# Patient Record
Sex: Male | Born: 1945
Health system: Southern US, Community
[De-identification: ages and names within clinical notes are randomized; demographics above are authoritative.]

## PROBLEM LIST (undated history)

## (undated) DIAGNOSIS — H409 Unspecified glaucoma: Secondary | ICD-10-CM

## (undated) DIAGNOSIS — E139 Other specified diabetes mellitus without complications: Secondary | ICD-10-CM

## (undated) DIAGNOSIS — K5790 Diverticulosis of intestine, part unspecified, without perforation or abscess without bleeding: Secondary | ICD-10-CM

## (undated) DIAGNOSIS — C4491 Basal cell carcinoma of skin, unspecified: Secondary | ICD-10-CM

## (undated) DIAGNOSIS — M1990 Unspecified osteoarthritis, unspecified site: Secondary | ICD-10-CM

## (undated) DIAGNOSIS — E785 Hyperlipidemia, unspecified: Secondary | ICD-10-CM

## (undated) DIAGNOSIS — K219 Gastro-esophageal reflux disease without esophagitis: Secondary | ICD-10-CM

## (undated) DIAGNOSIS — H11003 Unspecified pterygium of eye, bilateral: Secondary | ICD-10-CM

## (undated) DIAGNOSIS — I4891 Unspecified atrial fibrillation: Secondary | ICD-10-CM

## (undated) DIAGNOSIS — I739 Peripheral vascular disease, unspecified: Secondary | ICD-10-CM

## (undated) DIAGNOSIS — N400 Enlarged prostate without lower urinary tract symptoms: Secondary | ICD-10-CM

## (undated) DIAGNOSIS — M2041 Other hammer toe(s) (acquired), right foot: Secondary | ICD-10-CM

## (undated) DIAGNOSIS — I2510 Atherosclerotic heart disease of native coronary artery without angina pectoris: Secondary | ICD-10-CM

## (undated) DIAGNOSIS — K469 Unspecified abdominal hernia without obstruction or gangrene: Secondary | ICD-10-CM

## (undated) DIAGNOSIS — M2042 Other hammer toe(s) (acquired), left foot: Secondary | ICD-10-CM

## (undated) DIAGNOSIS — J449 Chronic obstructive pulmonary disease, unspecified: Secondary | ICD-10-CM

## (undated) DIAGNOSIS — I10 Essential (primary) hypertension: Secondary | ICD-10-CM

## (undated) DIAGNOSIS — D571 Sickle-cell disease without crisis: Secondary | ICD-10-CM

## (undated) DIAGNOSIS — E119 Type 2 diabetes mellitus without complications: Secondary | ICD-10-CM

## (undated) DIAGNOSIS — T7840XA Allergy, unspecified, initial encounter: Secondary | ICD-10-CM

## (undated) HISTORY — DX: Allergy, unspecified, initial encounter: T78.40XA

## (undated) HISTORY — DX: Unspecified abdominal hernia without obstruction or gangrene: K46.9

## (undated) HISTORY — DX: Hyperlipidemia, unspecified: E78.5

## (undated) HISTORY — PX: CARDIAC CATHETERIZATION: SHX172

## (undated) HISTORY — DX: Unspecified atrial fibrillation: I48.91

## (undated) HISTORY — DX: Other specified diabetes mellitus without complications: E13.9

## (undated) HISTORY — DX: Other hammer toe(s) (acquired), right foot: M20.41

## (undated) HISTORY — DX: Atherosclerotic heart disease of native coronary artery without angina pectoris: I25.10

## (undated) HISTORY — DX: Diverticulosis of intestine, part unspecified, without perforation or abscess without bleeding: K57.90

## (undated) HISTORY — DX: Benign prostatic hyperplasia without lower urinary tract symptoms: N40.0

## (undated) HISTORY — DX: Basal cell carcinoma of skin, unspecified: C44.91

## (undated) HISTORY — DX: Unspecified pterygium of eye, bilateral: H11.003

## (undated) HISTORY — DX: Sickle-cell disease without crisis: D57.1

## (undated) HISTORY — DX: Unspecified glaucoma: H40.9

## (undated) HISTORY — DX: Type 2 diabetes mellitus without complications: E11.9

## (undated) HISTORY — DX: Peripheral vascular disease, unspecified: I73.9

## (undated) HISTORY — DX: Essential (primary) hypertension: I10

## (undated) HISTORY — DX: Gastro-esophageal reflux disease without esophagitis: K21.9

## (undated) HISTORY — DX: Unspecified osteoarthritis, unspecified site: M19.90

## (undated) HISTORY — DX: Other hammer toe(s) (acquired), right foot: M20.42

## (undated) HISTORY — DX: Chronic obstructive pulmonary disease, unspecified: J44.9

---

## 2008-07-24 HISTORY — PX: INGUINAL HERNIA REPAIR: SHX194

## 2010-12-05 HISTORY — PX: CARDIAC CATHETERIZATION: SHX172

## 2011-10-11 ENCOUNTER — Encounter: Payer: Self-pay | Admitting: Cardiology

## 2012-07-12 ENCOUNTER — Encounter: Payer: Self-pay | Admitting: Cardiology

## 2012-07-12 ENCOUNTER — Ambulatory Visit (INDEPENDENT_AMBULATORY_CARE_PROVIDER_SITE_OTHER): Payer: BC Managed Care – PPO | Admitting: Cardiology

## 2012-07-12 VITALS — BP 169/78 | HR 59 | Ht 65.0 in | Wt 142.8 lb

## 2012-07-12 DIAGNOSIS — I2510 Atherosclerotic heart disease of native coronary artery without angina pectoris: Secondary | ICD-10-CM

## 2012-07-12 DIAGNOSIS — I10 Essential (primary) hypertension: Secondary | ICD-10-CM

## 2012-07-12 DIAGNOSIS — E785 Hyperlipidemia, unspecified: Secondary | ICD-10-CM

## 2012-07-12 MED ORDER — HYDROCHLOROTHIAZIDE 12.5 MG PO CAPS: 25.0000 mg | ORAL_CAPSULE | Freq: Every day | ORAL | Status: DC

## 2012-07-12 NOTE — Progress Notes (Signed)
HPI The patient presents as a new patient. He has moved from [place] originally but was living in [place] prior to this. He does have a history of coronary disease having undergone angioplasty earlier this year. I was able to review some of these records. He does not recall having symptoms. I am not exactly sure how he presented initially. I do note that he was found on catheterization to have an occluded right coronary artery and treated with high-grade LAD stenosis treated with a Promus stent.  He was also apparently found to have some peripheral vascular disease though I don't have these records. He has moved to [place].  He says that with activities he denies any symptoms. He has no chest discomfort, neck or arm discomfort. He has no palpitations, presyncope or syncope. He has no shortness of breath, PND or orthopnea. He has no weight gain or edema. He does have some leg pain when he ambulates a moderate distance.  No Known Allergies  Current Outpatient Prescriptions  Medication Sig Dispense Refill  . ASPIRIN CR PO Take 81 mg by mouth daily.       Marland Kitchen atorvastatin (LIPITOR) 20 MG tablet Take 20 mg by mouth daily.      . cetirizine (ZYRTEC) 10 MG tablet Take 10 mg by mouth daily.      . hydrochlorothiazide (MICROZIDE) 12.5 MG capsule Take 2 capsules (25 mg total) by mouth daily.  180 capsule  3  . losartan (COZAAR) 100 MG tablet Take 100 mg by mouth daily.      . metFORMIN (GLUCOPHAGE) 1000 MG tablet Take 1,000 mg by mouth 2 (two) times daily with a meal.      . Tamsulosin HCl (FLOMAX) 0.4 MG CAPS Take 0.4 mg by mouth daily after supper.      . Clopidogrel Bisulfate (PLAVIX PO) Take 75 mg by mouth daily.         Past Medical History  Diagnosis Date  . Coronary artery disease   . Diabetes mellitus without complication   . Dyslipidemia   . Hypertension   . Peripheral vascular disease   . Hernia   . Chest pain     Past Surgical History  Procedure Date  . Cardiac  catheterization   . Inguinal hernia repair     Right    No family history on file.  History   Social History  . Marital Status: Married    Spouse Name: N/A    Number of Children: 6  . Years of Education: N/A   Occupational History  . Not on file.   Social History Main Topics  . Smoking status: Former Smoker    Types: Cigarettes  . Smokeless tobacco: Not on file     Comment: Quit tobacco 2 years ago.    . Alcohol Use: Not on file  . Drug Use: Not on file  . Sexually Active: Not on file   Other Topics Concern  . Not on file   Social History Narrative   Five children living.  Lives with wife.      ROS:   Positive for rare nosebleed. Negative for all other systems.  PHYSICAL EXAM BP 169/78  Pulse 59  Ht 5\' 5"  (1.651 m)  Wt 142 lb 12.8 oz (64.774 kg)  BMI 23.76 kg/m2 GENERAL:  Well appearing HEENT:  Pupils equal round and reactive, fundi not visualized, oral mucosa unremarkable NECK:  No jugular venous distention, waveform within normal limits, carotid upstroke brisk and symmetric,  no bruits, no thyromegaly LYMPHATICS:  No cervical, inguinal adenopathy LUNGS:  Clear to auscultation bilaterally BACK:  No CVA tenderness CHEST:  Unremarkable HEART:  PMI not displaced or sustained,S1 and S2 within normal limits, no S3, no S4, no clicks, no rubs, no murmurs ABD:  Flat, positive bowel sounds normal in frequency in pitch, no bruits, no rebound, no guarding, no midline pulsatile mass, no hepatomegaly, no splenomegaly EXT:  2 plus pulses upper, mildly diminished dorsalis pedis and posterior tibialis bilaterally, no edema, no cyanosis no clubbing SKIN:  No rashes no nodules NEURO:  Cranial nerves II through XII grossly intact, motor grossly intact throughout PSYCH:  Cognitively intact, oriented to person place and time   EKG:  Sinus bradycardia, rate 59, sinus arrhythmia, but ventricular hypertrophy by voltage criteria, no acute ST-T wave changes. 07/12/2012  ASSESSMENT  AND PLAN  CAD  The patient is not having active symptoms. No further cardiovascular testing is suggested. He will continue with aggressive risk reduction.  Dyslipidemia He will come back in about 10 days for a lipid profile with a goal LDL less than 100 and HDL greater than 40.  HTN I will increase his hydrochlorothiazide to 25 mg daily. He will get a basic metabolic profile in 10 days.  PVD I don't know the details of this and I will first prescribe a walking regimen.

## 2012-07-12 NOTE — Patient Instructions (Addendum)
Please increase your Hydrochlorothiazide to 25 mg a day (2 a day) Continue all other medications as listed.  Please return fasting in 10 days for blood work (lipid and BMP)  Follow in 2 months with Dr Antoine Poche.

## 2012-07-22 ENCOUNTER — Other Ambulatory Visit (INDEPENDENT_AMBULATORY_CARE_PROVIDER_SITE_OTHER): Payer: BC Managed Care – PPO

## 2012-07-22 DIAGNOSIS — E785 Hyperlipidemia, unspecified: Secondary | ICD-10-CM

## 2012-07-22 DIAGNOSIS — I2510 Atherosclerotic heart disease of native coronary artery without angina pectoris: Secondary | ICD-10-CM

## 2012-07-22 DIAGNOSIS — I10 Essential (primary) hypertension: Secondary | ICD-10-CM

## 2012-07-22 LAB — BASIC METABOLIC PANEL
BUN: 16 mg/dL (ref 6–23)
CO2: 27 mEq/L (ref 19–32)
Calcium: 9.5 mg/dL (ref 8.4–10.5)
Creatinine, Ser: 1.6 mg/dL — ABNORMAL HIGH (ref 0.4–1.5)
Glucose, Bld: 192 mg/dL — ABNORMAL HIGH (ref 70–99)

## 2012-07-22 LAB — LIPID PANEL
LDL Cholesterol: 81 mg/dL (ref 0–99)
Total CHOL/HDL Ratio: 3

## 2012-07-29 ENCOUNTER — Encounter: Payer: Self-pay | Admitting: Cardiology

## 2012-08-08 ENCOUNTER — Ambulatory Visit (INDEPENDENT_AMBULATORY_CARE_PROVIDER_SITE_OTHER): Payer: BC Managed Care – PPO | Admitting: Internal Medicine

## 2012-08-08 ENCOUNTER — Encounter: Payer: Self-pay | Admitting: Internal Medicine

## 2012-08-08 ENCOUNTER — Other Ambulatory Visit (INDEPENDENT_AMBULATORY_CARE_PROVIDER_SITE_OTHER): Payer: BC Managed Care – PPO

## 2012-08-08 VITALS — BP 140/72 | HR 80 | Temp 97.8°F | Resp 16 | Ht 65.0 in | Wt 140.0 lb

## 2012-08-08 DIAGNOSIS — Z0000 Encounter for general adult medical examination without abnormal findings: Secondary | ICD-10-CM | POA: Insufficient documentation

## 2012-08-08 DIAGNOSIS — I10 Essential (primary) hypertension: Secondary | ICD-10-CM

## 2012-08-08 DIAGNOSIS — I2510 Atherosclerotic heart disease of native coronary artery without angina pectoris: Secondary | ICD-10-CM

## 2012-08-08 DIAGNOSIS — E1129 Type 2 diabetes mellitus with other diabetic kidney complication: Secondary | ICD-10-CM

## 2012-08-08 DIAGNOSIS — E1165 Type 2 diabetes mellitus with hyperglycemia: Secondary | ICD-10-CM

## 2012-08-08 DIAGNOSIS — E139 Other specified diabetes mellitus without complications: Secondary | ICD-10-CM | POA: Insufficient documentation

## 2012-08-08 LAB — URINALYSIS, ROUTINE W REFLEX MICROSCOPIC
Ketones, ur: NEGATIVE
Specific Gravity, Urine: 1.01 (ref 1.000–1.030)
Total Protein, Urine: NEGATIVE
Urine Glucose: NEGATIVE
pH: 6 (ref 5.0–8.0)

## 2012-08-08 LAB — CBC WITH DIFFERENTIAL/PLATELET
Basophils Relative: 0.4 % (ref 0.0–3.0)
Eosinophils Absolute: 0.3 10*3/uL (ref 0.0–0.7)
Hemoglobin: 14.1 g/dL (ref 13.0–17.0)
MCHC: 33.6 g/dL (ref 30.0–36.0)
MCV: 87.2 fl (ref 78.0–100.0)
Monocytes Absolute: 0.5 10*3/uL (ref 0.1–1.0)
Neutro Abs: 3 10*3/uL (ref 1.4–7.7)
Neutrophils Relative %: 52.3 % (ref 43.0–77.0)
RBC: 4.81 Mil/uL (ref 4.22–5.81)

## 2012-08-08 LAB — COMPREHENSIVE METABOLIC PANEL
AST: 27 U/L (ref 0–37)
Alkaline Phosphatase: 74 U/L (ref 39–117)
BUN: 22 mg/dL (ref 6–23)
Creatinine, Ser: 1.4 mg/dL (ref 0.4–1.5)
Total Bilirubin: 0.8 mg/dL (ref 0.3–1.2)

## 2012-08-08 LAB — MICROALBUMIN / CREATININE URINE RATIO
Microalb Creat Ratio: 4.9 mg/g (ref 0.0–30.0)
Microalb, Ur: 5.2 mg/dL — ABNORMAL HIGH (ref 0.0–1.9)

## 2012-08-08 LAB — PSA: PSA: 3.27 ng/mL (ref 0.10–4.00)

## 2012-08-08 NOTE — Progress Notes (Signed)
Subjective:    Patient ID: Steven [name], male    DOB: 08-Nov-1945, 67 y.o.   MRN: [id number]  Diabetes He presents for his follow-up diabetic visit. He has type 2 diabetes mellitus. His disease course has been stable. There are no hypoglycemic associated symptoms. Pertinent negatives for hypoglycemia include no dizziness or pallor. Associated symptoms include polydipsia, polyphagia and polyuria. Pertinent negatives for diabetes include no blurred vision, no chest pain, no fatigue, no foot paresthesias, no foot ulcerations, no visual change, no weakness and no weight loss. There are no hypoglycemic complications. Symptoms are worsening. Diabetic complications include heart disease. Current diabetic treatment includes oral agent (monotherapy). He is compliant with treatment most of the time. His weight is stable. He is following a generally healthy diet. Meal planning includes avoidance of concentrated sweets. He has not had a previous visit with a dietician. He participates in exercise intermittently. There is no change in his home blood glucose trend. An ACE inhibitor/angiotensin II receptor blocker is being taken. He does not see a podiatrist.Eye exam is not current.      Review of Systems  Constitutional: Negative for fever, chills, weight loss, diaphoresis, activity change, appetite change, fatigue and unexpected weight change.  HENT: Negative.   Eyes: Negative.  Negative for blurred vision.  Respiratory: Negative for apnea, cough, choking, chest tightness, shortness of breath, wheezing and stridor.   Cardiovascular: Negative for chest pain, palpitations and leg swelling.  Gastrointestinal: Negative for nausea, vomiting, abdominal pain, diarrhea, constipation and blood in stool.  Genitourinary: Positive for polyuria. Negative for dysuria, urgency, frequency, hematuria, flank pain, decreased urine volume, penile swelling, scrotal swelling, enuresis, difficulty urinating and testicular pain.    Musculoskeletal: Negative for myalgias, back pain, joint swelling, arthralgias and gait problem.  Skin: Negative for color change, pallor, rash and wound.  Neurological: Negative.  Negative for dizziness, weakness and light-headedness.  Hematological: Positive for polydipsia and polyphagia. Negative for adenopathy. Does not bruise/bleed easily.  Psychiatric/Behavioral: Negative.        Objective:   Physical Exam  Vitals reviewed. Constitutional: He is oriented to person, place, and time. He appears well-developed and well-nourished.  Non-toxic appearance. He does not have a sickly appearance. He does not appear ill. No distress.  HENT:  Head: Normocephalic and atraumatic.  Mouth/Throat: Oropharynx is clear and moist. No oropharyngeal exudate.  Eyes: Conjunctivae normal are normal. Right eye exhibits no discharge. Left eye exhibits no discharge. No scleral icterus.  Neck: Normal range of motion. Neck supple. No JVD present. No tracheal deviation present. No thyromegaly present.  Cardiovascular: Normal rate, regular rhythm, normal heart sounds and intact distal pulses.  Exam reveals no gallop and no friction rub.   No murmur heard. Pulmonary/Chest: Effort normal and breath sounds normal. No stridor. No respiratory distress. He has no wheezes. He has no rales. He exhibits no tenderness.  Abdominal: Soft. Bowel sounds are normal. He exhibits no distension and no mass. There is no tenderness. There is no rebound and no guarding. Hernia confirmed negative in the right inguinal area and confirmed negative in the left inguinal area.  Genitourinary: Testes normal and penis normal. Rectal exam shows external hemorrhoid. Rectal exam shows no internal hemorrhoid, no fissure, no mass, no tenderness and anal tone normal. Guaiac negative stool. 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 Prostate is not tender. Right testis shows no mass, no swelling and no tenderness. Right testis is descended. Left  testis shows no mass, no swelling and no tenderness. Left testis is  descended. Circumcised. No penile erythema or penile tenderness. No discharge found.  Musculoskeletal: Normal range of motion. He exhibits no edema and no tenderness.  Lymphadenopathy:    He has no cervical adenopathy.       Right: No inguinal adenopathy present.       Left: No inguinal adenopathy present.  Neurological: He is oriented to person, place, and time.  Skin: Skin is warm and dry. No rash noted. He is not diaphoretic. No erythema. No pallor.  Psychiatric: He has a normal mood and affect. His behavior is normal. Judgment and thought content normal.     Lab Results  Component Value Date   GLUCOSE 192* 07/22/2012   CHOL 177 07/22/2012   TRIG 164.0* 07/22/2012   HDL 62.90 07/22/2012   LDLCALC 81 07/22/2012   NA 138 07/22/2012   K 3.9 07/22/2012   CL 101 07/22/2012   CREATININE 1.6* 07/22/2012   BUN 16 07/22/2012   CO2 27 07/22/2012       Assessment & Plan:

## 2012-08-08 NOTE — Assessment & Plan Note (Signed)
His BP is well controlled I will check his lytes and renal function 

## 2012-08-08 NOTE — Patient Instructions (Signed)
Health Maintenance, Males A healthy lifestyle and preventative care can promote health and wellness.  Maintain regular health, dental, and eye exams.  Eat a healthy diet. Foods like vegetables, fruits, whole grains, low-fat dairy products, and lean protein foods contain the nutrients you need without too many calories. Decrease your intake of foods high in solid fats, added sugars, and salt. Get information about a proper diet from your caregiver, if necessary.  Regular physical exercise is one of the most important things you can do for your health. Most adults should get at least 150 minutes of moderate-intensity exercise (any activity that increases your heart rate and causes you to sweat) each week. In addition, most adults need muscle-strengthening exercises on 2 or more days a week.   Maintain a healthy weight. The body mass index (BMI) is a screening tool to identify possible weight problems. It provides an estimate of body fat based on height and weight. Your caregiver can help determine your BMI, and can help you achieve or maintain a healthy weight. For adults 20 years and older:  A BMI below 18.5 is considered underweight.  A BMI of 18.5 to 24.9 is normal.  A BMI of 25 to 29.9 is considered overweight.  A BMI of 30 and above is considered obese.  Maintain normal blood lipids and cholesterol by exercising and minimizing your intake of saturated fat. Eat a balanced diet with plenty of fruits and vegetables. Blood tests for lipids and cholesterol should begin at age 20 and be repeated every 5 years. If your lipid or cholesterol levels are high, you are over 50, or you are a high risk for heart disease, you may need your cholesterol levels checked more frequently.Ongoing high lipid and cholesterol levels should be treated with medicines, if diet and exercise are not effective.  If you smoke, find out from your caregiver how to quit. If you do not use tobacco, do not start.  If you  choose to drink alcohol, do not exceed 2 drinks per day. One drink is considered to be 12 ounces (355 mL) of beer, 5 ounces (148 mL) of wine, or 1.5 ounces (44 mL) of liquor.  Avoid use of street drugs. Do not share needles with anyone. Ask for help if you need support or instructions about stopping the use of drugs.  High blood pressure causes heart disease and increases the risk of stroke. Blood pressure should be checked at least every 1 to 2 years. Ongoing high blood pressure should be treated with medicines if weight loss and exercise are not effective.  If you are 45 to 65 years old, ask your caregiver if you should take aspirin to prevent heart disease.  Diabetes screening involves taking a blood sample to check your fasting blood sugar level. This should be done once every 3 years, after age 45, if you are within normal weight and without risk factors for diabetes. Testing should be considered at a younger age or be carried out more frequently if you are overweight and have at least 1 risk factor for diabetes.  Colorectal cancer can be detected and often prevented. Most routine colorectal cancer screening begins at the age of 50 and continues through age 75. However, your caregiver may recommend screening at an earlier age if you have risk factors for colon cancer. On a yearly basis, your caregiver may provide home test kits to check for hidden blood in the stool. Use of a small camera at the end of a tube,   to directly examine the colon (sigmoidoscopy or colonoscopy), can detect the earliest forms of colorectal cancer. Talk to your caregiver about this at age 50, when routine screening begins. Direct examination of the colon should be repeated every 5 to 10 years through age 75, unless early forms of pre-cancerous polyps or small growths are found.  Hepatitis C blood testing is recommended for all people born from 1945 through 1965 and any individual with known risks for hepatitis C.  Healthy  men should no longer receive prostate-specific antigen (PSA) blood tests as part of routine cancer screening. Consult with your caregiver about prostate cancer screening.  Testicular cancer screening is not recommended for adolescents or adult males who have no symptoms. Screening includes self-exam, caregiver exam, and other screening tests. Consult with your caregiver about any symptoms you have or any concerns you have about testicular cancer.  Practice safe sex. Use condoms and avoid high-risk sexual practices to reduce the spread of sexually transmitted infections (STIs).  Use sunscreen with a sun protection factor (SPF) of 30 or greater. Apply sunscreen liberally and repeatedly throughout the day. You should seek shade when your shadow is shorter than you. Protect yourself by wearing long sleeves, pants, a wide-brimmed hat, and sunglasses year round, whenever you are outdoors.  Notify your caregiver of new moles or changes in moles, especially if there is a change in shape or color. Also notify your caregiver if a mole is larger than the size of a pencil eraser.  A one-time screening for abdominal aortic aneurysm (AAA) and surgical repair of large AAAs by sound wave imaging (ultrasonography) is recommended for ages 65 to 75 years who are current or former smokers.  Stay current with your immunizations. Document Released: 01/06/2008 Document Revised: 10/02/2011 Document Reviewed: 12/05/2010 [employer] Patient Information 2013 [employer]. Diabetes, Type 2 Diabetes is a long-lasting (chronic) disease. In type 2 diabetes, the pancreas does not make enough insulin (a hormone), and the body does not respond normally to the insulin that is made. This type of diabetes was also previously called adult-onset diabetes. It usually occurs after the age of 40, but it can occur at any age.  CAUSES  Type 2 diabetes happens because the pancreasis not making enough insulin or your body has trouble using  the insulin that your pancreas does make properly. SYMPTOMS   Drinking more than usual.  Urinating more than usual.  Blurred vision.  Dry, itchy skin.  Frequent infections.  Feeling more tired than usual (fatigue). DIAGNOSIS The diagnosis of type 2 diabetes is usually made by one of the following tests:  Fasting blood glucose test. You will not eat for at least 8 hours and then take a blood test.  Random blood glucose test. Your blood glucose (sugar) is checked at any time of the day regardless of when you ate.  Oral glucose tolerance test (OGTT). Your blood glucose is measured after you have not eaten (fasted) and then after you drink a glucose containing beverage. TREATMENT   Healthy eating.  Exercise.  Medicine, if needed.  Monitoring blood glucose.  Seeing your caregiver regularly. HOME CARE INSTRUCTIONS   Check your blood glucose at least once a day. More frequent monitoring may be necessary, depending on your medicines and on how well your diabetes is controlled. Your caregiver will advise you.  Take your medicine as directed by your caregiver.  Do not smoke.  Make wise food choices. Ask your caregiver for information. Weight loss can improve your diabetes.    Learn about low blood glucose (hypoglycemia) and how to treat it.  Get your eyes checked regularly.  Have a yearly physical exam. Have your blood pressure checked and your blood and urine tested.  Wear a pendant or bracelet saying that you have diabetes.  Check your feet every night for cuts, sores, blisters, and redness. Let your caregiver know if you have any problems. SEEK MEDICAL CARE IF:   You have problems keeping your blood glucose in target range.  You have problems with your medicines.  You have symptoms of an illness that do not improve after 24 hours.  You have a sore or wound that is not healing.  You notice a change in vision or a new problem with your vision.  You have a  fever. MAKE SURE YOU:  Understand these instructions.  Will watch your condition.  Will get help right away if you are not doing well or get worse. Document Released: 07/10/2005 Document Revised: 10/02/2011 Document Reviewed: 12/26/2010 [employer] Patient Information 2013 [employer].  

## 2012-08-08 NOTE — Assessment & Plan Note (Signed)
I will check his a1c and c-peptide and will make changes if needed He was referred for an eye exam

## 2012-08-08 NOTE — Assessment & Plan Note (Addendum)
Exam done Vaccines were updated Labs ordered Pt ed material was given He was referred for a screening colonoscopy

## 2012-08-09 ENCOUNTER — Encounter: Payer: Self-pay | Admitting: Internal Medicine

## 2012-08-09 LAB — C-PEPTIDE: C-Peptide: 0.92 ng/mL (ref 0.80–3.90)

## 2012-08-14 ENCOUNTER — Ambulatory Visit (INDEPENDENT_AMBULATORY_CARE_PROVIDER_SITE_OTHER): Payer: BC Managed Care – PPO | Admitting: Internal Medicine

## 2012-08-14 ENCOUNTER — Encounter: Payer: Self-pay | Admitting: Internal Medicine

## 2012-08-14 VITALS — BP 142/58 | HR 70 | Temp 97.2°F | Resp 16 | Wt 145.5 lb

## 2012-08-14 DIAGNOSIS — E139 Other specified diabetes mellitus without complications: Secondary | ICD-10-CM

## 2012-08-14 DIAGNOSIS — E119 Type 2 diabetes mellitus without complications: Secondary | ICD-10-CM

## 2012-08-14 DIAGNOSIS — I10 Essential (primary) hypertension: Secondary | ICD-10-CM

## 2012-08-14 LAB — HM DIABETES FOOT EXAM: HM Diabetic Foot Exam: NORMAL

## 2012-08-14 MED ORDER — INSULIN PEN NEEDLE 32G X 4 MM MISC: 1.0000 | Freq: Every day | Status: DC

## 2012-08-14 MED ORDER — GLUCOSE BLOOD VI STRP: ORAL_STRIP | Status: DC

## 2012-08-14 MED ORDER — INSULIN DETEMIR 100 UNIT/ML ~~LOC~~ SOLN: 40.0000 [IU] | Freq: Every day | SUBCUTANEOUS | Status: DC

## 2012-08-14 NOTE — Patient Instructions (Signed)
Diabetes, Type 1 Diabetes is a long-term (chronic) disease. It occurs when the cells in the pancreas that make insulin (a hormone) are destroyed and can no longer make insulin. Type 1 diabetes was also previously called juvenile-onset diabetes. It most often occurs before the age of 30, but it can also occur in older people. CAUSES  Among other factors, the following may cause type 1 diabetes:  Genetics. This means it may be passed to you by your parents.  The beta cells that make insulin are destroyed. The cause of this is unknown. SYMPTOMS   Urinating more than usual (or bed-wetting in children).  Drinking more than usual.  Irritability.  Feeling very hungry.  Weight loss (may be rapid).  Nausea and vomiting.  Abdominal pain.  Feeling more tired than usual (fatigue).  Rapid breathing.  Difficulty staying awake.  Night sweats. DIAGNOSIS  Your blood is tested to determine whether you have type 1 diabetes. TREATMENT   You will need to check your blood glucose (sugar) levels several times a day.  You will need to balance insulin, a healthy meal plan, and exercise to maintain normal blood glucose.  Education and ongoing support is recommended.  You should have regular checkups and immunizations. HOME CARE INSTRUCTIONS   Never run out of insulin. It is needed every day.  Do not skip insulin doses.  Do not skip meals. Eat healthy.  Follow your treatment and monitoring plan.  Wear a pendant or bracelet stating you have diabetes and take insulin.  If you start a new exercise or sport, watch for low blood glucose (hypoglycemia) symptoms. Insulin dosing may need to be adjusted.  Follow up with your caregiver regularly.  Tell your workplace or school about your diabetes treatment plan. SEEK MEDICAL CARE IF:   You have problems keeping your blood glucose in target range.  You have blood glucose readings that are often too high or too low.  You have problems with  your medicines.  You have symptoms of an illness that do not improve after 24 hours.  You have a sore or wound that is not healing.  You notice a change in vision or a new problem with your vision.  You have a fever. MAKE SURE YOU:  Understand these instructions.  Will watch your condition.  Will get help right away if you are not doing well or get worse. Document Released: 07/07/2000 Document Revised: 10/02/2011 Document Reviewed: 12/26/2010 [employer] Patient Information 2013 [employer].  

## 2012-08-14 NOTE — Progress Notes (Signed)
Subjective:    Patient ID: Steven [name], male    DOB: [birth year], 67 y.o.   MRN: [id number]  Diabetes He presents for his follow-up diabetic visit. He has type 1 diabetes mellitus. There are no hypoglycemic associated symptoms. Pertinent negatives for hypoglycemia include no dizziness. Associated symptoms include polydipsia, polyphagia and polyuria. Pertinent negatives for diabetes include no blurred vision, no chest pain, no fatigue, no foot paresthesias, no foot ulcerations, no visual change, no weakness and no weight loss. There are no hypoglycemic complications. Symptoms are stable. There are no diabetic complications. Current diabetic treatment includes oral agent (monotherapy). He is compliant with treatment most of the time. His weight is stable. He is following a generally unhealthy diet. When asked about meal planning, he reported none. He participates in exercise intermittently. His home blood glucose trend is increasing steadily. His breakfast blood glucose range is generally 180-200 mg/dl. His lunch blood glucose range is generally >200 mg/dl. His dinner blood glucose range is generally >200 mg/dl. His highest blood glucose is >200 mg/dl. His overall blood glucose range is >200 mg/dl. An ACE inhibitor/angiotensin II receptor blocker is being taken. He does not see a podiatrist.Eye exam is not current.      Review of Systems  Constitutional: Negative.  Negative for weight loss and fatigue.  HENT: Negative.   Eyes: Negative.  Negative for blurred vision.  Respiratory: Negative for cough, chest tightness, shortness of breath, wheezing and stridor.   Cardiovascular: Negative for chest pain, palpitations and leg swelling.  Gastrointestinal: Negative for nausea, vomiting, abdominal pain and diarrhea.  Genitourinary: Positive for polyuria and frequency.  Musculoskeletal: Negative.  Negative for myalgias, back pain, joint swelling, arthralgias and gait problem.  Skin: Negative.     Neurological: Negative.  Negative for dizziness, weakness and light-headedness.  Hematological: Positive for polydipsia and polyphagia. Negative for adenopathy. Does not bruise/bleed easily.  Psychiatric/Behavioral: Negative.        Objective:   Physical Exam  Vitals reviewed. Constitutional: He is oriented to person, place, and time. He appears well-developed and well-nourished. No distress.  HENT:  Head: Normocephalic and atraumatic.  Mouth/Throat: Oropharynx is clear and moist. No oropharyngeal exudate.  Eyes: Conjunctivae normal are normal. Right eye exhibits no discharge. Left eye exhibits no discharge. No scleral icterus.  Neck: Normal range of motion. Neck supple. No JVD present. No tracheal deviation present. No thyromegaly present.  Cardiovascular: Normal rate, regular rhythm, normal heart sounds and intact distal pulses.  Exam reveals no gallop and no friction rub.   No murmur heard. Pulmonary/Chest: Effort normal and breath sounds normal. No stridor. No respiratory distress. He has no wheezes. He has no rales. He exhibits no tenderness.  Abdominal: Soft. Bowel sounds are normal. He exhibits no distension and no mass. There is no tenderness. There is no rebound and no guarding.  Musculoskeletal: Normal range of motion. He exhibits no edema and no tenderness.  Lymphadenopathy:    He has no cervical adenopathy.  Neurological: He is oriented to person, place, and time.  Skin: Skin is warm and dry. No rash noted. He is not diaphoretic. No erythema. No pallor.  Psychiatric: He has a normal mood and affect. His behavior is normal. Judgment and thought content normal.      Lab Results  Component Value Date   WBC 5.7 08/08/2012   HGB 14.1 08/08/2012   HCT 41.9 08/08/2012   PLT 159.0 08/08/2012   GLUCOSE 255* 08/08/2012   CHOL 177 07/22/2012   TRIG 164.0* 07/22/2012  HDL 62.90 07/22/2012   LDLCALC 81 07/22/2012   ALT 22 08/08/2012   AST 27 08/08/2012   NA 136 08/08/2012   K 3.8  08/08/2012   CL 99 08/08/2012   CREATININE 1.4 08/08/2012   BUN 22 08/08/2012   CO2 29 08/08/2012   TSH 1.11 08/08/2012   PSA 3.27 08/08/2012   HGBA1C 9.1* 08/08/2012   MICROALBUR 5.2* 08/08/2012      Assessment & Plan:

## 2012-08-14 NOTE — Assessment & Plan Note (Signed)
His BP is well controlled 

## 2012-08-14 NOTE — Assessment & Plan Note (Signed)
He needs to start insulin- levemir first dose given in the office today He will see the diabetic educator and get his eye exam done

## 2012-08-15 ENCOUNTER — Telehealth: Payer: Self-pay | Admitting: Internal Medicine

## 2012-08-15 ENCOUNTER — Encounter: Payer: Self-pay | Admitting: Internal Medicine

## 2012-08-15 NOTE — Telephone Encounter (Signed)
Called back spoke with pt gave # to [facility]/diabetic...lmb

## 2012-08-15 NOTE — Telephone Encounter (Signed)
Steven [name] returning a call from the office regarding Diabetic education classes.  She needs the phone number for the referral.

## 2012-08-26 ENCOUNTER — Telehealth: Payer: Self-pay | Admitting: Internal Medicine

## 2012-08-26 NOTE — Telephone Encounter (Signed)
[facility] Triage Call Report Triage Record Num: [id number] Operator: Thayer Headings Patient Name: Steven [name] Call Date & Time: 08/25/2012 1:59:31PM Patient Phone: [phone] PCP: Sanda Linger Patient Gender: Male PCP Fax : Patient DOB: 1945/09/20 Practice Name: [facility] Reason for Call: Caller: Cynthia/Spouse; PCP: Sanda Linger (Adults only); CB#: [phone]; Calling today 08/25/12 regarding they are in [facility] [place] and he forgot his insulin pens. Said to call this to [facility] [address], [place] [phone]. Per chart in EPIC, medication order is Levimir Flexpen 100 Units/ml, inject 40 Units into skin at bedtime subqutaneous. Triager contacted pharmacy and spoke with Duwayne Heck, pharmacist and she said 1 pen should last him 7 days. Advised to dispense 1 pen, no refills. Protocol(s) Used: Office Note Recommended Outcome per Protocol: Information Noted and Sent to Office Reason for Outcome: Caller information to office

## 2012-09-04 ENCOUNTER — Telehealth: Payer: Self-pay | Admitting: Cardiology

## 2012-09-04 NOTE — Telephone Encounter (Signed)
Walk In Pt Form " Application for Disability Pla-Card" gave to [facility] 09/03/12/KM

## 2012-09-09 ENCOUNTER — Telehealth: Payer: Self-pay | Admitting: Internal Medicine

## 2012-09-09 ENCOUNTER — Encounter: Payer: Self-pay | Admitting: Internal Medicine

## 2012-09-09 MED ORDER — ACCU-CHEK SOFT TOUCH LANCETS MISC: Status: DC

## 2012-09-09 NOTE — Telephone Encounter (Signed)
Rx called in to local [facility] and also sent to request mail order company with refills.

## 2012-09-09 NOTE — Telephone Encounter (Signed)
[facility] Triage Call Report Triage Record Num: [id number] Operator: Remonia Richter Patient Name: Steven [name] Call Date & Time: 09/04/2012 5:30:01PM Patient Phone: [phone] PCP: Sanda Linger Patient Gender: Male PCP Fax : Patient DOB: [birth year] [facility] Name[facility] Reason for Call: Caller: [name]/Patient; PCP: Sanda Linger (Adults only); CB#: [phone]; Call regarding his insulin,he is out of the lansets to prick his finger with for home glucose monitoring and not called in by office, uses a pharmacy in [place] and needs it called to mail order pharmacy, but will use [facility] at [phone] until office can call his mail order company, one box lancets called in per request to listed pharmacy with instruction to recall office when open for further needs Protocol(s) Used: Medication Questions - Adult Recommended Outcome per Protocol: Speak with Provider or Pharmacist within 24 hours Reason for Outcome: Requests refill of prescribed medication with valid refills; lack of medications does not put patient at clinical risk

## 2012-09-10 ENCOUNTER — Telehealth: Payer: Self-pay | Admitting: Gastroenterology

## 2012-09-10 ENCOUNTER — Ambulatory Visit (INDEPENDENT_AMBULATORY_CARE_PROVIDER_SITE_OTHER): Payer: BC Managed Care – PPO | Admitting: Internal Medicine

## 2012-09-10 ENCOUNTER — Encounter: Payer: Self-pay | Admitting: Internal Medicine

## 2012-09-10 VITALS — BP 130/60 | HR 64 | Ht 65.0 in | Wt 152.2 lb

## 2012-09-10 DIAGNOSIS — K219 Gastro-esophageal reflux disease without esophagitis: Secondary | ICD-10-CM | POA: Insufficient documentation

## 2012-09-10 DIAGNOSIS — Z1211 Encounter for screening for malignant neoplasm of colon: Secondary | ICD-10-CM

## 2012-09-10 DIAGNOSIS — K644 Residual hemorrhoidal skin tags: Secondary | ICD-10-CM

## 2012-09-10 DIAGNOSIS — Z5181 Encounter for therapeutic drug level monitoring: Secondary | ICD-10-CM

## 2012-09-10 DIAGNOSIS — Z7902 Long term (current) use of antithrombotics/antiplatelets: Secondary | ICD-10-CM

## 2012-09-10 MED ORDER — PEG-KCL-NACL-NASULF-NA ASC-C 100 G PO SOLR: 1.0000 | Freq: Once | ORAL | Status: DC

## 2012-09-10 MED ORDER — HYDROCORTISONE 2.5 % RE CREA: TOPICAL_CREAM | Freq: Two times a day (BID) | RECTAL | Status: DC

## 2012-09-10 NOTE — Progress Notes (Signed)
Patient ID: Steven [name], male   DOB: [birth year], 67 y.o.   MRN: [id number]  SUBJECTIVE: HPI [name] is a 67 year old male with a past medical history of coronary artery disease status post PCI with drug-eluting stent in May 2012, diabetes, hypertension, dyslipidemia, and peripheral vascular disease who is seen in consultation at the request of Dr. Yetta Barre for evaluation of screening colonoscopy. Accompanied today by his wife. He does remain on Plavix once daily. He reports a previous colonoscopy greater than 10 years ago which he recalls as normal. He was told to followup in 10 years, and reports he is "overdue". He does report occasional issues with external hemorrhoids. He describes these as painful with burning and occasional bright red blood on the toilet tissue.  He is using over-the-counter Preparation H for this in the past. He has 2-3 soft but formed stools daily without melena. He denies abdominal pain, but does report occasional abdominal bloating. He is having intermittent issues with heartburn characterized by water brash and burning epigastric pain. No nausea or vomiting. Good appetite. No early satiety. No recent chest pain or dyspnea.  Review of Systems  As per history of present illness, otherwise negative   Past Medical History  Diagnosis Date  . Coronary artery disease   . Diabetes mellitus without complication   . Dyslipidemia   . Hypertension   . Peripheral vascular disease   . Hernia   . Chest pain   . Heart murmur     Current Outpatient Prescriptions  Medication Sig Dispense Refill  . ASPIRIN CR PO Take 81 mg by mouth daily.       Marland Kitchen atorvastatin (LIPITOR) 20 MG tablet Take 20 mg by mouth daily.      . cetirizine (ZYRTEC) 10 MG tablet Take 10 mg by mouth daily.      Marland Kitchen Clopidogrel Bisulfate (PLAVIX PO) Take 75 mg by mouth daily.       Marland Kitchen glucose blood (ACCU-CHEK COMFORT CURVE) test strip Test up to twice daily Dx:250.42  300 each  4  . hydrochlorothiazide (MICROZIDE)  12.5 MG capsule Take 2 capsules (25 mg total) by mouth daily.  180 capsule  3  . insulin detemir (LEVEMIR FLEXPEN) 100 UNIT/ML injection Inject 40 Units into the skin at bedtime.  9 mL  12  . Insulin Pen Needle 32G X 4 MM MISC 1 Act by Does not apply route daily.  100 each  3  . Lancets (ACCU-CHEK SOFT TOUCH) lancets Test up to BID. Dx:250.42  100 each  4  . losartan (COZAAR) 100 MG tablet Take 100 mg by mouth daily.      . metFORMIN (GLUCOPHAGE) 1000 MG tablet Take 1,000 mg by mouth 2 (two) times daily with a meal.      . Tamsulosin HCl (FLOMAX) 0.4 MG CAPS Take 0.4 mg by mouth daily after supper.      . hydrocortisone (ANUSOL-HC) 2.5 % rectal cream Place rectally 2 (two) times daily.  30 g  0  . peg 3350 powder (MOVIPREP) 100 G SOLR Take 1 kit (100 g total) by mouth once.  1 kit  0   No current facility-administered medications for this visit.    No Known Allergies  Family History  Problem Relation Age of Onset  . Diabetes Mother   . Hypertension Mother   . Alcohol abuse Neg Hx   . Cancer Neg Hx   . Depression Neg Hx   . Early death Neg Hx   . Heart  disease Neg Hx   . Hyperlipidemia Neg Hx   . Kidney disease Neg Hx   . Stroke Neg Hx     History  Substance Use Topics  . Smoking status: Former Smoker    Types: Cigarettes  . Smokeless tobacco: Never Used     Comment: Quit tobacco 2 years ago.    . Alcohol Use: 1.8 oz/week    3 Cans of beer per week    OBJECTIVE: BP 130/60  Pulse 64  Ht 5\' 5"  (1.651 m)  Wt 152 lb 3 oz (69.032 kg)  BMI 25.33 kg/m2 Constitutional: Well-developed and well-nourished. No distress. HEENT: Normocephalic and atraumatic. Oropharynx is clear and moist. No oropharyngeal exudate. Conjunctivae are normal. No scleral icterus. Neck: Neck supple. Trachea midline. Cardiovascular: Normal rate, regular rhythm and intact distal pulses. No M/R/G Pulmonary/chest: Effort normal and breath sounds normal. No wheezing, rales or rhonchi. Abdominal: Soft,  nontender, mild distention with mild increase in tympany. Bowel sounds active throughout. There are no masses palpable. No hepatosplenomegaly. Extremities: no clubbing, cyanosis, or edema Neurological: Alert and oriented to person place and time. Skin: Skin is warm and dry. No rashes noted. Psychiatric: Normal mood and affect. Behavior is normal.  Labs and Imaging -- CBC    Component Value Date/Time   WBC 5.7 08/08/2012 1015   RBC 4.81 08/08/2012 1015   HGB 14.1 08/08/2012 1015   HCT 41.9 08/08/2012 1015   PLT 159.0 08/08/2012 1015   MCV 87.2 08/08/2012 1015   MCHC 33.6 08/08/2012 1015   RDW 14.1 08/08/2012 1015   LYMPHSABS 1.8 08/08/2012 1015   MONOABS 0.5 08/08/2012 1015   EOSABS 0.3 08/08/2012 1015   BASOSABS 0.0 08/08/2012 1015    CMP     Component Value Date/Time   NA 136 08/08/2012 1015   K 3.8 08/08/2012 1015   CL 99 08/08/2012 1015   CO2 29 08/08/2012 1015   GLUCOSE 255* 08/08/2012 1015   BUN 22 08/08/2012 1015   CREATININE 1.4 08/08/2012 1015   CALCIUM 10.1 08/08/2012 1015   PROT 6.9 08/08/2012 1015   ALBUMIN 4.0 08/08/2012 1015   AST 27 08/08/2012 1015   ALT 22 08/08/2012 1015   ALKPHOS 74 08/08/2012 1015   BILITOT 0.8 08/08/2012 1015   TSH normal  ASSESSMENT AND PLAN: 67 year old male with a past medical history of coronary artery disease status post PCI with drug-eluting stent in May 2012, diabetes, hypertension, dyslipidemia, and peripheral vascular disease who is seen in consultation at the request of Dr. Yetta Barre for evaluation of screening colonoscopy, also with symptomatic GERD  1.  Colonoscopy, screening -- from a colorectal screening standpoint, he is average risk. He would like to proceed with colonoscopy, and we will message Dr. Antoine Poche to ensure it is okay to hold Plavix 5 days before this procedure. It appears to struggle with eating coronary stent was placed in May of 2012, which likely means it is okay to hold this drug, but we'll leave this to the discretion of  cardiology.  We discussed colonoscopy today including the risks and benefits and he is agreeable to proceed.  2.  External hemorrhoids -- he reports history of external hemorrhoids his symptoms are consistent with this diagnosis. I'll give him a prescription for Anusol HC to be used as needed and as directed for hemorrhoidal pain. We will further investigate his hemorrhoids at his upcoming colonoscopy to ensure the bleeding is not coming from another source.  3. GERD -- mild reflux symptoms without alarm  symptom.  I recommended famotidine 20 mg every 12 when necessary. His symptoms are not daily and therefore I do not think he needs a daily PPI at present. If his symptoms become daily or fail to respond famotidine, we would likely pursue PPI therapy.

## 2012-09-10 NOTE — Telephone Encounter (Signed)
Will forward to MD for review

## 2012-09-10 NOTE — Telephone Encounter (Signed)
  09/10/2012    RE: Steven [name] DOB: [birth year] MRN: [id number]   Dear Dr. Antoine Poche,    We have scheduled the above patient for an endoscopic procedure. Our records show that he is on anticoagulation therapy.   Please advise as to how long the patient may come off his therapy of Plavix prior to the procedure, which is scheduled for 10/17/2012.  Please fax back/ or route the completed form to [place] at [phone].   Sincerely,  Adonis Housekeeper  CMA for   Dr. Erick Blinks

## 2012-09-10 NOTE — Patient Instructions (Addendum)
You have been scheduled for a colonoscopy with propofol. Please follow written instructions given to you at your visit today.  Please pick up your prep kit at the pharmacy within the next 1-3 days. If you use inhalers (even only as needed) or a CPAP machine, please bring them with you on the day of your procedure.  We have sent the following medications to your pharmacy for you to pick up at your convenience: Moviprep; you were given instructions today at your office visit, pepcid; take as directed. Anusol HC; take as directed.     We will contact you regarding Plavix

## 2012-09-11 ENCOUNTER — Telehealth: Payer: Self-pay | Admitting: *Deleted

## 2012-09-11 NOTE — Telephone Encounter (Signed)
Pt walked in and left a handicap placard form to be filled out.  I left a message for the pt to determine why he feels he needs a handicap placard.  He does have a hx of CAD and some PVD but no active s/s at the time of his recent office visit.  Dr Antoine Poche had discussed with him to increase his walking.  Will wait for the pt to call back to discuss.

## 2012-09-11 NOTE — Telephone Encounter (Signed)
Will forward 

## 2012-09-11 NOTE — Telephone Encounter (Signed)
I was able to review a cath done in 5/12 in [place].  Based on this he can stop his Plavix.  There is no indication to continue this after his colonoscopy.

## 2012-09-12 ENCOUNTER — Ambulatory Visit: Payer: BC Managed Care – PPO | Admitting: Cardiology

## 2012-09-12 ENCOUNTER — Ambulatory Visit: Payer: BC Managed Care – PPO | Admitting: *Deleted

## 2012-09-12 NOTE — Telephone Encounter (Signed)
Proceed to colonoscopy, per Dr. Antoine Poche Plavix can be stopped and not restarted Please let patient know

## 2012-09-16 ENCOUNTER — Telehealth: Payer: Self-pay | Admitting: Gastroenterology

## 2012-09-16 NOTE — Telephone Encounter (Signed)
Spoke to pt's wife. Told her per Dr. Antoine Poche  was able to review a cath done in 5/12 in [place]. Based on this he can stop his Plavix. There is no indication to continue this after his colonoscopy.  Pt's wife stated understanding.

## 2012-09-21 HISTORY — PX: COLONOSCOPY: SHX174

## 2012-10-01 ENCOUNTER — Telehealth: Payer: Self-pay | Admitting: Internal Medicine

## 2012-10-01 ENCOUNTER — Telehealth: Payer: Self-pay | Admitting: *Deleted

## 2012-10-01 NOTE — Telephone Encounter (Signed)
Left msg on triage wanting to know was it ok for him to stop taking plavix & aspirin. Per chart pt was contacted by GI dept. Called pt back inform him he left msg on wrong floor. Transferred to GI....Raechel Chute

## 2012-10-02 NOTE — Telephone Encounter (Signed)
Spoke to pt's wife.  Confirmed procedure date and time. For some reason she thought it was tomorrow. I told her its the 27th at 2pm

## 2012-10-03 LAB — HM DIABETES EYE EXAM

## 2012-10-04 ENCOUNTER — Telehealth: Payer: Self-pay | Admitting: Cardiology

## 2012-10-04 NOTE — Telephone Encounter (Signed)
Pt called back to talk about the sticker

## 2012-10-04 NOTE — Telephone Encounter (Signed)
Pt states that he cant walk very far without having to stop and rest his leg. He states that a MD in [place] gave him a handicap placard for his leg issues when he was living there. Pt advised that at his last OV in December Dr. Antoine Poche recommended that he start a walking regimen. Pt states he cannot walk very far at all and wants Dr. Jenene Slicker nurse to call him back. Will forward phone note to [facility], Dr. Jenene Slicker nurse.

## 2012-10-08 ENCOUNTER — Encounter: Payer: BC Managed Care – PPO | Attending: Internal Medicine | Admitting: *Deleted

## 2012-10-08 ENCOUNTER — Encounter: Payer: Self-pay | Admitting: *Deleted

## 2012-10-08 DIAGNOSIS — E139 Other specified diabetes mellitus without complications: Secondary | ICD-10-CM

## 2012-10-08 DIAGNOSIS — Z713 Dietary counseling and surveillance: Secondary | ICD-10-CM | POA: Insufficient documentation

## 2012-10-08 DIAGNOSIS — E109 Type 1 diabetes mellitus without complications: Secondary | ICD-10-CM | POA: Insufficient documentation

## 2012-10-08 NOTE — Progress Notes (Signed)
  Medical Nutrition Therapy:  Appt start time: 1000 end time:  1100.  Assessment:  Primary concerns today: patient here for diabetes education. Patient here with his wife, Aram Beecham, Retired from [employer] and just moved here from [place]. States history of diabetes for over 40 years, just started on insulin which he states he is able to take without problem. He states he SMBG twice daily.  MEDICATIONS: see list   DIETARY INTAKE:  Usual eating pattern includes 2-3 meals and 0-1 snacks per day.  Everyday foods include good variety of all food groups except milk.  Avoided foods include milk due to intolerance, .    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   Nutritional Diagnosis:  NB-1.1 Food and nutrition-related knowledge deficit As related to diabetes management.  As evidenced by A1c of 9.1% on 08/08/12 .    Intervention:  Nutrition counseling and diabetes education initiated. Discussed basic physiology of diabetes, SMBG and rationale of checking BG at alternate times of day, A1c, Carb Counting and reading food labels, and benefits of increased activity. Also discussed insulin action and importance of taking Levemir at more consistent times.  . Plan:  Aim for 3-4 Carb Choices per meal (45-60 grams) +/- 1 either way  Aim for 0-30 Carbs per snack if hungry  Consider reading  food labels for Total Carbohydrate of foods Consider checking BG at alternate times per day as directed by MD  Consider taking medication Levemir at more consistent time every night, does not have to be related to bedtime. Handouts given during visit include: Living Well with Diabetes Carb Counting and Food Label handouts Meal Plan Card  Diabetes Medication List  Monitoring/Evaluation:  Dietary intake, exercise, insulin administration more consistent times, and body weight in 6 month(s) per patient request.

## 2012-10-08 NOTE — Telephone Encounter (Signed)
Paperwork for handicap placard had been completed.  I was waiting for pt to call back to discuss with he felt he needed this.  Indication of pt "cannot walk 200 feet without stopping to rest" due to lege pain is the reason.  Left message for pt to pick up at front desk or he may call if he would like paperwork mailed to his home address.

## 2012-10-08 NOTE — Patient Instructions (Addendum)
Plan:  Aim for 3-4 Carb Choices per meal (45-60 grams) +/- 1 either way  Aim for 0-30 Carbs per snack if hungry  Consider reading food labels for Total Carbohydrate of foods Consider checking BG at alternate times per day as directed by MD  Consider taking medication Levemir at more consistent time every night, does not have to be related to bedtime.

## 2012-10-11 ENCOUNTER — Encounter: Payer: Self-pay | Admitting: Internal Medicine

## 2012-10-11 ENCOUNTER — Ambulatory Visit (INDEPENDENT_AMBULATORY_CARE_PROVIDER_SITE_OTHER): Payer: BC Managed Care – PPO | Admitting: Internal Medicine

## 2012-10-11 VITALS — BP 134/78 | HR 94 | Temp 97.5°F | Resp 16 | Wt 153.2 lb

## 2012-10-11 DIAGNOSIS — I10 Essential (primary) hypertension: Secondary | ICD-10-CM

## 2012-10-11 DIAGNOSIS — E119 Type 2 diabetes mellitus without complications: Secondary | ICD-10-CM

## 2012-10-11 DIAGNOSIS — Z23 Encounter for immunization: Secondary | ICD-10-CM

## 2012-10-11 DIAGNOSIS — E139 Other specified diabetes mellitus without complications: Secondary | ICD-10-CM

## 2012-10-11 DIAGNOSIS — I739 Peripheral vascular disease, unspecified: Secondary | ICD-10-CM

## 2012-10-11 MED ORDER — INSULIN DETEMIR 100 UNIT/ML ~~LOC~~ SOLN: 30.0000 [IU] | Freq: Every day | SUBCUTANEOUS | Status: DC

## 2012-10-11 NOTE — Assessment & Plan Note (Signed)
His previous evaluation for this was in [place] I have ordered a repeat arterial flow study to check the severity

## 2012-10-11 NOTE — Assessment & Plan Note (Signed)
He is not comfortable with blood sugars around 68 so will decrease his levemir dose

## 2012-10-11 NOTE — Progress Notes (Signed)
Subjective:    Patient ID: Steven [name], male    DOB: 1946-02-16, 67 y.o.   MRN: [id number]  Diabetes He presents for his follow-up diabetic visit. He has type 1 diabetes mellitus. His disease course has been stable. Hypoglycemia symptoms include confusion, hunger, nervousness/anxiousness and sweats. Pertinent negatives for hypoglycemia include no dizziness, headaches, pallor or tremors. Pertinent negatives for diabetes include no blurred vision, no chest pain, no fatigue, no foot paresthesias, no foot ulcerations, no polydipsia, no polyphagia, no polyuria, no visual change, no weakness and no weight loss. Hypoglycemia complications include nocturnal hypoglycemia (blood sugar = 68 at night ). Pertinent negatives for hypoglycemia complications include no blackouts, no hospitalization, no required assistance and no required glucagon injection. Diabetic complications include heart disease and PVD. Current diabetic treatment includes insulin injections. He is compliant with treatment most of the time. His weight is stable. He is following a generally healthy diet. Meal planning includes avoidance of concentrated sweets. He has had a previous visit with a dietician. He participates in exercise intermittently. His home blood glucose trend is decreasing steadily. His breakfast blood glucose range is generally 90-110 mg/dl. His lunch blood glucose range is generally 110-130 mg/dl. His dinner blood glucose range is generally 110-130 mg/dl. His highest blood glucose is 110-130 mg/dl. His overall blood glucose range is 110-130 mg/dl. He sees a podiatrist.Eye exam is current.      Review of Systems  Constitutional: Negative.  Negative for weight loss, activity change, appetite change and fatigue.  HENT: Negative.   Eyes: Negative.  Negative for blurred vision.  Respiratory: Negative.  Negative for cough, chest tightness, shortness of breath, wheezing and stridor.   Cardiovascular: Negative for chest pain,  palpitations and leg swelling.  Gastrointestinal: Negative for nausea, vomiting, abdominal pain, diarrhea and constipation.  Endocrine: Negative.  Negative for polydipsia, polyphagia and polyuria.  Genitourinary: Negative.  Negative for dysuria, urgency, frequency, hematuria, flank pain and decreased urine volume.  Musculoskeletal: Positive for myalgias (pain in his calves when he walks). Negative for back pain, joint swelling, arthralgias and gait problem.  Skin: Negative for color change, pallor, rash and wound.  Allergic/Immunologic: Negative.   Neurological: Negative for dizziness, tremors, syncope, weakness, light-headedness, numbness and headaches.  Hematological: Negative for adenopathy. Does not bruise/bleed easily.  Psychiatric/Behavioral: Positive for confusion. Negative for behavioral problems, sleep disturbance, decreased concentration and agitation. The patient is nervous/anxious. The patient is not hyperactive.        Objective:   Physical Exam  Vitals reviewed. Constitutional: He is oriented to person, place, and time. He appears well-developed and well-nourished. No distress.  HENT:  Head: Normocephalic and atraumatic.  Mouth/Throat: Oropharynx is clear and moist. No oropharyngeal exudate.  Eyes: Conjunctivae are normal. Right eye exhibits no discharge. Left eye exhibits no discharge. No scleral icterus.  Neck: Normal range of motion. Neck supple. No JVD present. No tracheal deviation present. No thyromegaly present.  Cardiovascular: Normal rate, regular rhythm, normal heart sounds and intact distal pulses.  Exam reveals decreased pulses. Exam reveals no gallop and no friction rub.   No murmur heard. Pulses:      Carotid pulses are 1+ on the right side, and 1+ on the left side.      Radial pulses are 1+ on the right side, and 1+ on the left side.       Femoral pulses are 1+ on the right side, and 1+ on the left side.      Popliteal pulses are 0 on the right  side, and 0 on  the left side.       Dorsalis pedis pulses are 0 on the right side, and 0 on the left side.       Posterior tibial pulses are 0 on the right side, and 0 on the left side.  Pulmonary/Chest: Effort normal and breath sounds normal. No stridor. No respiratory distress. He has no wheezes. He has no rales. He exhibits no tenderness.  Abdominal: Soft. Bowel sounds are normal. He exhibits no distension and no mass. There is no tenderness. There is no rebound and no guarding.  Musculoskeletal: Normal range of motion. He exhibits no edema.  Lymphadenopathy:    He has no cervical adenopathy.  Neurological: He is oriented to person, place, and time.  Skin: Skin is warm and dry. No rash noted. He is not diaphoretic. No erythema. No pallor.  Psychiatric: He has a normal mood and affect. His behavior is normal. Judgment and thought content normal.      Lab Results  Component Value Date   WBC 5.7 08/08/2012   HGB 14.1 08/08/2012   HCT 41.9 08/08/2012   PLT 159.0 08/08/2012   GLUCOSE 255* 08/08/2012   CHOL 177 07/22/2012   TRIG 164.0* 07/22/2012   HDL 62.90 07/22/2012   LDLCALC 81 07/22/2012   ALT 22 08/08/2012   AST 27 08/08/2012   NA 136 08/08/2012   K 3.8 08/08/2012   CL 99 08/08/2012   CREATININE 1.4 08/08/2012   BUN 22 08/08/2012   CO2 29 08/08/2012   TSH 1.11 08/08/2012   PSA 3.27 08/08/2012   HGBA1C 9.1* 08/08/2012   MICROALBUR 5.2* 08/08/2012      Assessment & Plan:

## 2012-10-11 NOTE — Patient Instructions (Signed)
Diabetes, Type 1 Diabetes is a long-term (chronic) disease. It occurs when the cells in the pancreas that make insulin (a hormone) are destroyed and can no longer make insulin. Type 1 diabetes was also previously called juvenile-onset diabetes. It most often occurs before the age of 30, but it can also occur in older people. CAUSES  Among other factors, the following may cause type 1 diabetes:  Genetics. This means it may be passed to you by your parents.  The beta cells that make insulin are destroyed. The cause of this is unknown. SYMPTOMS   Urinating more than usual (or bed-wetting in children).  Drinking more than usual.  Irritability.  Feeling very hungry.  Weight loss (may be rapid).  Nausea and vomiting.  Abdominal pain.  Feeling more tired than usual (fatigue).  Rapid breathing.  Difficulty staying awake.  Night sweats. DIAGNOSIS  Your blood is tested to determine whether you have type 1 diabetes. TREATMENT   You will need to check your blood glucose (sugar) levels several times a day.  You will need to balance insulin, a healthy meal plan, and exercise to maintain normal blood glucose.  Education and ongoing support is recommended.  You should have regular checkups and immunizations. HOME CARE INSTRUCTIONS   Never run out of insulin. It is needed every day.  Do not skip insulin doses.  Do not skip meals. Eat healthy.  Follow your treatment and monitoring plan.  Wear a pendant or bracelet stating you have diabetes and take insulin.  If you start a new exercise or sport, watch for low blood glucose (hypoglycemia) symptoms. Insulin dosing may need to be adjusted.  Follow up with your caregiver regularly.  Tell your workplace or school about your diabetes treatment plan. SEEK MEDICAL CARE IF:   You have problems keeping your blood glucose in target range.  You have blood glucose readings that are often too high or too low.  You have problems with  your medicines.  You have symptoms of an illness that do not improve after 24 hours.  You have a sore or wound that is not healing.  You notice a change in vision or a new problem with your vision.  You have a fever. MAKE SURE YOU:  Understand these instructions.  Will watch your condition.  Will get help right away if you are not doing well or get worse. Document Released: 07/07/2000 Document Revised: 10/02/2011 Document Reviewed: 12/26/2010 [employer] Patient Information 2013 [employer].  

## 2012-10-11 NOTE — Assessment & Plan Note (Signed)
His BP is well controlled 

## 2012-10-16 ENCOUNTER — Telehealth: Payer: Self-pay | Admitting: Internal Medicine

## 2012-10-16 NOTE — Telephone Encounter (Signed)
Returned patient's call.  He was asking about how to drink his prep.  I explained to him that he is to drink one bottle at 5 pm today and mix other bottle and place in refrigerator.  He is to drink 2nd bottle at 9 am in the morning.  I also advised him that he is not to eat solids today.  He stated that he has eaten breakfast and did not know that.  He did not read his instructions given to him in the office and wasn't sure where they were.  He ate oatmeal for breakfast  And I advised him to not eat anything else today.  He is to drink clear liquids the rest of the day.  He states that he would look for his instructions and read it.  I advised him to call if he had anymore questions.  He stated that he understood.

## 2012-10-17 ENCOUNTER — Encounter: Payer: Self-pay | Admitting: Internal Medicine

## 2012-10-17 ENCOUNTER — Other Ambulatory Visit: Payer: Self-pay | Admitting: Internal Medicine

## 2012-10-17 ENCOUNTER — Ambulatory Visit (AMBULATORY_SURGERY_CENTER): Payer: BC Managed Care – PPO | Admitting: Internal Medicine

## 2012-10-17 VITALS — BP 164/74 | HR 76 | Temp 96.1°F | Resp 22 | Ht 65.0 in | Wt 153.0 lb

## 2012-10-17 DIAGNOSIS — D126 Benign neoplasm of colon, unspecified: Secondary | ICD-10-CM

## 2012-10-17 DIAGNOSIS — Z1211 Encounter for screening for malignant neoplasm of colon: Secondary | ICD-10-CM

## 2012-10-17 MED ORDER — SODIUM CHLORIDE 0.9 % IV SOLN: 500.0000 mL | INTRAVENOUS | Status: DC

## 2012-10-17 NOTE — Progress Notes (Signed)
The pt said he took his metformin this am.  His blood sugar was 153 on admission. Maw

## 2012-10-17 NOTE — Progress Notes (Signed)
Per the pt his cardiologist stopped, Dr. Lavina Hamman, stopped plavix 3 weeks ago.  Pt had his stent placed 1 year ago. Maw

## 2012-10-17 NOTE — Op Note (Signed)
[facility] [address] [place], [address]   COLONOSCOPY PROCEDURE REPORT  PATIENT: Steven [name], Steven [name]  MR#: [id number] BIRTHDATE: 1945-10-08 , 66  yrs. old GENDER: Male ENDOSCOPIST: Beverley Fiedler, MD REFERRED XB:[id number], Bernadene Bell. PROCEDURE DATE:  10/17/2012 PROCEDURE:   Colonoscopy with snare polypectomy ASA CLASS:   Class III INDICATIONS:average risk screening and Last colonoscopy performed > 10 years ago. MEDICATIONS: MAC sedation, administered by CRNA and propofol (Diprivan) 300mg  IV  DESCRIPTION OF PROCEDURE:   After the risks benefits and alternatives of the procedure were thoroughly explained, informed consent was obtained.  A digital rectal exam revealed no rectal mass.   The LB CF-[id number]  endoscope was introduced through the anus and advanced to the cecum, which was identified by both the appendix and ileocecal valve. No adverse events experienced. The quality of the prep was good, using MoviPrep  The instrument was then slowly withdrawn as the colon was fully examined.   COLON FINDINGS: Three sessile polyps measuring 4-6 mm in size were found in the transverse colon.  Polypectomy was performed using cold snare.  The resections were complete and the polyp tissue was partially retrieved (2 of 3).   Four sessile polyps measuring 4-5 mm in size were found in the rectosigmoid colon.  Polypectomy was performed using cold snare.  The resections were complete and the polyp tissue was partially retrieved (3 of 4).   Mild diverticulosis was noted at the hepatic flexure and in the sigmoid colon.  Retroflexed views revealed no abnormalities. The time to cecum=4 minutes 20 seconds.  Withdrawal time=18 minutes 42 seconds. The scope was withdrawn and the procedure completed. COMPLICATIONS: There were no complications.  ENDOSCOPIC IMPRESSION: 1.   Three sessile polyps measuring 4-6 mm in size were found in the transverse colon; Polypectomy was performed  using cold snare 2.   Four sessile polyps measuring 4-5 mm in size were found in the rectosigmoid colon; Polypectomy was performed using cold snare 3.   Mild diverticulosis was noted at the hepatic flexure and in the sigmoid colon  RECOMMENDATIONS: 1.  Hold aspirin, aspirin products, and anti-inflammatory medication for 1 week. 2.  High fiber diet 3.  Timing of repeat colonoscopy will be determined by pathology findings. 4.  You will receive a letter within 1-2 weeks with the results of your biopsy as well as final recommendations.  Please call my office if you have not received a letter after 3 weeks.  eSigned:  Beverley Fiedler, MD 10/17/2012 2:36 PM        cc: The Patient

## 2012-10-17 NOTE — Patient Instructions (Addendum)

## 2012-10-17 NOTE — Progress Notes (Signed)
Patient did not experience any of the following events: a burn prior to discharge; a fall within the facility; wrong site/side/patient/procedure/implant event; or a hospital transfer or hospital admission upon discharge from the facility. (G8907) Patient did not have preoperative order for IV antibiotic SSI prophylaxis. (G8918)  

## 2012-10-18 ENCOUNTER — Telehealth: Payer: Self-pay | Admitting: *Deleted

## 2012-10-18 NOTE — Telephone Encounter (Signed)
  Follow up Call-  Call back number 10/17/2012  Post procedure Call Back phone  # [phone] cell  Permission to leave phone message Yes     Patient questions:  Do you have a fever, pain , or abdominal swelling? no Pain Score  0 *  Have you tolerated food without any problems? yes  Have you been able to return to your normal activities? yes  Do you have any questions about your discharge instructions: Diet   no Medications  no Follow up visit  no  Do you have questions or concerns about your Care? no  Actions: * If pain score is 4 or above: No action needed, pain <4.

## 2012-10-22 ENCOUNTER — Encounter (INDEPENDENT_AMBULATORY_CARE_PROVIDER_SITE_OTHER): Payer: BC Managed Care – PPO

## 2012-10-22 ENCOUNTER — Encounter: Payer: Self-pay | Admitting: Internal Medicine

## 2012-10-22 DIAGNOSIS — I70219 Atherosclerosis of native arteries of extremities with intermittent claudication, unspecified extremity: Secondary | ICD-10-CM

## 2012-10-22 DIAGNOSIS — I739 Peripheral vascular disease, unspecified: Secondary | ICD-10-CM

## 2012-10-28 ENCOUNTER — Other Ambulatory Visit: Payer: Self-pay | Admitting: Internal Medicine

## 2012-10-28 DIAGNOSIS — I739 Peripheral vascular disease, unspecified: Secondary | ICD-10-CM

## 2012-11-18 ENCOUNTER — Ambulatory Visit (INDEPENDENT_AMBULATORY_CARE_PROVIDER_SITE_OTHER): Payer: BC Managed Care – PPO | Admitting: Cardiology

## 2012-11-18 ENCOUNTER — Encounter: Payer: Self-pay | Admitting: Cardiology

## 2012-11-18 VITALS — BP 141/81 | HR 81 | Ht 65.0 in | Wt 148.8 lb

## 2012-11-18 DIAGNOSIS — I739 Peripheral vascular disease, unspecified: Secondary | ICD-10-CM

## 2012-11-18 DIAGNOSIS — I10 Essential (primary) hypertension: Secondary | ICD-10-CM

## 2012-11-18 DIAGNOSIS — I2510 Atherosclerotic heart disease of native coronary artery without angina pectoris: Secondary | ICD-10-CM

## 2012-11-18 NOTE — Progress Notes (Signed)
HPI The patient presents for follow up of coronary disease having undergone angioplasty earlier this year in [place]. He was found on catheterization to have an occluded right coronary artery and treated with high-grade LAD stenosis treated with a Promus stent.  There is also some mention of PVD although I don't have records on this.   He says that with activities he denies any symptoms. He has no chest discomfort, neck or arm discomfort. He has no palpitations, presyncope or syncope. He has no shortness of breath, PND or orthopnea. He has no weight gain or edema. He does is having progressive leg pain with walking though he is not having any resting leg pain. He did have lower extremity Dopplers. 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 He has been referred for peripheral vascular surgery consultation.  No Known Allergies  Current Outpatient Prescriptions  Medication Sig Dispense Refill  . ASPIRIN CR PO Take 81 mg by mouth daily.       Marland Kitchen atorvastatin (LIPITOR) 20 MG tablet Take 20 mg by mouth daily.      . cetirizine (ZYRTEC) 10 MG tablet Take 10 mg by mouth daily.      Marland Kitchen glucose blood (ACCU-CHEK COMFORT CURVE) test strip Test up to twice daily Dx:250.42  300 each  4  . hydrochlorothiazide (MICROZIDE) 12.5 MG capsule Take 2 capsules (25 mg total) by mouth daily.  180 capsule  3  . hydrocortisone (ANUSOL-HC) 2.5 % rectal cream Place rectally 2 (two) times daily.  30 g  0  . insulin detemir (LEVEMIR FLEXPEN) 100 UNIT/ML injection Inject 0.3 mLs (30 Units total) into the skin at bedtime.  9 mL  12  . Insulin Pen Needle 32G X 4 MM MISC 1 Act by Does not apply route daily.  100 each  3  . Lancets (ACCU-CHEK SOFT TOUCH) lancets Test up to BID. Dx:250.42  100 each  4  . losartan (COZAAR) 100 MG tablet Take 100 mg by mouth daily.      . metFORMIN (GLUCOPHAGE) 1000 MG tablet Take 1,000 mg by mouth 2 (two) times daily with a meal.      . Tamsulosin HCl (FLOMAX) 0.4 MG CAPS Take 0.4  mg by mouth daily after supper.       No current facility-administered medications for this visit.    Past Medical History  Diagnosis Date  . Coronary artery disease   . Diabetes mellitus without complication   . Dyslipidemia   . Hypertension   . Peripheral vascular disease   . Hernia   . Chest pain   . Heart murmur   . Allergy   . Hyperlipidemia   . Sickle cell anemia     has trait    Past Surgical History  Procedure Laterality Date  . Cardiac catheterization      with stent placement  . Inguinal hernia repair      Right  . Stent x1 cardiac      ROS:   Positive for rare nosebleed. Negative for all other systems.  PHYSICAL EXAM BP 141/81  Pulse 81  Ht 5\' 5"  (1.651 m)  Wt 148 lb 12.8 oz (67.495 kg)  BMI 24.76 kg/m2 GENERAL:  Well appearing NECK:  No jugular venous distention, waveform within normal limits, carotid upstroke brisk and symmetric, no bruits, no thyromegaly LUNGS:  Clear to auscultation bilaterally BACK:  No CVA tenderness CHEST:  Unremarkable HEART:  PMI not displaced or sustained,S1 and S2 within normal limits, no S3,  no S4, no clicks, no rubs, no murmurs ABD:  Flat, positive bowel sounds normal in frequency in pitch, no bruits, no rebound, no guarding, no midline pulsatile mass, no hepatomegaly, no splenomegaly EXT:  2 plus pulses upper, diminished dorsalis pedis and posterior tibialis bilaterally, no edema, no cyanosis no clubbing   ASSESSMENT AND PLAN  CAD  The patient is not having active symptoms.  He will continue with aggressive risk reduction.  See below for further details.  Dyslipidemia His lipid profile in December was at target. We will continue with the medications as listed above.  HTN The blood pressure is at target. No change in medications is indicated. We will continue with therapeutic lifestyle changes (TLC).   PVD His Dopplers are listed elsewhere and he does have severe PVD. He is scheduled to see Dr. Hart Rochester in mid May.   He's not having resting pain and has no nonhealing ulcers. However, he will likely need for revascularization. If he past surgical revascularization like to do a stress test prior to this.

## 2012-11-18 NOTE — Patient Instructions (Addendum)
The current medical regimen is effective;  continue present plan and medications.  Follow up in 6 months with Dr Hochrein.  You will receive a letter in the mail 2 months before you are due.  Please call us when you receive this letter to schedule your follow up appointment.  

## 2012-11-29 ENCOUNTER — Other Ambulatory Visit (INDEPENDENT_AMBULATORY_CARE_PROVIDER_SITE_OTHER): Payer: BC Managed Care – PPO

## 2012-11-29 ENCOUNTER — Ambulatory Visit (INDEPENDENT_AMBULATORY_CARE_PROVIDER_SITE_OTHER)
Admission: RE | Admit: 2012-11-29 | Discharge: 2012-11-29 | Disposition: A | Discharge status: Home / Self Care | Payer: BC Managed Care – PPO | Source: Ambulatory Visit | Attending: Internal Medicine | Admitting: Internal Medicine

## 2012-11-29 ENCOUNTER — Encounter: Payer: Self-pay | Admitting: Internal Medicine

## 2012-11-29 ENCOUNTER — Ambulatory Visit (INDEPENDENT_AMBULATORY_CARE_PROVIDER_SITE_OTHER): Payer: BC Managed Care – PPO | Admitting: Internal Medicine

## 2012-11-29 VITALS — BP 120/62 | HR 63 | Temp 98.2°F | Resp 16 | Wt 149.0 lb

## 2012-11-29 DIAGNOSIS — R059 Cough, unspecified: Secondary | ICD-10-CM | POA: Insufficient documentation

## 2012-11-29 DIAGNOSIS — I10 Essential (primary) hypertension: Secondary | ICD-10-CM

## 2012-11-29 DIAGNOSIS — J309 Allergic rhinitis, unspecified: Secondary | ICD-10-CM

## 2012-11-29 DIAGNOSIS — R05 Cough: Secondary | ICD-10-CM

## 2012-11-29 DIAGNOSIS — E139 Other specified diabetes mellitus without complications: Secondary | ICD-10-CM

## 2012-11-29 DIAGNOSIS — E119 Type 2 diabetes mellitus without complications: Secondary | ICD-10-CM

## 2012-11-29 DIAGNOSIS — J449 Chronic obstructive pulmonary disease, unspecified: Secondary | ICD-10-CM

## 2012-11-29 LAB — BASIC METABOLIC PANEL
GFR: 41.7 mL/min — ABNORMAL LOW (ref >60.00)
Potassium: 4.1 mEq/L (ref 3.5–5.1)
Sodium: 134 mEq/L — ABNORMAL LOW (ref 135–145)

## 2012-11-29 MED ORDER — HYDROCOD POLST-CPM POLST ER 10-8 MG PO CP12: 1.0000 | ORAL_CAPSULE | Freq: Two times a day (BID) | ORAL | Status: DC | PRN

## 2012-11-29 MED ORDER — METHYLPREDNISOLONE ACETATE 40 MG/ML IJ SUSP
40.0000 mg | Freq: Once | INTRAMUSCULAR | Status: AC
  Administered 2012-11-29: 40 mg via INTRAMUSCULAR

## 2012-11-29 MED ORDER — METHYLPREDNISOLONE ACETATE 80 MG/ML IJ SUSP
80.0000 mg | Freq: Once | INTRAMUSCULAR | Status: AC
  Administered 2012-11-29: 80 mg via INTRAMUSCULAR

## 2012-11-29 MED ORDER — MOMETASONE FUROATE 50 MCG/ACT NA SUSP: 4.0000 | Freq: Every day | NASAL | Status: DC

## 2012-11-29 NOTE — Patient Instructions (Addendum)

## 2012-11-29 NOTE — Progress Notes (Signed)
Subjective:    Patient ID: Steven [name], male    DOB: 11-06-1945, 67 y.o.   MRN: [id number]  Cough This is a new problem. The current episode started in the past 7 days. The problem has been unchanged. The problem occurs every few hours. The cough is non-productive. Associated symptoms include nasal congestion, postnasal drip, rhinorrhea, shortness of breath, sweats and wheezing. Pertinent negatives include no chest pain, chills, ear congestion, ear pain, fever, headaches, heartburn, hemoptysis, myalgias, rash, sore throat or weight loss. He has tried nothing for the symptoms. His past medical history is significant for environmental allergies. There is no history of asthma, bronchiectasis, bronchitis, COPD, emphysema or pneumonia.      Review of Systems  Constitutional: Negative.  Negative for fever, chills, weight loss, diaphoresis, activity change, appetite change, fatigue and unexpected weight change.  HENT: Positive for rhinorrhea, sneezing and postnasal drip. Negative for hearing loss, ear pain, nosebleeds, congestion, sore throat, facial swelling, drooling, mouth sores, trouble swallowing, neck pain, neck stiffness, dental problem, voice change, sinus pressure, tinnitus and ear discharge.   Eyes: Negative.   Respiratory: Positive for cough, shortness of breath and wheezing. Negative for apnea, hemoptysis, choking, chest tightness and stridor.   Cardiovascular: Negative.  Negative for chest pain, palpitations and leg swelling.  Gastrointestinal: Negative for heartburn, nausea, vomiting, abdominal pain, diarrhea and constipation.  Endocrine: Negative.   Genitourinary: Negative.   Musculoskeletal: Negative.  Negative for myalgias.  Skin: Negative.  Negative for rash.  Allergic/Immunologic: Positive for environmental allergies.  Neurological: Negative.  Negative for headaches.  Hematological: Negative.  Negative for adenopathy. Does not bruise/bleed easily.  Psychiatric/Behavioral:  Negative.        Objective:   Physical Exam  Vitals reviewed. Constitutional: He is oriented to person, place, and time. He appears well-developed and well-nourished.  Non-toxic appearance. He does not have a sickly appearance. He does not appear ill. No distress.  HENT:  Head: Normocephalic and atraumatic.  Mouth/Throat: Oropharynx is clear and moist. No oropharyngeal exudate.  Eyes: Conjunctivae are normal. Right eye exhibits no discharge. Left eye exhibits no discharge. No scleral icterus.  Neck: Normal range of motion. Neck supple. No JVD present. No tracheal deviation present. No thyromegaly present.  Cardiovascular: Normal rate, regular rhythm, normal heart sounds and intact distal pulses.  Exam reveals no gallop and no friction rub.   No murmur heard. Pulmonary/Chest: Effort normal. No accessory muscle usage or stridor. Not tachypneic. No respiratory distress. He has no decreased breath sounds. He has no wheezes. He has rhonchi in the right middle field and the left middle field. He has no rales. He exhibits no tenderness.  Abdominal: Soft. Bowel sounds are normal. He exhibits no distension and no mass. There is no tenderness. There is no rebound and no guarding.  Musculoskeletal: Normal range of motion. He exhibits no edema and no tenderness.  Lymphadenopathy:    He has no cervical adenopathy.  Neurological: He is oriented to person, place, and time.  Skin: Skin is warm and dry. No rash noted. He is not diaphoretic. No erythema. No pallor.  Psychiatric: He has a normal mood and affect. His behavior is normal. Judgment and thought content normal.      Lab Results  Component Value Date   WBC 5.7 08/08/2012   HGB 14.1 08/08/2012   HCT 41.9 08/08/2012   PLT 159.0 08/08/2012   GLUCOSE 255* 08/08/2012   CHOL 177 07/22/2012   TRIG 164.0* 07/22/2012   HDL 62.90 07/22/2012  LDLCALC 81 07/22/2012   ALT 22 08/08/2012   AST 27 08/08/2012   NA 136 08/08/2012   K 3.8 08/08/2012   CL 99  08/08/2012   CREATININE 1.4 08/08/2012   BUN 22 08/08/2012   CO2 29 08/08/2012   TSH 1.11 08/08/2012   PSA 3.27 08/08/2012   HGBA1C 9.1* 08/08/2012   MICROALBUR 5.2* 08/08/2012      Assessment & Plan:

## 2012-12-01 ENCOUNTER — Encounter: Payer: Self-pay | Admitting: Internal Medicine

## 2012-12-01 DIAGNOSIS — J449 Chronic obstructive pulmonary disease, unspecified: Secondary | ICD-10-CM | POA: Insufficient documentation

## 2012-12-01 NOTE — Assessment & Plan Note (Addendum)
CXR today is abnormal - ?asthma vs COPD I have asked him to see pulm I do not see any evidence of infection I gave him a dose of steroids and have asked him to start tudorza He will try tussicaps for the cough

## 2012-12-01 NOTE — Assessment & Plan Note (Signed)
pulm referral

## 2012-12-01 NOTE — Assessment & Plan Note (Signed)
His BP is well controlled Today I will check his lytes and renal function 

## 2012-12-01 NOTE — Assessment & Plan Note (Signed)
I will recheck his A1C today and will advise further if needed

## 2012-12-01 NOTE — Assessment & Plan Note (Signed)
He is having a flare so I gave him an injection of depo-medrol IM I also asked him to start nasonex ns

## 2012-12-02 ENCOUNTER — Encounter: Payer: Self-pay | Admitting: Vascular Surgery

## 2012-12-03 ENCOUNTER — Other Ambulatory Visit: Payer: Self-pay | Admitting: *Deleted

## 2012-12-03 ENCOUNTER — Encounter: Payer: Self-pay | Admitting: Vascular Surgery

## 2012-12-03 ENCOUNTER — Encounter: Payer: Self-pay | Admitting: *Deleted

## 2012-12-03 ENCOUNTER — Ambulatory Visit (INDEPENDENT_AMBULATORY_CARE_PROVIDER_SITE_OTHER): Payer: BC Managed Care – PPO | Admitting: Vascular Surgery

## 2012-12-03 VITALS — BP 162/82 | HR 64 | Resp 16 | Ht 65.0 in | Wt 151.0 lb

## 2012-12-03 DIAGNOSIS — M79609 Pain in unspecified limb: Secondary | ICD-10-CM | POA: Insufficient documentation

## 2012-12-03 DIAGNOSIS — I739 Peripheral vascular disease, unspecified: Secondary | ICD-10-CM | POA: Insufficient documentation

## 2012-12-03 DIAGNOSIS — I70219 Atherosclerosis of native arteries of extremities with intermittent claudication, unspecified extremity: Secondary | ICD-10-CM | POA: Insufficient documentation

## 2012-12-03 DIAGNOSIS — R202 Paresthesia of skin: Secondary | ICD-10-CM | POA: Insufficient documentation

## 2012-12-03 MED ORDER — ACLIDINIUM BROMIDE 400 MCG/ACT IN AEPB: 1.0000 | INHALATION_SPRAY | Freq: Two times a day (BID) | RESPIRATORY_TRACT | Status: DC

## 2012-12-03 NOTE — Addendum Note (Signed)
Addended by: Etta Grandchild on: 12/03/2012 12:30 PM   Modules accepted: Orders

## 2012-12-03 NOTE — Progress Notes (Signed)
Subjective:     Patient ID: Steven [name], male   DOB: 08/25/1945, 66 y.o.   MRN: [id number]  HPI this 66-year-old male has a long history of bilateral calf claudication. This has gotten much worse over the past several months and now he states he can only walk 30-35 feet before both calves become heavy and tight and shortly thereafter he stopped. He denies rest pain, nonhealing ulcers, infection, gangrene, or other complications. Symptoms are always below the knee and the calf area. He does have a history of tobacco abuse but discontinued smoking in 2013 when he had a stent placed in the coronary artery in [place].  Past Medical History  Diagnosis Date  . Coronary artery disease   . Diabetes mellitus without complication   . Dyslipidemia   . Hypertension   . Peripheral vascular disease   . Hernia   . Chest pain   . Heart murmur   . Allergy   . Hyperlipidemia   . Sickle cell anemia     has trait  . Irregular heartbeat   . GERD (gastroesophageal reflux disease)     History  Substance Use Topics  . Smoking status: Former Smoker    Types: Cigarettes  . Smokeless tobacco: Never Used     Comment: Quit tobacco 2 years ago.    . Alcohol Use: 1.8 oz/week    3 Cans of beer per week    Family History  Problem Relation Age of Onset  . Diabetes Mother   . Hypertension Mother   . Alcohol abuse Neg Hx   . Cancer Neg Hx   . Depression Neg Hx   . Early death Neg Hx   . Heart disease Neg Hx   . Hyperlipidemia Neg Hx   . Kidney disease Neg Hx   . Stroke Neg Hx   . Colon cancer Neg Hx   . Esophageal cancer Neg Hx   . Rectal cancer Neg Hx   . Stomach cancer Neg Hx     No Known Allergies  Current outpatient prescriptions:ASPIRIN CR PO, Take 81 mg by mouth daily. , Disp: , Rfl: ;  atorvastatin (LIPITOR) 20 MG tablet, Take 20 mg by mouth daily., Disp: , Rfl: ;  cetirizine (ZYRTEC) 10 MG tablet, Take 10 mg by mouth daily., Disp: , Rfl: ;  glucose blood (ACCU-CHEK COMFORT CURVE) test  strip, Test up to twice daily Dx:250.42, Disp: 300 each, Rfl: 4 hydrochlorothiazide (MICROZIDE) 12.5 MG capsule, Take 2 capsules (25 mg total) by mouth daily., Disp: 180 capsule, Rfl: 3;  Hydrocod Polst-Chlorphen Polst 10-8 MG CP12, Take 1 tablet by mouth 2 (two) times daily as needed., Disp: 6 each, Rfl: 0;  hydrocortisone (ANUSOL-HC) 2.5 % rectal cream, Place rectally 2 (two) times daily., Disp: 30 g, Rfl: 0 insulin detemir (LEVEMIR FLEXPEN) 100 UNIT/ML injection, Inject 0.3 mLs (30 Units total) into the skin at bedtime., Disp: 9 mL, Rfl: 12;  Insulin Pen Needle 32G X 4 MM MISC, 1 Act by Does not apply route daily., Disp: 100 each, Rfl: 3;  Lancets (ACCU-CHEK SOFT TOUCH) lancets, Test up to BID. Dx:250.42, Disp: 100 each, Rfl: 4;  losartan (COZAAR) 100 MG tablet, Take 100 mg by mouth daily., Disp: , Rfl:  metFORMIN (GLUCOPHAGE) 1000 MG tablet, Take 1,000 mg by mouth 2 (two) times daily with a meal., Disp: , Rfl: ;  mometasone (NASONEX) 50 MCG/ACT nasal spray, Place 4 sprays into the nose daily., Disp: 17 g, Rfl: 12;  Tamsulosin HCl (FLOMAX) 0.4   MG CAPS, Take 0.4 mg by mouth daily after supper., Disp: , Rfl:   BP 162/82  Pulse 64  Resp 16  Ht 5' 5" (1.651 m)  Wt 151 lb (68.493 kg)  BMI 25.13 kg/m2  SpO2 98%  Body mass index is 25.13 kg/(m^2).          Review of Systems denies chest pain, dyspnea on exertion, PND, orthopnea. Does have occasional dysphagia and has a history of cardiac arrhythmias in the past. Recently had Plavix discontinued and he is now taking daily aspirin    Objective:   Physical Exam blood pressure 162/82 heart rate 64 respirations 16 Gen.-alert and oriented x3 in no apparent distress HEENT normal for age Lungs no rhonchi or wheezing Cardiovascular regular rhythm no murmurs carotid pulses 3+ palpable no bruits audible Abdomen soft nontender no palpable masses Musculoskeletal free of  major deformities Skin clear -no rashes Neurologic normal Lower extremities 3+  femoral pulses bilaterally. Absent popliteal and distal pulses. Both feet are cool. No evidence of infection, gangrene, or ulceration. No edema is noted.  Today I reviewed the arterial studies performed on 10/22/2012 at [facility]. This reveals bilateral superficial femoral occlusions with ABI of 0.42 on the right and 0.49 on the left.       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 hochrein History of tobacco abuse 40+ pack years    Plan:     Patient would like to proceed with aortobifemoral angiography and possible PTA and stenting or femoral-popliteal bypass grafting if indicated. We'll schedule for next Tuesday, May 20 Dr. Brabham      

## 2012-12-04 ENCOUNTER — Encounter (HOSPITAL_COMMUNITY): Payer: Self-pay | Admitting: Pharmacy Technician

## 2012-12-04 ENCOUNTER — Telehealth: Payer: Self-pay

## 2012-12-04 DIAGNOSIS — R05 Cough: Secondary | ICD-10-CM

## 2012-12-04 DIAGNOSIS — J449 Chronic obstructive pulmonary disease, unspecified: Secondary | ICD-10-CM

## 2012-12-04 MED ORDER — HYDROCOD POLST-CPM POLST ER 10-8 MG PO CP12: 1.0000 | ORAL_CAPSULE | Freq: Two times a day (BID) | ORAL | Status: DC | PRN

## 2012-12-04 NOTE — Telephone Encounter (Signed)
Phone call from pt requesting a prescription for Tussicaps he states he got samples of at his office visit on 11/29/12. He uses [facility] on [address].

## 2012-12-04 NOTE — Telephone Encounter (Signed)
Please fax in the Rx 

## 2012-12-05 ENCOUNTER — Telehealth: Payer: Self-pay | Admitting: *Deleted

## 2012-12-05 NOTE — Telephone Encounter (Signed)
Left msg on triage stating called yesterday no one call him back. Need his med sent to pharmacy. Called pt back no answer LMOM RTC...lmb

## 2012-12-05 NOTE — Telephone Encounter (Signed)
Pt return call bck he states he call pharmacy and they have his tussicap rx...lmb

## 2012-12-09 MED ORDER — SODIUM CHLORIDE 0.9 % IV SOLN
INTRAVENOUS | Status: DC
  Administered 2012-12-10: 09:00:00 via INTRAVENOUS

## 2012-12-10 ENCOUNTER — Encounter (HOSPITAL_COMMUNITY)
Admission: RE | Disposition: A | Discharge status: Home / Self Care | Payer: Self-pay | Source: Ambulatory Visit | Attending: Surgery

## 2012-12-10 ENCOUNTER — Ambulatory Visit (HOSPITAL_COMMUNITY)
Admission: RE | Admit: 2012-12-10 | Discharge: 2012-12-10 | Disposition: A | Discharge status: Home / Self Care | Payer: BC Managed Care – PPO | Source: Ambulatory Visit | Attending: Surgery | Admitting: Surgery

## 2012-12-10 DIAGNOSIS — D573 Sickle-cell trait: Secondary | ICD-10-CM | POA: Insufficient documentation

## 2012-12-10 DIAGNOSIS — I10 Essential (primary) hypertension: Secondary | ICD-10-CM | POA: Insufficient documentation

## 2012-12-10 DIAGNOSIS — E785 Hyperlipidemia, unspecified: Secondary | ICD-10-CM | POA: Insufficient documentation

## 2012-12-10 DIAGNOSIS — Z7982 Long term (current) use of aspirin: Secondary | ICD-10-CM | POA: Insufficient documentation

## 2012-12-10 DIAGNOSIS — Z794 Long term (current) use of insulin: Secondary | ICD-10-CM | POA: Insufficient documentation

## 2012-12-10 DIAGNOSIS — I708 Atherosclerosis of other arteries: Secondary | ICD-10-CM | POA: Insufficient documentation

## 2012-12-10 DIAGNOSIS — I70219 Atherosclerosis of native arteries of extremities with intermittent claudication, unspecified extremity: Secondary | ICD-10-CM

## 2012-12-10 DIAGNOSIS — I2510 Atherosclerotic heart disease of native coronary artery without angina pectoris: Secondary | ICD-10-CM | POA: Insufficient documentation

## 2012-12-10 DIAGNOSIS — Z79899 Other long term (current) drug therapy: Secondary | ICD-10-CM | POA: Insufficient documentation

## 2012-12-10 DIAGNOSIS — E119 Type 2 diabetes mellitus without complications: Secondary | ICD-10-CM | POA: Insufficient documentation

## 2012-12-10 DIAGNOSIS — Z87891 Personal history of nicotine dependence: Secondary | ICD-10-CM | POA: Insufficient documentation

## 2012-12-10 DIAGNOSIS — I7092 Chronic total occlusion of artery of the extremities: Secondary | ICD-10-CM | POA: Insufficient documentation

## 2012-12-10 DIAGNOSIS — R011 Cardiac murmur, unspecified: Secondary | ICD-10-CM | POA: Insufficient documentation

## 2012-12-10 DIAGNOSIS — Z9861 Coronary angioplasty status: Secondary | ICD-10-CM | POA: Insufficient documentation

## 2012-12-10 DIAGNOSIS — K219 Gastro-esophageal reflux disease without esophagitis: Secondary | ICD-10-CM | POA: Insufficient documentation

## 2012-12-10 HISTORY — PX: ABDOMINAL AORTAGRAM: SHX5454

## 2012-12-10 LAB — POCT I-STAT, CHEM 8
Glucose, Bld: 118 mg/dL — ABNORMAL HIGH (ref 70–99)
HCT: 41 % (ref 39.0–52.0)
Hemoglobin: 13.9 g/dL (ref 13.0–17.0)
Potassium: 3.6 mEq/L (ref 3.5–5.1)
Sodium: 142 mEq/L (ref 135–145)

## 2012-12-10 SURGERY — ABDOMINAL AORTAGRAM
Anesthesia: LOCAL

## 2012-12-10 MED ORDER — ACETAMINOPHEN 325 MG PO TABS: 325.0000 mg | ORAL_TABLET | ORAL | Status: DC | PRN

## 2012-12-10 MED ORDER — GUAIFENESIN-DM 100-10 MG/5ML PO SYRP: 15.0000 mL | ORAL_SOLUTION | ORAL | Status: DC | PRN

## 2012-12-10 MED ORDER — FENTANYL CITRATE 0.05 MG/ML IJ SOLN
INTRAMUSCULAR | Status: AC
  Filled 2012-12-10: qty 2

## 2012-12-10 MED ORDER — PHENOL 1.4 % MT LIQD: 1.0000 | OROMUCOSAL | Status: DC | PRN

## 2012-12-10 MED ORDER — HYDRALAZINE HCL 20 MG/ML IJ SOLN: 10.0000 mg | INTRAMUSCULAR | Status: DC | PRN

## 2012-12-10 MED ORDER — ACETAMINOPHEN 325 MG RE SUPP: 325.0000 mg | RECTAL | Status: DC | PRN

## 2012-12-10 MED ORDER — OXYCODONE-ACETAMINOPHEN 5-325 MG PO TABS: 1.0000 | ORAL_TABLET | ORAL | Status: DC | PRN

## 2012-12-10 MED ORDER — ALUM & MAG HYDROXIDE-SIMETH 200-200-20 MG/5ML PO SUSP: 15.0000 mL | ORAL | Status: DC | PRN

## 2012-12-10 MED ORDER — ONDANSETRON HCL 4 MG/2ML IJ SOLN: 4.0000 mg | Freq: Four times a day (QID) | INTRAMUSCULAR | Status: DC | PRN

## 2012-12-10 MED ORDER — SODIUM CHLORIDE 0.9 % IV SOLN: 1.0000 mL/kg/h | INTRAVENOUS | Status: DC

## 2012-12-10 MED ORDER — LABETALOL HCL 5 MG/ML IV SOLN: 10.0000 mg | INTRAVENOUS | Status: DC | PRN

## 2012-12-10 MED ORDER — METOPROLOL TARTRATE 1 MG/ML IV SOLN: 2.0000 mg | INTRAVENOUS | Status: DC | PRN

## 2012-12-10 MED ORDER — MORPHINE SULFATE 10 MG/ML IJ SOLN: 2.0000 mg | INTRAMUSCULAR | Status: DC | PRN

## 2012-12-10 MED ORDER — LIDOCAINE HCL (PF) 1 % IJ SOLN
INTRAMUSCULAR | Status: AC
  Filled 2012-12-10: qty 30

## 2012-12-10 MED ORDER — MIDAZOLAM HCL 2 MG/2ML IJ SOLN
INTRAMUSCULAR | Status: AC
  Filled 2012-12-10: qty 2

## 2012-12-10 NOTE — H&P (View-Only) (Signed)
Subjective:     Patient ID: Steven [name], male   DOB: [birth year], 67 y.o.   MRN: [id number]  HPI this 67 year old male has a long history of bilateral calf claudication. This has gotten much worse over the past several months and now he states he can only walk 30-35 feet before both calves become heavy and tight and shortly thereafter he stopped. He denies rest pain, nonhealing ulcers, infection, gangrene, or other complications. Symptoms are always below the knee and the calf area. He does have a history of tobacco abuse but discontinued smoking in 2013 when he had a stent placed in the coronary artery in [place].  Past Medical History  Diagnosis Date  . Coronary artery disease   . Diabetes mellitus without complication   . Dyslipidemia   . Hypertension   . Peripheral vascular disease   . Hernia   . Chest pain   . Heart murmur   . Allergy   . Hyperlipidemia   . Sickle cell anemia     has trait  . Irregular heartbeat   . GERD (gastroesophageal reflux disease)     History  Substance Use Topics  . Smoking status: Former Smoker    Types: Cigarettes  . Smokeless tobacco: Never Used     Comment: Quit tobacco 2 years ago.    . Alcohol Use: 1.8 oz/week    3 Cans of beer per week    Family History  Problem Relation Age of Onset  . Diabetes Mother   . Hypertension Mother   . Alcohol abuse Neg Hx   . Cancer Neg Hx   . Depression Neg Hx   . Early death Neg Hx   . Heart disease Neg Hx   . Hyperlipidemia Neg Hx   . Kidney disease Neg Hx   . Stroke Neg Hx   . Colon cancer Neg Hx   . Esophageal cancer Neg Hx   . Rectal cancer Neg Hx   . Stomach cancer Neg Hx     No Known Allergies  Current outpatient prescriptions:ASPIRIN CR PO, Take 81 mg by mouth daily. , Disp: , Rfl: ;  atorvastatin (LIPITOR) 20 MG tablet, Take 20 mg by mouth daily., Disp: , Rfl: ;  cetirizine (ZYRTEC) 10 MG tablet, Take 10 mg by mouth daily., Disp: , Rfl: ;  glucose blood (ACCU-CHEK COMFORT CURVE) test  strip, Test up to twice daily Dx:250.42, Disp: 300 each, Rfl: 4 hydrochlorothiazide (MICROZIDE) 12.5 MG capsule, Take 2 capsules (25 mg total) by mouth daily., Disp: 180 capsule, Rfl: 3;  Hydrocod Polst-Chlorphen Polst 10-8 MG CP12, Take 1 tablet by mouth 2 (two) times daily as needed., Disp: 6 each, Rfl: 0;  hydrocortisone (ANUSOL-HC) 2.5 % rectal cream, Place rectally 2 (two) times daily., Disp: 30 g, Rfl: 0 insulin detemir (LEVEMIR FLEXPEN) 100 UNIT/ML injection, Inject 0.3 mLs (30 Units total) into the skin at bedtime., Disp: 9 mL, Rfl: 12;  Insulin Pen Needle 32G X 4 MM MISC, 1 Act by Does not apply route daily., Disp: 100 each, Rfl: 3;  Lancets (ACCU-CHEK SOFT TOUCH) lancets, Test up to BID. Dx:250.42, Disp: 100 each, Rfl: 4;  losartan (COZAAR) 100 MG tablet, Take 100 mg by mouth daily., Disp: , Rfl:  metFORMIN (GLUCOPHAGE) 1000 MG tablet, Take 1,000 mg by mouth 2 (two) times daily with a meal., Disp: , Rfl: ;  mometasone (NASONEX) 50 MCG/ACT nasal spray, Place 4 sprays into the nose daily., Disp: 17 g, Rfl: 12;  Tamsulosin HCl (FLOMAX) 0.4  MG CAPS, Take 0.4 mg by mouth daily after supper., Disp: , Rfl:   BP 162/82  Pulse 64  Resp 16  Ht 5\' 5"  (1.651 m)  Wt 151 lb (68.493 kg)  BMI 25.13 kg/m2  SpO2 98%  Body mass index is 25.13 kg/(m^2).          Review of Systems denies chest pain, dyspnea on exertion, PND, orthopnea. Does have occasional dysphagia and has a history of cardiac arrhythmias in the past. Recently had Plavix discontinued and he is now taking daily aspirin    Objective:   Physical Exam blood pressure 162/82 heart rate 64 respirations 16 Gen.-alert and oriented x3 in no apparent distress HEENT normal for age Lungs no rhonchi or wheezing Cardiovascular regular rhythm no murmurs carotid pulses 3+ palpable no bruits audible Abdomen soft nontender no palpable masses Musculoskeletal free of  major deformities Skin clear -no rashes Neurologic normal Lower extremities 3+  femoral pulses bilaterally. Absent popliteal and distal pulses. Both feet are cool. No evidence of infection, gangrene, or ulceration. No edema is noted.  Today I reviewed the arterial studies performed on 10/22/2012 at [facility]. This reveals bilateral superficial femoral occlusions with ABI of 0.42 on the right and 0.49 on the left.       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 Antoine Poche History of tobacco abuse 40+ pack years    Plan:     Patient would like to proceed with aortobifemoral angiography and possible PTA and stenting or femoral-popliteal bypass grafting if indicated. We'll schedule for next Tuesday, May 20 Dr. Myra Gianotti

## 2012-12-10 NOTE — Op Note (Signed)
[facility]  Patient name: Steven [name] MRN: [id number] DOB: 07/21/1946 Sex: male  12/10/2012 Pre-operative Diagnosis: Bilateral claudication Post-operative diagnosis:  Same Surgeon:  Jorge Ny Procedure Performed:  1.  ultrasound-guided access, right femoral artery  2.  abdominal aortogram  3.  bilateral lower extremity runoff  4.  second order catheterization    Indications:  The patient suffers from bilateral lower extremity claudication. Ultrasound suggests bilateral superficial femoral occlusion. He is here today for further evaluation and possible intervention.  Procedure:  The patient was identified in the holding area and taken to room 8.  The patient was then placed supine on the table and prepped and draped in the usual sterile fashion.  A time out was called.  Ultrasound was used to evaluate the right common femoral artery.  It was patent .  A digital ultrasound image was acquired.  A micropuncture needle was used to access the right common femoral artery under ultrasound guidance.  An 018 wire was advanced without resistance and a micropuncture sheath was placed.  The 018 wire was removed and a benson wire was placed.  The micropuncture sheath was exchanged for a 5 french sheath.  An omniflush catheter was advanced over the wire to the level of L-1.  An abdominal angiogram was obtained.  Next, using the omniflush catheter and a benson wire, the aortic bifurcation was crossed and the catheter was placed into theleft external iliac artery and left runoff was obtained.  right runoff was performed via retrograde sheath injections.  Findings:   Aortogram:  The visualized portions of the suprarenal abdominal aorta showed no significant stenosis. Single renal arteries are widely patent. The infrarenal abdominal aorta is widely patent. Bilateral iliac arteries are calcified but no significant stenosis is identified  Right Lower Extremity:  The right  common femoral artery is calcified but patent throughout it's course. The profunda femoral artery is widely patent. There is a flush occlusion of the superficial femoral artery at the origin. There is reconstitution of the proximal popliteal artery at the level of the adductor canal. The popliteal artery is patent throughout it's course. There is two-vessel runoff via the peroneal and posterior tibial artery  Left Lower Extremity:  The left common femoral artery is calcified but patent throughout it's course. The profunda femoral artery is widely patent. There is a flush occlusion of the superficial femoral artery at the origin. There is reconstitution of the proximal popliteal artery at the level of the adductor canal. The popliteal artery is patent throughout it's course. There is two-vessel runoff via the peroneal and posterior tibial artery.    Intervention:  None  Impression:  #1  no significant aortoiliac occlusive disease however the iliac vessels are calcified bilaterally. No hemodynamically significant stenosis was identified.  #2  flush occlusion of the right superficial femoral artery with reconstitution of the popliteal artery at the level of the adductor canal. There is two-vessel runoff via the peroneal and posterior tibial artery  #3  flush occlusion of the left superficial femoral artery with reconstitution of the popliteal artery at the level of the adductor canal. There is two-vessel runoff via the peroneal and posterior tibial artery  #4  the patient is a very poor candidate for percutaneous intervention. I recommend surgical revascularization.   Juleen China, M.D. [facility] Office: [phone] Pager:  [phone]

## 2012-12-10 NOTE — Interval H&P Note (Signed)
History and Physical Interval Note:  12/10/2012 1:33 PM  Steven [name]  has presented today for surgery, with the diagnosis of PVD  The various methods of treatment have been discussed with the patient and family. After consideration of risks, benefits and other options for treatment, the patient has consented to  Procedure(s): ABDOMINAL AORTAGRAM (N/A) as a surgical intervention .  The patient's history has been reviewed, patient examined, no change in status, stable for surgery.  I have reviewed the patient's chart and labs.  Questions were answered to the patient's satisfaction.     Mykell Genao IV, V. WELLS

## 2012-12-11 ENCOUNTER — Other Ambulatory Visit: Payer: Self-pay | Admitting: *Deleted

## 2012-12-11 ENCOUNTER — Telehealth: Payer: Self-pay

## 2012-12-11 ENCOUNTER — Telehealth: Payer: Self-pay | Admitting: Vascular Surgery

## 2012-12-11 ENCOUNTER — Other Ambulatory Visit: Payer: Self-pay

## 2012-12-11 DIAGNOSIS — I739 Peripheral vascular disease, unspecified: Secondary | ICD-10-CM

## 2012-12-11 DIAGNOSIS — Z01810 Encounter for preprocedural cardiovascular examination: Secondary | ICD-10-CM

## 2012-12-11 NOTE — Telephone Encounter (Signed)
6.6% good control

## 2012-12-11 NOTE — Telephone Encounter (Signed)
Pt asks does he still need to f/u with you in 2 weeks regarding his creatinine level?

## 2012-12-11 NOTE — Telephone Encounter (Signed)
Pt calls for a1c test results. Please advise.

## 2012-12-11 NOTE — Telephone Encounter (Signed)
Gave patient appt info - kf °

## 2012-12-11 NOTE — Telephone Encounter (Signed)
Yes, for a recheck

## 2012-12-11 NOTE — Telephone Encounter (Signed)
Message copied by Margaretmary Eddy on Wed Dec 11, 2012  2:49 PM ------      Message from: Melene Plan      Created: Tue Dec 10, 2012  4:33 PM       Per Dr Kipp Brood he needs an office visit with Bilateral vein mapping of GSV      ----- Message -----         From: Nada Libman, MD         Sent: 12/10/2012   2:01 PM           To: Reuel Derby, Melene Plan, RN            12/10/2012:            Surgeon:  Jorge Ny      Procedure Performed:       1.  ultrasound-guided access, right femoral artery       2.  abdominal aortogram       3.  bilateral lower extremity runoff       4.  second order catheterization      Dr. Hart Rochester we'll determine the need for revascularization. Please discuss this with him I will try to leave a message       ------

## 2012-12-12 NOTE — Telephone Encounter (Signed)
Pt notified and transferred to reception desk to make an appt

## 2012-12-13 ENCOUNTER — Telehealth: Payer: Self-pay | Admitting: *Deleted

## 2012-12-13 NOTE — Telephone Encounter (Signed)
Mr. Dines called to report that he has had some itching all over his body since last night; he had aortogram on 12-10-12 by Dr. Myra Gianotti. He states that he has had no food, drug or dye allergies in the past. I discussed this the Oswaldo Done, RN and we instructed the patient to take Benadryl. I also instructed him on S&S of anaphylaxis and the need to report immediately to the ED if he should have any SOB, angioedema, etc.  He voiced understanding and agreement with this plan.

## 2012-12-17 ENCOUNTER — Telehealth: Payer: Self-pay | Admitting: Internal Medicine

## 2012-12-17 DIAGNOSIS — E139 Other specified diabetes mellitus without complications: Secondary | ICD-10-CM

## 2012-12-17 MED ORDER — INSULIN PEN NEEDLE 32G X 4 MM MISC: Status: DC

## 2012-12-17 NOTE — Telephone Encounter (Signed)
PT needs an RX for 20 needles for his insulin called into [facility] on [address].  He is going out of town tomorrow morning.

## 2012-12-17 NOTE — Telephone Encounter (Signed)
Sent e-script to pharmacy as requested for pen needles. Pharmacy to notify

## 2012-12-24 ENCOUNTER — Ambulatory Visit (INDEPENDENT_AMBULATORY_CARE_PROVIDER_SITE_OTHER): Payer: BC Managed Care – PPO | Admitting: Internal Medicine

## 2012-12-24 ENCOUNTER — Encounter: Payer: Self-pay | Admitting: Internal Medicine

## 2012-12-24 VITALS — BP 138/82 | HR 80 | Temp 98.6°F | Resp 16 | Wt 149.0 lb

## 2012-12-24 DIAGNOSIS — E119 Type 2 diabetes mellitus without complications: Secondary | ICD-10-CM

## 2012-12-24 DIAGNOSIS — E139 Other specified diabetes mellitus without complications: Secondary | ICD-10-CM

## 2012-12-24 DIAGNOSIS — N182 Chronic kidney disease, stage 2 (mild): Secondary | ICD-10-CM

## 2012-12-24 DIAGNOSIS — J449 Chronic obstructive pulmonary disease, unspecified: Secondary | ICD-10-CM

## 2012-12-24 DIAGNOSIS — N189 Chronic kidney disease, unspecified: Secondary | ICD-10-CM | POA: Insufficient documentation

## 2012-12-24 DIAGNOSIS — I10 Essential (primary) hypertension: Secondary | ICD-10-CM

## 2012-12-24 DIAGNOSIS — L219 Seborrheic dermatitis, unspecified: Secondary | ICD-10-CM | POA: Insufficient documentation

## 2012-12-24 MED ORDER — HYDROCORTISONE 2.5 % EX CREA: TOPICAL_CREAM | Freq: Two times a day (BID) | CUTANEOUS | Status: DC

## 2012-12-24 NOTE — Assessment & Plan Note (Signed)
Continue hydrocortisone cream

## 2012-12-24 NOTE — Patient Instructions (Signed)
Seborrheic Dermatitis Seborrheic dermatitis involves pink or red skin with greasy, flaky scales. This is often found on the scalp, eyebrows, nose, bearded area, and on or behind the ears. It can also occur on the central chest. It often occurs where there are more oil (sebaceous) glands. This condition is also known as dandruff. When this condition affects a baby's scalp, it is called cradle cap. It may come and go for no known reason. It can occur at any time of life from infancy to old age. CAUSES  The cause is unknown. It is not the result of too little moisture or too much oil. In some people, seborrheic dermatitis flare-ups seem to be triggered by stress. It also commonly occurs in people with certain diseases such as Parkinson's disease or HIV/AIDS. SYMPTOMS   Thick scales on the scalp.  Redness on the face or in the armpits.  The skin may seem oily or dry, but moisturizers do not help.  In infants, seborrheic dermatitis appears as scaly redness that does not seem to bother the baby. In some babies, it affects only the scalp. In others, it also affects the neck creases, armpits, groin, or behind the ears.  In adults and adolescents, seborrheic dermatitis may affect only the scalp. It may look patchy or spread out, with areas of redness and flaking. Other areas commonly affected include:  Eyebrows.  Eyelids.  Forehead.  Skin behind the ears.  Outer ears.  Chest.  Armpits.  Nose creases.  Skin creases under the breasts.  Skin between the buttocks.  Groin.  Some adults and adolescents feel itching or burning in the affected areas. DIAGNOSIS  Your caregiver can usually tell what the problem is by doing a physical exam. TREATMENT   Cortisone (steroid) ointments, creams, and lotions can help decrease inflammation.  Babies can be treated with baby oil to soften the scales, then they may be washed with baby shampoo. If this does not help, a prescription topical steroid  medicine may work.  Adults can use medicated shampoos.  Your caregiver may prescribe corticosteroid cream and shampoo containing an antifungal or yeast medicine (ketoconazole). Hydrocortisone or anti-yeast cream can be rubbed directly onto seborrheic dermatitis patches. Yeast does not cause seborrheic dermatitis, but it seems to add to the problem. In infants, seborrheic dermatitis is often worst during the first year of life. It tends to disappear on its own as the child grows. However, it may return during the teenage years. In adults and adolescents, seborrheic dermatitis tends to be a long-lasting condition that comes and goes over many years. HOME CARE INSTRUCTIONS   Use prescribed medicines as directed.  In infants, do not aggressively remove the scales or flakes on the scalp with a comb or by other means. This may lead to hair loss. SEEK MEDICAL CARE IF:   The problem does not improve from the medicated shampoos, lotions, or other medicines given by your caregiver.  You have any other questions or concerns. Document Released: 07/10/2005 Document Revised: 01/09/2012 Document Reviewed: 11/29/2009 [employer] Patient Information 2014 [employer].  

## 2012-12-24 NOTE — Assessment & Plan Note (Signed)
His BP is well controlled 

## 2012-12-24 NOTE — Assessment & Plan Note (Signed)
His recent A1C shows good control

## 2012-12-24 NOTE — Progress Notes (Signed)
  Subjective:    Patient ID: Steven [name], male    DOB: 08/17/1945, 67 y.o.   MRN: [id number]  Rash This is a chronic problem. The current episode started more than 1 year ago. The problem has been gradually improving since onset. The affected locations include the face. The rash is characterized by scaling. He was exposed to nothing. Pertinent negatives include no anorexia, congestion, cough, diarrhea, eye pain, facial edema, fatigue, fever, joint pain, nail changes, rhinorrhea, shortness of breath, sore throat or vomiting. Past treatments include topical steroids. The treatment provided significant relief. (Seb derm)      Review of Systems  Constitutional: Negative.  Negative for fever and fatigue.  HENT: Negative.  Negative for congestion, sore throat and rhinorrhea.   Eyes: Negative.  Negative for pain.  Respiratory: Negative.  Negative for cough and shortness of breath.   Cardiovascular: Negative.   Gastrointestinal: Negative.  Negative for vomiting, diarrhea and anorexia.  Endocrine: Negative.   Genitourinary: Negative.   Musculoskeletal: Negative.  Negative for joint pain.  Skin: Positive for rash. Negative for nail changes, color change, pallor and wound.  Allergic/Immunologic: Negative.   Neurological: Negative.   Hematological: Negative.   Psychiatric/Behavioral: Negative.        Objective:   Physical Exam  Vitals reviewed. Constitutional: He is oriented to person, place, and time. He appears well-developed and well-nourished. No distress.  HENT:  Head: Normocephalic and atraumatic.  Mouth/Throat: Oropharynx is clear and moist. No oropharyngeal exudate.  Eyes: Conjunctivae are normal. Right eye exhibits no discharge. Left eye exhibits no discharge. No scleral icterus.  Neck: Normal range of motion. Neck supple. No JVD present. No tracheal deviation present. No thyromegaly present.  Cardiovascular: Normal rate, regular rhythm, normal heart sounds and intact distal pulses.   Exam reveals no gallop and no friction rub.   No murmur heard. Pulmonary/Chest: Effort normal and breath sounds normal. No stridor. No respiratory distress. He has no wheezes. He has no rales. He exhibits no tenderness.  Abdominal: Soft. Bowel sounds are normal. He exhibits no distension and no mass. There is no tenderness. There is no rebound and no guarding.  Musculoskeletal: Normal range of motion. He exhibits no edema and no tenderness.  Lymphadenopathy:    He has no cervical adenopathy.  Neurological: He is oriented to person, place, and time.  Skin: Skin is warm and dry. No rash noted. He is not diaphoretic. No erythema. No pallor.  Psychiatric: He has a normal mood and affect. His behavior is normal. Judgment and thought content normal.     Lab Results  Component Value Date   WBC 5.7 08/08/2012   HGB 13.9 12/10/2012   HCT 41.0 12/10/2012   PLT 159.0 08/08/2012   GLUCOSE 118* 12/10/2012   CHOL 177 07/22/2012   TRIG 164.0* 07/22/2012   HDL 62.90 07/22/2012   LDLCALC 81 07/22/2012   ALT 22 08/08/2012   AST 27 08/08/2012   NA 142 12/10/2012   K 3.6 12/10/2012   CL 102 12/10/2012   CREATININE 1.30 12/10/2012   BUN 29* 12/10/2012   CO2 25 11/29/2012   TSH 1.11 08/08/2012   PSA 3.27 08/08/2012   HGBA1C 6.6* 11/29/2012   MICROALBUR 5.2* 08/08/2012       Assessment & Plan:

## 2013-01-17 ENCOUNTER — Ambulatory Visit: Payer: BC Managed Care – PPO | Admitting: Internal Medicine

## 2013-01-20 ENCOUNTER — Encounter: Payer: Self-pay | Admitting: Vascular Surgery

## 2013-01-21 ENCOUNTER — Encounter: Payer: Self-pay | Admitting: Vascular Surgery

## 2013-01-21 ENCOUNTER — Ambulatory Visit (INDEPENDENT_AMBULATORY_CARE_PROVIDER_SITE_OTHER): Payer: BC Managed Care – PPO | Admitting: Vascular Surgery

## 2013-01-21 ENCOUNTER — Encounter (INDEPENDENT_AMBULATORY_CARE_PROVIDER_SITE_OTHER): Payer: BC Managed Care – PPO | Admitting: *Deleted

## 2013-01-21 VITALS — BP 140/75 | HR 70 | Resp 16 | Ht 65.0 in | Wt 145.0 lb

## 2013-01-21 DIAGNOSIS — I739 Peripheral vascular disease, unspecified: Secondary | ICD-10-CM | POA: Insufficient documentation

## 2013-01-21 DIAGNOSIS — Z01810 Encounter for preprocedural cardiovascular examination: Secondary | ICD-10-CM

## 2013-01-21 NOTE — Progress Notes (Signed)
Subjective:     Patient ID: Steven [name], male   DOB: 11/12/1945, 66 y.o.   MRN: [id number]  HPI this 66-year-old male returns to discuss his angiographic findings. He recently had an arteriogram performed by Dr. Brabham for severe bilateral calf claudication. Patient is unable to walk more than 30 or 40 feet without pain in both calves. He has no history of rest pain or nonhealing ulcers or infection. He had angiography performed a few weeks ago and he was not a good candidate for PTA and stenting because of flush occlusion of both superficial femoral arteries. He does have reconstitution of both popliteal arteries well above the knee.  Past Medical History  Diagnosis Date  . Coronary artery disease   . Diabetes mellitus without complication   . Dyslipidemia   . Hypertension   . Peripheral vascular disease   . Hernia   . Chest pain   . Heart murmur   . Allergy   . Hyperlipidemia   . Sickle cell anemia     has trait  . Irregular heartbeat   . GERD (gastroesophageal reflux disease)     History  Substance Use Topics  . Smoking status: Former Smoker    Types: Cigarettes  . Smokeless tobacco: Never Used     Comment: Quit tobacco 2 years ago.    . Alcohol Use: 1.8 oz/week    3 Cans of beer per week    Family History  Problem Relation Age of Onset  . Diabetes Mother   . Hypertension Mother   . Alcohol abuse Neg Hx   . Cancer Neg Hx   . Depression Neg Hx   . Early death Neg Hx   . Heart disease Neg Hx   . Hyperlipidemia Neg Hx   . Kidney disease Neg Hx   . Stroke Neg Hx   . Colon cancer Neg Hx   . Esophageal cancer Neg Hx   . Rectal cancer Neg Hx   . Stomach cancer Neg Hx     No Known Allergies  Current outpatient prescriptions:aspirin EC 81 MG tablet, Take 81 mg by mouth daily., Disp: , Rfl: ;  atorvastatin (LIPITOR) 20 MG tablet, Take 20 mg by mouth daily., Disp: , Rfl: ;  cetirizine (ZYRTEC) 10 MG tablet, Take 10 mg by mouth daily., Disp: , Rfl: ;  glucose blood  (ACCU-CHEK COMFORT CURVE) test strip, Test up to twice daily Dx:250.42, Disp: 300 each, Rfl: 4 hydrochlorothiazide (MICROZIDE) 12.5 MG capsule, Take 2 capsules (25 mg total) by mouth daily., Disp: 180 capsule, Rfl: 3;  Hydrocod Polst-Chlorphen Polst 10-8 MG CP12, Take 1 tablet by mouth 2 (two) times daily as needed., Disp: 25 each, Rfl: 1;  hydrocortisone (ANUSOL-HC) 2.5 % rectal cream, Place 1 application rectally 2 (two) times daily as needed. For hemorrhoids, Disp: , Rfl:  hydrocortisone 2.5 % cream, Apply topically 2 (two) times daily., Disp: 30 g, Rfl: 11;  insulin detemir (LEVEMIR FLEXPEN) 100 UNIT/ML injection, Inject 0.3 mLs (30 Units total) into the skin at bedtime., Disp: 9 mL, Rfl: 12;  Insulin Pen Needle 32G X 4 MM MISC, Use as directed with insulin, Disp: 30 each, Rfl: 0;  Lancets (ACCU-CHEK SOFT TOUCH) lancets, Test up to BID. Dx:250.42, Disp: 100 each, Rfl: 4 losartan (COZAAR) 100 MG tablet, Take 100 mg by mouth daily., Disp: , Rfl: ;  metFORMIN (GLUCOPHAGE) 1000 MG tablet, Take 1,000 mg by mouth 2 (two) times daily with a meal., Disp: , Rfl: ;  mometasone (NASONEX)   50 MCG/ACT nasal spray, Place 2 sprays into the nose daily., Disp: , Rfl: ;  Tamsulosin HCl (FLOMAX) 0.4 MG CAPS, Take 0.4 mg by mouth daily after supper., Disp: , Rfl:   BP 140/75  Pulse 70  Resp 16  Ht 5' 5" (1.651 m)  Wt 145 lb (65.772 kg)  BMI 24.13 kg/m2  SpO2 97%  Body mass index is 24.13 kg/(m^2).           Review of Systems denies chest pain, dyspnea on exertion, PND, orthopnea, hemoptysis, lateralizing weakness, aphasia. Does have leg discomfort with walking and some rash on the palms of both hands recently. All other systems negative a complete review of systems    Objective:   Physical Exam blood pressure 140/75 heart rate 70 respirations 16   Gen.-alert and oriented x3 in no apparent distress HEENT normal for age Lungs no rhonchi or wheezing Cardiovascular regular rhythm no murmurs carotid pulses  3+ palpable no bruits audible Abdomen soft nontender no palpable masses Musculoskeletal free of  major deformities Skin clear -no rashes Neurologic normal Lower extremities 3+ femoral pulses bilaterally-absent popliteal and distal pulses Both feet adequately perfused  Backordered bilateral vein mapping which are reviewed and interpreted. Saphenous vein is borderline bilaterally with a minimum size of 0.25 cm on the right and 0.31 cm on the left.  Assessment:     #1 bilateral superficial femoral occlusion with reconstitution of the above-knee popliteal artery and two-vessel runoff #2 history of coronary artery disease previous drug-eluting stent in 2012-followed by Dr. hochrein Discuss situation with Dr. hochrein and he will evaluate the patient preoperatively for cardiac clearance    Plan:     Plan right femoral-popliteal bypass graft with either saphenous vein or Gore-Tex on Thursday, July 17 pending cardiac clearance      

## 2013-01-27 ENCOUNTER — Encounter: Payer: Self-pay | Admitting: Physician Assistant

## 2013-01-27 ENCOUNTER — Ambulatory Visit (INDEPENDENT_AMBULATORY_CARE_PROVIDER_SITE_OTHER): Payer: BC Managed Care – PPO | Admitting: Physician Assistant

## 2013-01-27 VITALS — BP 138/71 | HR 72 | Ht 65.0 in | Wt 144.0 lb

## 2013-01-27 DIAGNOSIS — I739 Peripheral vascular disease, unspecified: Secondary | ICD-10-CM

## 2013-01-27 DIAGNOSIS — E785 Hyperlipidemia, unspecified: Secondary | ICD-10-CM

## 2013-01-27 DIAGNOSIS — I10 Essential (primary) hypertension: Secondary | ICD-10-CM

## 2013-01-27 DIAGNOSIS — Z01818 Encounter for other preprocedural examination: Secondary | ICD-10-CM

## 2013-01-27 DIAGNOSIS — I2510 Atherosclerotic heart disease of native coronary artery without angina pectoris: Secondary | ICD-10-CM

## 2013-01-27 NOTE — Patient Instructions (Addendum)
PLEASE SCHEDULE A LEXISCAN ASAP SURGERY SCHEDULED FOR NEXT WEEK; DX V72.81, 414.01  NO CHANGES WERE MADE TODAY

## 2013-01-27 NOTE — Progress Notes (Addendum)
[address] [place], [place]  [address] Phone: [phone] Fax:  [phone]  Date:  01/27/2013   ID:  Steven [name], Steven [name] 04-01-1946, MRN [id number]  PCP:  Sanda Linger, MD  Cardiologist:  Dr. Rollene Rotunda     History of Present Illness: Steven [name] is a 67 y.o. male who returns for surgical clearance.  He has bilateral superficial femoral occlusion.  He needs right fem-pop with Dr. Hart Rochester 02/06/2013.  He has a history of CAD, status post prior PCI in [place]. LHC 11/2010: EF 55%, mid LAD 75-80%, distal circumflex 30-40%, proximal RCA occluded with left to right collaterals. PCI: Promus DES to the LAD.  Other history includes DM2, HTN, HL, sickle cell trait.  He established with Dr. Antoine Poche 2013.  Last seen 11/18/12.  He is unable to walk more than 30 feet without stopping due to claudication.  He denies chest pain, dyspnea, syncope, orthopnea, PND, edema.    Labs (1/14):  TSH 1.11 Labs (5/14):  K 3.6, Cr 2.1=>1.30, Hgb 13.9   Wt Readings from Last 3 Encounters:  01/27/13 144 lb (65.318 kg)  01/21/13 145 lb (65.772 kg)  12/24/12 149 lb (67.586 kg)     Past Medical History  Diagnosis Date  . Coronary artery disease   . Diabetes mellitus without complication   . Dyslipidemia   . Hypertension   . Peripheral vascular disease   . Hernia   . Chest pain   . Heart murmur   . Allergy   . Hyperlipidemia   . Sickle cell anemia     has trait  . Irregular heartbeat   . GERD (gastroesophageal reflux disease)     Current Outpatient Prescriptions  Medication Sig Dispense Refill  . aspirin EC 81 MG tablet Take 81 mg by mouth daily.      Marland Kitchen atorvastatin (LIPITOR) 20 MG tablet Take 20 mg by mouth daily.      . cetirizine (ZYRTEC) 10 MG tablet Take 10 mg by mouth daily.      Marland Kitchen glucose blood (ACCU-CHEK COMFORT CURVE) test strip Test up to twice daily Dx:250.42  300 each  4  . hydrochlorothiazide (MICROZIDE) 12.5 MG capsule Take 2 capsules (25 mg total) by  mouth daily.  180 capsule  3  . Hydrocod Polst-Chlorphen Polst 10-8 MG CP12 Take 1 tablet by mouth 2 (two) times daily as needed.  25 each  1  . hydrocortisone (ANUSOL-HC) 2.5 % rectal cream Place 1 application rectally 2 (two) times daily as needed. For hemorrhoids      . hydrocortisone 2.5 % cream Apply topically 2 (two) times daily.  30 g  11  . insulin detemir (LEVEMIR FLEXPEN) 100 UNIT/ML injection Inject 0.3 mLs (30 Units total) into the skin at bedtime.  9 mL  12  . Insulin Pen Needle 32G X 4 MM MISC Use as directed with insulin  30 each  0  . Lancets (ACCU-CHEK SOFT TOUCH) lancets Test up to BID. Dx:250.42  100 each  4  . losartan (COZAAR) 100 MG tablet Take 100 mg by mouth daily.      . metFORMIN (GLUCOPHAGE) 1000 MG tablet Take 1,000 mg by mouth 2 (two) times daily with a meal.      . mometasone (NASONEX) 50 MCG/ACT nasal spray Place 2 sprays into the nose daily.      . Tamsulosin HCl (FLOMAX) 0.4 MG CAPS Take 0.4 mg by mouth daily after supper.  No current facility-administered medications for this visit.    Allergies:   No Known Allergies  Social History:  The patient  reports that he has quit smoking. His smoking use included Cigarettes. He smoked 0.00 packs per day. He has never used smokeless tobacco. He reports that he drinks about 1.8 ounces of alcohol per week. He reports that he does not use illicit drugs.   ROS:  Please see the history of present illness.      All other systems reviewed and negative.   PHYSICAL EXAM: VS:  BP 138/71  Pulse 72  Ht 5\' 5"  (1.651 m)  Wt 144 lb (65.318 kg)  BMI 23.96 kg/m2 Well nourished, well developed, in no acute distress HEENT: normal Neck: no JVD Vascular:  No carotid bruits Cardiac:  normal S1, S2; RRR; no murmur Lungs:  clear to auscultation bilaterally, no wheezing, rhonchi or rales Abd: soft, nontender, no hepatomegaly Ext: no edema Skin: warm and dry Neuro:  CNs 2-12 intact, no focal abnormalities noted  EKG:  NSR,  HR 72, LVH with repol abnormality     ASSESSMENT AND PLAN:  1. Surgical Clearance:  Vascular surgery is high risk.  It has been 2 years since his PCI.  He has diabetes.  He cannot achieve 4 METs due to limited mobility from PAD.  I will arrange Lexiscan Myoview for risk stratification.  Of note, he has a chronically occluded RCA.  Therefore, we know his scan will be abnormal.  However, we need to rule out high risk features in LAD distribution.  2. CAD:  Proceed with myoview as noted.  Continue ASA and statin. 3. Hypertension:  Controlled.  Continue current therapy.  4. Hyperlipidemia:  Continue statin. 5. PAD:  Managed by [facility].  Revascularization surgery planned next week. 6. Disposition:  F/u as planned with Dr. Rollene Rotunda or sooner if [facility] [place] [facility] is abnormal.    Signed, Tereso Newcomer, PA-C  01/27/2013 11:02 AM    Addendum 01/30/2013 1:26 PM: Eugenie Birks Myoview 7/14:  Inf scar with minimal reversibility, inf HK, EF 45%;  Low risk As myoview is low risk, patient is at acceptable risk for his surgery.  No further cardiac testing needed.   Our service is available in the perioperative period as necessary. Signed,  Tereso Newcomer, PA-C   01/30/2013 1:28 PM

## 2013-01-29 ENCOUNTER — Ambulatory Visit (HOSPITAL_COMMUNITY): Payer: BC Managed Care – PPO | Attending: Cardiology | Admitting: Radiology

## 2013-01-29 ENCOUNTER — Encounter (HOSPITAL_COMMUNITY): Payer: Self-pay | Admitting: Respiratory Therapy

## 2013-01-29 VITALS — BP 147/62 | HR 54 | Ht 65.0 in | Wt 144.0 lb

## 2013-01-29 DIAGNOSIS — I2510 Atherosclerotic heart disease of native coronary artery without angina pectoris: Secondary | ICD-10-CM

## 2013-01-29 DIAGNOSIS — I739 Peripheral vascular disease, unspecified: Secondary | ICD-10-CM | POA: Insufficient documentation

## 2013-01-29 DIAGNOSIS — Z01818 Encounter for other preprocedural examination: Secondary | ICD-10-CM

## 2013-01-29 DIAGNOSIS — Z01810 Encounter for preprocedural cardiovascular examination: Secondary | ICD-10-CM | POA: Insufficient documentation

## 2013-01-29 DIAGNOSIS — I10 Essential (primary) hypertension: Secondary | ICD-10-CM | POA: Insufficient documentation

## 2013-01-29 DIAGNOSIS — E119 Type 2 diabetes mellitus without complications: Secondary | ICD-10-CM | POA: Insufficient documentation

## 2013-01-29 DIAGNOSIS — Z794 Long term (current) use of insulin: Secondary | ICD-10-CM | POA: Insufficient documentation

## 2013-01-29 MED ORDER — TECHNETIUM TC 99M SESTAMIBI GENERIC - CARDIOLITE
11.0000 | Freq: Once | INTRAVENOUS | Status: AC | PRN
  Administered 2013-01-29: 11 via INTRAVENOUS

## 2013-01-29 MED ORDER — REGADENOSON 0.4 MG/5ML IV SOLN
0.4000 mg | Freq: Once | INTRAVENOUS | Status: AC
  Administered 2013-01-29: 0.4 mg via INTRAVENOUS

## 2013-01-29 MED ORDER — TECHNETIUM TC 99M SESTAMIBI GENERIC - CARDIOLITE
33.0000 | Freq: Once | INTRAVENOUS | Status: AC | PRN
  Administered 2013-01-29: 33 via INTRAVENOUS

## 2013-01-29 NOTE — Progress Notes (Signed)
[facility] SITE 3 NUCLEAR MED [address] [place], [place] [address] [phone]    Cardiology Nuclear Med Study  Steven [name] is a 67 y.o. male     MRN : [id number]     DOB: April 25, 1946  Procedure Date: 01/29/2013  Nuclear Med Background Indication for Stress Test:  Evaluation for Ischemia and Pending Clearance for Bifem-Pop Graft with Dr. Josephina Gip  History:  '12 Stent-LAD; '12 Echo:normal LVF Cardiac Risk Factors: History of Smoking, Hypertension, IDDM Type 2, Lipids and PVD  Symptoms:  No cardiac symptom noted today.   Nuclear Pre-Procedure Caffeine/Decaff Intake:  None NPO After: 7:00am   Lungs:  Clear. O2 Sat: 98% on room air. IV 0.9% NS with Angio Cath:  20g  IV Site: R Antecubital  IV Started by:  Bonnita Levan, RN  Chest Size (in):  42 Cup Size: n/a  Height: 5\' 5"  (1.651 m)  Weight:  144 lb (65.318 kg)  BMI:  Body mass index is 23.96 kg/(m^2). Tech Comments:  BS @ 6:30 am=110    Nuclear Med Study 1 or 2 day study: 1 day  Stress Test Type:  Lexiscan  Reading MD: Cassell Clement, MD  Order Authorizing Provider:  Rollene Rotunda, MD  Resting Radionuclide: Technetium 34m Sestamibi  Resting Radionuclide Dose: 11.0 mCi   Stress Radionuclide:  Technetium 42m Sestamibi  Stress Radionuclide Dose: 33.0 mCi           Stress Protocol Rest HR: 54 Stress HR: 105  Rest BP: 147/62 Stress BP: 168/56  Exercise Time (min): n/a METS: n/a   Predicted Max HR: 156 bpm % Max HR: 67.31 bpm Rate Pressure Product: 95284   Dose of Adenosine (mg):  n/a Dose of Lexiscan: 0.4 mg  Dose of Atropine (mg): n/a Dose of Dobutamine: n/a mcg/kg/min (at max HR)  Stress Test Technologist: Smiley Houseman, CMA-N  Nuclear Technologist:  Doyne Keel, CNMT     Rest Procedure:  Myocardial perfusion imaging was performed at rest 45 minutes following the intravenous administration of Technetium 15m Sestamibi.  Rest ECG: NSR with non-specific ST-T wave changes  Stress Procedure:  The  patient received IV Lexiscan 0.4 mg over 15-seconds.  Technetium 84m Sestamibi injected at 30-seconds.  Quantitative spect images were obtained after a 45 minute delay.  Stress ECG: No significant change from baseline ECG  QPS Raw Data Images:  There is interference from nuclear activity from structures below the diaphragm. This does not affect the ability to read the study. Stress Images:  There is decreased uptake in the inferior wall. Rest Images:  There is decreased uptake in the inferior wall. Subtraction (SDS):  There is a fixed defect that is most consistent with a previous infarction. Transient Ischemic Dilatation (Normal <1.22):  n/a Lung/Heart Ratio (Normal <0.45):  0.27  Quantitative Gated Spect Images QGS EDV:  110 ml QGS ESV:  60 ml  Impression Exercise Capacity:  Lexiscan with no exercise. BP Response:  Normal blood pressure response. Clinical Symptoms:  No significant symptoms noted. ECG Impression:  No significant ST segment change suggestive of ischemia. Comparison with Prior Nuclear Study: No previous nuclear study performed  Overall Impression:  Low risk stress nuclear study.  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  There is minimal reversibility. There is mild decrease of LV systolic function with mild inferior wall hypokinesis.  LV Ejection Fraction: 45%.  LV Wall Motion:  Mild inferior wall hypokinesis  Cassell Clement

## 2013-01-30 ENCOUNTER — Telehealth: Payer: Self-pay | Admitting: *Deleted

## 2013-01-30 ENCOUNTER — Encounter: Payer: Self-pay | Admitting: Physician Assistant

## 2013-01-30 NOTE — Telephone Encounter (Signed)
lmptcb go over myoview results and the name of surgeon( Dr. Hart Rochester) to fax OV and myoview results to.

## 2013-01-30 NOTE — Telephone Encounter (Signed)
Message copied by Tarri Fuller on Thu Jan 30, 2013  4:18 PM ------      Message from: [place], [place] T      Created: Thu Jan 30, 2013  1:29 PM       Low risk stress test      Ok to proceed with surgery      Please notify patient      Send my office note and stress test report to surgeon      Tereso Newcomer, PA-C        01/30/2013 1:29 PM ------

## 2013-01-30 NOTE — Telephone Encounter (Signed)
pt notified about myoview results with verbal understanding 

## 2013-02-03 ENCOUNTER — Encounter (HOSPITAL_COMMUNITY): Payer: Self-pay

## 2013-02-03 ENCOUNTER — Other Ambulatory Visit: Payer: Self-pay

## 2013-02-03 ENCOUNTER — Encounter (HOSPITAL_COMMUNITY)
Admission: RE | Admit: 2013-02-03 | Discharge: 2013-02-03 | Disposition: A | Discharge status: Home / Self Care | Payer: BC Managed Care – PPO | Source: Ambulatory Visit | Attending: Vascular Surgery | Admitting: Vascular Surgery

## 2013-02-03 DIAGNOSIS — Z01818 Encounter for other preprocedural examination: Secondary | ICD-10-CM | POA: Insufficient documentation

## 2013-02-03 DIAGNOSIS — Z01812 Encounter for preprocedural laboratory examination: Secondary | ICD-10-CM | POA: Insufficient documentation

## 2013-02-03 LAB — URINALYSIS, ROUTINE W REFLEX MICROSCOPIC
Leukocytes, UA: NEGATIVE
Nitrite: NEGATIVE
Specific Gravity, Urine: 1.014 (ref 1.005–1.030)
pH: 5.5 (ref 5.0–8.0)

## 2013-02-03 LAB — COMPREHENSIVE METABOLIC PANEL
AST: 22 U/L (ref 0–37)
CO2: 26 mEq/L (ref 19–32)
Chloride: 95 mEq/L — ABNORMAL LOW (ref 96–112)
Creatinine, Ser: 1.7 mg/dL — ABNORMAL HIGH (ref 0.50–1.35)
GFR calc non Af Amer: 41 mL/min — ABNORMAL LOW (ref >90)
Glucose, Bld: 203 mg/dL — ABNORMAL HIGH (ref 70–99)
Total Bilirubin: 0.4 mg/dL (ref 0.3–1.2)

## 2013-02-03 LAB — SURGICAL PCR SCREEN
MRSA, PCR: NEGATIVE
Staphylococcus aureus: NEGATIVE

## 2013-02-03 LAB — PROTIME-INR: INR: 1.07 (ref 0.00–1.49)

## 2013-02-03 LAB — CBC
HCT: 35.3 % — ABNORMAL LOW (ref 39.0–52.0)
Hemoglobin: 12.8 g/dL — ABNORMAL LOW (ref 13.0–17.0)
MCV: 82.9 fL (ref 78.0–100.0)
Platelets: 166 10*3/uL (ref 150–400)
RBC: 4.26 MIL/uL (ref 4.22–5.81)
WBC: 7.8 10*3/uL (ref 4.0–10.5)

## 2013-02-03 LAB — PREPARE RBC (CROSSMATCH)

## 2013-02-03 NOTE — Progress Notes (Signed)
Dr Massageenotified of creatinine of 1.70, no new orders.

## 2013-02-03 NOTE — Pre-Procedure Instructions (Signed)
Steven [name]  02/03/2013   Your procedure is scheduled on:  02/07/2013  Report to [facility] at 5:30 AM.  Call this number if you have problems the morning of surgery: [phone]   Remember:   Do not eat food or drink liquids after midnight. Thursday   Take these medicines the morning of surgery with A SIP OF WATER: Zyrtec   Do not wear jewelry  Do not wear lotions, powders, or perfumes. You may wear deodorant.             Men may shave face and neck.  Do not bring valuables to the hospital.  [facility] is not responsible  for any belongings or valuables.  Contacts, dentures or bridgework may not be worn into surgery.  Leave suitcase in the car. After surgery it may be brought to your room.  For patients admitted to the hospital, checkout time is 11:00 AM the day of  discharge.   Patients discharged the day of surgery will not be allowed to drive  home.  Name and phone number of your driver: /w family  Special Instructions: Shower using CHG 2 nights before surgery and the night before surgery.  If you shower the day of surgery use CHG.  Use special wash - you have one bottle of CHG for all showers.  You should use approximately 1/3 of the bottle for each shower.   Please read over the following fact sheets that you were given: Pain Booklet, Coughing and Deep Breathing, Blood Transfusion Information, MRSA Information and Surgical Site Infection Prevention

## 2013-02-03 NOTE — Progress Notes (Signed)
Sleep assessment indicates reason for evaluation.  Mrs. Mccartt says that pt's  snoring is heard " down the hallway" & he sometimes stops breathing".

## 2013-02-04 ENCOUNTER — Encounter (HOSPITAL_COMMUNITY): Payer: Self-pay

## 2013-02-04 LAB — ABO/RH: ABO/RH(D): B POS

## 2013-02-04 NOTE — Progress Notes (Signed)
Anesthesia Chart Review:  Patient is a 67 year old male scheduled for right FPBG on 02/07/13 by Dr. Hart Rochester.  History includes PAD, former smoker, CAD s/p DES LAD '02 in [place], chronic SOB, DM2, dyslipidemia, HTN, HLD, sickle cell trait, GERD, inguinal hernia repair. PCP is Dr. Sanda Linger.  Cardiologist is Dr. Antoine Poche.  (He was previously followed by Dr. Raynald Blend at [facility] in [place], [place] with cardiac cath in 2012 at [facility].  Pertinent cardiology records from [place] have already been scanned under the Media and Results Review tabs by [facility].)  EKG on 01/27/13 showed NSR, LVH with repolarization abnormality (lateral T wave inversion).  Nuclear stress test on 01/30/13 showed: Low risk stress nuclear study. 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 There is minimal reversibility. There is mild decrease of LV systolic function with mild inferior wall hypokinesis. LV Ejection Fraction: 45%. LV Wall Motion: Mild inferior wall hypokinesis.  Results were reviewed and ultimately he was felt ok to proceed with surgery without additional cardiac testing.  Left cardiac cath on 12/05/10 showed EF 55%, mid LAD 75-80% s/p DES to the LAD, distal circumflex 30-40%, proximal RCA occluded with left to right collaterals.   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  Normal LV systolic function.  Abnormal LV filling pattern for age.  Trace MR/AR.  CXR on 11/29/12 showed: Hyperinflation consistent with COPD and/or asthma. Linear scarring in the lingula and left lower lobe. Pleural scarring suspected at both bases with blunting of the costophrenic angles. No acute cardiopulmonary disease suspected.  Preoperative labs noted.  Cr 1.70, previously 1.3 - 2.1 in May  2014.  Dr. Hart Rochester can follow renal function in the post-operative period.  He will get a CBG on arrival.  If no acute changes then I would anticipate that he could proceed as planned.    Velna Ochs [facility]/Anesthesiology Phone [phone] 02/04/2013 3:48 PM

## 2013-02-06 MED ORDER — DEXTROSE 5 % IV SOLN
1.5000 g | INTRAVENOUS | Status: AC
  Administered 2013-02-07: 1.5 g via INTRAVENOUS
  Filled 2013-02-06: qty 1.5

## 2013-02-07 ENCOUNTER — Inpatient Hospital Stay (HOSPITAL_COMMUNITY)
Admission: RE | Admit: 2013-02-07 | Discharge: 2013-02-10 | DRG: 796 | Disposition: A | Discharge status: Home / Self Care | Payer: BC Managed Care – PPO | Source: Ambulatory Visit | Attending: Vascular Surgery | Admitting: Vascular Surgery

## 2013-02-07 ENCOUNTER — Ambulatory Visit (HOSPITAL_COMMUNITY): Payer: BC Managed Care – PPO | Admitting: Certified Registered Nurse Anesthetist

## 2013-02-07 ENCOUNTER — Telehealth: Payer: Self-pay | Admitting: Vascular Surgery

## 2013-02-07 ENCOUNTER — Encounter (HOSPITAL_COMMUNITY)
Admission: RE | Disposition: A | Discharge status: Home / Self Care | Payer: Self-pay | Source: Ambulatory Visit | Attending: Vascular Surgery

## 2013-02-07 ENCOUNTER — Encounter (HOSPITAL_COMMUNITY): Payer: Self-pay | Admitting: Vascular Surgery

## 2013-02-07 ENCOUNTER — Encounter (HOSPITAL_COMMUNITY): Payer: Self-pay | Admitting: *Deleted

## 2013-02-07 DIAGNOSIS — E139 Other specified diabetes mellitus without complications: Secondary | ICD-10-CM

## 2013-02-07 DIAGNOSIS — I70219 Atherosclerosis of native arteries of extremities with intermittent claudication, unspecified extremity: Principal | ICD-10-CM | POA: Diagnosis present

## 2013-02-07 DIAGNOSIS — I739 Peripheral vascular disease, unspecified: Secondary | ICD-10-CM

## 2013-02-07 DIAGNOSIS — N189 Chronic kidney disease, unspecified: Secondary | ICD-10-CM | POA: Diagnosis present

## 2013-02-07 DIAGNOSIS — Z833 Family history of diabetes mellitus: Secondary | ICD-10-CM

## 2013-02-07 DIAGNOSIS — J4489 Other specified chronic obstructive pulmonary disease: Secondary | ICD-10-CM | POA: Diagnosis present

## 2013-02-07 DIAGNOSIS — I10 Essential (primary) hypertension: Secondary | ICD-10-CM

## 2013-02-07 DIAGNOSIS — E785 Hyperlipidemia, unspecified: Secondary | ICD-10-CM | POA: Diagnosis present

## 2013-02-07 DIAGNOSIS — E109 Type 1 diabetes mellitus without complications: Secondary | ICD-10-CM | POA: Diagnosis present

## 2013-02-07 DIAGNOSIS — J449 Chronic obstructive pulmonary disease, unspecified: Secondary | ICD-10-CM | POA: Diagnosis present

## 2013-02-07 DIAGNOSIS — D571 Sickle-cell disease without crisis: Secondary | ICD-10-CM | POA: Diagnosis present

## 2013-02-07 DIAGNOSIS — I129 Hypertensive chronic kidney disease with stage 1 through stage 4 chronic kidney disease, or unspecified chronic kidney disease: Secondary | ICD-10-CM | POA: Diagnosis present

## 2013-02-07 DIAGNOSIS — D62 Acute posthemorrhagic anemia: Secondary | ICD-10-CM | POA: Diagnosis present

## 2013-02-07 DIAGNOSIS — L219 Seborrheic dermatitis, unspecified: Secondary | ICD-10-CM | POA: Diagnosis present

## 2013-02-07 DIAGNOSIS — Z794 Long term (current) use of insulin: Secondary | ICD-10-CM

## 2013-02-07 DIAGNOSIS — Z87891 Personal history of nicotine dependence: Secondary | ICD-10-CM

## 2013-02-07 DIAGNOSIS — K219 Gastro-esophageal reflux disease without esophagitis: Secondary | ICD-10-CM | POA: Diagnosis present

## 2013-02-07 DIAGNOSIS — Z7982 Long term (current) use of aspirin: Secondary | ICD-10-CM

## 2013-02-07 DIAGNOSIS — I2510 Atherosclerotic heart disease of native coronary artery without angina pectoris: Secondary | ICD-10-CM | POA: Diagnosis present

## 2013-02-07 HISTORY — PX: INTRAOPERATIVE ARTERIOGRAM: SHX5157

## 2013-02-07 HISTORY — PX: FEMORAL-POPLITEAL BYPASS GRAFT: SHX937

## 2013-02-07 LAB — CBC
HCT: 32 % — ABNORMAL LOW (ref 39.0–52.0)
Hemoglobin: 11.3 g/dL — ABNORMAL LOW (ref 13.0–17.0)
MCH: 29.8 pg (ref 26.0–34.0)
MCHC: 35.3 g/dL (ref 30.0–36.0)
MCV: 84.4 fL (ref 78.0–100.0)

## 2013-02-07 LAB — GLUCOSE, CAPILLARY
Glucose-Capillary: 105 mg/dL — ABNORMAL HIGH (ref 70–99)
Glucose-Capillary: 139 mg/dL — ABNORMAL HIGH (ref 70–99)

## 2013-02-07 LAB — HEMOGLOBIN A1C
Hgb A1c MFr Bld: 6.3 % — ABNORMAL HIGH (ref <5.7)
Mean Plasma Glucose: 134 mg/dL — ABNORMAL HIGH (ref <117)

## 2013-02-07 SURGERY — BYPASS GRAFT FEMORAL-POPLITEAL ARTERY
Anesthesia: General | Site: Leg Upper | Laterality: Right | Wound class: Clean

## 2013-02-07 MED ORDER — PHENOL 1.4 % MT LIQD: 1.0000 | OROMUCOSAL | Status: DC | PRN

## 2013-02-07 MED ORDER — HYDROMORPHONE HCL PF 1 MG/ML IJ SOLN
INTRAMUSCULAR | Status: AC
  Filled 2013-02-07: qty 1

## 2013-02-07 MED ORDER — ONDANSETRON HCL 4 MG/2ML IJ SOLN
INTRAMUSCULAR | Status: DC | PRN
  Administered 2013-02-07: 4 mg via INTRAVENOUS

## 2013-02-07 MED ORDER — SODIUM CHLORIDE 0.9 % IV SOLN
INTRAVENOUS | Status: DC | PRN
  Administered 2013-02-07: 08:00:00 via INTRAVENOUS

## 2013-02-07 MED ORDER — ACETAMINOPHEN 325 MG PO TABS
325.0000 mg | ORAL_TABLET | ORAL | Status: DC | PRN
  Administered 2013-02-08: 650 mg via ORAL
  Filled 2013-02-07: qty 2

## 2013-02-07 MED ORDER — HYDROCORTISONE 2.5 % RE CREA
1.0000 "application " | TOPICAL_CREAM | Freq: Two times a day (BID) | RECTAL | Status: DC | PRN
  Filled 2013-02-07: qty 28.35

## 2013-02-07 MED ORDER — SODIUM CHLORIDE 0.9 % IV SOLN: 500.0000 mL | Freq: Once | INTRAVENOUS | Status: AC | PRN

## 2013-02-07 MED ORDER — ASPIRIN EC 81 MG PO TBEC
81.0000 mg | DELAYED_RELEASE_TABLET | Freq: Every day | ORAL | Status: DC
  Administered 2013-02-07 – 2013-02-10 (×4): 81 mg via ORAL
  Filled 2013-02-07 (×4): qty 1

## 2013-02-07 MED ORDER — PROTAMINE SULFATE 10 MG/ML IV SOLN
INTRAVENOUS | Status: DC | PRN
  Administered 2013-02-07: 50 mg via INTRAVENOUS

## 2013-02-07 MED ORDER — GLYCOPYRROLATE 0.2 MG/ML IJ SOLN
INTRAMUSCULAR | Status: DC | PRN
  Administered 2013-02-07: 0.2 mg via INTRAVENOUS
  Administered 2013-02-07: 0.4 mg via INTRAVENOUS

## 2013-02-07 MED ORDER — LABETALOL HCL 5 MG/ML IV SOLN
10.0000 mg | INTRAVENOUS | Status: DC | PRN
  Filled 2013-02-07: qty 4

## 2013-02-07 MED ORDER — DOCUSATE SODIUM 100 MG PO CAPS
100.0000 mg | ORAL_CAPSULE | Freq: Every day | ORAL | Status: DC
  Administered 2013-02-08 – 2013-02-10 (×3): 100 mg via ORAL
  Filled 2013-02-07 (×3): qty 1

## 2013-02-07 MED ORDER — HYDROCHLOROTHIAZIDE 12.5 MG PO CAPS
25.0000 mg | ORAL_CAPSULE | Freq: Every day | ORAL | Status: DC
  Administered 2013-02-08 – 2013-02-10 (×3): 25 mg via ORAL
  Filled 2013-02-07 (×4): qty 2

## 2013-02-07 MED ORDER — PHENYLEPHRINE HCL 10 MG/ML IJ SOLN
INTRAMUSCULAR | Status: DC | PRN
  Administered 2013-02-07: 40 ug via INTRAVENOUS
  Administered 2013-02-07: 80 ug via INTRAVENOUS

## 2013-02-07 MED ORDER — POTASSIUM CHLORIDE CRYS ER 20 MEQ PO TBCR: 20.0000 meq | EXTENDED_RELEASE_TABLET | Freq: Once | ORAL | Status: AC | PRN

## 2013-02-07 MED ORDER — HYDROMORPHONE HCL PF 1 MG/ML IJ SOLN
0.2500 mg | INTRAMUSCULAR | Status: DC | PRN
  Administered 2013-02-07 (×2): 0.5 mg via INTRAVENOUS

## 2013-02-07 MED ORDER — HYDRALAZINE HCL 20 MG/ML IJ SOLN: 10.0000 mg | INTRAMUSCULAR | Status: DC | PRN

## 2013-02-07 MED ORDER — HYDROCORTISONE 2.5 % EX CREA
TOPICAL_CREAM | Freq: Two times a day (BID) | CUTANEOUS | Status: DC
  Filled 2013-02-07 (×18): qty 1

## 2013-02-07 MED ORDER — LORATADINE 10 MG PO TABS
10.0000 mg | ORAL_TABLET | Freq: Every day | ORAL | Status: DC
  Administered 2013-02-07 – 2013-02-09 (×3): 10 mg via ORAL
  Filled 2013-02-07 (×4): qty 1

## 2013-02-07 MED ORDER — METOPROLOL TARTRATE 1 MG/ML IV SOLN: 2.0000 mg | INTRAVENOUS | Status: DC | PRN

## 2013-02-07 MED ORDER — FENTANYL CITRATE 0.05 MG/ML IJ SOLN
INTRAMUSCULAR | Status: DC | PRN
  Administered 2013-02-07 (×2): 50 ug via INTRAVENOUS
  Administered 2013-02-07: 100 ug via INTRAVENOUS
  Administered 2013-02-07: 50 ug via INTRAVENOUS

## 2013-02-07 MED ORDER — MIDAZOLAM HCL 5 MG/5ML IJ SOLN
INTRAMUSCULAR | Status: DC | PRN
  Administered 2013-02-07: 1 mg via INTRAVENOUS

## 2013-02-07 MED ORDER — TAMSULOSIN HCL 0.4 MG PO CAPS
0.4000 mg | ORAL_CAPSULE | Freq: Every day | ORAL | Status: DC
  Administered 2013-02-07 – 2013-02-09 (×3): 0.4 mg via ORAL
  Filled 2013-02-07 (×4): qty 1

## 2013-02-07 MED ORDER — MORPHINE SULFATE 2 MG/ML IJ SOLN
2.0000 mg | INTRAMUSCULAR | Status: DC | PRN
  Administered 2013-02-07 – 2013-02-08 (×5): 2 mg via INTRAVENOUS
  Filled 2013-02-07 (×4): qty 1

## 2013-02-07 MED ORDER — ZOLPIDEM TARTRATE 5 MG PO TABS: 5.0000 mg | ORAL_TABLET | Freq: Every evening | ORAL | Status: DC | PRN

## 2013-02-07 MED ORDER — LIDOCAINE HCL 4 % MT SOLN
OROMUCOSAL | Status: DC | PRN
  Administered 2013-02-07: 4 mL via TOPICAL

## 2013-02-07 MED ORDER — VECURONIUM BROMIDE 10 MG IV SOLR
INTRAVENOUS | Status: DC | PRN
  Administered 2013-02-07: 2 mg via INTRAVENOUS

## 2013-02-07 MED ORDER — OXYCODONE HCL 5 MG PO TABS
5.0000 mg | ORAL_TABLET | ORAL | Status: DC | PRN
  Administered 2013-02-08 – 2013-02-09 (×4): 10 mg via ORAL
  Administered 2013-02-09: 5 mg via ORAL
  Administered 2013-02-09: 10 mg via ORAL
  Administered 2013-02-09: 5 mg via ORAL
  Administered 2013-02-10: 10 mg via ORAL
  Filled 2013-02-07 (×8): qty 2

## 2013-02-07 MED ORDER — LOSARTAN POTASSIUM 50 MG PO TABS
100.0000 mg | ORAL_TABLET | Freq: Every day | ORAL | Status: DC
  Administered 2013-02-08 – 2013-02-10 (×3): 100 mg via ORAL
  Filled 2013-02-07 (×4): qty 2

## 2013-02-07 MED ORDER — GUAIFENESIN-DM 100-10 MG/5ML PO SYRP: 15.0000 mL | ORAL_SOLUTION | ORAL | Status: DC | PRN

## 2013-02-07 MED ORDER — SODIUM CHLORIDE 0.9 % IR SOLN
Status: DC | PRN
  Administered 2013-02-07: 09:00:00

## 2013-02-07 MED ORDER — ACETAMINOPHEN 650 MG RE SUPP: 325.0000 mg | RECTAL | Status: DC | PRN

## 2013-02-07 MED ORDER — PANTOPRAZOLE SODIUM 40 MG PO TBEC
40.0000 mg | DELAYED_RELEASE_TABLET | Freq: Every day | ORAL | Status: DC
  Administered 2013-02-07 – 2013-02-10 (×4): 40 mg via ORAL
  Filled 2013-02-07 (×4): qty 1

## 2013-02-07 MED ORDER — ROCURONIUM BROMIDE 100 MG/10ML IV SOLN
INTRAVENOUS | Status: DC | PRN
  Administered 2013-02-07: 50 mg via INTRAVENOUS

## 2013-02-07 MED ORDER — LIDOCAINE HCL (CARDIAC) 20 MG/ML IV SOLN
INTRAVENOUS | Status: DC | PRN
  Administered 2013-02-07: 80 mg via INTRAVENOUS

## 2013-02-07 MED ORDER — OXYCODONE HCL 5 MG PO TABS: 5.0000 mg | ORAL_TABLET | Freq: Four times a day (QID) | ORAL | Status: DC | PRN

## 2013-02-07 MED ORDER — ALUM & MAG HYDROXIDE-SIMETH 200-200-20 MG/5ML PO SUSP: 15.0000 mL | ORAL | Status: DC | PRN

## 2013-02-07 MED ORDER — PHENYLEPHRINE HCL 10 MG/ML IJ SOLN
10.0000 mg | INTRAMUSCULAR | Status: DC | PRN
  Administered 2013-02-07: 25 ug/min via INTRAVENOUS

## 2013-02-07 MED ORDER — ONDANSETRON HCL 4 MG/2ML IJ SOLN: 4.0000 mg | Freq: Once | INTRAMUSCULAR | Status: DC | PRN

## 2013-02-07 MED ORDER — SODIUM CHLORIDE 0.9 % IV SOLN: INTRAVENOUS | Status: DC

## 2013-02-07 MED ORDER — LACTATED RINGERS IV SOLN
INTRAVENOUS | Status: DC | PRN
  Administered 2013-02-07 (×2): via INTRAVENOUS

## 2013-02-07 MED ORDER — ATORVASTATIN CALCIUM 20 MG PO TABS
20.0000 mg | ORAL_TABLET | Freq: Every day | ORAL | Status: DC
  Administered 2013-02-08 – 2013-02-10 (×3): 20 mg via ORAL
  Filled 2013-02-07 (×4): qty 1

## 2013-02-07 MED ORDER — NEOSTIGMINE METHYLSULFATE 1 MG/ML IJ SOLN
INTRAMUSCULAR | Status: DC | PRN
  Administered 2013-02-07: 3 mg via INTRAVENOUS

## 2013-02-07 MED ORDER — FLUTICASONE PROPIONATE 50 MCG/ACT NA SUSP
1.0000 | Freq: Every day | NASAL | Status: DC
  Administered 2013-02-07 – 2013-02-10 (×4): 1 via NASAL
  Filled 2013-02-07: qty 16

## 2013-02-07 MED ORDER — MORPHINE SULFATE 2 MG/ML IJ SOLN
INTRAMUSCULAR | Status: AC
  Filled 2013-02-07: qty 1

## 2013-02-07 MED ORDER — ENOXAPARIN SODIUM 30 MG/0.3ML ~~LOC~~ SOLN
30.0000 mg | SUBCUTANEOUS | Status: DC
  Administered 2013-02-08: 30 mg via SUBCUTANEOUS
  Filled 2013-02-07 (×2): qty 0.3

## 2013-02-07 MED ORDER — 0.9 % SODIUM CHLORIDE (POUR BTL) OPTIME
TOPICAL | Status: DC | PRN
  Administered 2013-02-07: 3000 mL

## 2013-02-07 MED ORDER — INSULIN DETEMIR 100 UNIT/ML ~~LOC~~ SOLN
30.0000 [IU] | Freq: Every day | SUBCUTANEOUS | Status: DC
  Administered 2013-02-07 – 2013-02-09 (×3): 30 [IU] via SUBCUTANEOUS
  Filled 2013-02-07 (×4): qty 0.3

## 2013-02-07 MED ORDER — SODIUM CHLORIDE 0.9 % IV SOLN
INTRAVENOUS | Status: DC
  Administered 2013-02-07: 16:00:00 via INTRAVENOUS

## 2013-02-07 MED ORDER — HEPARIN SODIUM (PORCINE) 1000 UNIT/ML IJ SOLN
INTRAMUSCULAR | Status: DC | PRN
  Administered 2013-02-07: 6000 [IU] via INTRAVENOUS

## 2013-02-07 MED ORDER — DOPAMINE-DEXTROSE 3.2-5 MG/ML-% IV SOLN: 3.0000 ug/kg/min | INTRAVENOUS | Status: DC

## 2013-02-07 MED ORDER — INSULIN ASPART 100 UNIT/ML ~~LOC~~ SOLN
0.0000 [IU] | Freq: Three times a day (TID) | SUBCUTANEOUS | Status: DC
  Administered 2013-02-07: 1 [IU] via SUBCUTANEOUS
  Administered 2013-02-08 (×2): 2 [IU] via SUBCUTANEOUS
  Administered 2013-02-09: 3 [IU] via SUBCUTANEOUS
  Administered 2013-02-09: 1 [IU] via SUBCUTANEOUS

## 2013-02-07 MED ORDER — METFORMIN HCL 500 MG PO TABS: 1000.0000 mg | ORAL_TABLET | Freq: Two times a day (BID) | ORAL | Status: DC

## 2013-02-07 MED ORDER — ONDANSETRON HCL 4 MG/2ML IJ SOLN: 4.0000 mg | Freq: Four times a day (QID) | INTRAMUSCULAR | Status: DC | PRN

## 2013-02-07 MED ORDER — DEXTROSE 5 % IV SOLN
1.5000 g | Freq: Two times a day (BID) | INTRAVENOUS | Status: AC
  Administered 2013-02-07 – 2013-02-08 (×2): 1.5 g via INTRAVENOUS
  Filled 2013-02-07 (×2): qty 1.5

## 2013-02-07 MED ORDER — IOHEXOL 300 MG/ML  SOLN
INTRAMUSCULAR | Status: DC | PRN
  Administered 2013-02-07: 30 mL via INTRAVENOUS

## 2013-02-07 MED ORDER — PROPOFOL 10 MG/ML IV BOLUS
INTRAVENOUS | Status: DC | PRN
  Administered 2013-02-07: 160 mg via INTRAVENOUS

## 2013-02-07 SURGICAL SUPPLY — 58 items
BANDAGE ESMARK 6X9 LF (GAUZE/BANDAGES/DRESSINGS) IMPLANT
BNDG ESMARK 6X9 LF (GAUZE/BANDAGES/DRESSINGS)
BOOT SUTURE AID YELLOW STND (SUTURE) IMPLANT
CANISTER SUCTION 2500CC (MISCELLANEOUS) ×3 IMPLANT
CLIP TI MEDIUM 24 (CLIP) ×3 IMPLANT
CLIP TI WIDE RED SMALL 24 (CLIP) ×3 IMPLANT
CLOTH BEACON ORANGE TIMEOUT ST (SAFETY) ×3 IMPLANT
COVER PROBE W GEL 5X96 (DRAPES) ×3 IMPLANT
COVER SURGICAL LIGHT HANDLE (MISCELLANEOUS) ×3 IMPLANT
DECANTER SPIKE VIAL GLASS SM (MISCELLANEOUS) IMPLANT
DERMABOND ADVANCED (GAUZE/BANDAGES/DRESSINGS) ×1
DERMABOND ADVANCED .7 DNX12 (GAUZE/BANDAGES/DRESSINGS) ×2 IMPLANT
DRAIN SNY 10X20 3/4 PERF (WOUND CARE) IMPLANT
DRAPE WARM FLUID 44X44 (DRAPE) ×3 IMPLANT
DRAPE X-RAY CASS 24X20 (DRAPES) IMPLANT
DRSG COVADERM 4X8 (GAUZE/BANDAGES/DRESSINGS) ×3 IMPLANT
ELECT REM PT RETURN 9FT ADLT (ELECTROSURGICAL) ×3
ELECTRODE REM PT RTRN 9FT ADLT (ELECTROSURGICAL) ×2 IMPLANT
EVACUATOR SILICONE 100CC (DRAIN) IMPLANT
GLOVE BIO SURGEON STRL SZ 6.5 (GLOVE) ×6 IMPLANT
GLOVE BIOGEL PI IND STRL 6.5 (GLOVE) ×4 IMPLANT
GLOVE BIOGEL PI IND STRL 7.5 (GLOVE) ×2 IMPLANT
GLOVE BIOGEL PI INDICATOR 6.5 (GLOVE) ×2
GLOVE BIOGEL PI INDICATOR 7.5 (GLOVE) ×1
GLOVE SS BIOGEL STRL SZ 7 (GLOVE) ×2 IMPLANT
GLOVE SUPERSENSE BIOGEL SZ 7 (GLOVE) ×1
GLOVE SURG SS PI 7.0 STRL IVOR (GLOVE) ×3 IMPLANT
GOWN STRL NON-REIN LRG LVL3 (GOWN DISPOSABLE) ×12 IMPLANT
GOWN STRL REIN XL XLG (GOWN DISPOSABLE) ×3 IMPLANT
INSERT FOGARTY SM (MISCELLANEOUS) ×3 IMPLANT
KIT BASIN OR (CUSTOM PROCEDURE TRAY) ×3 IMPLANT
KIT ROOM TURNOVER OR (KITS) ×3 IMPLANT
NS IRRIG 1000ML POUR BTL (IV SOLUTION) ×6 IMPLANT
PACK PERIPHERAL VASCULAR (CUSTOM PROCEDURE TRAY) ×3 IMPLANT
PAD ARMBOARD 7.5X6 YLW CONV (MISCELLANEOUS) ×6 IMPLANT
PADDING CAST COTTON 6X4 STRL (CAST SUPPLIES) IMPLANT
SET COLLECT BLD 21X3/4 12 (NEEDLE) IMPLANT
STOPCOCK 4 WAY LG BORE MALE ST (IV SETS) IMPLANT
SUT PROLENE 6 0 BV (SUTURE) ×3 IMPLANT
SUT PROLENE 6 0 C 1 24 (SUTURE) ×6 IMPLANT
SUT PROLENE 6 0 CC (SUTURE) ×6 IMPLANT
SUT PROLENE 7 0 BV 1 (SUTURE) ×9 IMPLANT
SUT PROLENE 7 0 BV1 MDA (SUTURE) IMPLANT
SUT SILK 2 0 SH (SUTURE) ×3 IMPLANT
SUT SILK 3 0 (SUTURE)
SUT SILK 3-0 18XBRD TIE 12 (SUTURE) IMPLANT
SUT SILK 4 0 (SUTURE) ×1
SUT SILK 4-0 18XBRD TIE 12 (SUTURE) ×2 IMPLANT
SUT VIC AB 2-0 CTX 36 (SUTURE) ×6 IMPLANT
SUT VIC AB 3-0 SH 27 (SUTURE) ×5
SUT VIC AB 3-0 SH 27X BRD (SUTURE) ×10 IMPLANT
SUT VICRYL 4-0 PS2 18IN ABS (SUTURE) ×9 IMPLANT
TOWEL OR 17X24 6PK STRL BLUE (TOWEL DISPOSABLE) ×6 IMPLANT
TOWEL OR 17X26 10 PK STRL BLUE (TOWEL DISPOSABLE) ×6 IMPLANT
TRAY FOLEY CATH 14FRSI W/METER (CATHETERS) ×3 IMPLANT
TUBING EXTENTION W/L.L. (IV SETS) IMPLANT
UNDERPAD 30X30 INCONTINENT (UNDERPADS AND DIAPERS) ×3 IMPLANT
WATER STERILE IRR 1000ML POUR (IV SOLUTION) ×3 IMPLANT

## 2013-02-07 NOTE — Anesthesia Procedure Notes (Addendum)
Procedure Name: Intubation Date/Time: 02/07/2013 7:40 AM Performed by: Margaree Mackintosh Pre-anesthesia Checklist: Patient identified, Timeout performed, Emergency Drugs available, Suction available and Patient being monitored Patient Re-evaluated:Patient Re-evaluated prior to inductionOxygen Delivery Method: Circle system utilized Preoxygenation: Pre-oxygenation with 100% oxygen Intubation Type: IV induction Ventilation: Mask ventilation without difficulty Laryngoscope Size: Mac and 3 Grade View: Grade II Tube type: Oral Tube size: 7.5 mm Number of attempts: 1 Airway Equipment and Method: Stylet and LTA kit utilized Placement Confirmation: ETT inserted through vocal cords under direct vision,  positive ETCO2 and breath sounds checked- equal and bilateral Secured at: 23 cm Tube secured with: Tape Dental Injury: Teeth and Oropharynx as per pre-operative assessment

## 2013-02-07 NOTE — Preoperative (Signed)
Beta Blockers   Reason not to administer Beta Blockers:Not Applicable 

## 2013-02-07 NOTE — Transfer of Care (Signed)
Immediate Anesthesia Transfer of Care Note  Patient: Steven [name]  Procedure(s) Performed: Procedure(s) with comments: BYPASS GRAFT FEMORAL-POPLITEAL ARTERY- RIGHT (Right) - Right femoral to below knee popliteal bypass using right non reversed great saphenous vein Attempted INTRA OPERATIVE ARTERIOGRAM (Right)  Patient Location: PACU  Anesthesia Type:General  Level of Consciousness: awake, alert  and oriented  Airway & Oxygen Therapy: Patient Spontanous Breathing and Patient connected to nasal cannula oxygen  Post-op Assessment: Report given to PACU RN and Post -op Vital signs reviewed and stable  Post vital signs: Reviewed and stable  Complications: No apparent anesthesia complications

## 2013-02-07 NOTE — Anesthesia Preprocedure Evaluation (Signed)
Anesthesia Evaluation  Patient identified by MRN, date of birth, ID band Patient awake    Reviewed: Allergy & Precautions, H&P , NPO status , Patient's Chart, lab work & pertinent test results  Airway Mallampati: I TM Distance: >3 FB Neck ROM: full    Dental   Pulmonary COPD         Cardiovascular hypertension, + CAD and + Peripheral Vascular Disease Rhythm:regular Rate:Normal     Neuro/Psych    GI/Hepatic GERD-  ,  Endo/Other  diabetes  Renal/GU Renal disease     Musculoskeletal   Abdominal   Peds  Hematology   Anesthesia Other Findings   Reproductive/Obstetrics                           Anesthesia Physical Anesthesia Plan  ASA: III  Anesthesia Plan: General   Post-op Pain Management:    Induction: Intravenous  Airway Management Planned: Oral ETT  Additional Equipment:   Intra-op Plan:   Post-operative Plan: Extubation in OR  Informed Consent: I have reviewed the patients History and Physical, chart, labs and discussed the procedure including the risks, benefits and alternatives for the proposed anesthesia with the patient or authorized representative who has indicated his/her understanding and acceptance.     Plan Discussed with: CRNA, Anesthesiologist and Surgeon  Anesthesia Plan Comments:         Anesthesia Quick Evaluation

## 2013-02-07 NOTE — Anesthesia Postprocedure Evaluation (Signed)
  Anesthesia Post-op Note  Patient: Steven [name]  Procedure(s) Performed: Procedure(s) with comments: BYPASS GRAFT FEMORAL-POPLITEAL ARTERY- RIGHT (Right) - Right femoral to below knee popliteal bypass using right non reversed great saphenous vein Attempted INTRA OPERATIVE ARTERIOGRAM (Right)  Patient Location: PACU  Anesthesia Type:General  Level of Consciousness: awake, alert , oriented and patient cooperative  Airway and Oxygen Therapy: Patient Spontanous Breathing  Post-op Pain: mild  Post-op Assessment: Post-op Vital signs reviewed, Patient's Cardiovascular Status Stable, Respiratory Function Stable, Patent Airway, No signs of Nausea or vomiting and Pain level controlled  Post-op Vital Signs: stable  Complications: No apparent anesthesia complications

## 2013-02-07 NOTE — Progress Notes (Signed)
PHARMACIST - PHYSICIAN COMMUNICATION MD Care Team:   CONCERNING:  METFORMIN SAFE ADMINISTRATION POLICY  RECOMMENDATION: Metformin has been placed on DISCONTINUE (rejected order) STATUS and should be reordered only after any of the conditions below are ruled out.  DESCRIPTION:  The Pharmacy Committee has adopted a policy that restricts the use of metformin in hospitalized patients until all the contraindications to administration have been ruled out. Specific contraindications are: [x]  Serum creatinine ? 1.5 for males []  Serum creatinine ? 1.4 for females []  Shock, acute MI, sepsis, hypoxemia, dehydration []  Planned administration of intravenous iodinated contrast media []  Heart Failure patients with low EF []  Acute or chronic metabolic acidosis (including DKA)  BMET has been ordered for AM, please evaluate and resume Metformin if it is < 1.5.      Nadara Mustard, PharmD., MS Clinical Pharmacist Pager:  [phone] Thank you for allowing pharmacy to be part of this patients care team.

## 2013-02-07 NOTE — Progress Notes (Signed)
DP pulse in right foot moves around able to palpate posterior pulse but able to dopple both pulses

## 2013-02-07 NOTE — H&P (View-Only) (Signed)
Subjective:     Patient ID: Steven [name], male   DOB: 28-Sep-1945, 67 y.o.   MRN: [id number]  HPI this 67 year old male returns to discuss his angiographic findings. He recently had an arteriogram performed by Dr. Myra Gianotti for severe bilateral calf claudication. Patient is unable to walk more than 30 or 40 feet without pain in both calves. He has no history of rest pain or nonhealing ulcers or infection. He had angiography performed a few weeks ago and he was not a good candidate for PTA and stenting because of flush occlusion of both superficial femoral arteries. He does have reconstitution of both popliteal arteries well above the knee.  Past Medical History  Diagnosis Date  . Coronary artery disease   . Diabetes mellitus without complication   . Dyslipidemia   . Hypertension   . Peripheral vascular disease   . Hernia   . Chest pain   . Heart murmur   . Allergy   . Hyperlipidemia   . Sickle cell anemia     has trait  . Irregular heartbeat   . GERD (gastroesophageal reflux disease)     History  Substance Use Topics  . Smoking status: Former Smoker    Types: Cigarettes  . Smokeless tobacco: Never Used     Comment: Quit tobacco 2 years ago.    . Alcohol Use: 1.8 oz/week    3 Cans of beer per week    Family History  Problem Relation Age of Onset  . Diabetes Mother   . Hypertension Mother   . Alcohol abuse Neg Hx   . Cancer Neg Hx   . Depression Neg Hx   . Early death Neg Hx   . Heart disease Neg Hx   . Hyperlipidemia Neg Hx   . Kidney disease Neg Hx   . Stroke Neg Hx   . Colon cancer Neg Hx   . Esophageal cancer Neg Hx   . Rectal cancer Neg Hx   . Stomach cancer Neg Hx     No Known Allergies  Current outpatient prescriptions:aspirin EC 81 MG tablet, Take 81 mg by mouth daily., Disp: , Rfl: ;  atorvastatin (LIPITOR) 20 MG tablet, Take 20 mg by mouth daily., Disp: , Rfl: ;  cetirizine (ZYRTEC) 10 MG tablet, Take 10 mg by mouth daily., Disp: , Rfl: ;  glucose blood  (ACCU-CHEK COMFORT CURVE) test strip, Test up to twice daily Dx:250.42, Disp: 300 each, Rfl: 4 hydrochlorothiazide (MICROZIDE) 12.5 MG capsule, Take 2 capsules (25 mg total) by mouth daily., Disp: 180 capsule, Rfl: 3;  Hydrocod Polst-Chlorphen Polst 10-8 MG CP12, Take 1 tablet by mouth 2 (two) times daily as needed., Disp: 25 each, Rfl: 1;  hydrocortisone (ANUSOL-HC) 2.5 % rectal cream, Place 1 application rectally 2 (two) times daily as needed. For hemorrhoids, Disp: , Rfl:  hydrocortisone 2.5 % cream, Apply topically 2 (two) times daily., Disp: 30 g, Rfl: 11;  insulin detemir (LEVEMIR FLEXPEN) 100 UNIT/ML injection, Inject 0.3 mLs (30 Units total) into the skin at bedtime., Disp: 9 mL, Rfl: 12;  Insulin Pen Needle 32G X 4 MM MISC, Use as directed with insulin, Disp: 30 each, Rfl: 0;  Lancets (ACCU-CHEK SOFT TOUCH) lancets, Test up to BID. Dx:250.42, Disp: 100 each, Rfl: 4 losartan (COZAAR) 100 MG tablet, Take 100 mg by mouth daily., Disp: , Rfl: ;  metFORMIN (GLUCOPHAGE) 1000 MG tablet, Take 1,000 mg by mouth 2 (two) times daily with a meal., Disp: , Rfl: ;  mometasone (NASONEX)  50 MCG/ACT nasal spray, Place 2 sprays into the nose daily., Disp: , Rfl: ;  Tamsulosin HCl (FLOMAX) 0.4 MG CAPS, Take 0.4 mg by mouth daily after supper., Disp: , Rfl:   BP 140/75  Pulse 70  Resp 16  Ht 5\' 5"  (1.651 m)  Wt 145 lb (65.772 kg)  BMI 24.13 kg/m2  SpO2 97%  Body mass index is 24.13 kg/(m^2).           Review of Systems denies chest pain, dyspnea on exertion, PND, orthopnea, hemoptysis, lateralizing weakness, aphasia. Does have leg discomfort with walking and some rash on the palms of both hands recently. All other systems negative a complete review of systems    Objective:   Physical Exam blood pressure 140/75 heart rate 70 respirations 16   Gen.-alert and oriented x3 in no apparent distress HEENT normal for age Lungs no rhonchi or wheezing Cardiovascular regular rhythm no murmurs carotid pulses  3+ palpable no bruits audible Abdomen soft nontender no palpable masses Musculoskeletal free of  major deformities Skin clear -no rashes Neurologic normal Lower extremities 3+ femoral pulses bilaterally-absent popliteal and distal pulses Both feet adequately perfused  Backordered bilateral vein mapping which are reviewed and interpreted. Saphenous vein is borderline bilaterally with a minimum size of 0.25 cm on the right and 0.31 cm on the left.  Assessment:     #1 bilateral superficial femoral occlusion with reconstitution of the above-knee popliteal artery and two-vessel runoff #2 history of coronary artery disease previous drug-eluting stent in 2012-followed by Dr. Antoine Poche Discuss situation with Dr. Antoine Poche and he will evaluate the patient preoperatively for cardiac clearance    Plan:     Plan right femoral-popliteal bypass graft with either saphenous vein or Gore-Tex on Thursday, July 17 pending cardiac clearance

## 2013-02-07 NOTE — Telephone Encounter (Addendum)
Message copied by Rosalyn Charters on Fri Feb 07, 2013 11:30 AM ------      Message from: Sharee Pimple      Created: Fri Feb 07, 2013 11:16 AM      Regarding: schedule                   ----- Message -----         From: Dara Lords, PA-C         Sent: 02/07/2013  10:58 AM           To: Sharee Pimple, CMA            S/p right fem pop bypass with saphenous vein graft on 02/07/13.  F/u with dr. Hart Rochester in 2 weeks.            Thanks,      Lelon Mast ------  unable to reach patient by phone mailed appt. letter notifiying pt of fu appt. on 03-04-13 at 1:45

## 2013-02-07 NOTE — Progress Notes (Signed)
DP pulse on right foot marked but moves around on foot and posterior tibial pulse palpated feels sensation moves toes foot warm with good capillary refill pain 4/10

## 2013-02-07 NOTE — Progress Notes (Signed)
Pt admitted from PACU, oriented to unit and routines, pain management and safety discussed, pt verbalizes understanding - VSS - Pain med given

## 2013-02-07 NOTE — Op Note (Signed)
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 The right leg was prepped with Betadine scrub and solution draped in routine sterile manner. Saphenous vein was exposed the saphenofemoral junction and removed through multiple small incisions on the medial aspect of the right leg down to the midcalf. Branches were ligated with 4-0 silk ties and divided it was removed gently dilated with saline marked for orientation purposes. There was an excellent vein being about 3 mm in size throughout. The common superficial and profunda femoris arteries were dissected free and the inguinal incision. There is diffuse disease in the common femoral artery extending up into the external iliac artery but there was a good pulse anteriorly. The popliteal artery was then exposed in the below-knee position. Was also a diffusely diseased vessel but widely patent on angiogram with runoff primarily through the posterior tibial artery. Subfascial anatomic tunnel was created and the patient was heparinized. Femoral vessels were occluded with vascular clamps proximal control of the external iliac obtained as well. Longitudinal opening made in the common femoral extended with Potts scissors up to the inguinal ligament level. The vessel became less thickened at that point there was excellent inflow. Vein was carefully measured spatulated and anastomosed end to side  with 6-0 Prolene. He was used in a nonreversible fashion. Following completion of this the valves were rendered incompetent using a retrograde valvulotome. There was excellent flow from the distal end of the vein graft. The vein was carefully delivered through the tunnel being careful to preserve its orientation. Popliteal artery was occluded proximally and distally opened with 15 blade extended with Potts scissors. It was diffusely diseased it was widely patent with about a 3-1/2 mm lumen. Vein was carefully measured spatulated and anastomosed end-to-side with 6-0 Prolene. This was then released there was Pulse in the vein graft and a palpable posterior tibial pulse at the ankle. Attempt was made to June intraoperative arteriogram injecting 25 cc but the machine malfunctioned. I did not repeat this because the patient's creatinine of 1.8 and the fact that there was a palpable posterior tibial pulse. Protamine was given to reverse the heparin following adequate hemostasis the wounds were closed in layers with Vicryl in a subcuticular fashion with Dermabond patient taken to the recovery room in stable condition  Complications: None  Procedure Details:   Josephina Gip, MD 02/07/2013 10:59 AM

## 2013-02-07 NOTE — Interval H&P Note (Signed)
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 After consideration of risks, benefits and other options for treatment, the patient has consented to  Procedure(s): BYPASS GRAFT FEMORAL-POPLITEAL ARTERY- RIGHT (Right) as a surgical intervention .  The patient's history has been reviewed, patient examined, no change in status, stable for surgery.  I have reviewed the patient's chart and labs.  Questions were answered to the patient's satisfaction.     Josephina Gip

## 2013-02-08 LAB — TYPE AND SCREEN

## 2013-02-08 LAB — BASIC METABOLIC PANEL
BUN: 16 mg/dL (ref 6–23)
CO2: 24 mEq/L (ref 19–32)
Chloride: 100 mEq/L (ref 96–112)
Glucose, Bld: 180 mg/dL — ABNORMAL HIGH (ref 70–99)
Potassium: 3.5 mEq/L (ref 3.5–5.1)

## 2013-02-08 LAB — CBC
HCT: 28.5 % — ABNORMAL LOW (ref 39.0–52.0)
Hemoglobin: 10.2 g/dL — ABNORMAL LOW (ref 13.0–17.0)
MCHC: 35.8 g/dL (ref 30.0–36.0)
RBC: 3.41 MIL/uL — ABNORMAL LOW (ref 4.22–5.81)
WBC: 6 10*3/uL (ref 4.0–10.5)

## 2013-02-08 LAB — GLUCOSE, CAPILLARY
Glucose-Capillary: 145 mg/dL — ABNORMAL HIGH (ref 70–99)
Glucose-Capillary: 159 mg/dL — ABNORMAL HIGH (ref 70–99)
Glucose-Capillary: 150 mg/dL — ABNORMAL HIGH (ref 70–99)

## 2013-02-08 NOTE — Progress Notes (Signed)
VASCULAR SURGERY PROGRESS NOTE  SUBJECTIVE: No complaints.  PHYSICAL EXAM: Filed Vitals:   02/08/13 0347 02/08/13 0400 02/08/13 0638 02/08/13 0730  BP: 124/53     Pulse: 83  89   Temp: 99.5 F (37.5 C) 99.5 F (37.5 C)  102.5 F (39.2 C)  TempSrc: Oral Oral  Oral  Resp: 18  14   Height:      Weight:      SpO2: 94%  94%    Incisions look fine. Palpable left popliteal and left PT pulse No significant left leg swelling.   LABS: Lab Results  Component Value Date   WBC 8.9 02/07/2013   HGB 11.3* 02/07/2013   HCT 32.0* 02/07/2013   MCV 84.4 02/07/2013   PLT 142* 02/07/2013   Lab Results  Component Value Date   CREATININE 1.46* 02/07/2013   Lab Results  Component Value Date   INR 1.07 02/03/2013   CBG (last 3)   Recent Labs  02/07/13 1623 02/07/13 2144 02/08/13 0728  GLUCAP 139* 145* 83    ASSESSMENT AND PLAN:  * 1 Day Post-Op s/p: Left fem pop bypass with vein. Graft patent.   * Diabetes Management: CBG's under good control.  * GU: Foley out this AM  * DVT prophylaxis: Lovenox  * Statin: Lipitor (Home Med)  * Low grade fever: Likely ATx. Encourage IS.   * Transfer to 2000 today.   * Disposition: Anticipated D/C to home tomorrow.    Steven Ferrari, MD, FACS Beeper [phone] 02/08/2013

## 2013-02-08 NOTE — Progress Notes (Signed)
Assisted pt to stand to void unable to void, bladder scan reads 495, Pt in & out cath per policy, 600cc yellow urine returned. Will continue to monitor closely Georgette Dover

## 2013-02-08 NOTE — Progress Notes (Addendum)
[facility] Progress Note  02/08/2013 8:16 AM 1 Day Post-Op  Subjective:  "a little sore this am"  Tm 102.5 this am. HR 50's-90 110's-140's systolic 94% RA  Filed Vitals:   02/08/13 0730  BP:   Pulse:   Temp: 102.5 F (39.2 C)  Resp:     Physical Exam: Incisions:  All incisions are c/d/i.  Right groin incision with bandage that is c/d/i. Extremities:  Right foot is warm and well perfused.  + palpable popliteal and Pt on the right.  CBC    Component Value Date/Time   WBC 8.9 02/07/2013 1627   RBC 3.79* 02/07/2013 1627   HGB 11.3* 02/07/2013 1627   HCT 32.0* 02/07/2013 1627   PLT 142* 02/07/2013 1627   MCV 84.4 02/07/2013 1627   MCH 29.8 02/07/2013 1627   MCHC 35.3 02/07/2013 1627   RDW 15.4 02/07/2013 1627   LYMPHSABS 1.8 08/08/2012 1015   MONOABS 0.5 08/08/2012 1015   EOSABS 0.3 08/08/2012 1015   BASOSABS 0.0 08/08/2012 1015    BMET    Component Value Date/Time   NA 135 02/03/2013 1630   K 3.9 02/03/2013 1630   CL 95* 02/03/2013 1630   CO2 26 02/03/2013 1630   GLUCOSE 203* 02/03/2013 1630   BUN 40* 02/03/2013 1630   CREATININE 1.46* 02/07/2013 1627   CALCIUM 10.2 02/03/2013 1630   GFRNONAA 49* 02/07/2013 1627   GFRAA 57* 02/07/2013 1627    INR    Component Value Date/Time   INR 1.07 02/03/2013 1630     Intake/Output Summary (Last 24 hours) at 02/08/13 0816 Last data filed at 02/08/13 0700  Gross per 24 hour  Intake   3485 ml  Output   2755 ml  Net    730 ml     Assessment/Plan:  67 y.o. male is s/p:  BYPASS GRAFT FEMORAL-POPLITEAL ARTERY- RIGHT using nonreversible the translocated saphenous vein graft right leg  Attempted INTRA OPERATIVE ARTERIOGRAM   1 Day Post-Op  -pt doing well this am-graft is patent.  Pt has already ambulated with PT this am. -fever this am-pt denies productive cough, all wounds look good, foley out this am.  Probably atelectasis-encourage pt and IS 10x/hr while awake. -DVT prophylaxis:  Lovenox to start today -acute  surgical blood loss anemia-pt tolerating -transfer to 2000 -pt may be able to discharge home tomorrow if he continues to do well. -pt foley out this am-he is on Flomax at home and this is restarted. -preoperative renal dysfunction-labs not back this am-will check tomorrow morning.  Good UOP   Doreatha Massed, PA-C [facility] [phone] 02/08/2013 8:16 AM

## 2013-02-08 NOTE — Evaluation (Signed)
Physical Therapy Evaluation Patient Details Name: Steven [name] MRN: [id number] DOB: 1945/08/16 Today's Date: 02/08/2013 Time: 4098-1191 PT Time Calculation (min): 34 min  PT Assessment / Plan / Recommendation History of Present Illness  67 y/o male s/p Right femoral to below knee popliteal bypass  Clinical Impression  Presents to PT today POD #1 with limited mobility and functional independence secondary to below listed impairments (see PT problem list). Will benefit physical therapy in the acute setting to maximize strength and independence with ambulation and transfers so as to facilitate safe d/c home with support from wife.     PT Assessment  Patient needs continued PT services    Follow Up Recommendations  Home health PT;Supervision for mobility/OOB    Does the patient have the potential to tolerate intense rehabilitation      Barriers to Discharge        Equipment Recommendations  Rolling walker with 5" wheels    Recommendations for Other Services     Frequency Min 3X/week    Precautions / Restrictions Precautions Precautions: Fall Restrictions Weight Bearing Restrictions: No   Pertinent Vitals/Pain 5/10 pain initially, utilized RW to [employer] RLE during ambulation, RN made aware and provided pain meds; instructed pt in ankle pumps to improve ROM and circulation; aware to ambulate with nsg at least 2 more times today      Mobility  Bed Mobility Bed Mobility: Not assessed Transfers Transfers: Sit to Stand;Stand to Sit Sit to Stand: 4: Min assist;With armrests;From chair/3-in-1 Stand to Sit: 4: Min assist;With armrests;To chair/3-in-1 Details for Transfer Assistance: sit<>stand x2, cues for safe hand placement, stability assist during transfers and to control descent to chair, needs upper extremity assist to unweight RLE 2/2 pain Ambulation/Gait Ambulation/Gait Assistance: 4: Min assist Ambulation Distance (Feet): 60 Feet Assistive device: Rolling  walker Ambulation/Gait Assistance Details: cues for tall posture and sequencing with RW, attempted ambulation without RW however pt with too much pain attempting full WB through RLE so needed upper extremity support to unweight Gait Pattern: Step-to pattern;Decreased hip/knee flexion - right;Decreased dorsiflexion - right;Antalgic;Trunk flexed Gait velocity: slow and antalgic, did seem to increase some as he felt more comfortable Stairs: No    Exercises General Exercises - Lower Extremity Ankle Circles/Pumps: AROM;Both;10 reps;Seated   PT Diagnosis: Difficulty walking;Generalized weakness;Acute pain  PT Problem List: Decreased range of motion;Decreased activity tolerance;Decreased balance;Decreased mobility;Pain;Decreased knowledge of use of DME PT Treatment Interventions: DME instruction;Gait training;Stair training;Functional mobility training;Therapeutic activities;Therapeutic exercise;Balance training;Neuromuscular re-education;Patient/family education     PT Goals(Current goals can be found in the care plan section) Acute Rehab PT Goals Patient Stated Goal: to travel again with his wife PT Goal Formulation: With patient Time For Goal Achievement: 02/15/13 Potential to Achieve Goals: Good  Visit Information  Last PT Received On: 02/08/13 Assistance Needed: +1 History of Present Illness: 67 y/o male s/p Right femoral to below knee popliteal bypass       Prior Functioning  Home Living Family/patient expects to be discharged to:: Private residence Living Arrangements: Spouse/significant other Available Help at Discharge: Family;Available 24 hours/day Type of Home: House Home Access: Stairs to enter [employer] of Steps: 3-4 Entrance Stairs-Rails: None Home Layout: One level Home Equipment: None Prior Function Level of Independence: Independent Communication Communication: No difficulties    Cognition  Cognition Arousal/Alertness: Awake/alert Behavior During  Therapy: WFL for tasks assessed/performed Overall Cognitive Status: Within Functional Limits for tasks assessed    Extremity/Trunk Assessment Upper Extremity Assessment Upper Extremity Assessment: Defer to OT evaluation  Lower Extremity Assessment Lower Extremity Assessment: RLE deficits/detail RLE Deficits / Details: limited knee and ankle ROM due to swelling and pain, functional strength for ambulation RLE: Unable to fully assess due to pain Cervical / Trunk Assessment Cervical / Trunk Assessment: Normal   Balance Balance Balance Assessed: Yes Static Standing Balance Static Standing - Balance Support: Bilateral upper extremity supported Static Standing - Level of Assistance: 5: Stand by assistance  End of Session PT - End of Session Equipment Utilized During Treatment: Gait belt Activity Tolerance: Patient tolerated treatment well Patient left: in chair;with call bell/phone within reach Nurse Communication: Mobility status  GP     [facility] HELEN 02/08/2013, 9:03 AM

## 2013-02-09 LAB — GLUCOSE, CAPILLARY
Glucose-Capillary: 93 mg/dL (ref 70–99)
Glucose-Capillary: 141 mg/dL — ABNORMAL HIGH (ref 70–99)
Glucose-Capillary: 239 mg/dL — ABNORMAL HIGH (ref 70–99)
Glucose-Capillary: 177 mg/dL — ABNORMAL HIGH (ref 70–99)

## 2013-02-09 LAB — CBC
MCH: 30.1 pg (ref 26.0–34.0)
Platelets: 131 10*3/uL — ABNORMAL LOW (ref 150–400)
RBC: 3.19 MIL/uL — ABNORMAL LOW (ref 4.22–5.81)
RDW: 15 % (ref 11.5–15.5)
WBC: 6.6 10*3/uL (ref 4.0–10.5)

## 2013-02-09 LAB — BASIC METABOLIC PANEL
CO2: 25 mEq/L (ref 19–32)
Calcium: 9.1 mg/dL (ref 8.4–10.5)
Chloride: 99 mEq/L (ref 96–112)
GFR calc Af Amer: 54 mL/min — ABNORMAL LOW (ref >90)
Sodium: 134 mEq/L — ABNORMAL LOW (ref 135–145)

## 2013-02-09 LAB — URINALYSIS, ROUTINE W REFLEX MICROSCOPIC
Bilirubin Urine: NEGATIVE
Hgb urine dipstick: NEGATIVE
Nitrite: NEGATIVE
Specific Gravity, Urine: 1.013 (ref 1.005–1.030)
pH: 5 (ref 5.0–8.0)

## 2013-02-09 MED ORDER — ENOXAPARIN SODIUM 30 MG/0.3ML ~~LOC~~ SOLN
30.0000 mg | Freq: Every day | SUBCUTANEOUS | Status: DC
  Filled 2013-02-09: qty 0.3

## 2013-02-09 NOTE — Progress Notes (Addendum)
[facility] Progress Note  02/09/2013 7:57 AM 2 Days Post-Op  Subjective:  No complaints  Tm 100.3 this am down from 102.5 yesterday HR 80's-110's 110's-130's systolic 94% RA  Filed Vitals:   02/09/13 0353  BP: 116/70  Pulse: 111  Temp: 100.3 F (37.9 C)  Resp: 18   Physical Exam: Incisions:  Right groin with some bloody drainage; other incisions are c/d/i Extremities:  Right foot is warm and well perfused.  CBC    Component Value Date/Time   WBC 6.6 02/09/2013 0425   RBC 3.19* 02/09/2013 0425   HGB 9.6* 02/09/2013 0425   HCT 26.4* 02/09/2013 0425   PLT 131* 02/09/2013 0425   MCV 82.8 02/09/2013 0425   MCH 30.1 02/09/2013 0425   MCHC 36.4* 02/09/2013 0425   RDW 15.0 02/09/2013 0425   LYMPHSABS 1.8 08/08/2012 1015   MONOABS 0.5 08/08/2012 1015   EOSABS 0.3 08/08/2012 1015   BASOSABS 0.0 08/08/2012 1015   BMET    Component Value Date/Time   NA 134* 02/09/2013 0425   K 3.4* 02/09/2013 0425   CL 99 02/09/2013 0425   CO2 25 02/09/2013 0425   GLUCOSE 130* 02/09/2013 0425   BUN 16 02/09/2013 0425   CREATININE 1.52* 02/09/2013 0425   CALCIUM 9.1 02/09/2013 0425   GFRNONAA 47* 02/09/2013 0425   GFRAA 54* 02/09/2013 0425   INR    Component Value Date/Time   INR 1.07 02/03/2013 1630    Intake/Output Summary (Last 24 hours) at 02/09/13 0757 Last data filed at 02/09/13 0700  Gross per 24 hour  Intake    480 ml  Output   1525 ml  Net  -1045 ml     Assessment/Plan:  67 y.o. male is s/p:  BYPASS GRAFT FEMORAL-POPLITEAL ARTERY- RIGHT using nonreversible the translocated saphenous vein graft right leg  Attempted INTRA OPERATIVE ARTERIOGRAM   2 Days Post-Op  -pt doing well this am-there is some bloody drainage from right groin -ABI's are ordered but not performed yet. -still with fever-WBC is normal.  Continue IS 10x/hr while awake and will check U/A -continue to mobilize today -DVT prophylaxis:  Lovenox-will hold today since there is some bloody drainage from  groin -case management for home health PT and RW   Doreatha Massed, PA-C [facility] [phone] 02/09/2013 7:57 AM  Agree with above. Blood from left groin is just a skin edge with a very small amt of oozing. Needs another day to get mobile Low grade fever likely secondary to ATx. Anticipate D/C tomorrow.  Waverly Ferrari, MD, FACS Beeper [phone] 02/09/2013

## 2013-02-09 NOTE — Progress Notes (Signed)
Physical Therapy Treatment Patient Details Name: Steven [name] MRN: [id number] DOB: 1945/09/23 Today's Date: 02/09/2013 Time: 8295-6213 PT Time Calculation (min): 23 min  PT Assessment / Plan / Recommendation  PT Comments   Pt making steady progress.  Follow Up Recommendations  Home health PT;Supervision for mobility/OOB     Does the patient have the potential to tolerate intense rehabilitation     Barriers to Discharge        Equipment Recommendations  Rolling walker with 5" wheels    Recommendations for Other Services    Frequency Min 3X/week   Progress towards PT Goals Progress towards PT goals: Progressing toward goals  Plan Current plan remains appropriate    Precautions / Restrictions Precautions Precautions: Fall   Pertinent Vitals/Pain Pain 7/10 in rt leg with activity and no pain at rest.    Mobility  Transfers Sit to Stand: 4: Min guard;With upper extremity assist;With armrests;From chair/3-in-1 Stand to Sit: 4: Min guard;With upper extremity assist;With armrests;To chair/3-in-1 Details for Transfer Assistance: Incr time.  Verbal cues for hand placement when returning to sitting. Ambulation/Gait Ambulation/Gait Assistance: 4: Min guard Ambulation Distance (Feet): 190 Feet Assistive device: Rolling walker Ambulation/Gait Assistance Details: Verbal cues to stand more erect especially initially.  Verbal cues not to push walker too far out in front. Gait Pattern: Step-to pattern;Decreased stance time - right;Decreased step length - left;Antalgic;Trunk flexed General Gait Details: Pt with more erect posture as distance incr. Stairs: Yes Stairs Assistance: 4: Min assist Stairs Assistance Details (indicate cue type and reason): Hand-held assist on lt. Verbal cues for gait sequence. Stair Management Technique: No rails;Step to pattern;Forwards;Other (comment) (used hand held) Number of Stairs: 1    Exercises General Exercises - Lower Extremity Ankle  Circles/Pumps: AROM;Both;10 reps;Seated   PT Diagnosis:    PT Problem List:   PT Treatment Interventions:     PT Goals (current goals can now be found in the care plan section)    Visit Information  Last PT Received On: 02/09/13 Assistance Needed: +1 History of Present Illness: 67 y/o male s/p Right femoral to below knee popliteal bypass    Subjective Data      Cognition  Cognition Arousal/Alertness: Awake/alert Behavior During Therapy: WFL for tasks assessed/performed Overall Cognitive Status: Within Functional Limits for tasks assessed    Balance  Static Standing Balance Static Standing - Balance Support: Bilateral upper extremity supported Static Standing - Level of Assistance: 5: Stand by assistance  End of Session PT - End of Session Equipment Utilized During Treatment: Gait belt Activity Tolerance: Patient tolerated treatment well Patient left: in chair;with call bell/phone within reach Nurse Communication: Mobility status   GP     Steven [name] 02/09/2013, 2:26 PM  [employer] PT [phone]

## 2013-02-09 NOTE — Progress Notes (Signed)
Ambulated in hallway with rolling walker and accompained by RN, Ambulated 100 ft, tolerated well. Returned to chair with feet elevated. Egbert Garibaldi A

## 2013-02-10 ENCOUNTER — Encounter (HOSPITAL_COMMUNITY): Payer: Self-pay | Admitting: Vascular Surgery

## 2013-02-10 DIAGNOSIS — Z48812 Encounter for surgical aftercare following surgery on the circulatory system: Secondary | ICD-10-CM

## 2013-02-10 LAB — GLUCOSE, CAPILLARY: Glucose-Capillary: 73 mg/dL (ref 70–99)

## 2013-02-10 MED ORDER — ENOXAPARIN SODIUM 30 MG/0.3ML ~~LOC~~ SOLN: 30.0000 mg | Freq: Every day | SUBCUTANEOUS | Status: DC

## 2013-02-10 MED ORDER — CEPHALEXIN 500 MG PO CAPS: 500.0000 mg | ORAL_CAPSULE | Freq: Three times a day (TID) | ORAL | Status: DC

## 2013-02-10 MED ORDER — CEPHALEXIN 500 MG PO CAPS
500.0000 mg | ORAL_CAPSULE | Freq: Three times a day (TID) | ORAL | Status: DC
  Administered 2013-02-10: 500 mg via ORAL
  Filled 2013-02-10 (×3): qty 1

## 2013-02-10 NOTE — Progress Notes (Signed)
Occupational Therapy Evaluation Patient Details Name: Steven [name] MRN: [id number] DOB: 1946-02-24 Today's Date: 02/10/2013 Time: 1240-1300 OT Time Calculation (min): 20 min  OT Assessment / Plan / Recommendation History of present illness 67 y/o male s/p Right femoral to below knee popliteal bypass   Clinical Impression   Completed compensatory strategies for ADL and education on available AE for LB ADL. Pt/wife demonstrated understanding. No further OT needed at this time.    OT Assessment  Patient does not need any further OT services    Follow Up Recommendations  No OT follow up    Barriers to Discharge      Equipment Recommendations       Recommendations for Other Services    Frequency       Precautions / Restrictions Precautions Precautions: Fall Restrictions Weight Bearing Restrictions: No   Pertinent Vitals/Pain no apparent distress     ADL  Lower Body Bathing: Minimal assistance Where Assessed - Lower Body Bathing: Unsupported sit to stand Lower Body Dressing: Moderate assistance Where Assessed - Lower Body Dressing: Unsupported sit to stand Toilet Transfer: Supervision/safety Transfers/Ambulation Related to ADLs: mod I ADL Comments: limited by pain. Educated t onAE for use with LE ADL    OT Diagnosis:    OT Problem List:   OT Treatment Interventions:     OT Goals(Current goals can be found in the care plan section) Acute Rehab OT Goals Patient Stated Goal: to travel again with his wife OT Goal Formulation:  (eval only)  Visit Information  Last OT Received On: 02/10/13 Assistance Needed: +1 History of Present Illness: 67 y/o male s/p Right femoral to below knee popliteal bypass       Prior Functioning     Home Living Family/patient expects to be discharged to:: Private residence Living Arrangements: Spouse/significant other Available Help at Discharge: Family;Available 24 hours/day Type of Home: House Home Access: Stairs to  enter [employer] of Steps: 3-4 Entrance Stairs-Rails: None Home Layout: One level Home Equipment: None Prior Function Level of Independence: Independent Communication Communication: No difficulties         Vision/Perception     Cognition  Cognition Arousal/Alertness: Awake/alert Behavior During Therapy: WFL for tasks assessed/performed Overall Cognitive Status: Within Functional Limits for tasks assessed    Extremity/Trunk Assessment Upper Extremity Assessment Upper Extremity Assessment: Overall WFL for tasks assessed Lower Extremity Assessment RLE Deficits / Details: limited knee and ankle ROM due to swelling and pain, functional strength for ambulation RLE: Unable to fully assess due to pain Cervical / Trunk Assessment Cervical / Trunk Assessment: Normal     Mobility Bed Mobility Bed Mobility: Not assessed Transfers Transfers: Sit to Stand;Stand to Sit Sit to Stand: 6: Modified independent (Device/Increase time) Stand to Sit: 6: Modified independent (Device/Increase time) Details for Transfer Assistance: Incr time.  Verbal cues for hand placement when returning to sitting.     Exercise General Exercises - Lower Extremity Ankle Circles/Pumps: AROM;Both;10 reps;Seated   Balance Balance Balance Assessed: Yes (WFL for ADL) Static Standing Balance Static Standing - Balance Support: Bilateral upper extremity supported Static Standing - Level of Assistance: 5: Stand by assistance   End of Session OT - End of Session Activity Tolerance: Patient tolerated treatment well Patient left: in chair;with call bell/phone within reach;with family/visitor present Nurse Communication: Mobility status  GO     Kynlee Koenigsberg,HILLARY 02/10/2013, 2:41 PM [facility], OTR/L  [phone] 02/10/2013

## 2013-02-10 NOTE — Care Management Note (Signed)
    Page 1 of 2   02/10/2013     3:01:23 PM   CARE MANAGEMENT NOTE 02/10/2013  Patient:  Steven [name], Steven [name]   Account Number:  [id number]  Date Initiated:  02/10/2013  Documentation initiated by:  Steven [name]  Subjective/Objective Assessment:   PT S/P RT FEM POP BYPASS GRAFT ON 02/10/13.  PTA, PT INDEPENDENT, LIVES WITH SPOUSE.     Action/Plan:   PT NEEDS HH FOLLOW UP FOR PHYSICAL THERAPY.   Anticipated DC Date:  02/10/2013   Anticipated DC Plan:  HOME W HOME HEALTH SERVICES      DC Planning Services  CM consult      [facility] Choice  HOME HEALTH   Choice offered to / List presented to:  [name]   DME arranged  Levan Hurst      DME agency  [facility]     HH arranged  HH-2 PT      [facility] agency  [facility]   Status of service:  Completed, signed off Medicare Important Message given?   (If response is "NO", the following Medicare IM given date fields will be blank) Date Medicare IM given:   Date Additional Medicare IM given:    Discharge Disposition:  HOME W HOME HEALTH SERVICES  Per UR Regulation:  Reviewed for med. necessity/level of care/duration of stay  If discussed at Long Length of Stay Meetings, dates discussed:    Comments:  02/10/13 Steven [name] [phone] PT FOR DC HOME TODAY.  NEEDS RW AND HHPT.  SPOKE WITH WIFE VIA PHONE, PER PT REQUEST.  STATES SHE WANTS HH WITH WHOEVER INSURANCE WILL PAY FOR.  SHE HAS NO PREFERENCE. WILL REFER TO [facility] FOR HH NEEDS.

## 2013-02-10 NOTE — Progress Notes (Addendum)
[facility] Progress Note  02/10/2013 7:42 AM 3 Days Post-Op  Subjective:  Pt states he has increased pain at his right groin; pt states he is walking well with a walker.  Tm 100.2 now 99.6 VSS 96% RA  Filed Vitals:   02/10/13 0422  BP: 107/63  Pulse: 91  Temp: 99.6 F (37.6 C)  Resp: 20    Physical Exam: Incisions:  Below knee incision and saphenous vein harvest site incisions are c/d/i; right groin incision with tenderness to palpation and some erythema Extremities:  Right foot is warm and well perfused.  CBC    Component Value Date/Time   WBC 6.6 02/09/2013 0425   RBC 3.19* 02/09/2013 0425   HGB 9.6* 02/09/2013 0425   HCT 26.4* 02/09/2013 0425   PLT 131* 02/09/2013 0425   MCV 82.8 02/09/2013 0425   MCH 30.1 02/09/2013 0425   MCHC 36.4* 02/09/2013 0425   RDW 15.0 02/09/2013 0425   LYMPHSABS 1.8 08/08/2012 1015   MONOABS 0.5 08/08/2012 1015   EOSABS 0.3 08/08/2012 1015   BASOSABS 0.0 08/08/2012 1015    BMET    Component Value Date/Time   NA 134* 02/09/2013 0425   K 3.4* 02/09/2013 0425   CL 99 02/09/2013 0425   CO2 25 02/09/2013 0425   GLUCOSE 130* 02/09/2013 0425   BUN 16 02/09/2013 0425   CREATININE 1.52* 02/09/2013 0425   CALCIUM 9.1 02/09/2013 0425   GFRNONAA 47* 02/09/2013 0425   GFRAA 54* 02/09/2013 0425    INR    Component Value Date/Time   INR 1.07 02/03/2013 1630     Intake/Output Summary (Last 24 hours) at 02/10/13 0742 Last data filed at 02/10/13 0730  Gross per 24 hour  Intake    480 ml  Output   1115 ml  Net   -635 ml     Assessment/Plan:  67 y.o. male is s/p:  BYPASS GRAFT FEMORAL-POPLITEAL ARTERY- RIGHT using nonreversible the translocated saphenous vein graft right leg  Attempted INTRA OPERATIVE ARTERIOGRAM   3 Days Post-Op  -will have Dr. Edilia Bo check right groin -DVT prophylaxis:  lovenox held yesterday due to some bloody drainage at right groin -still with low grade temp-u/a negative.  Continue IS -possibly home later  toda   Doreatha Massed, PA-C [facility] [phone] 02/10/2013 7:42 AM  Agree with above. No swelling right groin. Just some mild erythema Ok for D/C on Keflex.  Waverly Ferrari, MD, FACS Beeper [phone] 02/10/2013

## 2013-02-10 NOTE — Discharge Summary (Signed)
[facility] Discharge Summary  Steven [name] 1946/04/23 67 y.o. 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 male returns to discuss his angiographic findings. He recently had an arteriogram performed by Dr. Myra [name] for severe bilateral calf claudication. Patient is unable to walk more than 30 or 40 feet without pain in both calves. He has no history of rest pain or nonhealing ulcers or infection. He had angiography performed a few weeks ago and he was not a good candidate for PTA and stenting because of flush occlusion of both superficial femoral arteries. He does have reconstitution of both popliteal arteries well above the knee.  Hospital Course:  The patient was admitted to the hospital and taken to the operating room on 02/07/2013 and underwent: BYPASS GRAFT FEMORAL-POPLITEAL ARTERY- RIGHT using nonreversible the translocated saphenous vein graft right leg  Attempted INTRA OPERATIVE ARTERIOGRAM    The pt tolerated the procedure well and was transported to the PACU in good condition. On POD 1, the pt was doing well and graft was patent.  He did have a Tm of 102.5.  He did have a u/a and this was negative.  He was encouraged to do IS 10x/hr while awake.  He has continued to have a normal WBC.    On POD 2, he did have some bleeding from the skin edge at the right groin and this was a very small amount of oozing.  His lovenox was held.    On POD 3, he did have some erythema around the right groin incision.  There is no hematoma present.  He does have tenderness to palpation.  He is started on Keflex and given 10 days to go home on for mild cellulitis.  The remainder of the hospital course consisted of increasing mobilization and increasing intake of solids without difficulty.  CBC    Component Value  Date/Time   WBC 6.6 02/09/2013 0425   RBC 3.19* 02/09/2013 0425   HGB 9.6* 02/09/2013 0425   HCT 26.4* 02/09/2013 0425   PLT 131* 02/09/2013 0425   MCV 82.8 02/09/2013 0425   MCH 30.1 02/09/2013 0425   MCHC 36.4* 02/09/2013 0425   RDW 15.0 02/09/2013 0425   LYMPHSABS 1.8 08/08/2012 1015   MONOABS 0.5 08/08/2012 1015   EOSABS 0.3 08/08/2012 1015   BASOSABS 0.0 08/08/2012 1015    BMET    Component Value Date/Time   NA 134* 02/09/2013 0425   K 3.4* 02/09/2013 0425   CL 99 02/09/2013 0425   CO2 25 02/09/2013 0425   GLUCOSE 130* 02/09/2013 0425   BUN 16 02/09/2013 0425   CREATININE 1.52* 02/09/2013 0425   CALCIUM 9.1 02/09/2013 0425   GFRNONAA 47* 02/09/2013 0425   GFRAA 54* 02/09/2013 0425     Discharge Instructions:   The patient is discharged to home with extensive instructions on wound care and progressive ambulation.  They are instructed not to drive or perform any heavy lifting until returning to see the physician in his office.  Discharge Orders   Future Appointments Provider Department Dept Phone   03/04/2013 1:45 PM Steven Ochoa, MD [facility] [phone]   Future Orders Complete By Expires     Call MD for:  redness, tenderness, or signs of infection (pain, swelling, bleeding, redness, odor or green/yellow discharge around  incision site)  As directed     Call MD for:  severe or increased pain, loss or decreased feeling  in affected limb(s)  As directed     Call MD for:  temperature >100.5  As directed     Discharge wound care:  As directed     Comments:      Wash the groin wound with soap and water daily and pat dry. (No tub bath-only shower)  Then put a dry gauze or washcloth there to keep this area dry daily and as needed.  Do not use Vaseline or neosporin on your incisions.  Only use soap and water on your incisions and then protect and keep dry.    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 Peripheral vascular disease, unspecified 01/21/2013  . Chronic renal insufficiency 12/24/2012  . Seborrheic dermatitis 12/24/2012  . Atherosclerosis of native arteries of the extremities with intermittent claudication 12/03/2012  . COPD (chronic obstructive pulmonary disease) 12/01/2012  . Allergic rhinitis, cause unspecified 11/29/2012  . PVD (peripheral vascular disease) with claudication 10/11/2012  . GERD (gastroesophageal reflux disease) 09/10/2012  . Routine general medical examination at a health care facility 08/08/2012  . Diabetes 1.5, managed as type 1 08/08/2012  . CAD (coronary artery disease) 08/08/2012  . Essential hypertension, benign 08/08/2012   Past Medical History  Diagnosis Date  . Coronary artery disease     Lexiscan Myoview 7/14:  Inf scar with minimal reversibility, inf HK, EF 45%;  Low risk  . Diabetes mellitus without complication   . Dyslipidemia   . Hypertension   . Peripheral vascular disease   . Hernia   . Chest pain   . Heart murmur   . Allergy   . Hyperlipidemia   . Sickle cell anemia     has trait  . Irregular heartbeat   . Shortness of breath   . GERD (gastroesophageal reflux disease)     has used Zantac in the past        Medication List         aspirin EC 81 MG tablet  Take 81 mg by mouth daily.     atorvastatin 20 MG tablet  Commonly known as:  LIPITOR  Take 20 mg by mouth daily before breakfast.     cephALEXin 500 MG capsule  Commonly known as:  KEFLEX  Take 1 capsule (500 mg total) by mouth 3 (three) times daily.     cetirizine 10 MG tablet  Commonly known as:  ZYRTEC  Take 10 mg by mouth daily before breakfast.     hydrochlorothiazide 12.5 MG capsule  Commonly  known as:  MICROZIDE  Take 25 mg by mouth daily before breakfast.     Hydrocod Polst-Chlorphen Polst 10-8 MG Cp12  Take 1 tablet by mouth 2 (two) times daily as needed.     hydrocortisone 2.5 % cream  Apply topically 2 (two) times daily.     hydrocortisone 2.5 % rectal cream  Commonly known as:  ANUSOL-HC  Place 1 application rectally 2 (two)  times daily as needed. For hemorrhoids     insulin detemir 100 UNIT/ML injection  Commonly known as:  LEVEMIR FLEXPEN  Inject 0.3 mLs (30 Units total) into the skin at bedtime.     losartan 100 MG tablet  Commonly known as:  COZAAR  Take 100 mg by mouth daily before breakfast.     metFORMIN 1000 MG tablet  Commonly known as:  GLUCOPHAGE  Take 1,000 mg by mouth 2 (two) times daily with a meal.     mometasone 50 MCG/ACT nasal spray  Commonly known as:  NASONEX  Place 2 sprays into the nose daily.     oxyCODONE 5 MG immediate release tablet  Commonly known as:  ROXICODONE  Take 1 tablet (5 mg total) by mouth every 6 (six) hours as needed for pain.     tamsulosin 0.4 MG Caps  Commonly known as:  FLOMAX  Take 0.4 mg by mouth daily after supper.        Roxicodone #30 No Refill  Disposition: home  Patient's condition: is Good  Follow up: 1. Dr. Hart Rochester in 2 weeks   Doreatha Massed, PA-C [facility] [phone] 02/10/2013  10:29 AM  - For VQI Registry use --- Instructions: Press F2 to tab through selections.  Delete question if not applicable.   Post-op:  Wound infection: Yes - mild cellulitis right groin Graft infection: No  Transfusion: No  If yes, n/a units given New Arrhythmia: No Ipsilateral amputation: No, [ ]  Minor, [ ]  BKA, [ ]  AKA Discharge patency: [x ] Primary, [ ]  Primary assisted, [ ]  Secondary, [ ]  Occluded Patency judged by: [x ] Dopper only, [ ]  Palpable graft pulse, [ ]  Palpable distal pulse, [ ]  ABI inc. > 0.15, [ ]  Duplex Discharge ABI: R 0.65, L 0.51 Discharge TBI: R n/a, L n/a D/C  Ambulatory Status: Ambulatory with Assistance  Complications: MI: No, [ ]  Troponin only, [ ]  EKG or Clinical CHF: No Resp failure:No, [ ]  Pneumonia, [ ]  Ventilator Chg in renal function: No, [ ]  Inc. Cr > 0.5, [ ]  Temp. Dialysis, [ ]  Permanent dialysis Stroke: No, [ ]  Minor, [ ]  Major Return to OR: No  Reason for return to OR: [ ]  Bleeding, [ ]  Infection, [ ]  Thrombosis, [ ]  Revision  Discharge medications: Statin use:  Yes ASA use:  Yes Plavix use:  No  for medical reason not prescribed Beta blocker use: No  for medical reason not on pre operatively Coumadin use: No  for medical reason not indicated

## 2013-02-10 NOTE — Progress Notes (Signed)
VASCULAR LAB PRELIMINARY  ARTERIAL  ABI completed:    RIGHT    LEFT    PRESSURE WAVEFORM  PRESSURE WAVEFORM  BRACHIAL 150 Triphasic BRACHIAL 145 Triphasic  DP 93 Biphasic DP 77 Monophasic  PT 97 Triphasic PT 54 Monophasic    RIGHT LEFT  ABI 0.65 0.51   ABI on the right indicates a moderate reduction in arterial flow with an increase in pressures post operative. Left ABI indicates a moderate to severe reduction.  Keah Lamba, RVS 02/10/2013, 10:27 AM

## 2013-02-10 NOTE — Progress Notes (Signed)
Physical Therapy Treatment Patient Details Name: Steven [name] MRN: [id number] DOB: Jul 24, 1946 Today's Date: 02/10/2013 Time: 7829-5621 PT Time Calculation (min): 24 min  PT Assessment / Plan / Recommendation  PT Comments   Pt demonstrates steady progress towards PT goals at this time. Patient ambulating and transferring without physical assist.  Follow Up Recommendations  Home health PT;Supervision for mobility/OOB           Equipment Recommendations  Rolling walker with 5" wheels       Frequency Min 3X/week   Progress towards PT Goals  Progressing towards goals  Plan Current plan remains appropriate    Precautions / Restrictions Precautions Precautions: Fall Restrictions Weight Bearing Restrictions: No   Pertinent Vitals/Pain 4/10    Mobility  Bed Mobility Bed Mobility: Not assessed Transfers Transfers: Sit to Stand;Stand to Sit Sit to Stand: 5: Supervision Stand to Sit: 5: Supervision Details for Transfer Assistance: Incr time.  Verbal cues for hand placement when returning to sitting. Ambulation/Gait Ambulation/Gait Assistance: 5: Supervision Ambulation Distance (Feet): 380 Feet Assistive device: Rolling walker Ambulation/Gait Assistance Details: VCs for upright posture, increased cadence and step through gait Gait Pattern: Step-through pattern;Decreased step length - left;Decreased stance time - right;Antalgic;Trunk flexed (emerging step through) Gait velocity: Very slow pace, VCs for increased speed General Gait Details: Pt with more erect posture as distance incr. Stairs: Yes Stairs Assistance: 4: Min assist Stairs Assistance Details (indicate cue type and reason): hand held assist Stair Management Technique: No rails;Step to pattern;Forwards;Other (comment) (used hand held) Number of Stairs: 6    Exercises General Exercises - Lower Extremity Ankle Circles/Pumps: AROM;Both;10 reps;Seated   PT Diagnosis:    PT Problem List:   PT Treatment  Interventions:     PT Goals (current goals can now be found in the care plan section) Acute Rehab PT Goals Patient Stated Goal: to travel again with his wife PT Goal Formulation: With patient Time For Goal Achievement: 02/15/13 Potential to Achieve Goals: Good  Visit Information  Last PT Received On: 02/10/13 Assistance Needed: +1 History of Present Illness: 67 y/o male s/p Right femoral to below knee popliteal bypass    Subjective Data  Subjective: I am hoping to go home today Patient Stated Goal: to travel again with his wife   Cognition  Cognition Arousal/Alertness: Awake/alert Behavior During Therapy: WFL for tasks assessed/performed Overall Cognitive Status: Within Functional Limits for tasks assessed    Balance  Balance Balance Assessed: Yes Static Standing Balance Static Standing - Balance Support: Bilateral upper extremity supported Static Standing - Level of Assistance: 5: Stand by assistance  End of Session PT - End of Session Equipment Utilized During Treatment: Gait belt Activity Tolerance: Patient tolerated treatment well Patient left: in chair;with call bell/phone within reach Nurse Communication: Mobility status   GP     [name] 02/10/2013, 11:18 AM Charlotte Crumb, PT DPT  [phone]

## 2013-02-12 ENCOUNTER — Telehealth: Payer: Self-pay | Admitting: Internal Medicine

## 2013-02-12 NOTE — Telephone Encounter (Signed)
He should continue with the instructions given to him at the time of discharge or check his BP and let us know what the number is

## 2013-02-12 NOTE — Telephone Encounter (Signed)
When Home Health came yesterday the blood pressure was 130/58 with a HR of 84.  Home health will come back Friday afternoon.  I made him an appointment to come on Aug 8.  They didn't want sooner.

## 2013-02-12 NOTE — Telephone Encounter (Signed)
Pt just got out of the hospital Monday.  Home health came yesterday.  He is on 2 heart medicines.  Losartan and Metoprolal.  Should he be on both?  He is out of the Losartan.  She thinks the patient didn't get Losartan in the hospital. Confused about what to take.

## 2013-02-13 ENCOUNTER — Telehealth: Payer: Self-pay | Admitting: Internal Medicine

## 2013-02-13 NOTE — Telephone Encounter (Signed)
Pt's spouse called request a call back from the nurse concern about medication that need to be send to the drug store : metformin, tamsulosin. Pt have a question about losartan that wants to ask the nurse. Please call pt.

## 2013-02-14 NOTE — Telephone Encounter (Signed)
Returned call back to phone number provided//[email or web address] to call back with specifics of questions or have pharmacy to send refill request.

## 2013-02-18 ENCOUNTER — Telehealth: Payer: Self-pay

## 2013-02-18 DIAGNOSIS — G8918 Other acute postprocedural pain: Secondary | ICD-10-CM

## 2013-02-18 MED ORDER — HYDROCODONE-ACETAMINOPHEN 5-325 MG PO TABS: 1.0000 | ORAL_TABLET | Freq: Four times a day (QID) | ORAL | Status: DC | PRN

## 2013-02-18 NOTE — Telephone Encounter (Signed)
Rec'd phone call from pt's physical therapist and wife regarding significant swelling of right leg from foot up to groin.  The Physical Therapist reported "pitting edema in right foot."  Stated the right groin incision has an area that has opened, and now has increased in size today.  Wife reports there is a thin, bloody drainage from the wound.  Wife reports she is cleaning the area with antibacterial soap and applying a dry gauze dsg. To area as needed.  Denies any fever.  Denies any redness of surrounding tissue.  Stated the leg incisions look clean/dry and appear to be healing.  Wife states the right leg swelling goes down somewhat when pt. elevates, and then increases with pt. standing and walking.   Encouraged to continue to elevate the right leg above level of heart.  Wife also requested refill on pain medication.  Discussed with Dr. Hart Rochester.  Recommends pt. Schedule appt. To have right groin wound checked this week; recommended to order Norco for pain; strongly recommended continued elevation of leg above heart level.  Ret'd. call to both Physical Therapist and patient; gave Dr. Candie Chroman recommendations.  Wife verb. Understanding; agrees to bring pt. To appt. On 7/31 @ 2:00 PM.

## 2013-02-19 ENCOUNTER — Encounter: Payer: Self-pay | Admitting: Vascular Surgery

## 2013-02-20 ENCOUNTER — Encounter: Payer: Self-pay | Admitting: Vascular Surgery

## 2013-02-20 ENCOUNTER — Encounter (INDEPENDENT_AMBULATORY_CARE_PROVIDER_SITE_OTHER): Payer: BC Managed Care – PPO | Admitting: Vascular Surgery

## 2013-02-20 ENCOUNTER — Ambulatory Visit (INDEPENDENT_AMBULATORY_CARE_PROVIDER_SITE_OTHER): Payer: BC Managed Care – PPO | Admitting: Vascular Surgery

## 2013-02-20 VITALS — BP 136/66 | HR 70 | Temp 98.2°F | Resp 16 | Ht 65.0 in | Wt 144.0 lb

## 2013-02-20 DIAGNOSIS — G8918 Other acute postprocedural pain: Secondary | ICD-10-CM

## 2013-02-20 DIAGNOSIS — I739 Peripheral vascular disease, unspecified: Secondary | ICD-10-CM

## 2013-02-20 DIAGNOSIS — M79609 Pain in unspecified limb: Secondary | ICD-10-CM | POA: Insufficient documentation

## 2013-02-20 DIAGNOSIS — Z48812 Encounter for surgical aftercare following surgery on the circulatory system: Secondary | ICD-10-CM

## 2013-02-20 NOTE — Progress Notes (Signed)
Patient is a 67 year old male who recently underwent right femoral to below-knee popliteal bypass with vein by Dr. Hart Rochester. He presented to the office today with complaints of separation of his right groin incision. He denies any significant drainage. He has had considerable swelling of his right lower extremity. He thinks his right leg is better but has not really been able to walk enough to see if his claudication is improved. He denies fever or chills. He was placed in a short course of antibiotics has now completed.  Physical exam:  Filed Vitals:   02/20/13 1427  BP: 136/66  Pulse: 70  Temp: 98.2 F (36.8 C)  TempSrc: Oral  Resp: 16  Height: 5\' 5"  (1.651 m)  Weight: 144 lb (65.318 kg)  SpO2: 99%   Right lower extremity: Diffuse edema in the hip to the foot, right foot is warm and well-perfused pulses not easily palpable secondary to edema, all incisions healing well except for the right upper thigh incision which has some maceration of the scan and slight separation. This was debrided sharply back to healthy tissue today. The right groin also has some mild amount of swelling either a small hematoma or possible seroma  Data: The patient had a graft duplex scan today which shows his femoropopliteal is patent. There is no obvious hematoma he did have some enlarged lymph nodes in the right groin there is diffuse edema. I reviewed and interpreted this study.  Assessment: Postop edema right lower extremity with some mild breakdown of his upper thigh wound. No obvious invasive infection  Plan: Local wound care for right groin family and patient were instructed on how to care for this today with hydrogel dressings the patient has followup scheduled Dr. Hart Rochester soon  Fabienne Bruns, MD [facility] [place] Office: [phone] Pager: [phone]

## 2013-02-25 ENCOUNTER — Ambulatory Visit (INDEPENDENT_AMBULATORY_CARE_PROVIDER_SITE_OTHER): Payer: BC Managed Care – PPO | Admitting: Physician Assistant

## 2013-02-25 ENCOUNTER — Encounter: Payer: Self-pay | Admitting: Physician Assistant

## 2013-02-25 VITALS — BP 120/78 | HR 70 | Ht 65.0 in | Wt 145.0 lb

## 2013-02-25 DIAGNOSIS — E785 Hyperlipidemia, unspecified: Secondary | ICD-10-CM

## 2013-02-25 DIAGNOSIS — I10 Essential (primary) hypertension: Secondary | ICD-10-CM

## 2013-02-25 DIAGNOSIS — R0989 Other specified symptoms and signs involving the circulatory and respiratory systems: Secondary | ICD-10-CM

## 2013-02-25 MED ORDER — ATORVASTATIN CALCIUM 20 MG PO TABS: 20.0000 mg | ORAL_TABLET | Freq: Every day | ORAL | Status: DC

## 2013-02-25 MED ORDER — LOSARTAN POTASSIUM 100 MG PO TABS: 100.0000 mg | ORAL_TABLET | Freq: Every day | ORAL | Status: DC

## 2013-02-25 NOTE — Progress Notes (Signed)
  [address], [address] [place], [place]  [address] Phone: [phone] Fax:  [phone]  Date:  02/25/2013   ID:  Steven [name], Steven [name] 07-30-1945, MRN [id number]  PCP:  Sanda Linger, MD  Cardiologist:  Dr. Rollene Rotunda     Patient is only here to get medications refilled.  He was not offered a refill over the phone unless he came in for an appointment. I just saw him before his LE bypass surgery.  We updated his medication list.  I have refilled his Losartan and Atorvastatin.  He will follow up with Dr. Rollene Rotunda as planned.  No charge for today.  Signed,  Tereso Newcomer, PA-C   02/25/2013 2:57 PM

## 2013-02-26 ENCOUNTER — Telehealth: Payer: Self-pay | Admitting: Internal Medicine

## 2013-02-26 DIAGNOSIS — E139 Other specified diabetes mellitus without complications: Secondary | ICD-10-CM

## 2013-02-26 MED ORDER — METFORMIN HCL 1000 MG PO TABS: 1000.0000 mg | ORAL_TABLET | Freq: Two times a day (BID) | ORAL | Status: DC

## 2013-02-26 MED ORDER — INSULIN DETEMIR 100 UNIT/ML ~~LOC~~ SOLN: 30.0000 [IU] | Freq: Every day | SUBCUTANEOUS | Status: DC

## 2013-02-26 NOTE — Telephone Encounter (Signed)
Pt need all of his diabetic medicine called in.  He has been out for 2 weeks.  [facility] on [address]. He has an appt on Friday.

## 2013-02-27 ENCOUNTER — Telehealth: Payer: Self-pay

## 2013-02-27 MED ORDER — HYDROCODONE-ACETAMINOPHEN 5-325 MG PO TABS: 1.0000 | ORAL_TABLET | Freq: Four times a day (QID) | ORAL | Status: DC | PRN

## 2013-02-27 NOTE — Telephone Encounter (Signed)
Phone call from pt.  Requested refill on pain medication.  Rates incisional pain at "5/10".  States the swelling of right leg has improved; continues to elevate it, at intervals, above level of heart.  States has occas. chills in the PM, but denies fever.  States right groin incision is improved from last office eval on 7/31; describes a "yellow" drainage on soiled dressing, with wound care, but denies any active drainage.  States has very small opening on right leg/ below the knee incision; denies any drainage.  States area had a scab over it, which has fallen off.  Describes that surrounding tissue is "slightly more red"; states had a little bleeding from site where scab fell off, but no further bleeding or drainage.  Advised to continue to elevate right leg above level of heart.  Adv. to call office if symptoms worsen, ie: increased redness, warmth, drainage, fever, or worsening opening of incision.  Has appt. with Dr. Hart Rochester 03/04/13.  Pt. Verb. understanding of instructions.  Advised will call in refill on pain medication.

## 2013-02-28 ENCOUNTER — Ambulatory Visit: Payer: BC Managed Care – PPO | Admitting: Internal Medicine

## 2013-02-28 ENCOUNTER — Other Ambulatory Visit: Payer: Self-pay | Admitting: *Deleted

## 2013-02-28 DIAGNOSIS — N390 Urinary tract infection, site not specified: Secondary | ICD-10-CM

## 2013-02-28 MED ORDER — CIPROFLOXACIN HCL 500 MG PO TABS: 500.0000 mg | ORAL_TABLET | Freq: Two times a day (BID) | ORAL | Status: AC

## 2013-02-28 NOTE — Progress Notes (Signed)
Patient called c/o blood in urine x 1 day. He is afebrile, has slight burning pain and has frequency. He is not on Plavix but does take an aspirin 81mg  daily. NKDA. Dr. Imogene Burn has ordered Cipro 500mg  BID x 3 days for UTI symptoms but did say that patient needs to be further evaluated by his PCP or urologist asap. I discuss this with the patient and he voiced understanding that he needs to contact his PCP for workup. His next appt is on 03-04-13 with Dr. Hart Rochester (s/p

## 2013-03-03 ENCOUNTER — Encounter: Payer: Self-pay | Admitting: Vascular Surgery

## 2013-03-04 ENCOUNTER — Ambulatory Visit (INDEPENDENT_AMBULATORY_CARE_PROVIDER_SITE_OTHER): Payer: BC Managed Care – PPO | Admitting: Vascular Surgery

## 2013-03-04 ENCOUNTER — Encounter: Payer: Self-pay | Admitting: Vascular Surgery

## 2013-03-04 ENCOUNTER — Other Ambulatory Visit: Payer: Self-pay | Admitting: *Deleted

## 2013-03-04 VITALS — BP 142/70 | HR 80 | Resp 18 | Ht 65.0 in | Wt 145.0 lb

## 2013-03-04 DIAGNOSIS — I739 Peripheral vascular disease, unspecified: Secondary | ICD-10-CM

## 2013-03-04 DIAGNOSIS — I70219 Atherosclerosis of native arteries of extremities with intermittent claudication, unspecified extremity: Secondary | ICD-10-CM

## 2013-03-04 NOTE — Progress Notes (Signed)
Subjective:     Patient ID: Steven [name], male   DOB: 08/23/1945, 67 y.o.   MRN: [id number]  HPI this 67 year old male returns 3 weeks post right femoral-popliteal bypass graft for severe claudication symptoms. He developed separation of the right inguinal wound she has been treating with dressing changes at home. He was receiving physical therapy but he refused to continue that. He has had no chills and fever. He is ambulating short distances.  Review of Systems     Objective:   Physical Exam BP 142/70  Pulse 80  Resp 18  Ht 5\' 5"  (1.651 m)  Wt 145 lb (65.772 kg)  BMI 24.13 kg/m2  General alert and oriented x3 in no apparent distress  right lower extremity with separation of over a distance of 4-5 cm-distal aspect of a wound. No purulent drainage. A few sutures were removed. Other incisions along the medial aspect of the right leg-vein harvesting are well healed. 2+ posterior tibial pulse palpable. No ulcerations are noted      right inguinal wound separation status post right femoral-popliteal bypass graft           Plan:     Will ask home health to change dressing 2-3 times per week and instruct family regarding twice a day dressing changes. Return to see me in 4 weeks

## 2013-03-07 ENCOUNTER — Ambulatory Visit: Payer: BC Managed Care – PPO | Admitting: *Deleted

## 2013-03-11 DIAGNOSIS — Z0279 Encounter for issue of other medical certificate: Secondary | ICD-10-CM

## 2013-03-17 ENCOUNTER — Telehealth: Payer: Self-pay | Admitting: Vascular Surgery

## 2013-03-17 NOTE — Telephone Encounter (Signed)
Spoke with pt's wife.  Reported pt. awakened this AM with being "a little disoriented", and stated he "didn't feel good".  Stated pt. had been awake much of the night, and went to bed about 5:00 AM.  Did not know any specific reason he had been awake all night.  Reported "he walked in the park yesterday, and may have overdone it."  Stated the right groin area is slowly healing, but not completely closed.  Denies any increased redness/ inflammation or drainage from (R) groin wound.  Stated pt's left leg (nonoperative leg) is "always cold, and feels ice cold".  Reports the right leg "feels warm".  Wife describes the incisional area of right leg as being "dark", and the calf area being "dark".  Stated the swelling of the right leg is decreased, but the "foot is swollen a little."  Wife requests that the follow up appt. be made earlier than 04/01/13, due to plans to travel to [place].  Appt. given for 8:30 AM 03/18/13.  Advised wife if any reoccurance of disorientation, or signs/ symptoms of difficulty with speech, facial droop, weakness of extremities, or temporary loss of vision in one eye, should be evaluated immediately; and pt. should go to the ER.  Encouraged to notify PCP of pt's disorientation this AM.  Verb. Understanding.

## 2013-03-17 NOTE — Telephone Encounter (Signed)
Received call from [name].  He thinks his post-op appointment needs to be moved up sooner.    He woke up this morning not feeling well.  He went on a walk in the park yesterday and doesn't know if he may have overdone it.  His "surgery" foot is much warmer than the other.  Swelling had went down but is now returning.   He doesn't think he has a tempeture.  Please call [phone].  Juliette Alcide

## 2013-03-18 ENCOUNTER — Ambulatory Visit (INDEPENDENT_AMBULATORY_CARE_PROVIDER_SITE_OTHER): Payer: BC Managed Care – PPO | Admitting: Vascular Surgery

## 2013-03-18 ENCOUNTER — Encounter: Payer: Self-pay | Admitting: Vascular Surgery

## 2013-03-18 VITALS — BP 159/72 | HR 56 | Temp 98.1°F | Resp 16 | Ht 65.0 in | Wt 142.0 lb

## 2013-03-18 DIAGNOSIS — G8918 Other acute postprocedural pain: Secondary | ICD-10-CM

## 2013-03-18 DIAGNOSIS — M79609 Pain in unspecified limb: Secondary | ICD-10-CM

## 2013-03-18 DIAGNOSIS — I739 Peripheral vascular disease, unspecified: Secondary | ICD-10-CM

## 2013-03-18 NOTE — Addendum Note (Signed)
Addended by: Adria Dill L on: 03/18/2013 10:35 AM   Modules accepted: Orders

## 2013-03-18 NOTE — Progress Notes (Signed)
Subjective:     Patient ID: Steven [name], male   DOB: 24-Aug-1945, 67 y.o.   MRN: [id number]  HPI this 67 year old male returns for continued followup guarding his right femoral popliteal saphenous vein graft performed in July of 2014 for severe claudication. His claudication symptoms have been relieved. He is having some mild edema when he ambulates. The inguinal wound has been seen by home health on occasion and has granulated nicely. He's had no chills and fever. He does have left calf claudication symptoms but it is difficult to determine how severe at the present time.   Review of Systems     Objective:   Physical Exam BP 159/72  Pulse 56  Temp(Src) 98.1 F (36.7 C) (Oral)  Resp 16  Ht 5\' 5"  (1.651 m)  Wt 142 lb (64.411 kg)  BMI 23.63 kg/m2  SpO2 98%  General well-developed well-nourished male no apparent stress alert and oriented x3 Right lower extremity with well-healed incisions except for the lower 3 cm of the right inguinal incision which is granulating in very superficial and should heal within the next few weeks. 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    Plan:     We'll have patient return in 2-3 months for duplex scan of right femoral popliteal graft and ABIs and discussed left leg femoral-popliteal grafting at that time depending on his symptoms

## 2013-03-25 ENCOUNTER — Other Ambulatory Visit: Payer: Self-pay | Admitting: *Deleted

## 2013-03-25 ENCOUNTER — Telehealth: Payer: Self-pay | Admitting: Internal Medicine

## 2013-03-25 MED ORDER — METOPROLOL SUCCINATE ER 50 MG PO TB24: 50.0000 mg | ORAL_TABLET | Freq: Every day | ORAL | Status: DC

## 2013-03-25 NOTE — Telephone Encounter (Signed)
Patient states he left message with Triage nurse today but has not heard anything in response.  He was over this was so he thought he would stop by.  He is requesting a refill on metoprolol succ er 50 mg tab.  Patient is going out of town next week and would like a refill before he leaves.  Patient uses [facility] on [address] for pharmacy.  Please give a call in regard.

## 2013-03-26 NOTE — Telephone Encounter (Signed)
Message address by triage, closing phone note.

## 2013-03-30 ENCOUNTER — Other Ambulatory Visit: Payer: Self-pay | Admitting: Internal Medicine

## 2013-04-01 ENCOUNTER — Ambulatory Visit: Payer: BC Managed Care – PPO | Admitting: Vascular Surgery

## 2013-05-22 ENCOUNTER — Encounter: Payer: Self-pay | Admitting: Internal Medicine

## 2013-05-22 ENCOUNTER — Ambulatory Visit (INDEPENDENT_AMBULATORY_CARE_PROVIDER_SITE_OTHER): Payer: Medicare Other | Admitting: Internal Medicine

## 2013-05-22 ENCOUNTER — Other Ambulatory Visit (INDEPENDENT_AMBULATORY_CARE_PROVIDER_SITE_OTHER): Payer: Medicare Other

## 2013-05-22 VITALS — BP 120/60 | HR 60 | Temp 98.2°F | Resp 16 | Ht 65.0 in | Wt 143.5 lb

## 2013-05-22 DIAGNOSIS — E119 Type 2 diabetes mellitus without complications: Secondary | ICD-10-CM

## 2013-05-22 DIAGNOSIS — I10 Essential (primary) hypertension: Secondary | ICD-10-CM

## 2013-05-22 DIAGNOSIS — Z23 Encounter for immunization: Secondary | ICD-10-CM

## 2013-05-22 DIAGNOSIS — I798 Other disorders of arteries, arterioles and capillaries in diseases classified elsewhere: Secondary | ICD-10-CM

## 2013-05-22 DIAGNOSIS — E139 Other specified diabetes mellitus without complications: Secondary | ICD-10-CM

## 2013-05-22 DIAGNOSIS — N189 Chronic kidney disease, unspecified: Secondary | ICD-10-CM

## 2013-05-22 LAB — BASIC METABOLIC PANEL
BUN: 19 mg/dL (ref 6–23)
Chloride: 103 mEq/L (ref 96–112)
Creatinine, Ser: 1.3 mg/dL (ref 0.4–1.5)
GFR: 68.77 mL/min (ref >60.00)
Glucose, Bld: 132 mg/dL — ABNORMAL HIGH (ref 70–99)
Potassium: 3.8 mEq/L (ref 3.5–5.1)

## 2013-05-22 NOTE — Progress Notes (Signed)
Subjective:    Patient ID: Steven [name], male    DOB: Feb 09, 1946, 67 y.o.   MRN: [id number]  Diabetes He presents for his follow-up diabetic visit. He has type 2 diabetes mellitus. There are no hypoglycemic associated symptoms. Pertinent negatives for diabetes include no blurred vision, no chest pain, no fatigue, no foot paresthesias, no foot ulcerations, no polydipsia, no polyphagia, no polyuria, no visual change, no weakness and no weight loss. There are no hypoglycemic complications. Symptoms are stable. Diabetic complications include PVD and retinopathy. Current diabetic treatment includes insulin injections and oral agent (monotherapy). He is compliant with treatment most of the time. His weight is stable. He is following a generally healthy diet. Meal planning includes avoidance of concentrated sweets. He has not had a previous visit with a dietician. He participates in exercise intermittently. There is no change in his home blood glucose trend. An ACE inhibitor/angiotensin II receptor blocker is being taken. He does not see a podiatrist.Eye exam is current.      Review of Systems  Constitutional: Negative.  Negative for fever, chills, weight loss, diaphoresis, appetite change and fatigue.  HENT: Negative.   Eyes: Negative.  Negative for blurred vision.  Respiratory: Negative.  Negative for cough, choking, chest tightness, shortness of breath, wheezing and stridor.   Cardiovascular: Negative.  Negative for chest pain, palpitations and leg swelling.  Gastrointestinal: Negative.  Negative for nausea, abdominal pain, diarrhea, constipation and blood in stool.  Endocrine: Negative.  Negative for polydipsia, polyphagia and polyuria.  Genitourinary: Negative.   Musculoskeletal: Negative.  Negative for arthralgias, joint swelling and neck pain.  Skin: Negative.   Allergic/Immunologic: Negative.   Neurological: Negative.  Negative for weakness.  Hematological: Negative.  Negative for  adenopathy. Does not bruise/bleed easily.  Psychiatric/Behavioral: Negative.        Objective:   Physical Exam  Vitals reviewed. Constitutional: He is oriented to person, place, and time. He appears well-developed and well-nourished. No distress.  HENT:  Head: Normocephalic and atraumatic.  Mouth/Throat: Oropharynx is clear and moist. No oropharyngeal exudate.  Eyes: Conjunctivae are normal. Right eye exhibits no discharge. Left eye exhibits no discharge. No scleral icterus.  Neck: Normal range of motion. Neck supple. No JVD present. No tracheal deviation present. No thyromegaly present.  Cardiovascular: Normal rate, regular rhythm, normal heart sounds and intact distal pulses.  Exam reveals decreased pulses. Exam reveals no gallop and no friction rub.   No murmur heard. Pulses:      Carotid pulses are 1+ on the right side, and 1+ on the left side.      Radial pulses are 1+ on the right side, and 1+ on the left side.       Femoral pulses are 1+ on the right side, and 1+ on the left side.      Popliteal pulses are 1+ on the right side, and 1+ on the left side.       Dorsalis pedis pulses are 1+ on the right side, and 1+ on the left side.       Posterior tibial pulses are 1+ on the right side, and 1+ on the left side.  Pulmonary/Chest: Effort normal and breath sounds normal. No stridor. No respiratory distress. He has no wheezes. He has no rales. He exhibits no tenderness.  Abdominal: Soft. Bowel sounds are normal. He exhibits no distension and no mass. There is no tenderness. There is no rebound and no guarding.  Musculoskeletal: Normal range of motion. He exhibits no edema  and no tenderness.  Lymphadenopathy:    He has no cervical adenopathy.  Neurological: He is oriented to person, place, and time.  Skin: Skin is warm and dry. No rash noted. He is not diaphoretic. No erythema. No pallor.  Psychiatric: He has a normal mood and affect. His behavior is normal. Judgment and thought content  normal.     Lab Results  Component Value Date   WBC 6.6 02/09/2013   HGB 9.6* 02/09/2013   HCT 26.4* 02/09/2013   PLT 131* 02/09/2013   GLUCOSE 130* 02/09/2013   CHOL 177 07/22/2012   TRIG 164.0* 07/22/2012   HDL 62.90 07/22/2012   LDLCALC 81 07/22/2012   ALT 18 02/03/2013   AST 22 02/03/2013   NA 134* 02/09/2013   K 3.4* 02/09/2013   CL 99 02/09/2013   CREATININE 1.52* 02/09/2013   BUN 16 02/09/2013   CO2 25 02/09/2013   TSH 1.11 08/08/2012   PSA 3.27 08/08/2012   INR 1.07 02/03/2013   HGBA1C 6.3* 02/07/2013   MICROALBUR 5.2* 08/08/2012       Assessment & Plan:

## 2013-05-22 NOTE — Patient Instructions (Signed)
Type 2 Diabetes Mellitus, Adult Type 2 diabetes mellitus, often simply referred to as type 2 diabetes, is a long-lasting (chronic) disease. In type 2 diabetes, the pancreas does not make enough insulin (a hormone), the cells are less responsive to the insulin that is made (insulin resistance), or both. Normally, insulin moves sugars from food into the tissue cells. The tissue cells use the sugars for energy. The lack of insulin or the lack of normal response to insulin causes excess sugars to build up in the blood instead of going into the tissue cells. As a result, high blood sugar (hyperglycemia) develops. The effect of high sugar (glucose) levels can cause many complications. Type 2 diabetes was also previously called adult-onset diabetes but it can occur at any age.  RISK FACTORS  A person is predisposed to developing type 2 diabetes if someone in the family has the disease and also has one or more of the following primary risk factors:  Overweight.  An inactive lifestyle.  A history of consistently eating high-calorie foods. Maintaining a normal weight and regular physical activity can reduce the chance of developing type 2 diabetes. SYMPTOMS  A person with type 2 diabetes may not show symptoms initially. The symptoms of type 2 diabetes appear slowly. The symptoms include:  Increased thirst (polydipsia).  Increased urination (polyuria).  Increased urination during the night (nocturia).  Weight loss. This weight loss may be rapid.  Frequent, recurring infections.  Tiredness (fatigue).  Weakness.  Vision changes, such as blurred vision.  Fruity smell to your breath.  Abdominal pain.  Nausea or vomiting.  Cuts or bruises which are slow to heal.  Tingling or numbness in the hands or feet. DIAGNOSIS Type 2 diabetes is frequently not diagnosed until complications of diabetes are present. Type 2 diabetes is diagnosed when symptoms or complications are present and when blood  glucose levels are increased. Your blood glucose level may be checked by one or more of the following blood tests:  A fasting blood glucose test. You will not be allowed to eat for at least 8 hours before a blood sample is taken.  A random blood glucose test. Your blood glucose is checked at any time of the day regardless of when you ate.  A hemoglobin A1c blood glucose test. A hemoglobin A1c test provides information about blood glucose control over the previous 3 months.  An oral glucose tolerance test (OGTT). Your blood glucose is measured after you have not eaten (fasted) for 2 hours and then after you drink a glucose-containing beverage. TREATMENT   You may need to take insulin or diabetes medicine daily to keep blood glucose levels in the desired range.  You will need to match insulin dosing with exercise and healthy food choices. The treatment goal is to maintain the before meal blood sugar (preprandial glucose) level at 70 130 mg/dL. HOME CARE INSTRUCTIONS   Have your hemoglobin A1c level checked twice a year.  Perform daily blood glucose monitoring as directed by your caregiver.  Monitor urine ketones when you are ill and as directed by your caregiver.  Take your diabetes medicine or insulin as directed by your caregiver to maintain your blood glucose levels in the desired range.  Never run out of diabetes medicine or insulin. It is needed every day.  Adjust insulin based on your intake of carbohydrates. Carbohydrates can raise blood glucose levels but need to be included in your diet. Carbohydrates provide vitamins, minerals, and fiber which are an essential part of   a healthy diet. Carbohydrates are found in fruits, vegetables, whole grains, dairy products, legumes, and foods containing added sugars.    Eat healthy foods. Alternate 3 meals with 3 snacks.  Lose weight if overweight.  Carry a medical alert card or wear your medical alert jewelry.  Carry a 15 gram  carbohydrate snack with you at all times to treat low blood glucose (hypoglycemia). Some examples of 15 gram carbohydrate snacks include:  Glucose tablets, 3 or 4   Glucose gel, 15 gram tube  Raisins, 2 tablespoons (24 grams)  Jelly beans, 6  Animal crackers, 8  Regular pop, 4 ounces (120 mL)  Gummy treats, 9  Recognize hypoglycemia. Hypoglycemia occurs with blood glucose levels of 70 mg/dL and below. The risk for hypoglycemia increases when fasting or skipping meals, during or after intense exercise, and during sleep. Hypoglycemia symptoms can include:  Tremors or shakes.  Decreased ability to concentrate.  Sweating.  Increased heart rate.  Headache.  Dry mouth.  Hunger.  Irritability.  Anxiety.  Restless sleep.  Altered speech or coordination.  Confusion.  Treat hypoglycemia promptly. If you are alert and able to safely swallow, follow the 15:15 rule:  Take 15 20 grams of rapid-acting glucose or carbohydrate. Rapid-acting options include glucose gel, glucose tablets, or 4 ounces (120 mL) of fruit juice, regular soda, or low fat milk.  Check your blood glucose level 15 minutes after taking the glucose.  Take 15 20 grams more of glucose if the repeat blood glucose level is still 70 mg/dL or below.  Eat a meal or snack within 1 hour once blood glucose levels return to normal.    Be alert to polyuria and polydipsia which are early signs of hyperglycemia. An early awareness of hyperglycemia allows for prompt treatment. Treat hyperglycemia as directed by your caregiver.  Engage in at least 150 minutes of moderate-intensity physical activity a week, spread over at least 3 days of the week or as directed by your caregiver. In addition, you should engage in resistance exercise at least 2 times a week or as directed by your caregiver.  Adjust your medicine and food intake as needed if you start a new exercise or sport.  Follow your sick day plan at any time you  are unable to eat or drink as usual.  Avoid tobacco use.  Limit alcohol intake to no more than 1 drink per day for nonpregnant women and 2 drinks per day for men. You should drink alcohol only when you are also eating food. Talk with your caregiver whether alcohol is safe for you. Tell your caregiver if you drink alcohol several times a week.  Follow up with your caregiver regularly.  Schedule an eye exam soon after the diagnosis of type 2 diabetes and then annually.  Perform daily skin and foot care. Examine your skin and feet daily for cuts, bruises, redness, nail problems, bleeding, blisters, or sores. A foot exam by a caregiver should be done annually.  Brush your teeth and gums at least twice a day and floss at least once a day. Follow up with your dentist regularly.  Share your diabetes management plan with your workplace or school.  Stay up-to-date with immunizations.  Learn to manage stress.  Obtain ongoing diabetes education and support as needed.  Participate in, or seek rehabilitation as needed to maintain or improve independence and quality of life. Request a physical or occupational therapy referral if you are having foot or hand numbness or difficulties with grooming,   dressing, eating, or physical activity. SEEK MEDICAL CARE IF:   You are unable to eat food or drink fluids for more than 6 hours.  You have nausea and vomiting for more than 6 hours.  Your blood glucose level is over 240 mg/dL.  There is a change in mental status.  You develop an additional serious illness.  You have diarrhea for more than 6 hours.  You have been sick or have had a fever for a couple of days and are not getting better.  You have pain during any physical activity.  SEEK IMMEDIATE MEDICAL CARE IF:  You have difficulty breathing.  You have moderate to large ketone levels. MAKE SURE YOU:  Understand these instructions.  Will watch your condition.  Will get help right away if  you are not doing well or get worse. Document Released: 07/10/2005 Document Revised: 04/03/2012 Document Reviewed: 02/06/2012 [employer] Patient Information 2014 [employer].  

## 2013-05-23 NOTE — Assessment & Plan Note (Signed)
His BP is well controlled 

## 2013-05-23 NOTE — Assessment & Plan Note (Signed)
His renal function is stable 

## 2013-05-23 NOTE — Assessment & Plan Note (Signed)
His blood sugars are well controlled 

## 2013-05-29 ENCOUNTER — Other Ambulatory Visit: Payer: Self-pay | Admitting: Vascular Surgery

## 2013-05-29 ENCOUNTER — Telehealth: Payer: Self-pay | Admitting: Internal Medicine

## 2013-05-29 DIAGNOSIS — M79609 Pain in unspecified limb: Secondary | ICD-10-CM

## 2013-05-29 DIAGNOSIS — Z48812 Encounter for surgical aftercare following surgery on the circulatory system: Secondary | ICD-10-CM

## 2013-05-29 DIAGNOSIS — I739 Peripheral vascular disease, unspecified: Secondary | ICD-10-CM

## 2013-05-29 MED ORDER — TAMSULOSIN HCL 0.4 MG PO CAPS: 0.4000 mg | ORAL_CAPSULE | Freq: Every day | ORAL | Status: DC

## 2013-05-29 NOTE — Telephone Encounter (Signed)
Requesting refill on flomax to be sent to [facility] on [address]

## 2013-05-29 NOTE — Telephone Encounter (Signed)
Rx sent e-script to [facility]

## 2013-06-05 ENCOUNTER — Telehealth: Payer: Self-pay

## 2013-06-05 DIAGNOSIS — E139 Other specified diabetes mellitus without complications: Secondary | ICD-10-CM

## 2013-06-05 MED ORDER — INSULIN GLARGINE 100 UNIT/ML SOLOSTAR PEN: 30.0000 [IU] | PEN_INJECTOR | Freq: Every day | SUBCUTANEOUS | Status: DC

## 2013-06-05 NOTE — Telephone Encounter (Signed)
Levemir not covered without trying or failing lantus, denied per insurance. Thanks

## 2013-06-23 ENCOUNTER — Encounter: Payer: Self-pay | Admitting: Vascular Surgery

## 2013-06-24 ENCOUNTER — Encounter (HOSPITAL_COMMUNITY): Payer: PRIVATE HEALTH INSURANCE

## 2013-06-24 ENCOUNTER — Ambulatory Visit (INDEPENDENT_AMBULATORY_CARE_PROVIDER_SITE_OTHER)
Admission: RE | Admit: 2013-06-24 | Discharge: 2013-06-24 | Disposition: A | Discharge status: Home / Self Care | Payer: Medicare Other | Source: Ambulatory Visit | Attending: Vascular Surgery | Admitting: Vascular Surgery

## 2013-06-24 ENCOUNTER — Encounter: Payer: Self-pay | Admitting: Vascular Surgery

## 2013-06-24 ENCOUNTER — Ambulatory Visit (INDEPENDENT_AMBULATORY_CARE_PROVIDER_SITE_OTHER): Payer: Medicare Other | Admitting: Vascular Surgery

## 2013-06-24 ENCOUNTER — Ambulatory Visit (HOSPITAL_COMMUNITY)
Admission: RE | Admit: 2013-06-24 | Discharge: 2013-06-24 | Disposition: A | Discharge status: Home / Self Care | Payer: Medicare Other | Source: Ambulatory Visit | Attending: Vascular Surgery | Admitting: Vascular Surgery

## 2013-06-24 ENCOUNTER — Other Ambulatory Visit (HOSPITAL_COMMUNITY): Payer: PRIVATE HEALTH INSURANCE

## 2013-06-24 VITALS — BP 179/92 | HR 46 | Resp 16 | Ht 65.0 in | Wt 145.0 lb

## 2013-06-24 DIAGNOSIS — Z48812 Encounter for surgical aftercare following surgery on the circulatory system: Secondary | ICD-10-CM

## 2013-06-24 DIAGNOSIS — I739 Peripheral vascular disease, unspecified: Secondary | ICD-10-CM | POA: Insufficient documentation

## 2013-06-24 DIAGNOSIS — M79609 Pain in unspecified limb: Secondary | ICD-10-CM

## 2013-06-24 DIAGNOSIS — I70219 Atherosclerosis of native arteries of extremities with intermittent claudication, unspecified extremity: Secondary | ICD-10-CM | POA: Diagnosis not present

## 2013-06-24 NOTE — Progress Notes (Signed)
Subjective:     Patient ID: Steven [name], male   DOB: 1945/11/24, 67 y.o.   MRN: [id number]  HPI this 67 year old male returns for followup regarding his right femoral popliteal vein graft which was performed in July of this year. He denies any calf claudication symptoms. He does have some right hip discomfort he initially arises which sounds more mechanical in nature. He does have some mild left calf claudication symptoms which are not limiting him at the present time. He denies any history of nonhealing ulcers infection or gangrene in either lower extremity.  Past Medical History  Diagnosis Date  . Coronary artery disease     Lexiscan Myoview 7/14:  Inf scar with minimal reversibility, inf HK, EF 45%;  Low risk  . Diabetes mellitus without complication   . Dyslipidemia   . Hypertension   . Peripheral vascular disease     a. s/p R fem-pop BPG 01/2013 (Dr. Darrick Penna)  . Hernia   . Chest pain   . Heart murmur   . Allergy   . Hyperlipidemia   . Sickle cell anemia     has trait  . Irregular heartbeat   . Shortness of breath   . GERD (gastroesophageal reflux disease)     has used Zantac in the past     History  Substance Use Topics  . Smoking status: Former Smoker    Types: Cigarettes    Quit date: 02/04/2011  . Smokeless tobacco: Never Used     Comment: Quit tobacco 2 years ago.    . Alcohol Use: 1.8 oz/week    3 Cans of beer per week     Comment: everyday, beer 40 ozs & alcohol- 1-2 shots     Family History  Problem Relation Age of Onset  . Diabetes Mother   . Hypertension Mother   . Alcohol abuse Neg Hx   . Cancer Neg Hx   . Depression Neg Hx   . Early death Neg Hx   . Heart disease Neg Hx   . Hyperlipidemia Neg Hx   . Kidney disease Neg Hx   . Stroke Neg Hx   . Colon cancer Neg Hx   . Esophageal cancer Neg Hx   . Rectal cancer Neg Hx   . Stomach cancer Neg Hx     No Known Allergies  Current outpatient prescriptions:aspirin EC 81 MG tablet, Take 81 mg by mouth  daily., Disp: , Rfl: ;  atorvastatin (LIPITOR) 20 MG tablet, Take 1 tablet (20 mg total) by mouth daily before breakfast., Disp: 30 tablet, Rfl: 11;  BD PEN NEEDLE NANO U/F 32G X 4 MM MISC, use as directed with INSULIN, Disp: 100 each, Rfl: 3;  cetirizine (ZYRTEC) 10 MG tablet, Take 10 mg by mouth daily before breakfast. , Disp: , Rfl:  diphenhydrAMINE (BENADRYL) 25 MG tablet, Take 25 mg by mouth every 6 (six) hours as needed for itching., Disp: , Rfl: ;  hydrochlorothiazide (MICROZIDE) 12.5 MG capsule, Take 25 mg by mouth daily before breakfast., Disp: , Rfl: ;  hydrocortisone (ANUSOL-HC) 2.5 % rectal cream, Place 1 application rectally 2 (two) times daily as needed. For hemorrhoids, Disp: , Rfl:  hydrocortisone 2.5 % cream, Apply topically 2 (two) times daily., Disp: 30 g, Rfl: 11;  Insulin Glargine (LANTUS SOLOSTAR) 100 UNIT/ML SOPN, Inject 30 Units into the skin daily., Disp: 3 mL, Rfl: 11;  losartan (COZAAR) 100 MG tablet, Take 1 tablet (100 mg total) by mouth daily before breakfast., Disp: 30  tablet, Rfl: 11 metFORMIN (GLUCOPHAGE) 1000 MG tablet, Take 1 tablet (1,000 mg total) by mouth 2 (two) times daily with a meal., Disp: 60 tablet, Rfl: 11;  metoprolol succinate (TOPROL-XL) 50 MG 24 hr tablet, Take 1 tablet (50 mg total) by mouth daily. Take with or immediately following a meal., Disp: 30 tablet, Rfl: 3;  mometasone (NASONEX) 50 MCG/ACT nasal spray, Place 2 sprays into the nose daily., Disp: , Rfl:  tamsulosin (FLOMAX) 0.4 MG CAPS capsule, Take 1 capsule (0.4 mg total) by mouth daily after supper., Disp: 30 capsule, Rfl: 5  There were no vitals taken for this visit.  There is no weight on file to calculate BMI.       \   Review of Systems denies chest pain, dyspnea on exertion, PND, orthopnea. Complains of pain in hip with walking, swelling in the right leg over left. All other systems negative and complete review of systems     Objective:   Physical Exam BP 179/92  Pulse 46  Resp  16  Ht 5\' 5"  (1.651 m)  Wt 145 lb (65.772 kg)  BMI 24.13 kg/m2   General well-developed well-nourished male no apparent stress alert and oriented x3 Chest no rhonchi or wheezing Cardiovascular regular and no murmurs Abdomen soft nontender with no masses Right leg with 3+ femoral popliteal pulse palpable 2+ posterior tibial pulse. Left leg with 3+ femoral but no distal pulses palpable. Both feet are well-perfused.  Today I ordered an arterial Doppler exam of the right lower extremity and a duplex scan of the femoral popliteal graft which are reviewed and interpreted. ABI on the right is 0.92 normal left is 0.49. There is no evidence of significant stenosis in the vein graft on the right.    Assessment:     Widely patent right femoral popliteal vein graft with resolution right calf claudication symptoms Stable left calf claudication due to 2 left superficial femoral occlusion    Plan:     Return in 6 months with repeat ABIs in duplex scan of right femoral popliteal bypass graft Will then determine if left calf claudication symptoms are limiting enough to require treatment

## 2013-06-26 ENCOUNTER — Encounter: Payer: Self-pay | Admitting: Podiatry

## 2013-06-26 ENCOUNTER — Ambulatory Visit (INDEPENDENT_AMBULATORY_CARE_PROVIDER_SITE_OTHER): Payer: Medicare Other | Admitting: Podiatry

## 2013-06-26 ENCOUNTER — Ambulatory Visit (INDEPENDENT_AMBULATORY_CARE_PROVIDER_SITE_OTHER): Payer: Medicare Other

## 2013-06-26 VITALS — BP 138/66 | HR 72 | Resp 17 | Ht 65.0 in | Wt 140.0 lb

## 2013-06-26 DIAGNOSIS — E1169 Type 2 diabetes mellitus with other specified complication: Secondary | ICD-10-CM

## 2013-06-26 DIAGNOSIS — E118 Type 2 diabetes mellitus with unspecified complications: Secondary | ICD-10-CM

## 2013-06-26 DIAGNOSIS — L97509 Non-pressure chronic ulcer of other part of unspecified foot with unspecified severity: Secondary | ICD-10-CM

## 2013-06-26 DIAGNOSIS — E1149 Type 2 diabetes mellitus with other diabetic neurological complication: Secondary | ICD-10-CM | POA: Diagnosis not present

## 2013-06-26 DIAGNOSIS — E1159 Type 2 diabetes mellitus with other circulatory complications: Secondary | ICD-10-CM

## 2013-06-26 DIAGNOSIS — B351 Tinea unguium: Secondary | ICD-10-CM | POA: Diagnosis not present

## 2013-06-26 DIAGNOSIS — M79609 Pain in unspecified limb: Secondary | ICD-10-CM | POA: Diagnosis not present

## 2013-06-26 NOTE — Progress Notes (Signed)
   Subjective:    Patient ID: MAXAMILLIAN TIENDA, male    DOB: [birth year], 67 y.o.   MRN: [id number]  HPIN Debridement       L B/L 1 - 5 toenails        D and O long-term       C elongated, thick and tender       A diabetes and enclosed shoes       T home pedicure, hx of periodic foot care in [place]    Review of Systems     Objective:   Physical Exam I have reviewed his past medical history medications and allergies. Vital signs are stable he is alert. Pulses are barely palpable bilateral but capillary fill time to digits one through 5 is immediate. Feet are warm to the touch. Digital hair growth is noted. His feet are warm to the touch. Neurologic sensorium is slightly decreased per Semmes-Weinstein monofilament. Deep tendon reflexes are in non-elicitable. Orthopedic evaluation demonstrates mild hallux valgus deformity with hammertoe deformities bilateral. Cutaneous evaluation demonstrates supple well hydrated cutis dorsum of the foot dry xerotic skin to the plantar aspect of the foot and nails thick yellow dystrophic clinically mycotic.        Assessment & Plan:  Assessment: Pain in limb secondary to onychomycosis 1 through 5 lateral.  Plan: Debridement of nails 1 through 5 bilateral is a covered service secondary to pain.

## 2013-07-02 ENCOUNTER — Other Ambulatory Visit: Payer: Self-pay | Admitting: *Deleted

## 2013-07-02 DIAGNOSIS — Z48812 Encounter for surgical aftercare following surgery on the circulatory system: Secondary | ICD-10-CM

## 2013-07-02 DIAGNOSIS — I739 Peripheral vascular disease, unspecified: Secondary | ICD-10-CM

## 2013-07-29 ENCOUNTER — Other Ambulatory Visit: Payer: Self-pay | Admitting: Internal Medicine

## 2013-08-06 ENCOUNTER — Other Ambulatory Visit: Payer: Self-pay | Admitting: Cardiology

## 2013-08-25 ENCOUNTER — Encounter: Payer: Self-pay | Admitting: Internal Medicine

## 2013-08-25 ENCOUNTER — Other Ambulatory Visit (INDEPENDENT_AMBULATORY_CARE_PROVIDER_SITE_OTHER): Payer: Medicare Other

## 2013-08-25 ENCOUNTER — Ambulatory Visit (INDEPENDENT_AMBULATORY_CARE_PROVIDER_SITE_OTHER): Payer: Medicare Other | Admitting: Internal Medicine

## 2013-08-25 ENCOUNTER — Ambulatory Visit (INDEPENDENT_AMBULATORY_CARE_PROVIDER_SITE_OTHER)
Admission: RE | Admit: 2013-08-25 | Discharge: 2013-08-25 | Disposition: A | Discharge status: Home / Self Care | Payer: Medicare Other | Source: Ambulatory Visit | Attending: Internal Medicine | Admitting: Internal Medicine

## 2013-08-25 VITALS — BP 146/70 | HR 56 | Temp 97.6°F | Resp 16 | Ht 65.0 in | Wt 148.0 lb

## 2013-08-25 DIAGNOSIS — I10 Essential (primary) hypertension: Secondary | ICD-10-CM

## 2013-08-25 DIAGNOSIS — E785 Hyperlipidemia, unspecified: Secondary | ICD-10-CM | POA: Insufficient documentation

## 2013-08-25 DIAGNOSIS — R05 Cough: Secondary | ICD-10-CM | POA: Diagnosis not present

## 2013-08-25 DIAGNOSIS — E119 Type 2 diabetes mellitus without complications: Secondary | ICD-10-CM

## 2013-08-25 DIAGNOSIS — J438 Other emphysema: Secondary | ICD-10-CM | POA: Diagnosis not present

## 2013-08-25 DIAGNOSIS — E139 Other specified diabetes mellitus without complications: Secondary | ICD-10-CM

## 2013-08-25 DIAGNOSIS — D510 Vitamin B12 deficiency anemia due to intrinsic factor deficiency: Secondary | ICD-10-CM

## 2013-08-25 DIAGNOSIS — R059 Cough, unspecified: Secondary | ICD-10-CM

## 2013-08-25 DIAGNOSIS — J449 Chronic obstructive pulmonary disease, unspecified: Secondary | ICD-10-CM

## 2013-08-25 DIAGNOSIS — N189 Chronic kidney disease, unspecified: Secondary | ICD-10-CM | POA: Diagnosis not present

## 2013-08-25 LAB — CBC WITH DIFFERENTIAL/PLATELET
BASOS ABS: 0.1 10*3/uL (ref 0.0–0.1)
Basophils Relative: 0.8 % (ref 0.0–3.0)
EOS ABS: 0.2 10*3/uL (ref 0.0–0.7)
Eosinophils Relative: 2.4 % (ref 0.0–5.0)
HCT: 42.2 % (ref 39.0–52.0)
Hemoglobin: 13.5 g/dL (ref 13.0–17.0)
LYMPHS PCT: 36.1 % (ref 12.0–46.0)
Lymphs Abs: 2.7 10*3/uL (ref 0.7–4.0)
MCHC: 32.1 g/dL (ref 30.0–36.0)
MCV: 83.7 fl (ref 78.0–100.0)
Monocytes Absolute: 0.6 10*3/uL (ref 0.1–1.0)
Monocytes Relative: 8 % (ref 3.0–12.0)
NEUTROS ABS: 4 10*3/uL (ref 1.4–7.7)
Neutrophils Relative %: 52.7 % (ref 43.0–77.0)
Platelets: 183 10*3/uL (ref 150.0–400.0)
RBC: 5.04 Mil/uL (ref 4.22–5.81)
RDW: 18.4 % — AB (ref 11.5–14.6)
WBC: 7.6 10*3/uL (ref 4.5–10.5)

## 2013-08-25 LAB — URINALYSIS, ROUTINE W REFLEX MICROSCOPIC
BILIRUBIN URINE: NEGATIVE
HGB URINE DIPSTICK: NEGATIVE
KETONES UR: NEGATIVE
LEUKOCYTES UA: NEGATIVE
NITRITE: NEGATIVE
RBC / HPF: NONE SEEN (ref 0–?)
Specific Gravity, Urine: 1.015 (ref 1.000–1.030)
UROBILINOGEN UA: 0.2 (ref 0.0–1.0)
Urine Glucose: NEGATIVE
pH: 5.5 (ref 5.0–8.0)

## 2013-08-25 LAB — BASIC METABOLIC PANEL
BUN: 17 mg/dL (ref 6–23)
CHLORIDE: 104 meq/L (ref 96–112)
CO2: 28 meq/L (ref 19–32)
Calcium: 9.9 mg/dL (ref 8.4–10.5)
Creatinine, Ser: 1.3 mg/dL (ref 0.4–1.5)
GFR: 71.16 mL/min (ref >60.00)
Glucose, Bld: 95 mg/dL (ref 70–99)
POTASSIUM: 3.9 meq/L (ref 3.5–5.1)
Sodium: 140 mEq/L (ref 135–145)

## 2013-08-25 LAB — IBC PANEL
IRON: 54 ug/dL (ref 42–165)
SATURATION RATIOS: 15 % — AB (ref 20.0–50.0)
TRANSFERRIN: 256.8 mg/dL (ref 212.0–360.0)

## 2013-08-25 LAB — LIPID PANEL
CHOL/HDL RATIO: 3
Cholesterol: 157 mg/dL (ref 0–200)
HDL: 53.8 mg/dL (ref >39.00)
LDL CALC: 81 mg/dL (ref 0–99)
Triglycerides: 111 mg/dL (ref 0.0–149.0)
VLDL: 22.2 mg/dL (ref 0.0–40.0)

## 2013-08-25 LAB — TSH: TSH: 1.82 u[IU]/mL (ref 0.35–5.50)

## 2013-08-25 LAB — HEMOGLOBIN A1C: Hgb A1c MFr Bld: 6.4 % (ref 4.6–6.5)

## 2013-08-25 LAB — FERRITIN: FERRITIN: 41.6 ng/mL (ref 22.0–322.0)

## 2013-08-25 MED ORDER — INSULIN DETEMIR 100 UNIT/ML FLEXPEN: 30.0000 [IU] | PEN_INJECTOR | Freq: Every day | SUBCUTANEOUS | Status: DC

## 2013-08-25 MED ORDER — ACLIDINIUM BROMIDE 400 MCG/ACT IN AEPB: 1.0000 | INHALATION_SPRAY | Freq: Two times a day (BID) | RESPIRATORY_TRACT | Status: DC

## 2013-08-25 NOTE — Progress Notes (Signed)
Subjective:    Patient ID: Steven [name], male    DOB: [birth year], 68 y.o.   MRN: [id number]  Cough This is a recurrent problem. The current episode started more than 1 month ago. The problem has been unchanged. The problem occurs every few hours. The cough is productive of sputum and productive of blood-tinged sputum. Pertinent negatives include no chest pain, chills, ear congestion, ear pain, fever, headaches, heartburn, hemoptysis, myalgias, nasal congestion, postnasal drip, rash, rhinorrhea, sore throat, shortness of breath, sweats, weight loss or wheezing. The symptoms are aggravated by cold air. He has tried OTC cough suppressant for the symptoms. The treatment provided no relief. His past medical history is significant for COPD and emphysema. There is no history of asthma, bronchiectasis, bronchitis, environmental allergies or pneumonia.      Review of Systems  Constitutional: Negative.  Negative for fever, chills, weight loss, diaphoresis, appetite change, fatigue and unexpected weight change.  HENT: Negative.  Negative for congestion, ear pain, nosebleeds, postnasal drip, rhinorrhea, sinus pressure, sore throat, tinnitus, trouble swallowing and voice change.   Eyes: Negative.   Respiratory: Positive for cough. Negative for apnea, hemoptysis, choking, chest tightness, shortness of breath, wheezing and stridor.   Cardiovascular: Negative.  Negative for chest pain, palpitations and leg swelling.  Gastrointestinal: Negative.  Negative for heartburn, nausea, vomiting, abdominal pain, diarrhea, constipation and blood in stool.  Endocrine: Negative.   Genitourinary: Negative.   Musculoskeletal: Negative.  Negative for myalgias.  Skin: Negative.  Negative for rash.  Allergic/Immunologic: Negative.  Negative for environmental allergies.  Neurological: Negative.  Negative for headaches.  Hematological: Negative.  Negative for adenopathy. Does not bruise/bleed easily.    Psychiatric/Behavioral: Negative.        Objective:   Physical Exam  Vitals reviewed. Constitutional: He is oriented to person, place, and time. He appears well-developed and well-nourished.  Non-toxic appearance. He does not have a sickly appearance. He does not appear ill. No distress.  HENT:  Head: Normocephalic and atraumatic.  Mouth/Throat: Oropharynx is clear and moist. No oropharyngeal exudate.  Eyes: Conjunctivae are normal. Right eye exhibits no discharge. Left eye exhibits no discharge. No scleral icterus.  Neck: Normal range of motion. Neck supple. No JVD present. No tracheal deviation present. No thyromegaly present.  Cardiovascular: Normal rate, regular rhythm, normal heart sounds and intact distal pulses.  Exam reveals no gallop and no friction rub.   No murmur heard. Pulmonary/Chest: Effort normal. No accessory muscle usage or stridor. Not tachypneic. No respiratory distress. He has no decreased breath sounds. He has no wheezes. He has rhonchi in the right middle field and the left middle field. He has no rales. He exhibits no tenderness.  Abdominal: Soft. Bowel sounds are normal. He exhibits no distension and no mass. There is no tenderness. There is no rebound and no guarding.  Musculoskeletal: Normal range of motion. He exhibits no edema and no tenderness.  Lymphadenopathy:    He has no cervical adenopathy.  Neurological: He is oriented to person, place, and time.  Skin: Skin is warm and dry. No rash noted. He is not diaphoretic. No erythema. No pallor.  Psychiatric: He has a normal mood and affect. His behavior is normal. Judgment and thought content normal.     Lab Results  Component Value Date   WBC 6.6 02/09/2013   HGB 9.6* 02/09/2013   HCT 26.4* 02/09/2013   PLT 131* 02/09/2013   GLUCOSE 132* 05/22/2013   CHOL 177 07/22/2012   TRIG 164.0* 07/22/2012  HDL 62.90 07/22/2012   LDLCALC 81 07/22/2012   ALT 18 02/03/2013   AST 22 02/03/2013   NA 140 05/22/2013   K  3.8 05/22/2013   CL 103 05/22/2013   CREATININE 1.3 05/22/2013   BUN 19 05/22/2013   CO2 29 05/22/2013   TSH 1.11 08/08/2012   PSA 3.27 08/08/2012   INR 1.07 02/03/2013   HGBA1C 6.5 05/22/2013   MICROALBUR 5.2* 08/08/2012       Assessment & Plan:

## 2013-08-25 NOTE — Assessment & Plan Note (Signed)
The software blocked me from ordering B12/folate Today I will recheck his CBC and will look at his iron levels

## 2013-08-25 NOTE — Patient Instructions (Signed)
Chronic Obstructive Pulmonary Disease  Chronic obstructive pulmonary disease (COPD) is a common lung condition in which airflow from the lungs is limited. COPD is a general term that can be used to describe many different lung problems that limit airflow, including both chronic bronchitis and emphysema.  If you have COPD, your lung function will probably never return to normal, but there are measures you can take to improve lung function and make yourself feel better.   CAUSES   · Smoking (common).    · Exposure to secondhand smoke.    · Genetic problems.  · Chronic inflammatory lung diseases or recurrent infections.  SYMPTOMS   · Shortness of breath, especially with physical activity.    · Deep, persistent (chronic) cough with a large amount of thick mucus.    · Wheezing.    · Rapid breaths (tachypnea).    · Gray or bluish discoloration (cyanosis) of the skin, especially in fingers, toes, or lips.    · Fatigue.    · Weight loss.    · Frequent infections or episodes when breathing symptoms become much worse (exacerbations).    · Chest tightness.  DIAGNOSIS   Your healthcare provider will take a medical history and perform a physical examination to make the initial diagnosis.  Additional tests for COPD may include:   · Lung (pulmonary) function tests.  · Chest X-ray.  · CT scan.  · Blood tests.  TREATMENT   Treatment available to help you feel better when you have COPD include:   · Inhaler and nebulizer medicines. These help manage the symptoms of COPD and make your breathing more comfortable  · Supplemental oxygen. Supplemental oxygen is only helpful if you have a low oxygen level in your blood.    · Exercise and physical activity. These are beneficial for nearly all people with COPD. Some people may also benefit from a pulmonary rehabilitation program.  HOME CARE INSTRUCTIONS   · Take all medicines (inhaled or pills) as directed by your health care provider.  · Only take over-the-counter or prescription medicines  for pain, fever, or discomfort as directed by your health care provider.    · Avoid over-the-counter medicines or cough syrups that dry up your airway (such as antihistamines) and slow down the elimination of secretions unless instructed otherwise by your healthcare provider.    · If you are a smoker, the most important thing that you can do is stop smoking. Continuing to smoke will cause further lung damage and breathing trouble. Ask your health care provider for help with quitting smoking. He or she can direct you to community resources or hospitals that provide support.  · Avoid exposure to irritants such as smoke, chemicals, and fumes that aggravate your breathing.  · Use oxygen therapy and pulmonary rehabilitation if directed by your health care provider. If you require home oxygen therapy, ask your healthcare provider whether you should purchase a pulse oximeter to measure your oxygen level at home.    · Avoid contact with individuals who have a contagious illness.  · Avoid extreme temperature and humidity changes.  · Eat healthy foods. Eating smaller, more frequent meals and resting before meals may help you maintain your strength.  · Stay active, but balance activity with periods of rest. Exercise and physical activity will help you maintain your ability to do things you want to do.  · Preventing infection and hospitalization is very important when you have COPD. Make sure to receive all the vaccines your health care provider recommends, especially the pneumococcal and influenza vaccines. Ask your healthcare provider whether you   need a pneumonia vaccine.  · Learn and use relaxation techniques to manage stress.  · Learn and use controlled breathing techniques as directed by your health care provider. Controlled breathing techniques include:    · Pursed lip breathing. Start by breathing in (inhaling) through your nose for 1 second. Then, purse your lips as if you were going to whistle and breathe out (exhale)  through the pursed lips for 2 seconds.    · Diaphragmatic breathing. Start by putting one hand on your abdomen just above your waist. Inhale slowly through your nose. The hand on your abdomen should move out. Then purse your lips and exhale slowly. You should be able to feel the hand on your abdomen moving in as you exhale.    · Learn and use controlled coughing to clear mucus from your lungs. Controlled coughing is a series of short, progressive coughs. The steps of controlled coughing are:    1. Lean your head slightly forward.    2. Breathe in deeply using diaphragmatic breathing.    3. Try to hold your breath for 3 seconds.    4. Keep your mouth slightly open while coughing twice.    5. Spit any mucus out into a tissue.    6. Rest and repeat the steps once or twice as needed.  SEEK MEDICAL CARE IF:   · You are coughing up more mucus than usual.    · There is a change in the color or thickness of your mucus.    · Your breathing is more labored than usual.    · Your breathing is faster than usual.    SEEK IMMEDIATE MEDICAL CARE IF:   · You have shortness of breath while you are resting.    · You have shortness of breath that prevents you from:  · Being able to talk.    · Performing your usual physical activities.    · You have chest pain lasting longer than 5 minutes.    · Your skin color is more cyanotic than usual.  · You measure low oxygen saturations for longer than 5 minutes with a pulse oximeter.  MAKE SURE YOU:   · Understand these instructions.  · Will watch your condition.  · Will get help right away if you are not doing well or get worse.  Document Released: 04/19/2005 Document Revised: 04/30/2013 Document Reviewed: 03/06/2013  [employer]® Patient Information ©2014 [employer].

## 2013-08-25 NOTE — Progress Notes (Signed)
Pre visit review using our clinic review tool, if applicable. No additional management support is needed unless otherwise documented below in the visit note. 

## 2013-08-27 ENCOUNTER — Encounter: Payer: Self-pay | Admitting: Internal Medicine

## 2013-08-27 NOTE — Assessment & Plan Note (Signed)
Goal achieved 

## 2013-08-27 NOTE — Assessment & Plan Note (Signed)
His blood sugars are well controlled 

## 2013-08-27 NOTE — Assessment & Plan Note (Signed)
His CXR is unchanged I have asked him to start using tudorza - I gave him samples and showed him how to use it and he demonstrated proficiency with its use

## 2013-08-27 NOTE — Assessment & Plan Note (Signed)
His blood pressure is well-controlled 

## 2013-08-27 NOTE — Assessment & Plan Note (Signed)
His CXR is neg for mass or PNA

## 2013-08-28 ENCOUNTER — Telehealth: Payer: Self-pay

## 2013-08-28 NOTE — Telephone Encounter (Signed)
The patient called and is needing Insulin samples, if available    Callback - [phone]

## 2013-08-28 NOTE — Telephone Encounter (Signed)
Left message on VM that samples ready for pick up

## 2013-09-23 ENCOUNTER — Telehealth: Payer: Self-pay | Admitting: *Deleted

## 2013-09-23 ENCOUNTER — Ambulatory Visit: Payer: BC Managed Care – PPO | Admitting: Podiatry

## 2013-09-23 NOTE — Telephone Encounter (Signed)
Patient phoned, could not understand his name (found him by dob and cb # from caller id) and the best I can understand is he's needing a refill on something.  Maybe since you're more familiar with him, you can understand him better than I can.  CB# [phone]

## 2013-09-24 NOTE — Telephone Encounter (Signed)
LMOVM advising to have pharmacy send request to our office per protocol/

## 2013-10-06 DIAGNOSIS — E119 Type 2 diabetes mellitus without complications: Secondary | ICD-10-CM | POA: Diagnosis not present

## 2013-10-06 DIAGNOSIS — H11009 Unspecified pterygium of unspecified eye: Secondary | ICD-10-CM | POA: Diagnosis not present

## 2013-10-06 DIAGNOSIS — H2510 Age-related nuclear cataract, unspecified eye: Secondary | ICD-10-CM | POA: Diagnosis not present

## 2013-10-06 DIAGNOSIS — H5200 Hypermetropia, unspecified eye: Secondary | ICD-10-CM | POA: Diagnosis not present

## 2013-10-06 LAB — HM DIABETES EYE EXAM

## 2013-10-26 ENCOUNTER — Other Ambulatory Visit: Payer: Self-pay | Admitting: Internal Medicine

## 2013-11-06 ENCOUNTER — Telehealth: Payer: Self-pay

## 2013-11-06 NOTE — Telephone Encounter (Signed)
LMOVM advising samples ready

## 2013-11-06 NOTE — Telephone Encounter (Signed)
The patient's wife is hoping to get the patient samples of insulin pen needles.    Thanks!

## 2013-11-09 ENCOUNTER — Other Ambulatory Visit: Payer: Self-pay | Admitting: Physician Assistant

## 2013-11-10 ENCOUNTER — Telehealth: Payer: Self-pay | Admitting: Internal Medicine

## 2013-11-10 DIAGNOSIS — E139 Other specified diabetes mellitus without complications: Secondary | ICD-10-CM

## 2013-11-10 MED ORDER — INSULIN DETEMIR 100 UNIT/ML FLEXPEN: 30.0000 [IU] | PEN_INJECTOR | Freq: Every day | SUBCUTANEOUS | Status: DC

## 2013-11-10 MED ORDER — INSULIN PEN NEEDLE 32G X 4 MM MISC: Status: DC

## 2013-11-10 NOTE — Telephone Encounter (Signed)
Patients wife would like script for needles called in to [facility] on [address].

## 2013-11-10 NOTE — Telephone Encounter (Signed)
Rx request sent to pharmacy.  

## 2013-11-12 ENCOUNTER — Other Ambulatory Visit: Payer: Self-pay

## 2013-11-12 MED ORDER — HYDROCHLOROTHIAZIDE 12.5 MG PO CAPS: ORAL_CAPSULE | ORAL | Status: DC

## 2013-11-13 ENCOUNTER — Ambulatory Visit: Payer: BC Managed Care – PPO | Admitting: Podiatry

## 2013-11-26 ENCOUNTER — Encounter: Payer: Self-pay | Admitting: Internal Medicine

## 2013-12-11 ENCOUNTER — Other Ambulatory Visit: Payer: Self-pay

## 2013-12-11 MED ORDER — HYDROCHLOROTHIAZIDE 12.5 MG PO CAPS: ORAL_CAPSULE | ORAL | Status: DC

## 2013-12-16 ENCOUNTER — Encounter: Payer: Self-pay | Admitting: Podiatry

## 2013-12-16 ENCOUNTER — Ambulatory Visit (INDEPENDENT_AMBULATORY_CARE_PROVIDER_SITE_OTHER): Payer: Medicare Other | Admitting: Podiatry

## 2013-12-16 VITALS — BP 130/59 | HR 71 | Resp 16

## 2013-12-16 DIAGNOSIS — B351 Tinea unguium: Secondary | ICD-10-CM

## 2013-12-16 DIAGNOSIS — M79609 Pain in unspecified limb: Secondary | ICD-10-CM | POA: Diagnosis not present

## 2013-12-16 DIAGNOSIS — E1149 Type 2 diabetes mellitus with other diabetic neurological complication: Secondary | ICD-10-CM | POA: Diagnosis not present

## 2013-12-16 DIAGNOSIS — Q828 Other specified congenital malformations of skin: Secondary | ICD-10-CM

## 2013-12-16 NOTE — Progress Notes (Signed)
He presents today with a chief complaint of painful elongated toenails one through 5 bilateral.  Objective: Pulses are palpable bilateral. Nails are thick yellow dystrophic with mycotic and painful palpation.  Assessment: Pain in limb secondary to onychomycosis 1 through 5 bilateral.  Plan: Debridement of nails 1 through 5 bilateral covered service secondary to pain.

## 2013-12-16 NOTE — Patient Instructions (Signed)
Diabetes and Foot Care Diabetes may cause you to have problems because of poor blood supply (circulation) to your feet and legs. This may cause the skin on your feet to become thinner, break easier, and heal more slowly. Your skin may become dry, and the skin may peel and crack. You may also have nerve damage in your legs and feet causing decreased feeling in them. You may not notice minor injuries to your feet that could lead to infections or more serious problems. Taking care of your feet is one of the most important things you can do for yourself.  HOME CARE INSTRUCTIONS  Wear shoes at all times, even in the house. Do not go barefoot. Bare feet are easily injured.  Check your feet daily for blisters, cuts, and redness. If you cannot see the bottom of your feet, use a mirror or ask someone for help.  Wash your feet with warm water (do not use hot water) and mild soap. Then pat your feet and the areas between your toes until they are completely dry. Do not soak your feet as this can dry your skin.  Apply a moisturizing lotion or petroleum jelly (that does not contain alcohol and is unscented) to the skin on your feet and to dry, brittle toenails. Do not apply lotion between your toes.  Trim your toenails straight across. Do not dig under them or around the cuticle. File the edges of your nails with an emery board or nail file.  Do not cut corns or calluses or try to remove them with medicine.  Wear clean socks or stockings every day. Make sure they are not too tight. Do not wear knee-high stockings since they may decrease blood flow to your legs.  Wear shoes that fit properly and have enough cushioning. To break in new shoes, wear them for just a few hours a day. This prevents you from injuring your feet. Always look in your shoes before you put them on to be sure there are no objects inside.  Do not cross your legs. This may decrease the blood flow to your feet.  If you find a minor scrape,  cut, or break in the skin on your feet, keep it and the skin around it clean and dry. These areas may be cleansed with mild soap and water. Do not cleanse the area with peroxide, alcohol, or iodine.  When you remove an adhesive bandage, be sure not to damage the skin around it.  If you have a wound, look at it several times a day to make sure it is healing.  Do not use heating pads or hot water bottles. They may burn your skin. If you have lost feeling in your feet or legs, you may not know it is happening until it is too late.  Make sure your health care provider performs a complete foot exam at least annually or more often if you have foot problems. Report any cuts, sores, or bruises to your health care provider immediately. SEEK MEDICAL CARE IF:   You have an injury that is not healing.  You have cuts or breaks in the skin.  You have an ingrown nail.  You notice redness on your legs or feet.  You feel burning or tingling in your legs or feet.  You have pain or cramps in your legs and feet.  Your legs or feet are numb.  Your feet always feel cold. SEEK IMMEDIATE MEDICAL CARE IF:   There is increasing redness,   swelling, or pain in or around a wound.  There is a red line that goes up your leg.  Pus is coming from a wound.  You develop a fever or as directed by your health care provider.  You notice a bad smell coming from an ulcer or wound. Document Released: 07/07/2000 Document Revised: 03/12/2013 Document Reviewed: 12/17/2012 Exit[employer] Patient Information 2014 [employer].  

## 2013-12-22 ENCOUNTER — Encounter: Payer: Self-pay | Admitting: Vascular Surgery

## 2013-12-23 ENCOUNTER — Ambulatory Visit (INDEPENDENT_AMBULATORY_CARE_PROVIDER_SITE_OTHER): Payer: Medicare Other | Admitting: Vascular Surgery

## 2013-12-23 ENCOUNTER — Ambulatory Visit (INDEPENDENT_AMBULATORY_CARE_PROVIDER_SITE_OTHER)
Admission: RE | Admit: 2013-12-23 | Discharge: 2013-12-23 | Disposition: A | Discharge status: Home / Self Care | Payer: Medicare Other | Source: Ambulatory Visit | Attending: Vascular Surgery | Admitting: Vascular Surgery

## 2013-12-23 ENCOUNTER — Encounter: Payer: Self-pay | Admitting: Vascular Surgery

## 2013-12-23 ENCOUNTER — Ambulatory Visit (HOSPITAL_COMMUNITY)
Admission: RE | Admit: 2013-12-23 | Discharge: 2013-12-23 | Disposition: A | Discharge status: Home / Self Care | Payer: Medicare Other | Source: Ambulatory Visit | Attending: Vascular Surgery | Admitting: Vascular Surgery

## 2013-12-23 ENCOUNTER — Other Ambulatory Visit: Payer: Self-pay

## 2013-12-23 VITALS — BP 158/69 | HR 56 | Ht 65.0 in | Wt 143.0 lb

## 2013-12-23 DIAGNOSIS — Z48812 Encounter for surgical aftercare following surgery on the circulatory system: Secondary | ICD-10-CM

## 2013-12-23 DIAGNOSIS — I739 Peripheral vascular disease, unspecified: Secondary | ICD-10-CM

## 2013-12-23 DIAGNOSIS — I10 Essential (primary) hypertension: Secondary | ICD-10-CM

## 2013-12-23 MED ORDER — LOSARTAN POTASSIUM 100 MG PO TABS: 100.0000 mg | ORAL_TABLET | Freq: Every day | ORAL | Status: DC

## 2013-12-23 NOTE — Addendum Note (Signed)
Addended by: Mena Goes on: 12/23/2013 05:02 PM   Modules accepted: Orders

## 2013-12-23 NOTE — Progress Notes (Signed)
Subjective:     Patient ID: Steven [name], male   DOB: August 20, 1945, 68 y.o.   MRN: [id number]  HPI this 68 year old male returns status post right femoral-popliteal saphenous vein graft in July of 2014 for severe claudication right leg. He is having no right leg claudication symptoms and has some mild left calf claudication symptoms but stable to ambulate up to a couple of blocks without difficulty. He has a history of rest pain or nonhealing ulcers or infection. He does take one aspirin per day.  Past Medical History  Diagnosis Date  . Coronary artery disease     Lexiscan Myoview 7/14:  Inf scar with minimal reversibility, inf HK, EF 45%;  Low risk  . Diabetes mellitus without complication   . Dyslipidemia   . Hypertension   . Peripheral vascular disease     a. s/p R fem-pop BPG 01/2013 (Dr. [name])  . Hernia   . Chest pain   . Heart murmur   . Allergy   . Hyperlipidemia   . Sickle cell anemia     has trait  . Irregular heartbeat   . Shortness of breath   . GERD (gastroesophageal reflux disease)     has used Zantac in the past     History  Substance Use Topics  . Smoking status: Former Smoker    Types: Cigarettes    Quit date: 02/04/2011  . Smokeless tobacco: Never Used     Comment: Quit tobacco 2 years ago.    . Alcohol Use: 1.8 oz/week    3 Cans of beer per week     Comment: everyday, beer 40 ozs & alcohol- 1-2 shots     Family History  Problem Relation Age of Onset  . Diabetes Mother   . Hypertension Mother   . Heart disease Mother   . Hyperlipidemia Mother   . Alcohol abuse Neg Hx   . Cancer Neg Hx   . Depression Neg Hx   . Early death Neg Hx   . Kidney disease Neg Hx   . Stroke Neg Hx   . Colon cancer Neg Hx   . Esophageal cancer Neg Hx   . Rectal cancer Neg Hx   . Stomach cancer Neg Hx   . Diabetes Father   . Heart disease Father   . Hyperlipidemia Father   . Hypertension Father   . Diabetes Sister   . Heart disease Sister   . Hyperlipidemia Sister    . Hypertension Sister   . Diabetes Brother   . Heart disease Brother   . Hyperlipidemia Brother   . Hypertension Brother   . Diabetes Son   . Heart disease Son   . Hyperlipidemia Son   . Hypertension Son     No Known Allergies  Current outpatient prescriptions:Aclidinium Bromide (TUDORZA PRESSAIR) 400 MCG/ACT AEPB, Inhale 1 puff into the lungs 2 (two) times daily., Disp: 1 each, Rfl: 11;  aspirin EC 81 MG tablet, Take 81 mg by mouth daily., Disp: , Rfl: ;  atorvastatin (LIPITOR) 20 MG tablet, TAKE 1 TABLET BY MOUTH DAILY BEFORE BREAKFAST, Disp: 30 tablet, Rfl: 0;  hydrochlorothiazide (MICROZIDE) 12.5 MG capsule, TAKE 2 CAPSULES BY MOUTH ONCE DAILY, Disp: 60 capsule, Rfl: 0 Insulin Detemir (LEVEMIR FLEXTOUCH) 100 UNIT/ML Pen, Inject 30 Units into the skin daily at 10 pm., Disp: 15 mL, Rfl: 11;  Insulin Pen Needle (BD PEN NEEDLE NANO U/F) 32G X 4 MM MISC, use as directed with INSULIN, Disp: 100 each, Rfl:  4;  losartan (COZAAR) 100 MG tablet, Take 1 tablet (100 mg total) by mouth daily before breakfast., Disp: 60 tablet, Rfl: 0 metFORMIN (GLUCOPHAGE) 1000 MG tablet, Take 1 tablet (1,000 mg total) by mouth 2 (two) times daily with a meal., Disp: 60 tablet, Rfl: 11;  metoprolol succinate (TOPROL-XL) 50 MG 24 hr tablet, TAKE 1 TABLET BY MOUTH DAILY WITH OR IMMEDIATELY FOLLOWING A MEAL, Disp: 90 tablet, Rfl: 3;  mometasone (NASONEX) 50 MCG/ACT nasal spray, Place 2 sprays into the nose daily., Disp: , Rfl:  tamsulosin (FLOMAX) 0.4 MG CAPS capsule, Take 1 capsule (0.4 mg total) by mouth daily after supper., Disp: 30 capsule, Rfl: 5  BP 158/69  Pulse 56  Ht 5\' 5"  (1.651 m)  Wt 143 lb (64.864 kg)  BMI 23.80 kg/m2  SpO2 100%  Body mass index is 23.8 kg/(m^2).           Review of Systems denies chest pain, dyspnea on exertion, PND, orthopnea, hemoptysis. Other systems negative and complete review of systems other than right hip pain due to arthritis    Objective:   Physical Exam BP 158/69   Pulse 56  Ht 5\' 5"  (1.651 m)  Wt 143 lb (64.864 kg)  BMI 23.80 kg/m2  SpO2 100%  Gen.-alert and oriented x3 in no apparent distress HEENT normal for age Lungs no rhonchi or wheezing Cardiovascular regular rhythm no murmurs carotid pulses 3+ palpable no bruits audible Abdomen soft nontender no palpable masses Musculoskeletal free of  major deformities Skin clear -no rashes Neurologic normal Lower extremities 3+ femoral and posterior tibial pulse palpable on the right. Left leg with 3+ femoral but absent popliteal and distal pulses. Both feet adequately perfused.  Today I ordered a duplex scan and ABIs of the right leg which are reviewed and interpreted. ABI in the right leg is unchanged at 0.91. There is evidence of some mild narrowing at the proximal and distal vein anastomoses and the femoral-popliteal graft on the right. 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        Assessment:     Nicely functioning right femoral popliteal saphenous vein graft placed in July of 2014 with resolution of claudication symptoms and ABI of 0.9 Possible proximal and distal anastomotic stenoses-mild    Plan:     Return in 6 months with duplex scan right femoral-popliteal vein graft and check ABIs Left leg is not severe enough to require femoral-popliteal grafting at this time

## 2014-01-16 ENCOUNTER — Other Ambulatory Visit: Payer: Self-pay

## 2014-01-16 MED ORDER — HYDROCHLOROTHIAZIDE 12.5 MG PO CAPS: ORAL_CAPSULE | ORAL | Status: DC

## 2014-01-28 ENCOUNTER — Encounter: Payer: Self-pay | Admitting: Internal Medicine

## 2014-01-28 ENCOUNTER — Ambulatory Visit (INDEPENDENT_AMBULATORY_CARE_PROVIDER_SITE_OTHER)
Admission: RE | Admit: 2014-01-28 | Discharge: 2014-01-28 | Disposition: A | Discharge status: Home / Self Care | Payer: Medicare Other | Source: Ambulatory Visit | Attending: Internal Medicine | Admitting: Internal Medicine

## 2014-01-28 ENCOUNTER — Other Ambulatory Visit (INDEPENDENT_AMBULATORY_CARE_PROVIDER_SITE_OTHER): Payer: Medicare Other

## 2014-01-28 ENCOUNTER — Ambulatory Visit (INDEPENDENT_AMBULATORY_CARE_PROVIDER_SITE_OTHER): Payer: Medicare Other | Admitting: Internal Medicine

## 2014-01-28 VITALS — BP 132/58 | HR 60 | Temp 98.2°F | Resp 16 | Ht 65.0 in | Wt 147.5 lb

## 2014-01-28 DIAGNOSIS — M25559 Pain in unspecified hip: Secondary | ICD-10-CM | POA: Diagnosis not present

## 2014-01-28 DIAGNOSIS — E119 Type 2 diabetes mellitus without complications: Secondary | ICD-10-CM

## 2014-01-28 DIAGNOSIS — M25551 Pain in right hip: Secondary | ICD-10-CM

## 2014-01-28 DIAGNOSIS — I10 Essential (primary) hypertension: Secondary | ICD-10-CM

## 2014-01-28 DIAGNOSIS — E139 Other specified diabetes mellitus without complications: Secondary | ICD-10-CM

## 2014-01-28 DIAGNOSIS — E785 Hyperlipidemia, unspecified: Secondary | ICD-10-CM

## 2014-01-28 LAB — CBC WITH DIFFERENTIAL/PLATELET
Basophils Absolute: 0 10*3/uL (ref 0.0–0.1)
Basophils Relative: 0.3 % (ref 0.0–3.0)
EOS PCT: 2.6 % (ref 0.0–5.0)
Eosinophils Absolute: 0.2 10*3/uL (ref 0.0–0.7)
HCT: 40.3 % (ref 39.0–52.0)
Hemoglobin: 13.2 g/dL (ref 13.0–17.0)
LYMPHS ABS: 2.5 10*3/uL (ref 0.7–4.0)
LYMPHS PCT: 32.3 % (ref 12.0–46.0)
MCHC: 32.8 g/dL (ref 30.0–36.0)
MCV: 85.2 fl (ref 78.0–100.0)
MONOS PCT: 8 % (ref 3.0–12.0)
Monocytes Absolute: 0.6 10*3/uL (ref 0.1–1.0)
Neutro Abs: 4.4 10*3/uL (ref 1.4–7.7)
Neutrophils Relative %: 56.8 % (ref 43.0–77.0)
PLATELETS: 176 10*3/uL (ref 150.0–400.0)
RBC: 4.73 Mil/uL (ref 4.22–5.81)
RDW: 15.9 % — ABNORMAL HIGH (ref 11.5–15.5)
WBC: 7.8 10*3/uL (ref 4.0–10.5)

## 2014-01-28 LAB — COMPREHENSIVE METABOLIC PANEL
ALBUMIN: 3.9 g/dL (ref 3.5–5.2)
ALT: 13 U/L (ref 0–53)
AST: 23 U/L (ref 0–37)
Alkaline Phosphatase: 67 U/L (ref 39–117)
BILIRUBIN TOTAL: 0.6 mg/dL (ref 0.2–1.2)
BUN: 21 mg/dL (ref 6–23)
CHLORIDE: 101 meq/L (ref 96–112)
CO2: 28 meq/L (ref 19–32)
Calcium: 10.1 mg/dL (ref 8.4–10.5)
Creatinine, Ser: 2.1 mg/dL — ABNORMAL HIGH (ref 0.4–1.5)
GFR: 39.98 mL/min — AB (ref >60.00)
Glucose, Bld: 117 mg/dL — ABNORMAL HIGH (ref 70–99)
POTASSIUM: 3.5 meq/L (ref 3.5–5.1)
SODIUM: 137 meq/L (ref 135–145)
TOTAL PROTEIN: 7.3 g/dL (ref 6.0–8.3)

## 2014-01-28 LAB — CK: CK TOTAL: 290 U/L — AB (ref 7–232)

## 2014-01-28 LAB — HEMOGLOBIN A1C: Hgb A1c MFr Bld: 6.5 % (ref 4.6–6.5)

## 2014-01-28 MED ORDER — TRAMADOL HCL 50 MG PO TABS
50.0000 mg | ORAL_TABLET | Freq: Three times a day (TID) | ORAL | Status: DC | PRN
Start: 2014-01-28 — End: 2020-01-09

## 2014-01-28 NOTE — Patient Instructions (Signed)
Type 2 Diabetes Mellitus, Adult Type 2 diabetes mellitus, often simply referred to as type 2 diabetes, is a long-lasting (chronic) disease. In type 2 diabetes, the pancreas does not make enough insulin (a hormone), the cells are less responsive to the insulin that is made (insulin resistance), or both. Normally, insulin moves sugars from food into the tissue cells. The tissue cells use the sugars for energy. The lack of insulin or the lack of normal response to insulin causes excess sugars to build up in the blood instead of going into the tissue cells. As a result, high blood sugar (hyperglycemia) develops. The effect of high sugar (glucose) levels can cause many complications. Type 2 diabetes was also previously called adult-onset diabetes but it can occur at any age.  RISK FACTORS  A person is predisposed to developing type 2 diabetes if someone in the family has the disease and also has one or more of the following primary risk factors:  Overweight.  An inactive lifestyle.  A history of consistently eating high-calorie foods. Maintaining a normal weight and regular physical activity can reduce the chance of developing type 2 diabetes. SYMPTOMS  A person with type 2 diabetes may not show symptoms initially. The symptoms of type 2 diabetes appear slowly. The symptoms include:  Increased thirst (polydipsia).  Increased urination (polyuria).  Increased urination during the night (nocturia).  Weight loss. This weight loss may be rapid.  Frequent, recurring infections.  Tiredness (fatigue).  Weakness.  Vision changes, such as blurred vision.  Fruity smell to your breath.  Abdominal pain.  Nausea or vomiting.  Cuts or bruises which are slow to heal.  Tingling or numbness in the hands or feet. DIAGNOSIS Type 2 diabetes is frequently not diagnosed until complications of diabetes are present. Type 2 diabetes is diagnosed when symptoms or complications are present and when blood  glucose levels are increased. Your blood glucose level may be checked by one or more of the following blood tests:  A fasting blood glucose test. You will not be allowed to eat for at least 8 hours before a blood sample is taken.  A random blood glucose test. Your blood glucose is checked at any time of the day regardless of when you ate.  A hemoglobin A1c blood glucose test. A hemoglobin A1c test provides information about blood glucose control over the previous 3 months.  An oral glucose tolerance test (OGTT). Your blood glucose is measured after you have not eaten (fasted) for 2 hours and then after you drink a glucose-containing beverage. TREATMENT   You may need to take insulin or diabetes medicine daily to keep blood glucose levels in the desired range.  If you use insulin, you may need to adjust the dosage depending on the carbohydrates that you eat with each meal or snack. The treatment goal is to maintain the before meal blood sugar (preprandial glucose) level at 70-130 mg/dL. HOME CARE INSTRUCTIONS   Have your hemoglobin A1c level checked twice a year.  Perform daily blood glucose monitoring as directed by your health care provider.  Monitor urine ketones when you are ill and as directed by your health care provider.  Take your diabetes medicine or insulin as directed by your health care provider to maintain your blood glucose levels in the desired range.  Never run out of diabetes medicine or insulin. It is needed every day.  If you are using insulin, you may need to adjust the amount of insulin given based on your intake   of carbohydrates. Carbohydrates can raise blood glucose levels but need to be included in your diet. Carbohydrates provide vitamins, minerals, and fiber which are an essential part of a healthy diet. Carbohydrates are found in fruits, vegetables, whole grains, dairy products, legumes, and foods containing added sugars.  Eat healthy foods. You should make an  appointment to see a registered dietitian to help you create an eating plan that is right for you.  Lose weight if overweight.  Carry a medical alert card or wear your medical alert jewelry.  Carry a 15 gram carbohydrate snack with you at all times to treat low blood glucose (hypoglycemia). Some examples of 15 gram carbohydrate snacks include:  Glucose tablets, 3 or 4  Raisins, 2 tablespoons (24 grams)  Jelly beans, 6  Animal crackers, 8  Regular pop, 4 ounces (120 mL)  Gummy treats, 9  Recognize hypoglycemia. Hypoglycemia occurs with blood glucose levels of 70 mg/dL and below. The risk for hypoglycemia increases when fasting or skipping meals, during or after intense exercise, and during sleep. Hypoglycemia symptoms can include:  Tremors or shakes.  Decreased ability to concentrate.  Sweating.  Increased heart rate.  Headache.  Dry mouth.  Hunger.  Irritability.  Anxiety.  Restless sleep.  Altered speech or coordination.  Confusion.  Treat hypoglycemia promptly. If you are alert and able to safely swallow, follow the 15:15 rule:  Take 15-20 grams of rapid-acting glucose or carbohydrate. Rapid-acting options include glucose gel, glucose tablets, or 4 ounces (120 mL) of fruit juice, regular soda, or low fat milk.  Check your blood glucose level 15 minutes after taking the glucose.  Take 15-20 grams more of glucose if the repeat blood glucose level is still 70 mg/dL or below.  Eat a meal or snack within 1 hour once blood glucose levels return to normal.  Be alert to feeling very thirsty and urinating more frequently than usual, which are early signs of hyperglycemia. An early awareness of hyperglycemia allows for prompt treatment. Treat hyperglycemia as directed by your health care provider.  Engage in at least 150 minutes of moderate-intensity physical activity a week, spread over at least 3 days of the week or as directed by your health care provider. In  addition, you should engage in resistance exercise at least 2 times a week or as directed by your health care provider.  Adjust your medicine and food intake as needed if you start a new exercise or sport.  Follow your sick day plan at any time you are unable to eat or drink as usual.  Avoid tobacco use.  Limit alcohol intake to no more than 1 drink per day for nonpregnant women and 2 drinks per day for men. You should drink alcohol only when you are also eating food. Talk with your health care provider whether alcohol is safe for you. Tell your health care provider if you drink alcohol several times a week.  Follow up with your health care provider regularly.  Schedule an eye exam soon after the diagnosis of type 2 diabetes and then annually.  Perform daily skin and foot care. Examine your skin and feet daily for cuts, bruises, redness, nail problems, bleeding, blisters, or sores. A foot exam by a health care provider should be done annually.  Brush your teeth and gums at least twice a day and floss at least once a day. Follow up with your dentist regularly.  Share your diabetes management plan with your workplace or school.  Stay up-to-date with   immunizations.  Learn to manage stress.  Obtain ongoing diabetes education and support as needed.  Participate in, or seek rehabilitation as needed to maintain or improve independence and quality of life. Request a physical or occupational therapy referral if you are having foot or hand numbness or difficulties with grooming, dressing, eating, or physical activity. SEEK MEDICAL CARE IF:   You are unable to eat food or drink fluids for more than 6 hours.  You have nausea and vomiting for more than 6 hours.  Your blood glucose level is over 240 mg/dL.  There is a change in mental status.  You develop an additional serious illness.  You have diarrhea for more than 6 hours.  You have been sick or have had a fever for a couple of days  and are not getting better.  You have pain during any physical activity.  SEEK IMMEDIATE MEDICAL CARE IF:  You have difficulty breathing.  You have moderate to large ketone levels. MAKE SURE YOU:  Understand these instructions.  Will watch your condition.  Will get help right away if you are not doing well or get worse. Document Released: 07/10/2005 Document Revised: 07/15/2013 Document Reviewed: 02/06/2012 [employer] Patient Information 2015 [employer]. This information is not intended to replace advice given to you by your health care provider. Make sure you discuss any questions you have with your health care provider.  

## 2014-01-28 NOTE — Progress Notes (Signed)
Subjective:    Patient ID: Steven [name], male    DOB: 1946/06/24, 68 y.o.   MRN: [id number]  Arthritis Presents for follow-up visit. He complains of pain. He reports no stiffness, joint swelling or joint warmth. Affected locations include the right shoulder and right hip. His pain is at a severity of 2/10. Associated symptoms include pain at night and pain while resting. Pertinent negatives include no diarrhea, dry eyes, dry mouth, dysuria, fatigue, fever, rash, Raynaud's syndrome, uveitis or weight loss. His past medical history is significant for osteoarthritis. Past treatments include nothing. The treatment provided no relief. Factors aggravating his arthritis include activity.      Review of Systems  Constitutional: Negative.  Negative for fever, chills, weight loss, diaphoresis, appetite change and fatigue.  HENT: Negative.   Eyes: Negative.   Respiratory: Negative.  Negative for cough, choking, chest tightness, shortness of breath, wheezing and stridor.   Cardiovascular: Negative.  Negative for chest pain, palpitations and leg swelling.  Gastrointestinal: Negative.  Negative for nausea, vomiting, abdominal pain, diarrhea, constipation and blood in stool.  Endocrine: Negative.   Genitourinary: Negative.  Negative for dysuria.  Musculoskeletal: Positive for arthralgias and arthritis. Negative for back pain, gait problem, joint swelling, myalgias, neck pain, neck stiffness and stiffness.  Skin: Negative.  Negative for rash.  Allergic/Immunologic: Negative.   Neurological: Negative.  Negative for dizziness, syncope, speech difficulty, weakness, light-headedness, numbness and headaches.  Hematological: Negative.  Negative for adenopathy. Does not bruise/bleed easily.  Psychiatric/Behavioral: Negative.        Objective:   Physical Exam  Vitals reviewed. Constitutional: He is oriented to person, place, and time. He appears well-developed and well-nourished. No distress.  HENT:    Head: Normocephalic and atraumatic.  Mouth/Throat: Oropharynx is clear and moist. No oropharyngeal exudate.  Eyes: Conjunctivae are normal. Right eye exhibits no discharge. Left eye exhibits no discharge. No scleral icterus.  Neck: Normal range of motion. Neck supple. No JVD present. No tracheal deviation present. No thyromegaly present.  Cardiovascular: Normal rate, regular rhythm, normal heart sounds and intact distal pulses.  Exam reveals no gallop and no friction rub.   No murmur heard. Pulmonary/Chest: Effort normal and breath sounds normal. No stridor. No respiratory distress. He has no wheezes. He has no rales. He exhibits no tenderness.  Abdominal: Soft. Bowel sounds are normal. He exhibits no distension and no mass. There is no tenderness. There is no rebound and no guarding.  Musculoskeletal: Normal range of motion. He exhibits no edema and no tenderness.  Lymphadenopathy:    He has no cervical adenopathy.  Neurological: He is oriented to person, place, and time.  Skin: Skin is warm and dry. No rash noted. He is not diaphoretic. No erythema. No pallor.     Lab Results  Component Value Date   WBC 7.6 08/25/2013   HGB 13.5 08/25/2013   HCT 42.2 08/25/2013   PLT 183.0 08/25/2013   GLUCOSE 95 08/25/2013   CHOL 157 08/25/2013   TRIG 111.0 08/25/2013   HDL 53.80 08/25/2013   LDLCALC 81 08/25/2013   ALT 18 02/03/2013   AST 22 02/03/2013   NA 140 08/25/2013   K 3.9 08/25/2013   CL 104 08/25/2013   CREATININE 1.3 08/25/2013   BUN 17 08/25/2013   CO2 28 08/25/2013   TSH 1.82 08/25/2013   PSA 3.27 08/08/2012   INR 1.07 02/03/2013   HGBA1C 6.4 08/25/2013   MICROALBUR 5.2* 08/08/2012       Assessment &  Plan:

## 2014-01-28 NOTE — Progress Notes (Signed)
Pre visit review using our clinic review tool, if applicable. No additional management support is needed unless otherwise documented below in the visit note. 

## 2014-01-29 ENCOUNTER — Encounter: Payer: Self-pay | Admitting: Internal Medicine

## 2014-01-30 ENCOUNTER — Encounter: Payer: Self-pay | Admitting: Internal Medicine

## 2014-01-30 NOTE — Assessment & Plan Note (Signed)
His blood sugars are well controlled 

## 2014-01-30 NOTE — Assessment & Plan Note (Signed)
His BP is well controlled I will monitor his lytes and renal fucntion

## 2014-01-30 NOTE — Assessment & Plan Note (Signed)
Will hold the statin for now to see if his pain improves

## 2014-01-30 NOTE — Assessment & Plan Note (Signed)
His plain film is normal He will try tramadol for pain

## 2014-02-21 ENCOUNTER — Other Ambulatory Visit: Payer: Self-pay | Admitting: Internal Medicine

## 2014-02-25 ENCOUNTER — Ambulatory Visit: Payer: Medicare Other | Admitting: Cardiology

## 2014-03-02 ENCOUNTER — Other Ambulatory Visit: Payer: Self-pay

## 2014-03-02 DIAGNOSIS — I10 Essential (primary) hypertension: Secondary | ICD-10-CM

## 2014-03-02 MED ORDER — LOSARTAN POTASSIUM 100 MG PO TABS: 100.0000 mg | ORAL_TABLET | Freq: Every day | ORAL | Status: DC

## 2014-03-03 ENCOUNTER — Ambulatory Visit (INDEPENDENT_AMBULATORY_CARE_PROVIDER_SITE_OTHER): Payer: Medicare Other | Admitting: Cardiology

## 2014-03-03 ENCOUNTER — Encounter: Payer: Self-pay | Admitting: Cardiology

## 2014-03-03 VITALS — BP 132/70 | HR 55 | Ht 65.0 in | Wt 142.5 lb

## 2014-03-03 DIAGNOSIS — I739 Peripheral vascular disease, unspecified: Secondary | ICD-10-CM | POA: Diagnosis not present

## 2014-03-03 DIAGNOSIS — I2510 Atherosclerotic heart disease of native coronary artery without angina pectoris: Secondary | ICD-10-CM | POA: Diagnosis not present

## 2014-03-03 DIAGNOSIS — I10 Essential (primary) hypertension: Secondary | ICD-10-CM

## 2014-03-03 NOTE — Progress Notes (Signed)
HPI The patient presents for follow up of coronary disease with previous stent when he was living in [place].   He seen last July by Richardson Dopp PAc prior to fem pop bypass.  He had a stress perfusion study which demonstrated an EF of 45% with inferior scar. He had his surgery and did well with this. He's had some hip pain in shoulder pain since then. However, this is thought to be arthritis. He has not had any chest pressure, neck or arm discomfort. He's not had any new shortness of breath, PND or orthopnea. He's had no new palpitations, presyncope or syncope. He does do activities and still works.  No Known Allergies  Current Outpatient Prescriptions  Medication Sig Dispense Refill  . Aclidinium Bromide (TUDORZA PRESSAIR) 400 MCG/ACT AEPB Inhale 1 puff into the lungs 2 (two) times daily.  1 each  11  . amoxicillin (AMOXIL) 500 MG capsule For Dental use only      . aspirin EC 81 MG tablet Take 81 mg by mouth daily.      . hydrochlorothiazide (MICROZIDE) 12.5 MG capsule TAKE 2 CAPSULES BY MOUTH ONCE DAILY  60 capsule  1  . Insulin Detemir (LEVEMIR FLEXTOUCH) 100 UNIT/ML Pen Inject 30 Units into the skin daily at 10 pm.  15 mL  11  . Insulin Pen Needle (BD PEN NEEDLE NANO U/F) 32G X 4 MM MISC use as directed with INSULIN  100 each  4  . losartan (COZAAR) 100 MG tablet Take 1 tablet (100 mg total) by mouth daily before breakfast.  60 tablet  3  . metFORMIN (GLUCOPHAGE) 1000 MG tablet TAKE 1 TABLET BY MOUTH TWICE DAILY WITH A MEAL  180 tablet  1  . metoprolol succinate (TOPROL-XL) 50 MG 24 hr tablet TAKE 1 TABLET BY MOUTH DAILY WITH OR IMMEDIATELY FOLLOWING A MEAL  90 tablet  3  . mometasone (NASONEX) 50 MCG/ACT nasal spray Place 2 sprays into the nose daily.      . tamsulosin (FLOMAX) 0.4 MG CAPS capsule Take 1 capsule (0.4 mg total) by mouth daily after supper.  30 capsule  5  . traMADol (ULTRAM) 50 MG tablet Take 1 tablet (50 mg total) by mouth every 8 (eight) hours as needed.  65 tablet  2     No current facility-administered medications for this visit.    Past Medical History  Diagnosis Date  . Coronary artery disease     Lexiscan Myoview 7/14:  Inf scar with minimal reversibility, inf HK, EF 45%;  Low risk.  Promus LAD stent MI.  100% RCA  . Diabetes mellitus without complication   . Dyslipidemia   . Hypertension   . Peripheral vascular disease     a. s/p R fem-pop BPG 01/2013 (Dr. [name])  . Hernia   . Allergy   . Hyperlipidemia   . Sickle cell anemia     has trait  . GERD (gastroesophageal reflux disease)     has used Zantac in the past     Past Surgical History  Procedure Laterality Date  . Cardiac catheterization      with stent placement  . Inguinal hernia repair      Right  . Colonoscopy  March 2014  . Femoral-popliteal bypass graft Right 02/07/2013    Procedure: BYPASS GRAFT FEMORAL-POPLITEAL ARTERY- RIGHT;  Surgeon: Mal Misty, MD;  Location: [facility] OR;  Service: Vascular;  Laterality: Right;  Right femoral to below knee popliteal bypass using right non reversed  great saphenous vein  . Intraoperative arteriogram Right 02/07/2013    Procedure: Attempted INTRA OPERATIVE ARTERIOGRAM;  Surgeon: Mal Misty, MD;  Location: [facility];  Service: Vascular;  Laterality: Right;    ROS:  As stated in the HPI and negative for all other systems.   PHYSICAL EXAM BP 132/70  Pulse 55  Ht 5\' 5"  (1.651 m)  Wt 142 lb 8 oz (64.638 kg)  BMI 23.71 kg/m2 GENERAL:  Well appearing NECK:  No jugular venous distention, waveform within normal limits, carotid upstroke brisk and symmetric, no bruits, no thyromegaly LUNGS:  Clear to auscultation bilaterally BACK:  No CVA tenderness CHEST:  Unremarkable HEART:  PMI not displaced or sustained,S1 and S2 within normal limits, no S3, no S4, no clicks, no rubs, no murmurs ABD:  Flat, positive bowel sounds normal in frequency in pitch, no bruits, no rebound, no guarding, no midline pulsatile mass, no hepatomegaly, no  splenomegaly EXT:  2 plus pulses upper, diminished dorsalis pedis and posterior tibialis bilaterally, no edema, no cyanosis no clubbing  EKG:  Sinus bradycardia, rate 55, axis within normal limits, old inferior infarct, nonspecific lateral T-wave changes.  03/03/2014   ASSESSMENT AND PLAN  CAD  The patient is not having active symptoms.  He will continue with aggressive risk reduction.   Dyslipidemia His recent lipid profile in July was excellent with an LDL of 81 and HDL of 54. He will continue the meds as listed.  HTN The blood pressure is at target. No change in medications is indicated. We will continue with therapeutic lifestyle changes (TLC).  CKD I do not see creatinine was 2.1. I will defer to Scarlette Calico, MD  DM His hemoglobin A1c was 6.5. He will continue on the meds as listed.he says he is having some financial difficulty with the insulin but I encouraged him to continue with this as his risk factors are all nicely addressed.

## 2014-03-03 NOTE — Patient Instructions (Signed)
Your physician recommends that you schedule a follow-up appointment in: one year with Dr. Hochrein  

## 2014-03-16 ENCOUNTER — Other Ambulatory Visit: Payer: Self-pay | Admitting: *Deleted

## 2014-03-16 MED ORDER — HYDROCHLOROTHIAZIDE 12.5 MG PO CAPS: ORAL_CAPSULE | ORAL | Status: DC

## 2014-04-14 DIAGNOSIS — L608 Other nail disorders: Secondary | ICD-10-CM | POA: Diagnosis not present

## 2014-04-14 DIAGNOSIS — L84 Corns and callosities: Secondary | ICD-10-CM | POA: Diagnosis not present

## 2014-04-14 DIAGNOSIS — E1059 Type 1 diabetes mellitus with other circulatory complications: Secondary | ICD-10-CM | POA: Diagnosis not present

## 2014-04-14 DIAGNOSIS — I739 Peripheral vascular disease, unspecified: Secondary | ICD-10-CM | POA: Diagnosis not present

## 2014-04-14 DIAGNOSIS — B351 Tinea unguium: Secondary | ICD-10-CM | POA: Diagnosis not present

## 2014-04-14 DIAGNOSIS — M79609 Pain in unspecified limb: Secondary | ICD-10-CM | POA: Diagnosis not present

## 2014-04-16 ENCOUNTER — Ambulatory Visit: Payer: BC Managed Care – PPO | Admitting: Podiatry

## 2014-04-20 ENCOUNTER — Encounter (HOSPITAL_COMMUNITY): Payer: Self-pay | Admitting: Emergency Medicine

## 2014-04-20 ENCOUNTER — Emergency Department (HOSPITAL_COMMUNITY)
Admission: EM | Admit: 2014-04-20 | Discharge: 2014-04-21 | Disposition: A | Discharge status: Home / Self Care | Payer: Worker's Compensation | Attending: Emergency Medicine | Admitting: Emergency Medicine

## 2014-04-20 DIAGNOSIS — IMO0002 Reserved for concepts with insufficient information to code with codable children: Secondary | ICD-10-CM | POA: Diagnosis present

## 2014-04-20 DIAGNOSIS — Z8719 Personal history of other diseases of the digestive system: Secondary | ICD-10-CM | POA: Insufficient documentation

## 2014-04-20 DIAGNOSIS — Z9889 Other specified postprocedural states: Secondary | ICD-10-CM | POA: Diagnosis not present

## 2014-04-20 DIAGNOSIS — Z862 Personal history of diseases of the blood and blood-forming organs and certain disorders involving the immune mechanism: Secondary | ICD-10-CM | POA: Insufficient documentation

## 2014-04-20 DIAGNOSIS — Z87891 Personal history of nicotine dependence: Secondary | ICD-10-CM | POA: Diagnosis not present

## 2014-04-20 DIAGNOSIS — Y9389 Activity, other specified: Secondary | ICD-10-CM | POA: Insufficient documentation

## 2014-04-20 DIAGNOSIS — I10 Essential (primary) hypertension: Secondary | ICD-10-CM | POA: Diagnosis not present

## 2014-04-20 DIAGNOSIS — E119 Type 2 diabetes mellitus without complications: Secondary | ICD-10-CM | POA: Diagnosis not present

## 2014-04-20 DIAGNOSIS — Z79899 Other long term (current) drug therapy: Secondary | ICD-10-CM | POA: Diagnosis not present

## 2014-04-20 DIAGNOSIS — Z794 Long term (current) use of insulin: Secondary | ICD-10-CM | POA: Insufficient documentation

## 2014-04-20 DIAGNOSIS — Y929 Unspecified place or not applicable: Secondary | ICD-10-CM | POA: Diagnosis not present

## 2014-04-20 DIAGNOSIS — S81009A Unspecified open wound, unspecified knee, initial encounter: Secondary | ICD-10-CM | POA: Diagnosis not present

## 2014-04-20 DIAGNOSIS — W540XXA Bitten by dog, initial encounter: Secondary | ICD-10-CM | POA: Diagnosis not present

## 2014-04-20 DIAGNOSIS — I2510 Atherosclerotic heart disease of native coronary artery without angina pectoris: Secondary | ICD-10-CM | POA: Diagnosis not present

## 2014-04-20 DIAGNOSIS — Z7982 Long term (current) use of aspirin: Secondary | ICD-10-CM | POA: Diagnosis not present

## 2014-04-20 DIAGNOSIS — S81852A Open bite, left lower leg, initial encounter: Secondary | ICD-10-CM

## 2014-04-20 NOTE — ED Provider Notes (Signed)
CSN: [id number]     Arrival date & time 04/20/14  2022 History   First MD Initiated Contact with Patient 04/20/14 2151     Chief Complaint  Patient presents with  . Animal Bite     (Consider location/radiation/quality/duration/timing/severity/associated sxs/prior Treatment) HPI Comments: Patient was delivering when biten on the L lower leg through his pants now has single abrasion to medial L calfn  Patient is a 68 y.o. male presenting with animal bite. The history is provided by the patient.  Animal Bite Contact animal:  Dog Location:  Leg Leg injury location:  L lower leg Pain details:    Quality:  Aching   Severity:  Mild   Timing:  Constant   Progression:  Unchanged Incident location:  Another residence Provoked: unprovoked   Notifications:  Animal control Animal's rabies vaccination status:  Up to date Animal in possession: yes   Tetanus status:  Up to date Relieved by:  None tried Worsened by:  Nothing tried Ineffective treatments:  None tried Associated symptoms: no fever and no swelling     Past Medical History  Diagnosis Date  . Coronary artery disease     Lexiscan Myoview 7/14:  Inf scar with minimal reversibility, inf HK, EF 45%;  Low risk.  Promus LAD stent placed in [place].  100% RCA  . Diabetes mellitus without complication   . Dyslipidemia   . Hypertension   . Peripheral vascular disease     a. s/p R fem-pop BPG 01/2013 (Dr. [name])  . Hernia   . Allergy   . Hyperlipidemia   . Sickle cell anemia     has trait  . GERD (gastroesophageal reflux disease)     has used Zantac in the past    Past Surgical History  Procedure Laterality Date  . Cardiac catheterization      with stent placement  . Inguinal hernia repair      Right  . Colonoscopy  March 2014  . Femoral-popliteal bypass graft Right 02/07/2013    Procedure: BYPASS GRAFT FEMORAL-POPLITEAL ARTERY- RIGHT;  Surgeon: Mal Misty, MD;  Location: [facility] OR;  Service: Vascular;  Laterality: Right;   Right femoral to below knee popliteal bypass using right non reversed great saphenous vein  . Intraoperative arteriogram Right 02/07/2013    Procedure: Attempted INTRA OPERATIVE ARTERIOGRAM;  Surgeon: Mal Misty, MD;  Location: [facility] OR;  Service: Vascular;  Laterality: Right;   Family History  Problem Relation Age of Onset  . Diabetes Mother   . Hypertension Mother   . Heart disease Mother   . Hyperlipidemia Mother   . Alcohol abuse Neg Hx   . Cancer Neg Hx   . Depression Neg Hx   . Early death Neg Hx   . Kidney disease Neg Hx   . Stroke Neg Hx   . Colon cancer Neg Hx   . Esophageal cancer Neg Hx   . Rectal cancer Neg Hx   . Stomach cancer Neg Hx   . Diabetes Father   . Heart disease Father   . Hyperlipidemia Father   . Hypertension Father   . Diabetes Sister   . Heart disease Sister   . Hyperlipidemia Sister   . Hypertension Sister   . Diabetes Brother   . Heart disease Brother   . Hyperlipidemia Brother   . Hypertension Brother   . Diabetes Son   . Heart disease Son   . Hyperlipidemia Son   . Hypertension Son    History  Substance Use Topics  . Smoking status: Former Smoker    Types: Cigarettes    Quit date: 02/04/2011  . Smokeless tobacco: Never Used     Comment: Quit tobacco 2 years ago.    . Alcohol Use: 1.8 oz/week    3 Cans of beer per week     Comment: everyday, beer 40 ozs & alcohol- 1-2 shots     Review of Systems  Constitutional: Negative for fever.  Skin: Positive for wound.  All other systems reviewed and are negative.     Allergies  Review of patient's allergies indicates no known allergies.  Home Medications   Prior to Admission medications   Medication Sig Start Date End Date Taking? Authorizing Provider  Aclidinium Bromide (TUDORZA PRESSAIR) 400 MCG/ACT AEPB Inhale 1 puff into the lungs 2 (two) times daily. 08/25/13  Yes Janith Lima, MD  aspirin EC 81 MG tablet Take 81 mg by mouth daily.   Yes Historical Provider, MD    hydrochlorothiazide (MICROZIDE) 12.5 MG capsule Take 25 mg by mouth daily.   Yes Historical Provider, MD  Insulin Detemir (LEVEMIR FLEXTOUCH) 100 UNIT/ML Pen Inject 30 Units into the skin daily at 10 pm. 11/10/13  Yes Janith Lima, MD  losartan (COZAAR) 100 MG tablet Take 1 tablet (100 mg total) by mouth daily before breakfast. 03/02/14  Yes Minus Breeding, MD  metFORMIN (GLUCOPHAGE) 1000 MG tablet Take 1,000 mg by mouth 2 (two) times daily with a meal.   Yes Historical Provider, MD  mometasone (NASONEX) 50 MCG/ACT nasal spray Place 2 sprays into the nose daily. 11/29/12  Yes Janith Lima, MD  tamsulosin (FLOMAX) 0.4 MG CAPS capsule Take 1 capsule (0.4 mg total) by mouth daily after supper. 05/29/13  Yes Janith Lima, MD  terbinafine (LAMISIL) 250 MG tablet Take 250 mg by mouth daily.  04/14/14  Yes Historical Provider, MD  traMADol (ULTRAM) 50 MG tablet Take 1 tablet (50 mg total) by mouth every 8 (eight) hours as needed. 01/28/14  Yes Janith Lima, MD  Insulin Pen Needle (BD PEN NEEDLE NANO U/F) 32G X 4 MM MISC use as directed with INSULIN 11/10/13   Janith Lima, MD   BP 178/68  Pulse 50  Temp(Src) 97.8 F (36.6 C) (Oral)  Resp 18  Ht 5\' 5"  (1.651 m)  Wt 140 lb (63.504 kg)  BMI 23.30 kg/m2  SpO2 98% Physical Exam  Vitals reviewed. Constitutional: He appears well-developed and well-nourished.  HENT:  Head: Normocephalic.  Eyes: Pupils are equal, round, and reactive to light.  Cardiovascular: Normal rate.   Pulmonary/Chest: Effort normal.  Musculoskeletal: Normal range of motion. He exhibits tenderness. He exhibits no edema.       Legs: Neurological: He is alert.  Skin: Skin is warm.    ED Course  Procedures (including critical care time) Labs Review Labs Reviewed - No data to display  Imaging Review No results found.   EKG Interpretation None     Will report to animal control No need for Rabies immunization as there is no puncture, small superficail abrasion through  clothing and animal reportedly  immunization UTD MDM   Final diagnoses:  Animal bite of lower leg, left, initial encounter         Garald Balding, NP 04/21/14 0003

## 2014-04-20 NOTE — ED Notes (Signed)
Pt c/o dog bite to left lower leg. Pt does not know the dog, the dog's owner told the patient the dog's shots were up to date. No puncture wound noted, just a small abrasion. Tetanus given last year

## 2014-04-21 ENCOUNTER — Telehealth: Payer: Self-pay | Admitting: *Deleted

## 2014-04-21 NOTE — Discharge Instructions (Signed)
Animal Bite Animal bite wounds can get infected. It is important to get proper medical treatment. Ask your doctor if you need a rabies shot. HOME CARE   Follow your doctor's instructions for taking care of your wound.  Only take medicine as told by your doctor.  Take your medicine (antibiotics) as told. Finish them even if you start to feel better.  Keep all doctor visits as told. You may need a tetanus shot if:   You cannot remember when you had your last tetanus shot.  You have never had a tetanus shot.  The injury broke your skin. If you need a tetanus shot and you choose not to have one, you may get tetanus. Sickness from tetanus can be serious. GET HELP RIGHT AWAY IF:   Your wound is warm, red, sore, or puffy (swollen).  You notice yellowish-white fluid (pus) or a bad smell coming from the wound.  You see a red line on the skin coming from the wound.  You have a fever, chills, or you feel sick.  You feel sick to your stomach (nauseous), or you throw up (vomit).  Your pain does not go away, or it gets worse.  You have trouble moving the injured part.  You have questions or concerns. MAKE SURE YOU:   Understand these instructions.  Will watch your condition.  Will get help right away if you are not doing well or get worse. Document Released: 07/10/2005 Document Revised: 10/02/2011 Document Reviewed: 03/01/2011 [facility] Patient Information 2015 [place], [place]. This information is not intended to replace advice given to you by your health care provider. Make sure you discuss any questions you have with your health care provider. You can take tylenol or ibuprofen for discomfort

## 2014-04-21 NOTE — Telephone Encounter (Signed)
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 He has one 1/4 inch mark on the calf of leg from apparently the dogs teeth that bit him. No bleeding seen , but it "peeled the skin" . Pt spoke to dogs owner and he stated the dog has had rabies vaccine ,but showed no evidence of. RN reached See ED immediately for wound/cut/puncture /scratcg, or saliva contamination of an open wound or of mucous membrence by a domestic animal with symptoms of rabies or exhibiting unsual behavior per Bites - Animal or Human protocol . Care advice given and advised to proceed to [facility] ED and he agreed. Protocol(s) Used: Bites - Animal or Human Recommended Outcome per Protocol: See ED Immediately Reason for Outcome: Wound/cut/puncture/scratch, OR saliva contamination of an open wound or of mucous membrane, by a domestic animal with symptoms of rabies or exhibiting unusual behavior. Care Advice: Contact the local animal control agency if: - the animal was a wild animal, stray dog or cat; - the attack was unprovoked; - the bite was from an animal that was aggressive or looks sick, has not been vaccinated for rabies, or rabies vaccine status unknown. ~ 09/

## 2014-04-21 NOTE — ED Notes (Signed)
Patient discharged with all personal belongings. 

## 2014-04-22 NOTE — ED Provider Notes (Signed)
Medical screening examination/treatment/procedure(s) were performed by non-physician practitioner and as supervising physician I was immediately available for consultation/collaboration.   EKG Interpretation None       Babette Relic, MD 04/22/14 [phone]

## 2014-05-08 NOTE — Telephone Encounter (Signed)
No information was typed/tmj 

## 2014-05-25 DIAGNOSIS — G609 Hereditary and idiopathic neuropathy, unspecified: Secondary | ICD-10-CM | POA: Diagnosis not present

## 2014-05-25 DIAGNOSIS — M79669 Pain in unspecified lower leg: Secondary | ICD-10-CM | POA: Diagnosis not present

## 2014-05-25 DIAGNOSIS — B351 Tinea unguium: Secondary | ICD-10-CM | POA: Diagnosis not present

## 2014-05-25 DIAGNOSIS — M792 Neuralgia and neuritis, unspecified: Secondary | ICD-10-CM | POA: Diagnosis not present

## 2014-06-29 ENCOUNTER — Encounter: Payer: Self-pay | Admitting: Family

## 2014-06-30 ENCOUNTER — Ambulatory Visit (INDEPENDENT_AMBULATORY_CARE_PROVIDER_SITE_OTHER)
Admission: RE | Admit: 2014-06-30 | Discharge: 2014-06-30 | Disposition: A | Discharge status: Home / Self Care | Payer: Medicare Other | Source: Ambulatory Visit | Attending: Family | Admitting: Family

## 2014-06-30 ENCOUNTER — Ambulatory Visit (INDEPENDENT_AMBULATORY_CARE_PROVIDER_SITE_OTHER): Payer: Medicare Other | Admitting: Family

## 2014-06-30 ENCOUNTER — Encounter: Payer: Self-pay | Admitting: Family

## 2014-06-30 ENCOUNTER — Ambulatory Visit (HOSPITAL_COMMUNITY)
Admission: RE | Admit: 2014-06-30 | Discharge: 2014-06-30 | Disposition: A | Discharge status: Home / Self Care | Payer: Medicare Other | Source: Ambulatory Visit | Attending: Family | Admitting: Family

## 2014-06-30 VITALS — BP 143/66 | HR 52 | Temp 98.2°F | Resp 14 | Ht 65.0 in | Wt 140.0 lb

## 2014-06-30 DIAGNOSIS — I2510 Atherosclerotic heart disease of native coronary artery without angina pectoris: Secondary | ICD-10-CM | POA: Diagnosis not present

## 2014-06-30 DIAGNOSIS — M79605 Pain in left leg: Secondary | ICD-10-CM | POA: Insufficient documentation

## 2014-06-30 DIAGNOSIS — I739 Peripheral vascular disease, unspecified: Secondary | ICD-10-CM | POA: Diagnosis not present

## 2014-06-30 DIAGNOSIS — Z9889 Other specified postprocedural states: Secondary | ICD-10-CM

## 2014-06-30 DIAGNOSIS — Z95828 Presence of other vascular implants and grafts: Secondary | ICD-10-CM

## 2014-06-30 DIAGNOSIS — Z48812 Encounter for surgical aftercare following surgery on the circulatory system: Secondary | ICD-10-CM

## 2014-06-30 NOTE — Patient Instructions (Signed)

## 2014-06-30 NOTE — Progress Notes (Signed)
[facility] [place] HISTORY AND PHYSICAL -PAD  History of Present Illness Steven [name] is a 68 y.o. male patient of Dr. Kellie Simmering who returns status post right femoral-popliteal saphenous vein graft in July of 2014 for severe claudication right leg. He is having no right leg claudication symptoms. He denies claudication in his left leg with walking, denies non healing wounds. He does take one aspirin per day.  The patient denies New Medical or Surgical History. He has CAD, sees a cardiologist.  Pt Diabetic: Yes, states in control Pt smoker: former smoker, quit in 2012  Pt meds include: Statin :No., states he was taking a statin, no adverse reaction, states his medical provider stopped it. Betablocker: Yes ASA: Yes Other anticoagulants/antiplatelets: no  Past Medical History  Diagnosis Date  . Coronary artery disease     Lexiscan Myoview 7/14:  Inf scar with minimal reversibility, inf HK, EF 45%;  Low risk.  Promus LAD stent placed in [place].  100% RCA  . Diabetes mellitus without complication   . Dyslipidemia   . Hypertension   . Peripheral vascular disease     a. s/p R fem-pop BPG 01/2013 (Dr. [name])  . Hernia   . Allergy   . Hyperlipidemia   . Sickle cell anemia     has trait  . GERD (gastroesophageal reflux disease)     has used Zantac in the past     Social History History  Substance Use Topics  . Smoking status: Former Smoker    Types: Cigarettes    Quit date: 02/04/2011  . Smokeless tobacco: Never Used     Comment: Quit tobacco 2 years ago.    . Alcohol Use: 1.8 oz/week    3 Cans of beer per week     Comment: everyday, beer 40 ozs & alcohol- 1-2 shots     Family History Family History  Problem Relation Age of Onset  . Diabetes Mother   . Hypertension Mother   . Heart disease Mother   . Hyperlipidemia Mother   . Alcohol abuse Neg Hx   . Cancer Neg Hx   . Depression Neg Hx   . Early death Neg Hx   . Kidney disease Neg Hx   .  Stroke Neg Hx   . Colon cancer Neg Hx   . Esophageal cancer Neg Hx   . Rectal cancer Neg Hx   . Stomach cancer Neg Hx   . Diabetes Father   . Heart disease Father   . Hyperlipidemia Father   . Hypertension Father   . Diabetes Sister   . Heart disease Sister   . Hyperlipidemia Sister   . Hypertension Sister   . Diabetes Brother   . Heart disease Brother   . Hyperlipidemia Brother   . Hypertension Brother   . Diabetes Son   . Heart disease Son   . Hyperlipidemia Son   . Hypertension Son     Past Surgical History  Procedure Laterality Date  . Cardiac catheterization      with stent placement  . Inguinal hernia repair      Right  . Colonoscopy  March 2014  . Femoral-popliteal bypass graft Right 02/07/2013    Procedure: BYPASS GRAFT FEMORAL-POPLITEAL ARTERY- RIGHT;  Surgeon: Mal Misty, MD;  Location: [facility] OR;  Service: Vascular;  Laterality: Right;  Right femoral to below knee popliteal bypass using right non reversed great saphenous vein  . Intraoperative arteriogram Right 02/07/2013    Procedure:  Attempted INTRA OPERATIVE ARTERIOGRAM;  Surgeon: Mal Misty, MD;  Location: [facility] OR;  Service: Vascular;  Laterality: Right;    No Known Allergies  Current Outpatient Prescriptions  Medication Sig Dispense Refill  . Aclidinium Bromide (TUDORZA PRESSAIR) 400 MCG/ACT AEPB Inhale 1 puff into the lungs 2 (two) times daily. 1 each 11  . aspirin EC 81 MG tablet Take 81 mg by mouth daily.    . hydrochlorothiazide (MICROZIDE) 12.5 MG capsule Take 25 mg by mouth daily.    . Insulin Detemir (LEVEMIR FLEXTOUCH) 100 UNIT/ML Pen Inject 30 Units into the skin daily at 10 pm. 15 mL 11  . Insulin Pen Needle (BD PEN NEEDLE NANO U/F) 32G X 4 MM MISC use as directed with INSULIN 100 each 4  . losartan (COZAAR) 100 MG tablet Take 1 tablet (100 mg total) by mouth daily before breakfast. 60 tablet 3  . metFORMIN (GLUCOPHAGE) 1000 MG tablet Take 1,000 mg by mouth 2 (two) times daily with a meal.    .  metoprolol succinate (TOPROL-XL) 50 MG 24 hr tablet Take by mouth daily.  3  . mometasone (NASONEX) 50 MCG/ACT nasal spray Place 2 sprays into the nose daily.    . tamsulosin (FLOMAX) 0.4 MG CAPS capsule Take 1 capsule (0.4 mg total) by mouth daily after supper. 30 capsule 5  . terbinafine (LAMISIL) 250 MG tablet Take 250 mg by mouth daily.     . traMADol (ULTRAM) 50 MG tablet Take 1 tablet (50 mg total) by mouth every 8 (eight) hours as needed. 65 tablet 2   No current facility-administered medications for this visit.    ROS: See HPI for pertinent positives and negatives.   Physical Examination  Filed Vitals:   06/30/14 1649  BP: 143/66  Pulse: 52  Temp: 98.2 F (36.8 C)  TempSrc: Oral  Resp: 14  Height: 5\' 5"  (1.651 m)  Weight: 140 lb (63.504 kg)  SpO2: 97%   Body mass index is 23.3 kg/(m^2).  General: A&O x 3, WDWN. Gait: normal Eyes: PERRLA. Pulmonary: CTAB, without wheezes , rales or rhonchi. Cardiac: regular Rythm , without detected murmur.         Carotid Bruits Right Left   Negative Negative  Aorta is not palpable. Radial pulses: are 1+ palpable and =                           VASCULAR EXAM: Extremities without ischemic changes  without Gangrene; without open wounds.                                                                                                          LE Pulses Right Left       FEMORAL  2+ palpable  1+ palpable        POPLITEAL  not palpable   not palpable       POSTERIOR TIBIAL  2+ palpable   not palpable        DORSALIS PEDIS      ANTERIOR  TIBIAL not palpable  not palpable    Abdomen: soft, NT, no masses. Skin: no rashes, no ulcers noted. Musculoskeletal: no muscle wasting or atrophy.  Neurologic: A&O X 3; Appropriate Affect ; SENSATION: normal; MOTOR FUNCTION:  moving all extremities equally, motor strength 5/5 throughout. Speech is fluent/normal. CN 2-12 intact.    Non-Invasive Vascular Imaging: DATE: 06/30/2014 LOWER  EXTREMITY ARTERIAL DUPLEX EVALUATION    INDICATION: Follow-up right lower extremity femoropopliteal arterial bypass graft     PREVIOUS INTERVENTION(S): Right femoropopliteal arterial bypass graft placed 02/07/2013    DUPLEX EXAM:     RIGHT  LEFT   Peak Systolic Velocity (cm/s) Ratio (if abnormal) Waveform  Peak Systolic Velocity (cm/s) Ratio (if abnormal) Waveform  200  T Inflow Artery     288  T Proximal Anastomosis     96  T Proximal Graft     70  T Mid Graft     82  T  Distal Graft     88  T Distal Anastomosis     200 2.3 T Outflow Artery     1.02 Today's ABI / TBI 0.60  0.91 Previous ABI / TBI (12/23/2013 ) 0.59    Waveform:    M - Monophasic       B - Biphasic       T - Triphasic  If Ankle Brachial Index (ABI) or Toe Brachial Index (TBI) performed, please see complete report  ADDITIONAL FINDINGS:     IMPRESSION: 1. Patent right femoropopliteal arterial bypass graft with diffuse disease observed at the native artery inflow suggestive of >50%. 2. There is a significant (>50%) focal stenosis in the immediate native artery outflow, approximately 1.6 cm beyond the anastomosis.    Compared to the previous exam:  No significant change compared to prior exam.      ASSESSMENT: Steven [name] is a 68 y.o. male who is status post right femoral-popliteal saphenous vein graft in July of 2014 for severe claudication right leg. He is having no right leg claudication symptoms. He denies claudication in his left leg with walking, he has no tissue loss. He walks a great deal as part of his job. Today's right LE arterial Duplex reveals a patent right femoropopliteal arterial bypass graft with diffuse disease observed at the native artery inflow suggestive of >50% and a a significant (>50%) focal stenosis in the immediate native artery outflow, approximately 1.6 cm beyond the anastomosis. No significant change compared to prior exam.     PLAN:  I discussed in depth with the patient the  nature of atherosclerosis, and emphasized the importance of maximal medical management including strict control of blood pressure, blood glucose, and lipid levels, obtaining regular exercise, and continued cessation of smoking.  The patient is aware that without maximal medical management the underlying atherosclerotic disease process will progress, limiting the benefit of any interventions.  Based on the patient's vascular studies and examination, pt will return to clinic in 6 months for ABI's and right LE arterial Duplex.   The patient was given information about PAD including signs, symptoms, treatment, what symptoms should prompt the patient to seek immediate medical care, and risk reduction measures to take.  Clemon Chambers, RN, MSN, FNP-C [facility] Phone: [phone]  Clinic MD: Kellie Simmering  06/30/2014 4:57 PM

## 2014-07-01 NOTE — Addendum Note (Signed)
Addended by: Mena Goes on: 07/01/2014 02:11 PM   Modules accepted: Orders

## 2014-07-02 ENCOUNTER — Encounter (HOSPITAL_COMMUNITY): Payer: Self-pay | Admitting: Surgery

## 2014-07-07 DIAGNOSIS — E1151 Type 2 diabetes mellitus with diabetic peripheral angiopathy without gangrene: Secondary | ICD-10-CM | POA: Diagnosis not present

## 2014-07-07 DIAGNOSIS — B351 Tinea unguium: Secondary | ICD-10-CM | POA: Diagnosis not present

## 2014-07-07 DIAGNOSIS — L84 Corns and callosities: Secondary | ICD-10-CM | POA: Diagnosis not present

## 2014-07-07 DIAGNOSIS — L603 Nail dystrophy: Secondary | ICD-10-CM | POA: Diagnosis not present

## 2014-07-07 DIAGNOSIS — I739 Peripheral vascular disease, unspecified: Secondary | ICD-10-CM | POA: Diagnosis not present

## 2014-07-07 DIAGNOSIS — M79609 Pain in unspecified limb: Secondary | ICD-10-CM | POA: Diagnosis not present

## 2014-07-29 ENCOUNTER — Ambulatory Visit (INDEPENDENT_AMBULATORY_CARE_PROVIDER_SITE_OTHER)
Admission: RE | Admit: 2014-07-29 | Discharge: 2014-07-29 | Disposition: A | Discharge status: Home / Self Care | Payer: Medicare Other | Source: Ambulatory Visit | Attending: Internal Medicine | Admitting: Internal Medicine

## 2014-07-29 ENCOUNTER — Ambulatory Visit (INDEPENDENT_AMBULATORY_CARE_PROVIDER_SITE_OTHER): Payer: Medicare Other | Admitting: Internal Medicine

## 2014-07-29 ENCOUNTER — Encounter: Payer: Self-pay | Admitting: Internal Medicine

## 2014-07-29 VITALS — BP 120/72 | HR 60 | Temp 98.6°F | Resp 16 | Ht 65.0 in | Wt 142.0 lb

## 2014-07-29 DIAGNOSIS — R911 Solitary pulmonary nodule: Secondary | ICD-10-CM

## 2014-07-29 DIAGNOSIS — R05 Cough: Secondary | ICD-10-CM | POA: Insufficient documentation

## 2014-07-29 DIAGNOSIS — J202 Acute bronchitis due to streptococcus: Secondary | ICD-10-CM

## 2014-07-29 DIAGNOSIS — J42 Unspecified chronic bronchitis: Secondary | ICD-10-CM | POA: Diagnosis not present

## 2014-07-29 DIAGNOSIS — I700 Atherosclerosis of aorta: Secondary | ICD-10-CM | POA: Diagnosis not present

## 2014-07-29 DIAGNOSIS — R059 Cough, unspecified: Secondary | ICD-10-CM

## 2014-07-29 DIAGNOSIS — J479 Bronchiectasis, uncomplicated: Secondary | ICD-10-CM

## 2014-07-29 DIAGNOSIS — R918 Other nonspecific abnormal finding of lung field: Secondary | ICD-10-CM | POA: Diagnosis not present

## 2014-07-29 MED ORDER — AMOXICILLIN 875 MG PO TABS: 875.0000 mg | ORAL_TABLET | Freq: Two times a day (BID) | ORAL | Status: DC

## 2014-07-29 MED ORDER — PROMETHAZINE-DM 6.25-15 MG/5ML PO SYRP: 5.0000 mL | ORAL_SOLUTION | Freq: Four times a day (QID) | ORAL | Status: DC | PRN

## 2014-07-29 NOTE — Patient Instructions (Signed)

## 2014-07-30 ENCOUNTER — Encounter: Payer: Self-pay | Admitting: Internal Medicine

## 2014-07-30 DIAGNOSIS — J479 Bronchiectasis, uncomplicated: Secondary | ICD-10-CM | POA: Insufficient documentation

## 2014-07-30 NOTE — Assessment & Plan Note (Signed)
His has risk factors for lung CA He has renal insufficiency, will get a non-contrast CT to see if there is concern for cancer Will start amoxil for infection

## 2014-07-30 NOTE — Progress Notes (Signed)
Subjective:    Patient ID: Steven [name], male    DOB: 28-Dec-1945, 69 y.o.   MRN: [id number]  Cough This is a new problem. The current episode started in the past 7 days. The problem has been unchanged. The problem occurs every few hours. The cough is productive of purulent sputum. Associated symptoms include chills, a fever, postnasal drip, rhinorrhea and a sore throat. Pertinent negatives include no chest pain, ear congestion, ear pain, headaches, heartburn, hemoptysis, myalgias, nasal congestion, rash, shortness of breath, sweats, weight loss or wheezing. Nothing aggravates the symptoms. He has tried OTC cough suppressant for the symptoms. The treatment provided mild relief. His past medical history is significant for COPD and emphysema. There is no history of asthma, bronchiectasis, bronchitis, environmental allergies or pneumonia.      Review of Systems  Constitutional: Positive for fever and chills. Negative for weight loss, diaphoresis, activity change, appetite change, fatigue and unexpected weight change.  HENT: Positive for postnasal drip, rhinorrhea and sore throat. Negative for ear pain.   Eyes: Negative.   Respiratory: Positive for cough. Negative for apnea, hemoptysis, choking, chest tightness, shortness of breath, wheezing and stridor.   Cardiovascular: Negative.  Negative for chest pain, palpitations and leg swelling.  Gastrointestinal: Negative.  Negative for heartburn and abdominal pain.  Endocrine: Negative.   Genitourinary: Negative.   Musculoskeletal: Negative.  Negative for myalgias, back pain and arthralgias.  Skin: Negative.  Negative for rash.  Allergic/Immunologic: Negative.  Negative for environmental allergies.  Neurological: Negative.  Negative for headaches.  Hematological: Negative.  Negative for adenopathy. Does not bruise/bleed easily.  Psychiatric/Behavioral: Negative.        Objective:   Physical Exam  Constitutional: He is oriented to person,  place, and time. He appears well-developed and well-nourished.  Non-toxic appearance. He does not have a sickly appearance. He does not appear ill. No distress.  HENT:  Head: Normocephalic and atraumatic.  Mouth/Throat: Oropharynx is clear and moist. No oropharyngeal exudate.  Eyes: Conjunctivae are normal. Right eye exhibits no discharge. Left eye exhibits no discharge. No scleral icterus.  Neck: Normal range of motion. Neck supple. No JVD present. No tracheal deviation present. No thyromegaly present.  Cardiovascular: Normal rate, regular rhythm, normal heart sounds and intact distal pulses.  Exam reveals no gallop and no friction rub.   No murmur heard. Pulmonary/Chest: Effort normal and breath sounds normal. No stridor. No respiratory distress. He has no wheezes. He has no rales. He exhibits no tenderness.  Abdominal: Soft. Bowel sounds are normal. He exhibits no distension and no mass. There is no tenderness. There is no rebound and no guarding.  Musculoskeletal: Normal range of motion. He exhibits no edema or tenderness.  Lymphadenopathy:    He has no cervical adenopathy.  Neurological: He is oriented to person, place, and time.  Skin: Skin is warm and dry. No rash noted. He is not diaphoretic. No erythema. No pallor.  Psychiatric: He has a normal mood and affect. His behavior is normal. Judgment and thought content normal.  Vitals reviewed.    Lab Results  Component Value Date   WBC 7.8 01/28/2014   HGB 13.2 01/28/2014   HCT 40.3 01/28/2014   PLT 176.0 01/28/2014   GLUCOSE 117* 01/28/2014   CHOL 157 08/25/2013   TRIG 111.0 08/25/2013   HDL 53.80 08/25/2013   LDLCALC 81 08/25/2013   ALT 13 01/28/2014   AST 23 01/28/2014   NA 137 01/28/2014   K 3.5 01/28/2014   CL  101 01/28/2014   CREATININE 2.1* 01/28/2014   BUN 21 01/28/2014   CO2 28 01/28/2014   TSH 1.82 08/25/2013   PSA 3.27 08/08/2012   INR 1.07 02/03/2013   HGBA1C 6.5 01/28/2014   MICROALBUR 5.2* 08/08/2012        Assessment & Plan:

## 2014-07-30 NOTE — Assessment & Plan Note (Signed)
His CXR is neg for PNA but there is a new 7 mm nodule on the right

## 2014-07-30 NOTE — Assessment & Plan Note (Signed)
Will treat the infection with amoxil, will control the cough with phenergan-dm

## 2014-08-04 DIAGNOSIS — B351 Tinea unguium: Secondary | ICD-10-CM | POA: Diagnosis not present

## 2014-08-04 DIAGNOSIS — M79609 Pain in unspecified limb: Secondary | ICD-10-CM | POA: Diagnosis not present

## 2014-08-21 ENCOUNTER — Inpatient Hospital Stay: Admission: RE | Admit: 2014-08-21 | Payer: Self-pay | Source: Ambulatory Visit

## 2014-08-25 ENCOUNTER — Ambulatory Visit (INDEPENDENT_AMBULATORY_CARE_PROVIDER_SITE_OTHER)
Admission: RE | Admit: 2014-08-25 | Discharge: 2014-08-25 | Disposition: A | Discharge status: Home / Self Care | Payer: Medicare Other | Source: Ambulatory Visit | Attending: Internal Medicine | Admitting: Internal Medicine

## 2014-08-25 DIAGNOSIS — R05 Cough: Secondary | ICD-10-CM

## 2014-08-25 DIAGNOSIS — R911 Solitary pulmonary nodule: Secondary | ICD-10-CM

## 2014-08-25 DIAGNOSIS — J479 Bronchiectasis, uncomplicated: Secondary | ICD-10-CM | POA: Diagnosis not present

## 2014-08-25 DIAGNOSIS — R059 Cough, unspecified: Secondary | ICD-10-CM

## 2014-08-25 DIAGNOSIS — J984 Other disorders of lung: Secondary | ICD-10-CM | POA: Diagnosis not present

## 2014-08-25 DIAGNOSIS — J432 Centrilobular emphysema: Secondary | ICD-10-CM | POA: Diagnosis not present

## 2014-08-25 DIAGNOSIS — I2510 Atherosclerotic heart disease of native coronary artery without angina pectoris: Secondary | ICD-10-CM | POA: Diagnosis not present

## 2014-08-26 ENCOUNTER — Encounter: Payer: Self-pay | Admitting: Internal Medicine

## 2014-08-26 ENCOUNTER — Other Ambulatory Visit: Payer: Self-pay | Admitting: Internal Medicine

## 2014-09-12 ENCOUNTER — Ambulatory Visit (INDEPENDENT_AMBULATORY_CARE_PROVIDER_SITE_OTHER): Payer: Medicare Other | Admitting: Family

## 2014-09-12 ENCOUNTER — Encounter: Payer: Self-pay | Admitting: Family

## 2014-09-12 ENCOUNTER — Telehealth: Payer: Self-pay | Admitting: Internal Medicine

## 2014-09-12 VITALS — BP 144/70 | HR 62 | Temp 98.7°F | Ht 65.0 in | Wt 137.8 lb

## 2014-09-12 DIAGNOSIS — B9789 Other viral agents as the cause of diseases classified elsewhere: Principal | ICD-10-CM

## 2014-09-12 DIAGNOSIS — J069 Acute upper respiratory infection, unspecified: Secondary | ICD-10-CM

## 2014-09-12 MED ORDER — BENZONATATE 100 MG PO CAPS: 100.0000 mg | ORAL_CAPSULE | Freq: Three times a day (TID) | ORAL | Status: DC | PRN

## 2014-09-12 MED ORDER — MOMETASONE FUROATE 50 MCG/ACT NA SUSP: 2.0000 | Freq: Every day | NASAL | Status: DC

## 2014-09-12 NOTE — Progress Notes (Signed)
Subjective:    Patient ID: Steven [name], male    DOB: August 16, 1945, 69 y.o.   MRN: [id number]  HPI  Steven [name] is a 69 yr old male who present today with chief complaint of cough x 3 days. Reports that he has had some sneezing (occasional bloody nasal drainage).  He has had chills but has not checked his temperature.   He was treated for bronchitis about a month ago and notes that symptoms resolved from this episode after treatment. He did not start the phenergan cough syrup because he received a notice from his insurance that this is "not good for people my age."   Review of Systems    see HPI  Past Medical History  Diagnosis Date  . Coronary artery disease     Lexiscan Myoview 7/14:  Inf scar with minimal reversibility, inf HK, EF 45%;  Low risk.  Promus LAD stent placed in [place].  100% RCA  . Diabetes mellitus without complication   . Dyslipidemia   . Hypertension   . Peripheral vascular disease     a. s/p R fem-pop BPG 01/2013 (Dr. [name])  . Hernia   . Allergy   . Hyperlipidemia   . Sickle cell anemia     has trait  . GERD (gastroesophageal reflux disease)     has used Zantac in the past     History   Social History  . Marital Status: Married    Spouse Name: N/A  . Number of Children: 2  . Years of Education: N/A   Occupational History  . retired    Social History Main Topics  . Smoking status: Former Smoker    Types: Cigarettes    Quit date: 02/04/2011  . Smokeless tobacco: Never Used     Comment: Quit tobacco 2 years ago.    . Alcohol Use: 1.8 oz/week    3 Cans of beer per week     Comment: everyday, beer 40 ozs & alcohol- 1-2 shots   . Drug Use: No  . Sexual Activity: Yes   Other Topics Concern  . Not on file   Social History Narrative   Five children living.  Lives with wife.      Past Surgical History  Procedure Laterality Date  . Cardiac catheterization      with stent placement  . Inguinal hernia repair      Right  . Colonoscopy   March 2014  . Femoral-popliteal bypass graft Right 02/07/2013    Procedure: BYPASS GRAFT FEMORAL-POPLITEAL ARTERY- RIGHT;  Surgeon: Mal Misty, MD;  Location: [facility] OR;  Service: Vascular;  Laterality: Right;  Right femoral to below knee popliteal bypass using right non reversed great saphenous vein  . Intraoperative arteriogram Right 02/07/2013    Procedure: Attempted INTRA OPERATIVE ARTERIOGRAM;  Surgeon: Mal Misty, MD;  Location: [place] [facility];  Service: Vascular;  Laterality: Right;  . Abdominal aortagram N/A 12/10/2012    Procedure: ABDOMINAL Maxcine Ham;  Surgeon: Serafina Mitchell, MD;  Location: [facility] CATH LAB;  Service: Cardiovascular;  Laterality: N/A;    Family History  Problem Relation Age of Onset  . Diabetes Mother   . Hypertension Mother   . Heart disease Mother   . Hyperlipidemia Mother   . Alcohol abuse Neg Hx   . Cancer Neg Hx   . Depression Neg Hx   . Early death Neg Hx   . Kidney disease Neg Hx   . Stroke Neg Hx   .  Colon cancer Neg Hx   . Esophageal cancer Neg Hx   . Rectal cancer Neg Hx   . Stomach cancer Neg Hx   . Diabetes Father   . Heart disease Father   . Hyperlipidemia Father   . Hypertension Father   . Diabetes Sister   . Heart disease Sister   . Hyperlipidemia Sister   . Hypertension Sister   . Diabetes Brother   . Heart disease Brother   . Hyperlipidemia Brother   . Hypertension Brother   . Diabetes Son   . Heart disease Son   . Hyperlipidemia Son   . Hypertension Son     No Known Allergies  Current Outpatient Prescriptions on File Prior to Visit  Medication Sig Dispense Refill  . Aclidinium Bromide (TUDORZA PRESSAIR) 400 MCG/ACT AEPB Inhale 1 puff into the lungs 2 (two) times daily. 1 each 11  . aspirin EC 81 MG tablet Take 81 mg by mouth daily.    . hydrochlorothiazide (MICROZIDE) 12.5 MG capsule Take 25 mg by mouth daily.    . Insulin Detemir (LEVEMIR FLEXTOUCH) 100 UNIT/ML Pen Inject 30 Units into the skin daily at 10 pm. 15 mL 11  . Insulin  Pen Needle (BD PEN NEEDLE NANO U/F) 32G X 4 MM MISC use as directed with INSULIN 100 each 4  . losartan (COZAAR) 100 MG tablet Take 1 tablet (100 mg total) by mouth daily before breakfast. 60 tablet 3  . metFORMIN (GLUCOPHAGE) 1000 MG tablet Take 1,000 mg by mouth 2 (two) times daily with a meal.    . metFORMIN (GLUCOPHAGE) 1000 MG tablet TAKE 1 TABLET BY MOUTH TWICE DAILY WITH MEALS 180 tablet 1  . metoprolol succinate (TOPROL-XL) 50 MG 24 hr tablet Take by mouth daily.  3  . mometasone (NASONEX) 50 MCG/ACT nasal spray Place 2 sprays into the nose daily.    . promethazine-dextromethorphan (PROMETHAZINE-DM) 6.25-15 MG/5ML syrup Take 5 mLs by mouth 4 (four) times daily as needed for cough. 118 mL 0  . tamsulosin (FLOMAX) 0.4 MG CAPS capsule Take 1 capsule (0.4 mg total) by mouth daily after supper. 30 capsule 5  . terbinafine (LAMISIL) 250 MG tablet Take 250 mg by mouth daily.     . traMADol (ULTRAM) 50 MG tablet Take 1 tablet (50 mg total) by mouth every 8 (eight) hours as needed. 65 tablet 2   No current facility-administered medications on file prior to visit.    BP 144/70 mmHg  Pulse 62  Temp(Src) 98.7 F (37.1 C) (Oral)  Ht 5\' 5"  (1.651 m)  Wt 137 lb 12 oz (62.483 kg)  BMI 22.92 kg/m2  SpO2 96%    Objective:   Physical Exam  Constitutional: He is oriented to person, place, and time. He appears well-developed and well-nourished. No distress.  HENT:  Head: Normocephalic and atraumatic.  Right Ear: Tympanic membrane and ear canal normal.  Left Ear: Tympanic membrane and ear canal normal.  Mouth/Throat: No posterior oropharyngeal edema.  Cardiovascular: Normal rate and regular rhythm.   No murmur heard. Pulmonary/Chest: Effort normal and breath sounds normal. No respiratory distress. He has no wheezes. He has no rales.  Musculoskeletal: He exhibits no edema.  Neurological: He is alert and oriented to person, place, and time.  Skin: Skin is warm and dry.  Psychiatric: He has a  normal mood and affect. His behavior is normal. Thought content normal.          Assessment & Plan:  URI- symptoms most consistent  with URI at this point. Add tessalon prn.  Advised supportive measures and follow up as outlined in AVS.

## 2014-09-12 NOTE — Progress Notes (Signed)
Pre visit review using our clinic review tool, if applicable. No additional management support is needed unless otherwise documented below in the visit note. 

## 2014-09-12 NOTE — Telephone Encounter (Signed)
[facility] - Client TELEPHONE [facility] Patient Name: Steven [name] DOB: 08/23/1945 Initial Comment Caller states has cough, sneezing blood. Nurse Assessment Nurse: Erlene Quan, RN, Manuela Schwartz Date/Time Eilene Ghazi Time): 09/12/2014 9:48:25 AM Confirm and document reason for call. If symptomatic, describe symptoms. ---Caller states has cough, sneezing and blood. in the mucus - a lot of nasal and sinus congestion - very thick mucus he can not get loose - he is having fever - and before the doctor called in call medication but Medicare said that is is not good for elderly people so will not refill it for him he needs something Has the patient traveled out of the country within the last 30 days? ---Not Applicable Does the patient require triage? ---Yes Related visit to physician within the last 2 weeks? ---No Does the PT have any chronic conditions? (i.e. diabetes, asthma, etc.) ---Yes List chronic conditions. ---diabetic Guidelines Guideline Title Affirmed Question Affirmed Notes Cough - Acute Productive [1] Fever > 100.5 F (38.1 C) AND [2] diabetes mellitus or weak immune system (e.g., HIV positive, cancer chemo, splenectomy, organ transplant, chronic steroids) Final Disposition User See Physician within 4 Hours (or PCP triage) Erlene Quan, RN, Manuela Schwartz Comments caller was transferred to the [facility] for appointment this morning

## 2014-09-12 NOTE — Patient Instructions (Addendum)
You may use tessalon as needed for cough.  You may use mucinex 600mg  twice daily as needed for chest congestion. Drink plenty of fluids. Call if symptoms worsen or if symptoms are not improved in 2-3 days.

## 2014-09-14 NOTE — Telephone Encounter (Signed)
Pt seen in Sat clinic

## 2014-09-16 ENCOUNTER — Telehealth: Payer: Self-pay | Admitting: *Deleted

## 2014-09-16 NOTE — Telephone Encounter (Signed)
[facility] - Client TELEPHONE ADVICE RECORD [facility] Patient Name: Steven [name] Gender: Male DOB: 1946/03/06 Age: 69 Y 22 M 6 D Return Phone Number: [phone] (Primary) Address: [address] City/State/Zip: [place] [address] Client [facility] - Client Client Site [facility] - Night Physician Jones, Wilder Type Call Call Type Triage / Clinical Relationship To Patient Self Return Phone Number [phone] (Primary) Chief Complaint Nosebleed Initial Comment Caller states has cough, sneezing blood. PreDisposition Call Doctor Nurse Assessment Nurse: Erlene Quan, RN, Manuela Schwartz Date/Time Eilene Ghazi Time): 09/12/2014 9:48:25 AM Confirm and document reason for call. If symptomatic, describe symptoms. ---Caller states has cough, sneezing and blood. in the mucus - a lot of nasal and sinus congestion - very thick mucus he can not get loose - he is having fever - and before the doctor called in call medication but Medicare said that is is not good for elderly people so will not refill it for him he needs something Has the patient traveled out of the country within the last 30 days? ---Not Applicable Does the patient require triage? ---Yes Related visit to physician within the last 2 weeks? ---No Does the PT have any chronic conditions? (i.e. diabetes, asthma, etc.) ---Yes List chronic conditions. ---diabetic Guidelines Guideline Title Affirmed Question Affirmed Notes Nurse Date/Time (Eastern Time) Cough - Acute Productive [1] Fever > 100.5 F (38.1 C) AND [2] diabetes mellitus or weak immune system (e.g., HIV positive, cancer chemo, splenectomy, organ transplant, chronic steroids) Erlene Quan, RN, Manuela Schwartz 09/12/2014 9:50:46 AM Disp. Time Eilene Ghazi Time) Disposition Final User 09/12/2014 9:53:23 AM See Physician within 4 Hours (or PCP triage) Yes Erlene Quan, RN, Liliane Bade NOTE: All timestamps contained  within this report are represented as [place] Standard Time. CONFIDENTIALTY NOTICE: This fax transmission is intended only for the addressee. It contains information that is legally privileged, confidential or otherwise protected from use or disclosure. If you are not the intended recipient, you are strictly prohibited from reviewing, disclosing, copying using or disseminating any of this information or taking any action in reliance on or regarding this information. If you have received this fax in error, please notify us immediately by telephone so that we can arrange for its return to [place]. Phone: [phone], Toll-Free: [phone], Fax: [phone] Page: 2 of 2 Call Id: [id number] Caller Understands: Yes Disagree/Comply: Comply Care Advice Given Per Guideline SEE PHYSICIAN WITHIN 4 HOURS (or PCP triage): * IF NO PCP TRIAGE: You need to be seen. Go to _______________ (ED/UCC or office if it will be open) within the next 3 or 4 hours. Go sooner if you become worse. CALL BACK IF: * You become worse. CARE ADVICE given per Cough - Acute Productive (Adult) guideline. After Care Instructions Given Call Event Type User Date / Time Description Referrals [facility]

## 2014-10-01 ENCOUNTER — Other Ambulatory Visit: Payer: Self-pay | Admitting: Cardiology

## 2014-10-19 DIAGNOSIS — L84 Corns and callosities: Secondary | ICD-10-CM | POA: Diagnosis not present

## 2014-10-19 DIAGNOSIS — I739 Peripheral vascular disease, unspecified: Secondary | ICD-10-CM | POA: Diagnosis not present

## 2014-10-19 DIAGNOSIS — E1051 Type 1 diabetes mellitus with diabetic peripheral angiopathy without gangrene: Secondary | ICD-10-CM | POA: Diagnosis not present

## 2014-10-19 DIAGNOSIS — L603 Nail dystrophy: Secondary | ICD-10-CM | POA: Diagnosis not present

## 2014-10-23 ENCOUNTER — Other Ambulatory Visit: Payer: Self-pay

## 2014-10-23 DIAGNOSIS — I10 Essential (primary) hypertension: Secondary | ICD-10-CM

## 2014-10-23 MED ORDER — LOSARTAN POTASSIUM 100 MG PO TABS: 100.0000 mg | ORAL_TABLET | Freq: Every day | ORAL | Status: DC

## 2014-10-23 NOTE — Telephone Encounter (Signed)
Rx(s) sent to pharmacy electronically.  

## 2014-11-02 DIAGNOSIS — H2513 Age-related nuclear cataract, bilateral: Secondary | ICD-10-CM | POA: Diagnosis not present

## 2014-11-02 DIAGNOSIS — H25011 Cortical age-related cataract, right eye: Secondary | ICD-10-CM | POA: Diagnosis not present

## 2014-11-02 DIAGNOSIS — H11003 Unspecified pterygium of eye, bilateral: Secondary | ICD-10-CM | POA: Diagnosis not present

## 2014-11-02 DIAGNOSIS — E119 Type 2 diabetes mellitus without complications: Secondary | ICD-10-CM | POA: Diagnosis not present

## 2014-11-02 LAB — HM DIABETES EYE EXAM

## 2014-11-03 ENCOUNTER — Encounter: Payer: Self-pay | Admitting: Internal Medicine

## 2014-11-03 LAB — HM DIABETES EYE EXAM

## 2014-11-18 ENCOUNTER — Encounter: Payer: Self-pay | Admitting: Cardiology

## 2014-11-21 ENCOUNTER — Other Ambulatory Visit: Payer: Self-pay | Admitting: Internal Medicine

## 2014-12-16 ENCOUNTER — Other Ambulatory Visit: Payer: Self-pay | Admitting: Cardiology

## 2014-12-16 NOTE — Telephone Encounter (Signed)
Rx has been sent to the pharmacy electronically. ° °

## 2014-12-28 DIAGNOSIS — I739 Peripheral vascular disease, unspecified: Secondary | ICD-10-CM | POA: Diagnosis not present

## 2014-12-28 DIAGNOSIS — L84 Corns and callosities: Secondary | ICD-10-CM | POA: Diagnosis not present

## 2014-12-28 DIAGNOSIS — L603 Nail dystrophy: Secondary | ICD-10-CM | POA: Diagnosis not present

## 2014-12-28 DIAGNOSIS — E1051 Type 1 diabetes mellitus with diabetic peripheral angiopathy without gangrene: Secondary | ICD-10-CM | POA: Diagnosis not present

## 2014-12-29 ENCOUNTER — Encounter: Payer: Self-pay | Admitting: Internal Medicine

## 2014-12-29 ENCOUNTER — Ambulatory Visit (INDEPENDENT_AMBULATORY_CARE_PROVIDER_SITE_OTHER): Payer: Medicare Other | Admitting: Internal Medicine

## 2014-12-29 ENCOUNTER — Other Ambulatory Visit (INDEPENDENT_AMBULATORY_CARE_PROVIDER_SITE_OTHER): Payer: Medicare Other

## 2014-12-29 VITALS — BP 136/64 | HR 59 | Temp 98.0°F | Ht 65.0 in | Wt 135.0 lb

## 2014-12-29 DIAGNOSIS — I10 Essential (primary) hypertension: Secondary | ICD-10-CM

## 2014-12-29 DIAGNOSIS — E785 Hyperlipidemia, unspecified: Secondary | ICD-10-CM | POA: Diagnosis not present

## 2014-12-29 DIAGNOSIS — E139 Other specified diabetes mellitus without complications: Secondary | ICD-10-CM

## 2014-12-29 DIAGNOSIS — Z23 Encounter for immunization: Secondary | ICD-10-CM

## 2014-12-29 DIAGNOSIS — I2510 Atherosclerotic heart disease of native coronary artery without angina pectoris: Secondary | ICD-10-CM

## 2014-12-29 DIAGNOSIS — N182 Chronic kidney disease, stage 2 (mild): Secondary | ICD-10-CM | POA: Diagnosis not present

## 2014-12-29 DIAGNOSIS — Z0000 Encounter for general adult medical examination without abnormal findings: Secondary | ICD-10-CM | POA: Diagnosis not present

## 2014-12-29 DIAGNOSIS — N400 Enlarged prostate without lower urinary tract symptoms: Secondary | ICD-10-CM

## 2014-12-29 LAB — FECAL OCCULT BLOOD, GUAIAC: Fecal Occult Blood: NEGATIVE

## 2014-12-29 LAB — LIPID PANEL
CHOLESTEROL: 271 mg/dL — AB (ref 0–200)
HDL: 68.3 mg/dL (ref >39.00)
LDL Cholesterol: 174 mg/dL — ABNORMAL HIGH (ref 0–99)
NonHDL: 202.7
TRIGLYCERIDES: 143 mg/dL (ref 0.0–149.0)
Total CHOL/HDL Ratio: 4
VLDL: 28.6 mg/dL (ref 0.0–40.0)

## 2014-12-29 LAB — URINALYSIS, ROUTINE W REFLEX MICROSCOPIC
Bilirubin Urine: NEGATIVE
Ketones, ur: NEGATIVE
Leukocytes, UA: NEGATIVE
NITRITE: NEGATIVE
Specific Gravity, Urine: 1.02 (ref 1.000–1.030)
Total Protein, Urine: NEGATIVE
URINE GLUCOSE: NEGATIVE
UROBILINOGEN UA: 0.2 (ref 0.0–1.0)
pH: 5.5 (ref 5.0–8.0)

## 2014-12-29 LAB — COMPREHENSIVE METABOLIC PANEL
ALBUMIN: 4.4 g/dL (ref 3.5–5.2)
ALT: 10 U/L (ref 0–53)
AST: 15 U/L (ref 0–37)
Alkaline Phosphatase: 71 U/L (ref 39–117)
BUN: 30 mg/dL — AB (ref 6–23)
CO2: 28 mEq/L (ref 19–32)
CREATININE: 1.7 mg/dL — AB (ref 0.40–1.50)
Calcium: 10.4 mg/dL (ref 8.4–10.5)
Chloride: 103 mEq/L (ref 96–112)
GFR: 52 mL/min — AB (ref >60.00)
Glucose, Bld: 133 mg/dL — ABNORMAL HIGH (ref 70–99)
Potassium: 4 mEq/L (ref 3.5–5.1)
Sodium: 140 mEq/L (ref 135–145)
Total Bilirubin: 0.5 mg/dL (ref 0.2–1.2)
Total Protein: 7.2 g/dL (ref 6.0–8.3)

## 2014-12-29 LAB — CBC WITH DIFFERENTIAL/PLATELET
BASOS ABS: 0 10*3/uL (ref 0.0–0.1)
Basophils Relative: 0.4 % (ref 0.0–3.0)
Eosinophils Absolute: 0.3 10*3/uL (ref 0.0–0.7)
Eosinophils Relative: 3.6 % (ref 0.0–5.0)
HEMATOCRIT: 42.1 % (ref 39.0–52.0)
Hemoglobin: 14.4 g/dL (ref 13.0–17.0)
LYMPHS ABS: 2.7 10*3/uL (ref 0.7–4.0)
LYMPHS PCT: 38.7 % (ref 12.0–46.0)
MCHC: 34.1 g/dL (ref 30.0–36.0)
MCV: 84.8 fl (ref 78.0–100.0)
Monocytes Absolute: 0.4 10*3/uL (ref 0.1–1.0)
Monocytes Relative: 5.3 % (ref 3.0–12.0)
Neutro Abs: 3.6 10*3/uL (ref 1.4–7.7)
Neutrophils Relative %: 52 % (ref 43.0–77.0)
Platelets: 170 10*3/uL (ref 150.0–400.0)
RBC: 4.96 Mil/uL (ref 4.22–5.81)
RDW: 14.7 % (ref 11.5–15.5)
WBC: 7 10*3/uL (ref 4.0–10.5)

## 2014-12-29 LAB — MICROALBUMIN / CREATININE URINE RATIO
CREATININE, U: 146.4 mg/dL
MICROALB UR: 9.1 mg/dL — AB (ref 0.0–1.9)
Microalb Creat Ratio: 6.2 mg/g (ref 0.0–30.0)

## 2014-12-29 LAB — PSA: PSA: 3.76 ng/mL (ref 0.10–4.00)

## 2014-12-29 LAB — TSH: TSH: 1.08 u[IU]/mL (ref 0.35–4.50)

## 2014-12-29 LAB — HEMOGLOBIN A1C: HEMOGLOBIN A1C: 7 % — AB (ref 4.6–6.5)

## 2014-12-29 MED ORDER — TAMSULOSIN HCL 0.4 MG PO CAPS: 0.4000 mg | ORAL_CAPSULE | Freq: Every day | ORAL | Status: DC

## 2014-12-29 MED ORDER — ROSUVASTATIN CALCIUM 20 MG PO TABS: 20.0000 mg | ORAL_TABLET | Freq: Every day | ORAL | Status: DC

## 2014-12-29 MED ORDER — INSULIN DETEMIR 100 UNIT/ML FLEXPEN: 30.0000 [IU] | PEN_INJECTOR | Freq: Every day | SUBCUTANEOUS | Status: DC

## 2014-12-29 NOTE — Assessment & Plan Note (Signed)

## 2014-12-29 NOTE — Progress Notes (Signed)
Subjective:  Patient ID: Steven [name], male    DOB: May 15, 1946  Age: 69 y.o. MRN: [id number]  CC: Diabetes; Annual Exam; Hypertension; and Hyperlipidemia   HPI LAYMAN GULLY presents for a CPX and follow up on medical problems. He feels well and offers no complaints.  Outpatient Prescriptions Prior to Visit  Medication Sig Dispense Refill  . Aclidinium Bromide (TUDORZA PRESSAIR) 400 MCG/ACT AEPB Inhale 1 puff into the lungs 2 (two) times daily. 1 each 11  . aspirin EC 81 MG tablet Take 81 mg by mouth daily.    . hydrochlorothiazide (MICROZIDE) 12.5 MG capsule Take 25 mg by mouth daily.    . Insulin Pen Needle (BD PEN NEEDLE NANO U/F) 32G X 4 MM MISC use as directed with INSULIN 100 each 4  . losartan (COZAAR) 100 MG tablet Take 1 tablet (100 mg total) by mouth daily before breakfast. 90 tablet 1  . metFORMIN (GLUCOPHAGE) 1000 MG tablet Take 1,000 mg by mouth 2 (two) times daily with a meal.    . metoprolol succinate (TOPROL-XL) 50 MG 24 hr tablet TAKE 1 TABLET BY MOUTH EVERY DAY WITH OR IMMEDIATELY FOLLOWING A MEAL 90 tablet 1  . mometasone (NASONEX) 50 MCG/ACT nasal spray Place 2 sprays into the nose daily. 17 g 0  . traMADol (ULTRAM) 50 MG tablet Take 1 tablet (50 mg total) by mouth every 8 (eight) hours as needed. 65 tablet 2  . Insulin Detemir (LEVEMIR FLEXTOUCH) 100 UNIT/ML Pen Inject 30 Units into the skin daily at 10 pm. 15 mL 11  . tamsulosin (FLOMAX) 0.4 MG CAPS capsule Take 1 capsule (0.4 mg total) by mouth daily after supper. 30 capsule 5  . benzonatate (TESSALON) 100 MG capsule Take 1 capsule (100 mg total) by mouth 3 (three) times daily as needed. 20 capsule 0  . hydrochlorothiazide (MICROZIDE) 12.5 MG capsule TAKE 2 CAPSULES BY MOUTH EVERY DAY 180 capsule 0  . metoprolol succinate (TOPROL-XL) 50 MG 24 hr tablet Take by mouth daily.  3  . terbinafine (LAMISIL) 250 MG tablet Take 250 mg by mouth daily.      No facility-administered medications prior to visit.     ROS Review of Systems  Constitutional: Negative.  Negative for fever, chills, diaphoresis, appetite change and fatigue.  HENT: Negative.  Negative for trouble swallowing and voice change.   Eyes: Negative.   Respiratory: Negative.  Negative for cough, choking, chest tightness, shortness of breath and stridor.   Cardiovascular: Negative.  Negative for chest pain, palpitations and leg swelling.  Gastrointestinal: Negative.  Negative for abdominal pain.  Endocrine: Negative.  Negative for polydipsia, polyphagia and polyuria.  Genitourinary: Positive for difficulty urinating. Negative for dysuria, frequency, hematuria, scrotal swelling, enuresis and testicular pain.       Some nocturia ad weak stream  Musculoskeletal: Negative.  Negative for myalgias, back pain, joint swelling, arthralgias and neck pain.  Skin: Negative.  Negative for rash.  Allergic/Immunologic: Negative.   Neurological: Negative.  Negative for dizziness, tremors, weakness, light-headedness, numbness and headaches.  Hematological: Negative.  Negative for adenopathy. Does not bruise/bleed easily.  Psychiatric/Behavioral: Negative.     Objective:  BP 136/64 mmHg  Pulse 59  Temp(Src) 98 F (36.7 C) (Oral)  Ht 5\' 5"  (1.651 m)  Wt 135 lb (61.236 kg)  BMI 22.47 kg/m2  SpO2 97%  BP Readings from Last 3 Encounters:  12/29/14 136/64  09/12/14 144/70  07/29/14 120/72    Wt Readings from Last 3 Encounters:  12/29/14 135 lb (61.236 kg)  09/12/14 137 lb 12 oz (62.483 kg)  07/29/14 142 lb (64.411 kg)    Physical Exam  Constitutional: He is oriented to person, place, and time. He appears well-developed and well-nourished. No distress.  HENT:  Head: Normocephalic and atraumatic.  Mouth/Throat: Oropharynx is clear and moist. No oropharyngeal exudate.  Eyes: Conjunctivae are normal. Right eye exhibits no discharge. Left eye exhibits no discharge. No scleral icterus.  Neck: Normal range of motion. Neck supple. No JVD  present. No tracheal deviation present. No thyromegaly present.  Cardiovascular: Normal rate, regular rhythm, normal heart sounds and intact distal pulses.  Exam reveals no gallop and no friction rub.   No murmur heard. Pulmonary/Chest: Effort normal and breath sounds normal. No stridor. No respiratory distress. He has no wheezes. He has no rales. He exhibits no tenderness.  Abdominal: Soft. Bowel sounds are normal. He exhibits no distension and no mass. There is no tenderness. There is no rebound and no guarding. Hernia confirmed negative in the right inguinal area and confirmed negative in the left inguinal area.  Genitourinary: Rectum normal, testes normal and penis normal. Rectal exam shows no external hemorrhoid, no internal hemorrhoid, no fissure, no mass, no tenderness and anal tone normal. Guaiac negative stool. Prostate is enlarged (1+ smooth symm BPH). Prostate is not tender. Right testis shows no mass, no swelling and no tenderness. Right testis is descended. Left testis shows no mass, no swelling and no tenderness. Left testis is descended. Circumcised. No penile erythema or penile tenderness. No discharge found.  Musculoskeletal: Normal range of motion. He exhibits no edema or tenderness.  Lymphadenopathy:    He has no cervical adenopathy.       Right: No inguinal adenopathy present.       Left: No inguinal adenopathy present.  Neurological: He is oriented to person, place, and time.  Skin: Skin is warm and dry. No rash noted. He is not diaphoretic. No erythema. No pallor.  Psychiatric: He has a normal mood and affect. His behavior is normal. Judgment and thought content normal.  Vitals reviewed.   Lab Results  Component Value Date   WBC 7.0 12/29/2014   HGB 14.4 12/29/2014   HCT 42.1 12/29/2014   PLT 170.0 12/29/2014   GLUCOSE 133* 12/29/2014   CHOL 271* 12/29/2014   TRIG 143.0 12/29/2014   HDL 68.30 12/29/2014   LDLCALC 174* 12/29/2014   ALT 10 12/29/2014   AST 15  12/29/2014   NA 140 12/29/2014   K 4.0 12/29/2014   CL 103 12/29/2014   CREATININE 1.70* 12/29/2014   BUN 30* 12/29/2014   CO2 28 12/29/2014   TSH 1.08 12/29/2014   PSA 3.76 12/29/2014   INR 1.07 02/03/2013   HGBA1C 7.0* 12/29/2014   MICROALBUR 9.1* 12/29/2014    Ct Chest Wo Contrast  08/25/2014   CLINICAL DATA:  Right lung nodules seen on chest radiography. Chronic shortness of Breath. Cough.  EXAM: CT CHEST WITHOUT CONTRAST  TECHNIQUE: Multidetector CT imaging of the chest was performed following the standard protocol without IV contrast.  COMPARISON:  Multiple exams, including 07/29/2014  FINDINGS: Mediastinum/Nodes: Branch vessel, aortic, and coronary atherosclerotic calcification. Scattered small axillary lymph nodes are observed. No definite pathologic mediastinal or hilar adenopathy.  Lungs/Pleura: Centrilobular emphysema. The density on radiography probably corresponds to the scarring posteriorly in the right upper lobe on images 21 through 27 of series 3. I do not see a separate nodule to correlate with this finding.  There  is mild scarring in the right middle lobe and posteriorly in the left lower lobe there is some irregular bandlike opacity favoring scarring or atelectasis given the associated architectural distortion and volume loss. Mild localized bronchiectasis in this vicinity favors a postinflammatory scarring.  Lingular and left lower lobe scarring is also present.  Upper abdomen: Unremarkable  Musculoskeletal: Unremarkable  IMPRESSION: 1. Scattered scarring in the lungs without a worrisome pulmonary nodule identified. In particular the vague the right-sided density on chest radiography seems to correspond to some of this scarring. There is some mild associated cylindrical bronchiectasis. 2. Atherosclerosis. 3. Centrilobular emphysema.   Electronically Signed   By: Sherryl Barters M.D.   On: 08/25/2014 19:53    Assessment & Plan:   Hinton was seen today for diabetes, annual exam,  hypertension and hyperlipidemia.  Diagnoses and all orders for this visit:  Essential hypertension, benign - his BP is well controlled, lytes and renal function are stable Orders: -     Comprehensive metabolic panel; Future -     CBC with Differential/Platelet; Future -     Urinalysis, Routine w reflex microscopic (not at [facility]); Future  Coronary artery disease involving native coronary artery of native heart without angina pectoris Orders: -     Lipid panel; Future -     rosuvastatin (CRESTOR) 20 MG tablet; Take 1 tablet (20 mg total) by mouth daily.  Diabetes 1.5, managed as type 1 - his blood sugars are adequately well controlled, will cont the current regimen Orders: -     Hemoglobin A1c; Future -     Microalbumin / creatinine urine ratio; Future -     Insulin Detemir (LEVEMIR FLEXTOUCH) 100 UNIT/ML Pen; Inject 30 Units into the skin daily at 10 pm.  Chronic renal insufficiency, stage 2 (mild)  Hyperlipidemia with target LDL less than 70 - his LDL is very high, will start a statin Orders: -     Lipid panel; Future -     TSH; Future -     rosuvastatin (CRESTOR) 20 MG tablet; Take 1 tablet (20 mg total) by mouth daily.  BPH (benign prostatic hyperplasia) - will restart flomax Orders: -     PSA; Future -     tamsulosin (FLOMAX) 0.4 MG CAPS capsule; Take 1 capsule (0.4 mg total) by mouth daily after supper.  Routine general medical examination at a health care facility  Need for prophylactic vaccination with combined diphtheria-tetanus-pertussis (DTP) vaccine Orders: -     Pneumococcal conjugate vaccine 13-valent IM  Need for prophylactic vaccination and inoculation against unspecified single disease Orders: -     Varicella-zoster vaccine subcutaneous   I have discontinued Mr. Goodbar terbinafine and benzonatate. I am also having him start on rosuvastatin. Additionally, I am having him maintain his aspirin EC, Aclidinium Bromide, Insulin Pen Needle, traMADol,  hydrochlorothiazide, metFORMIN, mometasone, losartan, metoprolol succinate, tamsulosin, and Insulin Detemir.  Meds ordered this encounter  Medications  . tamsulosin (FLOMAX) 0.4 MG CAPS capsule    Sig: Take 1 capsule (0.4 mg total) by mouth daily after supper.    Dispense:  90 capsule    Refill:  3  . Insulin Detemir (LEVEMIR FLEXTOUCH) 100 UNIT/ML Pen    Sig: Inject 30 Units into the skin daily at 10 pm.    Dispense:  15 mL    Refill:  11  . rosuvastatin (CRESTOR) 20 MG tablet    Sig: Take 1 tablet (20 mg total) by mouth daily.    Dispense:  90  tablet    Refill:  3   See AVS for instructions about healthy living and anticipatory guidance.  Follow-up: Return in about 4 months (around 04/30/2015).  Scarlette Calico, MD

## 2014-12-29 NOTE — Patient Instructions (Signed)

## 2014-12-29 NOTE — Progress Notes (Signed)
Pre visit review using our clinic review tool, if applicable. No additional management support is needed unless otherwise documented below in the visit note. 

## 2014-12-30 ENCOUNTER — Encounter: Payer: Self-pay | Admitting: Internal Medicine

## 2015-01-05 ENCOUNTER — Encounter: Payer: Self-pay | Admitting: Family

## 2015-01-06 ENCOUNTER — Telehealth: Payer: Self-pay

## 2015-01-06 NOTE — Telephone Encounter (Signed)
Trying to start PA for the Levemir but no information to start PA via cover my meds and no number on scanned ins card to call and initiate PA.  Called pt and he was able to give me another number.   Contacted Ins. Not able to get in touch with a rep to get the PA started.  NOT doing the PA  Contacted the pharmacy and informed the same.

## 2015-01-07 ENCOUNTER — Telehealth: Payer: Self-pay

## 2015-01-07 ENCOUNTER — Other Ambulatory Visit: Payer: Self-pay

## 2015-01-07 DIAGNOSIS — E139 Other specified diabetes mellitus without complications: Secondary | ICD-10-CM

## 2015-01-07 MED ORDER — INSULIN DETEMIR 100 UNIT/ML FLEXPEN: 30.0000 [IU] | PEN_INJECTOR | Freq: Every day | SUBCUTANEOUS | Status: DC

## 2015-01-07 MED ORDER — INSULIN GLARGINE 100 UNIT/ML SOLOSTAR PEN: 30.0000 [IU] | PEN_INJECTOR | Freq: Every day | SUBCUTANEOUS | Status: DC

## 2015-01-07 NOTE — Telephone Encounter (Signed)
PA has been approved. [place] done.

## 2015-01-07 NOTE — Addendum Note (Signed)
Addended by: Lowella Dandy on: 01/07/2015 04:26 PM   Modules accepted: Medications

## 2015-01-07 NOTE — Telephone Encounter (Signed)
Changed to lantus solostar pen

## 2015-01-07 NOTE — Telephone Encounter (Signed)
Insurance called and asked specific questions about the Levemir and reasons for it to be considered a tier exception.

## 2015-01-07 NOTE — Telephone Encounter (Signed)
Received a fax today requesting a physician statement supporting the use of the Levemir Flextouch 100/ml.  Will type up a letter and stamp then fax with your approval.   Fax states:  "Please document if the preferred formulary agents at a lower tier copay 1) Would not be effective as the requested non-preferred drug and/or 2) would have adverse effects. Please note preferred formulary drugs include Lantus or Lantus Solostar. Is there medical reason why include Lantus, Lantus Solostar at tier 2 cannot be considered?"  Just need some specifics about the reason for the Levemir. Please advise at your convenience.   Fax letter to: [phone]

## 2015-01-08 ENCOUNTER — Telehealth: Payer: Self-pay | Admitting: Internal Medicine

## 2015-01-08 NOTE — Telephone Encounter (Signed)
Spoke with pt wife who advised that pt wish to remain on levemir, pharmacy notified to d/c lantus.

## 2015-01-08 NOTE — Telephone Encounter (Signed)
States pharmacy has two insulins called in.  Patient would like to know which insulin to pick up.  Patient uses [facility].

## 2015-01-11 ENCOUNTER — Ambulatory Visit (INDEPENDENT_AMBULATORY_CARE_PROVIDER_SITE_OTHER)
Admission: RE | Admit: 2015-01-11 | Discharge: 2015-01-11 | Disposition: A | Discharge status: Home / Self Care | Payer: Medicare Other | Source: Ambulatory Visit | Attending: Family | Admitting: Family

## 2015-01-11 ENCOUNTER — Ambulatory Visit (HOSPITAL_COMMUNITY)
Admission: RE | Admit: 2015-01-11 | Discharge: 2015-01-11 | Disposition: A | Discharge status: Home / Self Care | Payer: Medicare Other | Source: Ambulatory Visit | Attending: Family | Admitting: Family

## 2015-01-11 ENCOUNTER — Encounter: Payer: Self-pay | Admitting: Family

## 2015-01-11 ENCOUNTER — Ambulatory Visit (INDEPENDENT_AMBULATORY_CARE_PROVIDER_SITE_OTHER): Payer: Medicare Other | Admitting: Family

## 2015-01-11 VITALS — BP 133/73 | HR 59 | Resp 14 | Ht 65.0 in | Wt 132.0 lb

## 2015-01-11 DIAGNOSIS — I70212 Atherosclerosis of native arteries of extremities with intermittent claudication, left leg: Secondary | ICD-10-CM | POA: Diagnosis not present

## 2015-01-11 DIAGNOSIS — Z9889 Other specified postprocedural states: Secondary | ICD-10-CM

## 2015-01-11 DIAGNOSIS — Z95828 Presence of other vascular implants and grafts: Secondary | ICD-10-CM

## 2015-01-11 DIAGNOSIS — Z87891 Personal history of nicotine dependence: Secondary | ICD-10-CM

## 2015-01-11 DIAGNOSIS — Z4889 Encounter for other specified surgical aftercare: Secondary | ICD-10-CM | POA: Diagnosis not present

## 2015-01-11 DIAGNOSIS — Z48812 Encounter for surgical aftercare following surgery on the circulatory system: Secondary | ICD-10-CM

## 2015-01-11 DIAGNOSIS — I739 Peripheral vascular disease, unspecified: Secondary | ICD-10-CM | POA: Insufficient documentation

## 2015-01-11 NOTE — Patient Instructions (Signed)

## 2015-01-11 NOTE — Progress Notes (Signed)
[facility] HISTORY AND PHYSICAL -PAD  History of Present Illness Steven [name] is a 69 y.o. male  patient of Dr. Kellie Simmering who returns status post right femoral-popliteal bypass with saphenous vein graft in July of 2014 for severe claudication right leg. He is having no right leg claudication symptoms. He denies non healing wounds.  He has right hip pain if he walks fast or a long distance, denies right hip pain in the morning. He also has left calf and thigh heavy feeling when he walks fast, relieved by rest, states this is how his right leg felt before the bypass in his right leg.   The patient denies New Medical or Surgical History. He has CAD, sees a cardiologist.  Pt Diabetic: Yes, Review of records: A1C on 12/29/2014 was 7.0, in control Pt smoker: former smoker, quit in 2012  Pt meds include: Statin: yes, resumed June 2016 Betablocker: Yes ASA: Yes Other anticoagulants/antiplatelets: no    Past Medical History  Diagnosis Date  . Coronary artery disease     Lexiscan Myoview 7/14:  Inf scar with minimal reversibility, inf HK, EF 45%;  Low risk.  Promus LAD stent placed in [place].  100% RCA  . Diabetes mellitus without complication   . Dyslipidemia   . Hypertension   . Peripheral vascular disease     a. s/p R fem-pop BPG 01/2013 (Dr. [name])  . Hernia   . Allergy   . Hyperlipidemia   . Sickle cell anemia     has trait  . GERD (gastroesophageal reflux disease)     has used Zantac in the past     Social History History  Substance Use Topics  . Smoking status: Former Smoker    Types: Cigarettes    Quit date: 02/04/2011  . Smokeless tobacco: Never Used     Comment: Quit tobacco 2 years ago.    . Alcohol Use: 1.8 oz/week    3 Cans of beer per week     Comment: everyday, beer 40 ozs & alcohol- 1-2 shots     Family History Family History  Problem Relation Age of Onset  . Diabetes Mother   . Hypertension Mother   . Heart disease Mother    . Hyperlipidemia Mother   . Alcohol abuse Neg Hx   . Cancer Neg Hx   . Depression Neg Hx   . Early death Neg Hx   . Kidney disease Neg Hx   . Stroke Neg Hx   . Colon cancer Neg Hx   . Esophageal cancer Neg Hx   . Rectal cancer Neg Hx   . Stomach cancer Neg Hx   . Diabetes Father   . Heart disease Father   . Hyperlipidemia Father   . Hypertension Father   . Diabetes Sister   . Heart disease Sister   . Hyperlipidemia Sister   . Hypertension Sister   . Diabetes Brother   . Heart disease Brother   . Hyperlipidemia Brother   . Hypertension Brother   . Diabetes Son   . Heart disease Son   . Hyperlipidemia Son   . Hypertension Son     Past Surgical History  Procedure Laterality Date  . Cardiac catheterization      with stent placement  . Inguinal hernia repair      Right  . Colonoscopy  March 2014  . Femoral-popliteal bypass graft Right 02/07/2013    Procedure: BYPASS GRAFT FEMORAL-POPLITEAL ARTERY- RIGHT;  Surgeon: Jeneen Rinks  Rockwell Alexandria, MD;  Location: [facility] OR;  Service: Vascular;  Laterality: Right;  Right femoral to below knee popliteal bypass using right non reversed great saphenous vein  . Intraoperative arteriogram Right 02/07/2013    Procedure: Attempted INTRA OPERATIVE ARTERIOGRAM;  Surgeon: Mal Misty, MD;  Location: [facility];  Service: Vascular;  Laterality: Right;  . Abdominal aortagram N/A 12/10/2012    Procedure: ABDOMINAL Maxcine Ham;  Surgeon: Serafina Mitchell, MD;  Location: [facility] CATH LAB;  Service: Cardiovascular;  Laterality: N/A;    No Known Allergies  Current Outpatient Prescriptions  Medication Sig Dispense Refill  . Aclidinium Bromide (TUDORZA PRESSAIR) 400 MCG/ACT AEPB Inhale 1 puff into the lungs 2 (two) times daily. 1 each 11  . aspirin EC 81 MG tablet Take 81 mg by mouth daily.    . hydrochlorothiazide (MICROZIDE) 12.5 MG capsule Take 25 mg by mouth daily.    . Insulin Detemir (LEVEMIR FLEXTOUCH) 100 UNIT/ML Pen Inject 30 Units into the skin daily at 10 pm. 15  pen 3  . Insulin Glargine (LANTUS SOLOSTAR) 100 UNIT/ML Solostar Pen Inject 30 Units into the skin daily at 10 pm. 15 mL 11  . Insulin Pen Needle (BD PEN NEEDLE NANO U/F) 32G X 4 MM MISC use as directed with INSULIN 100 each 4  . losartan (COZAAR) 100 MG tablet Take 1 tablet (100 mg total) by mouth daily before breakfast. 90 tablet 1  . metFORMIN (GLUCOPHAGE) 1000 MG tablet Take 1,000 mg by mouth 2 (two) times daily with a meal.    . metoprolol succinate (TOPROL-XL) 50 MG 24 hr tablet TAKE 1 TABLET BY MOUTH EVERY DAY WITH OR IMMEDIATELY FOLLOWING A MEAL 90 tablet 1  . mometasone (NASONEX) 50 MCG/ACT nasal spray Place 2 sprays into the nose daily. 17 g 0  . rosuvastatin (CRESTOR) 20 MG tablet Take 1 tablet (20 mg total) by mouth daily. 90 tablet 3  . tamsulosin (FLOMAX) 0.4 MG CAPS capsule Take 1 capsule (0.4 mg total) by mouth daily after supper. 90 capsule 3  . traMADol (ULTRAM) 50 MG tablet Take 1 tablet (50 mg total) by mouth every 8 (eight) hours as needed. 65 tablet 2   No current facility-administered medications for this visit.    ROS: See HPI for pertinent positives and negatives.   Physical Examination  Filed Vitals:   01/11/15 1610  BP: 133/73  Pulse: 59  Resp: 14  Height: 5\' 5"  (1.651 m)  Weight: 132 lb (59.875 kg)  SpO2: 98%   Body mass index is 21.97 kg/(m^2).   General: A&O x 3, WDWN. Gait: normal Eyes: PERRLA. Pulmonary: CTAB, without wheezes , rales or rhonchi. Cardiac: regular Rythm , without detected murmur.     Carotid Bruits Right Left   Negative Negative  Aorta is not palpable. Radial pulses: are 1+ palpable and =   VASCULAR EXAM: Extremities without ischemic changes  without Gangrene; without open wounds.     LE Pulses Right Left   FEMORAL 3+ palpable 2+  palpable    POPLITEAL not palpable  not palpable   POSTERIOR TIBIAL 2+ palpable  not palpable    DORSALIS PEDIS  ANTERIOR TIBIAL not palpable  not palpable    Abdomen: soft, NT, no palpable masses. Skin: no rashes, no ulcers. Musculoskeletal: no muscle wasting or atrophy. Neurologic: A&O X 3; Appropriate Affect; MOTOR FUNCTION: moving all extremities equally, motor strength 5/5 throughout. Speech is fluent/normal. CN 2-12 intact.          Non-Invasive  Vascular Imaging: DATE: 01/11/2015 ABI: RIGHT: 1.03 (1.02, 12/23/2013), Waveforms: triphasic PT, monophasic DP, TBI: 0.44 (0.43);  LEFT: 0.56 (0.60), Waveforms: monophasic PT and DP, TBI: 0.16 (0.18) DUPLEX SCAN OF BYPASS(right lower extremity): 212 cm/s velocity at CFA, 247 cm/s at proximal anastomosis, 144 cm/s velocity at tibial trunk. Inflow and outflow disease with velocities slightly less than previous exam.    ASSESSMENT: MARQUIST BINSTOCK is a 69 y.o. male who is status post right femoral-popliteal bypass with saphenous vein graft in July of 2014 for severe claudication right leg. He is having no right leg claudication symptoms.  He walks a great deal as part of his job which facilitates collateral arterial perfusion. He has mild claudication in his left leg, is not life limiting. Today's right LE arterial Duplex suggests inflow and outflow disease with velocities slightly less than previous exam.  Right ABI remains normal and stable with tri and monophasic waveforms, right TBI remains stable but below normal.  Left ABI also remains stable with severe arterial occlusive disease, TBI remains stable and abnormally low. He has no tissue loss in his feet or toes which are pink and warm.  His atherosclerotic risk factors include controlled DM, former smoker, and CAD.   PLAN:  I discussed in depth with the patient the nature of atherosclerosis, and emphasized the importance of maximal medical  management including strict control of blood pressure, blood glucose, and lipid levels, obtaining regular exercise, and continued cessation of smoking.  The patient is aware that without maximal medical management the underlying atherosclerotic disease process will progress, limiting the benefit of any interventions.  Based on the patient's vascular studies and examination, pt will return to clinic in 3 months with ABI's and bilateral LE arterial Duplex and see Dr. Kellie Simmering. He knows to return sooner if he develops worse pain in his legs with walking or non healing wounds in his feet.   The patient was given information about PAD including signs, symptoms, treatment, what symptoms should prompt the patient to seek immediate medical care, and risk reduction measures to take.  Clemon Chambers, RN, MSN, FNP-C [facility] Phone: [phone]  Clinic MD: Trula Slade  01/11/2015 4:06 PM

## 2015-01-12 NOTE — Addendum Note (Signed)
Addended by: Dorthula Rue L on: 01/12/2015 03:07 PM   Modules accepted: Orders

## 2015-03-15 ENCOUNTER — Encounter: Payer: Self-pay | Admitting: Cardiology

## 2015-03-15 ENCOUNTER — Ambulatory Visit (INDEPENDENT_AMBULATORY_CARE_PROVIDER_SITE_OTHER): Payer: Medicare Other | Admitting: Cardiology

## 2015-03-15 VITALS — BP 130/72 | HR 69 | Ht 65.0 in | Wt 137.5 lb

## 2015-03-15 DIAGNOSIS — I2510 Atherosclerotic heart disease of native coronary artery without angina pectoris: Secondary | ICD-10-CM | POA: Diagnosis not present

## 2015-03-15 DIAGNOSIS — I10 Essential (primary) hypertension: Secondary | ICD-10-CM | POA: Diagnosis not present

## 2015-03-15 DIAGNOSIS — I70212 Atherosclerosis of native arteries of extremities with intermittent claudication, left leg: Secondary | ICD-10-CM

## 2015-03-15 MED ORDER — LOSARTAN POTASSIUM 100 MG PO TABS: 100.0000 mg | ORAL_TABLET | Freq: Every day | ORAL | Status: DC

## 2015-03-15 MED ORDER — METOPROLOL SUCCINATE ER 50 MG PO TB24: ORAL_TABLET | ORAL | Status: DC

## 2015-03-15 NOTE — Progress Notes (Signed)
HPI The patient presents for follow up of coronary disease with previous stent when he was living in [place].  He returns for follow up.  He has done well.  The patient denies any new symptoms such as chest discomfort, neck or arm discomfort. There has been no new shortness of breath, PND or orthopnea. There have been no reported palpitations, presyncope or syncope.  He now is working as a delivery person.   No Known Allergies  Current Outpatient Prescriptions  Medication Sig Dispense Refill  . Aclidinium Bromide (TUDORZA PRESSAIR) 400 MCG/ACT AEPB Inhale 1 puff into the lungs 2 (two) times daily. 1 each 11  . aspirin EC 81 MG tablet Take 81 mg by mouth daily.    . hydrochlorothiazide (MICROZIDE) 12.5 MG capsule Take 25 mg by mouth daily.    . Insulin Detemir (LEVEMIR FLEXTOUCH) 100 UNIT/ML Pen Inject 30 Units into the skin daily at 10 pm. 15 pen 3  . Insulin Glargine (LANTUS SOLOSTAR) 100 UNIT/ML Solostar Pen Inject 30 Units into the skin daily at 10 pm. 15 mL 11  . Insulin Pen Needle (BD PEN NEEDLE NANO U/F) 32G X 4 MM MISC use as directed with INSULIN 100 each 4  . losartan (COZAAR) 100 MG tablet Take 1 tablet (100 mg total) by mouth daily before breakfast. 90 tablet 1  . metFORMIN (GLUCOPHAGE) 1000 MG tablet Take 1,000 mg by mouth 2 (two) times daily with a meal.    . metoprolol succinate (TOPROL-XL) 50 MG 24 hr tablet TAKE 1 TABLET BY MOUTH EVERY DAY WITH OR IMMEDIATELY FOLLOWING A MEAL 90 tablet 1  . mometasone (NASONEX) 50 MCG/ACT nasal spray Place 2 sprays into the nose daily. 17 g 0  . rosuvastatin (CRESTOR) 20 MG tablet Take 1 tablet (20 mg total) by mouth daily. 90 tablet 3  . tamsulosin (FLOMAX) 0.4 MG CAPS capsule Take 1 capsule (0.4 mg total) by mouth daily after supper. 90 capsule 3  . traMADol (ULTRAM) 50 MG tablet Take 1 tablet (50 mg total) by mouth every 8 (eight) hours as needed. 65 tablet 2   No current facility-administered medications for this visit.    Past  Medical History  Diagnosis Date  . Coronary artery disease     Lexiscan Myoview 7/14:  Inf scar with minimal reversibility, inf HK, EF 45%;  Low risk.  Promus LAD stent placed in [place].  100% RCA  . Diabetes mellitus without complication   . Dyslipidemia   . Hypertension   . Peripheral vascular disease     a. s/p R fem-pop BPG 01/2013 (Dr. [name])  . Hernia   . Allergy   . Hyperlipidemia   . Sickle cell anemia     has trait  . GERD (gastroesophageal reflux disease)     has used Zantac in the past   . Arthritis     Right Hip  . Atrial fibrillation   . COPD (chronic obstructive pulmonary disease)     Past Surgical History  Procedure Laterality Date  . Cardiac catheterization      with stent placement  . Inguinal hernia repair  2010    Right  . Colonoscopy  March 2014  . Femoral-popliteal bypass graft Right 02/07/2013    Procedure: BYPASS GRAFT FEMORAL-POPLITEAL ARTERY- RIGHT;  Surgeon: Mal Misty, MD;  Location: [facility] OR;  Service: Vascular;  Laterality: Right;  Right femoral to below knee popliteal bypass using right non reversed great saphenous vein  . Intraoperative arteriogram Right 02/07/2013  Procedure: Attempted INTRA OPERATIVE ARTERIOGRAM;  Surgeon: Mal Misty, MD;  Location: [facility];  Service: Vascular;  Laterality: Right;  . Abdominal aortagram N/A 12/10/2012    Procedure: ABDOMINAL Maxcine Ham;  Surgeon: Serafina Mitchell, MD;  Location: [facility] CATH LAB;  Service: Cardiovascular;  Laterality: N/A;    ROS:  As stated in the HPI and negative for all other systems.   PHYSICAL EXAM BP 130/72 mmHg  Pulse 69  Ht 5\' 5"  (1.651 m)  Wt 137 lb 8 oz (62.37 kg)  BMI 22.88 kg/m2 GENERAL:  Well appearing NECK:  No jugular venous distention, waveform within normal limits, carotid upstroke brisk and symmetric, no bruits, no thyromegaly LUNGS:  Clear to auscultation bilaterally BACK:  No CVA tenderness CHEST:  Unremarkable HEART:  PMI not displaced or sustained,S1 and S2 within  normal limits, no S3, no S4, no clicks, no rubs, no murmurs ABD:  Flat, positive bowel sounds normal in frequency in pitch, no bruits, no rebound, no guarding, no midline pulsatile mass, no hepatomegaly, no splenomegaly EXT:  2 plus pulses upper, diminished dorsalis pedis and posterior tibialis bilaterally, no edema, no cyanosis no clubbing  EKG:  Sinus bradycardia, rate 69, axis within normal limits, old inferior infarct, nonspecific lateral T-wave changes, old inferior infarct.  03/15/2015   ASSESSMENT AND PLAN  CAD  The patient is not having active symptoms.  He will continue with aggressive risk reduction.   Dyslipidemia His recent lipid profile was not as good as previous. However, he reports he might of been off his statin and this has been restarted. I will defer follow-up to Dr. Ronnald Ramp.   HTN The blood pressure is at target. No change in medications is indicated. We will continue with therapeutic lifestyle changes (TLC).  CKD I do not see creatinine was 1.7.  This was better than previous.  He and I discussed this. . I will defer to Scarlette Calico, MD  DM His hemoglobin A1c was 7.  He is now on insulin.

## 2015-03-15 NOTE — Patient Instructions (Signed)
Your physician wants you to follow-up in: 1 year or sooner if needed with Dr. Percival Spanish. You will receive a reminder letter in the mail two months in advance. If you don't receive a letter, please call our office to schedule the follow-up appointment.

## 2015-03-22 DIAGNOSIS — E1051 Type 1 diabetes mellitus with diabetic peripheral angiopathy without gangrene: Secondary | ICD-10-CM | POA: Diagnosis not present

## 2015-03-22 DIAGNOSIS — I739 Peripheral vascular disease, unspecified: Secondary | ICD-10-CM | POA: Diagnosis not present

## 2015-03-22 DIAGNOSIS — L603 Nail dystrophy: Secondary | ICD-10-CM | POA: Diagnosis not present

## 2015-03-22 DIAGNOSIS — L84 Corns and callosities: Secondary | ICD-10-CM | POA: Diagnosis not present

## 2015-03-29 ENCOUNTER — Other Ambulatory Visit: Payer: Self-pay | Admitting: Cardiology

## 2015-04-20 ENCOUNTER — Other Ambulatory Visit (HOSPITAL_COMMUNITY): Payer: Self-pay

## 2015-04-20 ENCOUNTER — Encounter (HOSPITAL_COMMUNITY): Payer: Self-pay

## 2015-04-20 ENCOUNTER — Ambulatory Visit: Payer: Self-pay | Admitting: Vascular Surgery

## 2015-05-25 ENCOUNTER — Other Ambulatory Visit: Payer: Self-pay | Admitting: Internal Medicine

## 2015-06-15 ENCOUNTER — Telehealth: Payer: Self-pay

## 2015-06-15 NOTE — Telephone Encounter (Signed)
Steven [name] pt regarding diabetic shoe papmplet dropped off. He is overdue for a foot exam which is needed for Ov and pamphlet. He states he does not want to schedule an OV right now and will call back later

## 2015-06-24 DIAGNOSIS — I739 Peripheral vascular disease, unspecified: Secondary | ICD-10-CM | POA: Diagnosis not present

## 2015-06-24 DIAGNOSIS — L84 Corns and callosities: Secondary | ICD-10-CM | POA: Diagnosis not present

## 2015-06-24 DIAGNOSIS — E1051 Type 1 diabetes mellitus with diabetic peripheral angiopathy without gangrene: Secondary | ICD-10-CM | POA: Diagnosis not present

## 2015-06-24 DIAGNOSIS — L603 Nail dystrophy: Secondary | ICD-10-CM | POA: Diagnosis not present

## 2015-07-03 ENCOUNTER — Other Ambulatory Visit: Payer: Self-pay | Admitting: Cardiology

## 2015-07-05 NOTE — Telephone Encounter (Signed)
REFILL 

## 2015-07-13 ENCOUNTER — Other Ambulatory Visit (INDEPENDENT_AMBULATORY_CARE_PROVIDER_SITE_OTHER): Payer: Medicare Other

## 2015-07-13 ENCOUNTER — Encounter: Payer: Self-pay | Admitting: Internal Medicine

## 2015-07-13 ENCOUNTER — Ambulatory Visit (INDEPENDENT_AMBULATORY_CARE_PROVIDER_SITE_OTHER): Payer: Medicare Other | Admitting: Internal Medicine

## 2015-07-13 VITALS — BP 148/70 | HR 80 | Temp 98.3°F | Resp 16 | Ht 65.0 in | Wt 142.0 lb

## 2015-07-13 DIAGNOSIS — I739 Peripheral vascular disease, unspecified: Secondary | ICD-10-CM

## 2015-07-13 DIAGNOSIS — Z87891 Personal history of nicotine dependence: Secondary | ICD-10-CM

## 2015-07-13 DIAGNOSIS — Z23 Encounter for immunization: Secondary | ICD-10-CM | POA: Diagnosis not present

## 2015-07-13 DIAGNOSIS — N181 Chronic kidney disease, stage 1: Secondary | ICD-10-CM

## 2015-07-13 DIAGNOSIS — I70212 Atherosclerosis of native arteries of extremities with intermittent claudication, left leg: Secondary | ICD-10-CM | POA: Diagnosis not present

## 2015-07-13 DIAGNOSIS — E785 Hyperlipidemia, unspecified: Secondary | ICD-10-CM | POA: Diagnosis not present

## 2015-07-13 DIAGNOSIS — I70219 Atherosclerosis of native arteries of extremities with intermittent claudication, unspecified extremity: Secondary | ICD-10-CM

## 2015-07-13 DIAGNOSIS — N2889 Other specified disorders of kidney and ureter: Secondary | ICD-10-CM

## 2015-07-13 DIAGNOSIS — Z95828 Presence of other vascular implants and grafts: Secondary | ICD-10-CM

## 2015-07-13 DIAGNOSIS — Z48812 Encounter for surgical aftercare following surgery on the circulatory system: Secondary | ICD-10-CM

## 2015-07-13 DIAGNOSIS — E139 Other specified diabetes mellitus without complications: Secondary | ICD-10-CM

## 2015-07-13 DIAGNOSIS — I2510 Atherosclerotic heart disease of native coronary artery without angina pectoris: Secondary | ICD-10-CM

## 2015-07-13 LAB — COMPREHENSIVE METABOLIC PANEL
ALBUMIN: 4 g/dL (ref 3.5–5.2)
ALT: 15 U/L (ref 0–53)
AST: 19 U/L (ref 0–37)
Alkaline Phosphatase: 65 U/L (ref 39–117)
BUN: 26 mg/dL — AB (ref 6–23)
CHLORIDE: 99 meq/L (ref 96–112)
CO2: 26 mEq/L (ref 19–32)
CREATININE: 1.73 mg/dL — AB (ref 0.40–1.50)
Calcium: 10 mg/dL (ref 8.4–10.5)
GFR: 50.88 mL/min — ABNORMAL LOW (ref >60.00)
Glucose, Bld: 319 mg/dL — ABNORMAL HIGH (ref 70–99)
Potassium: 3.8 mEq/L (ref 3.5–5.1)
SODIUM: 135 meq/L (ref 135–145)
Total Bilirubin: 0.4 mg/dL (ref 0.2–1.2)
Total Protein: 7 g/dL (ref 6.0–8.3)

## 2015-07-13 LAB — URINALYSIS, ROUTINE W REFLEX MICROSCOPIC
Bilirubin Urine: NEGATIVE
Ketones, ur: NEGATIVE
Leukocytes, UA: NEGATIVE
NITRITE: NEGATIVE
SPECIFIC GRAVITY, URINE: 1.015 (ref 1.000–1.030)
Total Protein, Urine: 30 — AB
URINE GLUCOSE: 500 — AB
Urobilinogen, UA: 0.2 (ref 0.0–1.0)
WBC UA: NONE SEEN (ref 0–?)
pH: 5.5 (ref 5.0–8.0)

## 2015-07-13 LAB — LIPID PANEL
CHOLESTEROL: 167 mg/dL (ref 0–200)
HDL: 57.6 mg/dL (ref >39.00)
LDL CALC: 84 mg/dL (ref 0–99)
NonHDL: 109.49
Total CHOL/HDL Ratio: 3
Triglycerides: 127 mg/dL (ref 0.0–149.0)
VLDL: 25.4 mg/dL (ref 0.0–40.0)

## 2015-07-13 LAB — HEMOGLOBIN A1C: HEMOGLOBIN A1C: 8.4 % — AB (ref 4.6–6.5)

## 2015-07-13 NOTE — Progress Notes (Signed)
Pre visit review using our clinic review tool, if applicable. No additional management support is needed unless otherwise documented below in the visit note. 

## 2015-07-13 NOTE — Progress Notes (Signed)
Subjective:  Patient ID: Steven [name], male    DOB: 04-29-1946  Age: 69 y.o. MRN: [id number]  CC: Diabetes and Hyperlipidemia   HPI Steven [name] presents for follow-up on diabetes. He feels well and offers no complaints.  Outpatient Prescriptions Prior to Visit  Medication Sig Dispense Refill  . Aclidinium Bromide (TUDORZA PRESSAIR) 400 MCG/ACT AEPB Inhale 1 puff into the lungs 2 (two) times daily. 1 each 11  . aspirin EC 81 MG tablet Take 81 mg by mouth daily.    . hydrochlorothiazide (MICROZIDE) 12.5 MG capsule TAKE 2 CAPSULES BY MOUTH EVERY DAY 180 capsule 3  . Insulin Detemir (LEVEMIR FLEXTOUCH) 100 UNIT/ML Pen Inject 30 Units into the skin daily at 10 pm. 15 pen 3  . Insulin Pen Needle (BD PEN NEEDLE NANO U/F) 32G X 4 MM MISC use as directed with INSULIN 100 each 4  . losartan (COZAAR) 100 MG tablet Take 1 tablet (100 mg total) by mouth daily before breakfast. 30 tablet 11  . metFORMIN (GLUCOPHAGE) 1000 MG tablet TAKE 1 TABLET BY MOUTH TWICE DAILY WITH MEALS 180 tablet 0  . metoprolol succinate (TOPROL-XL) 50 MG 24 hr tablet TAKE 1 TABLET BY MOUTH EVERY DAY WITH OR IMMEDIATELY FOLLOWING A MEAL 30 tablet 11  . mometasone (NASONEX) 50 MCG/ACT nasal spray Place 2 sprays into the nose daily. 17 g 0  . rosuvastatin (CRESTOR) 20 MG tablet Take 1 tablet (20 mg total) by mouth daily. 90 tablet 3  . tamsulosin (FLOMAX) 0.4 MG CAPS capsule Take 1 capsule (0.4 mg total) by mouth daily after supper. 90 capsule 3  . traMADol (ULTRAM) 50 MG tablet Take 1 tablet (50 mg total) by mouth every 8 (eight) hours as needed. 65 tablet 2  . Insulin Glargine (LANTUS SOLOSTAR) 100 UNIT/ML Solostar Pen Inject 30 Units into the skin daily at 10 pm. 15 mL 11  . metFORMIN (GLUCOPHAGE) 1000 MG tablet Take 1,000 mg by mouth 2 (two) times daily with a meal.     No facility-administered medications prior to visit.    ROS Review of Systems  Constitutional: Negative.  Negative for fever, chills, diaphoresis,  appetite change and fatigue.  HENT: Negative.   Eyes: Negative.   Respiratory: Negative.  Negative for cough, choking, chest tightness, shortness of breath and stridor.   Cardiovascular: Negative.  Negative for chest pain, palpitations and leg swelling.  Gastrointestinal: Negative.  Negative for nausea, vomiting, abdominal pain, diarrhea, constipation and blood in stool.  Endocrine: Negative.  Negative for polydipsia, polyphagia and polyuria.  Genitourinary: Negative.   Musculoskeletal: Negative.  Negative for myalgias, back pain, joint swelling and neck pain.  Skin: Negative for color change and rash.  Allergic/Immunologic: Negative.   Neurological: Negative.  Negative for dizziness, syncope, speech difficulty, light-headedness, numbness and headaches.  Hematological: Negative.  Negative for adenopathy. Does not bruise/bleed easily.  Psychiatric/Behavioral: Negative.     Objective:  BP 148/70 mmHg  Pulse 80  Temp(Src) 98.3 F (36.8 C) (Oral)  Resp 16  Ht 5\' 5"  (1.651 m)  Wt 142 lb (64.411 kg)  BMI 23.63 kg/m2  SpO2 97%  BP Readings from Last 3 Encounters:  07/13/15 148/70  03/15/15 130/72  01/11/15 133/73    Wt Readings from Last 3 Encounters:  07/13/15 142 lb (64.411 kg)  03/15/15 137 lb 8 oz (62.37 kg)  01/11/15 132 lb (59.875 kg)    Physical Exam  Constitutional: He is oriented to person, place, and time. No distress.  HENT:  Mouth/Throat: Oropharynx is clear and moist. No oropharyngeal exudate.  Eyes: Conjunctivae are normal. Right eye exhibits no discharge. Left eye exhibits no discharge. No scleral icterus.  Neck: Normal range of motion. Neck supple. No JVD present. No tracheal deviation present. No thyromegaly present.  Cardiovascular: Normal rate, regular rhythm, normal heart sounds and intact distal pulses.  Exam reveals no gallop and no friction rub.   No murmur heard. Pulmonary/Chest: Effort normal and breath sounds normal. No stridor. No respiratory  distress. He has no wheezes. He has no rales. He exhibits no tenderness.  Abdominal: Soft. Bowel sounds are normal. He exhibits no distension and no mass. There is no tenderness. There is no rebound and no guarding.  Musculoskeletal: Normal range of motion. He exhibits no edema or tenderness.  Lymphadenopathy:    He has no cervical adenopathy.  Neurological: He is oriented to person, place, and time.  Skin: Skin is warm and dry. No rash noted. He is not diaphoretic. No erythema. No pallor.  There is callus formation over both feet  Vitals reviewed.   Lab Results  Component Value Date   WBC 7.0 12/29/2014   HGB 14.4 12/29/2014   HCT 42.1 12/29/2014   PLT 170.0 12/29/2014   GLUCOSE 319* 07/13/2015   CHOL 167 07/13/2015   TRIG 127.0 07/13/2015   HDL 57.60 07/13/2015   LDLCALC 84 07/13/2015   ALT 15 07/13/2015   AST 19 07/13/2015   NA 135 07/13/2015   K 3.8 07/13/2015   CL 99 07/13/2015   CREATININE 1.73* 07/13/2015   BUN 26* 07/13/2015   CO2 26 07/13/2015   TSH 1.08 12/29/2014   PSA 3.76 12/29/2014   INR 1.07 02/03/2013   HGBA1C 8.4* 07/13/2015   MICROALBUR 9.1* 12/29/2014    No results found.  Assessment & Plan:   Steven [name] was seen today for diabetes and hyperlipidemia.  Diagnoses and all orders for this visit:  Coronary artery disease involving native coronary artery of native heart without angina pectoris- he has not had any recent episodes of angina, will continue to control his LDL cholesterol, blood sugar and blood pressure. -     Lipid panel; Future  Atherosclerotic PVD with intermittent claudication ([name]) -     Lipid panel; Future  Diabetes 1.5, managed as type 1 ([place])- his A1c is up 8.4%, his insurance has changed the preferred insulin to Toujeo so he will stop Levemir and Lantus and switch to Toujeo and will increase the dose from 30 units a day to 50 units a day to get better blood sugar control, will also restart metformin -     Lipid panel; Future -      Comprehensive metabolic panel; Future -     Hemoglobin A1c; Future  Chronic renal insufficiency, stage 1- renal function is stable -     Comprehensive metabolic panel; Future -     Urinalysis, Routine w reflex microscopic (not at [facility]); Future  Hyperlipidemia with target LDL less than 70- he has not achieved his LDL goal, he is doing well on Crestor, will add Zetia for additional reduction in his LDL level and to reduce his risk factors for MI and stroke -     Lipid panel; Future -     Comprehensive metabolic panel; Future  Encounter for immunization  Other orders -     Flu Vaccine QUAD 36+ mos IM  I have discontinued Mr. Hodsdon Insulin Glargine. I am also having him maintain his aspirin EC, Aclidinium Bromide, Insulin  Pen Needle, traMADol, mometasone, tamsulosin, rosuvastatin, Insulin Detemir, metoprolol succinate, losartan, metFORMIN, and hydrochlorothiazide.  No orders of the defined types were placed in this encounter.     Follow-up: Return in about 4 months (around 11/11/2015).  Scarlette Calico, MD

## 2015-07-13 NOTE — Patient Instructions (Signed)
Type 1 Diabetes Mellitus, Adult Type 1 diabetes mellitus, often simply referred to as diabetes, is a long-term (chronic) disease. It occurs when the islet cells in the pancreas that make insulin (a hormone) are destroyed and can no longer make insulin. Insulin is needed to move sugars from food into the tissue cells. The tissue cells use the sugars for energy. In people with type 1 diabetes, the sugars build up in the blood instead of going into the tissue cells. As a result, high blood sugar (hyperglycemia) develops. Without insulin, the body breaks down fat cells for the needed energy. This breakdown of fat cells produces acid chemicals (ketones), which increases the acid levels in the body. The effect of either high ketone or high sugar (glucose) levels can be life-threatening.  Type 1 diabetes was also previously called juvenile diabetes. It most often occurs before the age of 30, but it can occur at any age. RISK FACTORS A person is predisposed to developing type 1 diabetes if someone in his or her family has the disease and is exposed to certain additional environmental triggers.  SYMPTOMS  Symptoms of type 1 diabetes may develop gradually over days to weeks or suddenly. The symptoms occur due to hyperglycemia. The symptoms can include:   Increased thirst (polydipsia).  Increased urination (polyuria).  Increased urination during the night (nocturia).  Weight loss. This weight loss may be rapid.  Frequent, recurring infections.  Tiredness (fatigue).  Weakness.  Vision changes, such as blurred vision.  Fruity smell to your breath.  Abdominal pain.  Nausea or vomiting.  An open skin wound (ulcer). DIAGNOSIS  Type 1 diabetes is diagnosed when symptoms of diabetes are present and when blood glucose levels are increased. Your blood glucose level may be checked by one or more of the following blood tests:  A fasting blood glucose test. You will not be allowed to eat for at least 8  hours before a blood sample is taken.  A random blood glucose test. Your blood glucose is checked at any time of the day regardless of when you ate.  A hemoglobin A1c blood glucose test. A hemoglobin A1c test provides information about blood glucose control over the previous 3 months. TREATMENT  Although type 1 diabetes cannot be prevented, it can be managed with insulin, diet, and exercise.  You will need to take insulin daily to keep blood glucose in the desired range.  You will need to match insulin dosing with exercise and healthy food choices. Generally, the goal of treatment is to maintain a pre-meal (preprandial) blood glucose level of 80-130 mg/dL. HOME CARE INSTRUCTIONS   Have your hemoglobin A1c level checked twice a year.  Perform daily blood glucose monitoring as directed by your health care provider.  Monitor urine ketones when you are ill and as directed by your health care provider.  Take your insulin as directed by your health care provider to maintain your blood glucose level in the desired range.  Never run out of insulin. It is needed every day.  Adjust insulin based on your intake of carbohydrates. Carbohydrates can raise blood glucose levels but need to be included in your diet. Carbohydrates provide vitamins, minerals, and fiber, which are an essential part of a healthy diet. Carbohydrates are found in fruits, vegetables, whole grains, dairy products, legumes, and foods containing added sugars.  Eat healthy foods. Alternate 3 meals with 3 snacks.  Maintain a healthy weight.  Carry a medical alert card or wear your medical alert   jewelry.  Carry a 15-gram carbohydrate snack with you at all times to treat low blood glucose (hypoglycemia). Some examples of 15-gram carbohydrate snacks include:  Glucose tablets, 3 or 4.  Glucose gel, 15-gram tube.  Raisins, 2 tablespoons (24 grams).  Jelly beans, 6.  Animal crackers, 8.  Fruit juice, regular soda, or  low-fat milk, 4 ounces (120 mL).  Gummy treats, 9.  Recognize hypoglycemia. Hypoglycemia occurs with blood glucose levels of 70 mg/dL and below. The risk for hypoglycemia increases when fasting or skipping meals, during or after intense exercise, and during sleep. Hypoglycemia symptoms can include:  Tremors or shakes.  Decreased ability to concentrate.  Sweating.  Increased heart rate.  Headache.  Dry mouth.  Hunger.  Irritability.  Anxiety.  Restless sleep.  Altered speech or coordination.  Confusion.  Treat hypoglycemia promptly. If you are alert and able to safely swallow, follow the 15:15 rule:  Take 15-20 grams of rapid-acting glucose or carbohydrate. Rapid-acting options include glucose gel, glucose tablets, or 4 ounces (120 mL) of fruit juice, regular soda, or low-fat milk.  Check your blood glucose level 15 minutes after taking the glucose.  Take 15-20 grams more of glucose if the repeat blood glucose level is still 70 mg/dL or below.  Eat a meal or snack within 1 hour once blood glucose levels return to normal.  Be alert to polyuria and polydipsia, which are early signs of hyperglycemia. An early awareness of hyperglycemia allows for prompt treatment. Treat hyperglycemia as directed by your health care provider.  Exercise regularly as directed by your health care provider. This includes:  Stretching and performing strength training exercises, such as yoga or weight lifting, at least 2 times per week.  Performing a total of at least 150 minutes of moderate-intensity exercise each week, such as brisk walking or water aerobics.  Exercising at least 3 days per week, making sure you allow no more than 2 consecutive days to pass without exercising.  Avoiding long periods of inactivity (90 minutes or more). When you have to spend an extended period of time sitting down, take frequent breaks to walk or stretch.  Adjust your insulin dosing and food intake as needed  if you start a new exercise or sport.  Follow your sick-day plan at any time you are unable to eat or drink as usual.   Do not use any tobacco products including cigarettes, chewing tobacco, or electronic cigarettes. If you need help quitting, ask your health care provider.  Limit alcohol intake to no more than 1 drink per day for nonpregnant women and 2 drinks per day for men. You should drink alcohol only when you are also eating food. Talk with your health care provider about whether alcohol is safe for you. Tell your health care provider if you drink alcohol several times a week.  Keep all follow-up visits as directed by your health care provider.  Schedule an eye exam within 5 years of diagnosis and then annually.  Perform daily skin and foot care. Examine your skin and feet daily for cuts, bruises, redness, nail problems, bleeding, blisters, or sores. A foot exam should be done by a health care provider 5 years after diagnosis, and then every year after the first exam.  Brush your teeth and gums at least twice a day and floss at least once a day. Follow up with your dentist regularly.  Share your diabetes management plan with your workplace or school.  Keep your immunizations up to date. It   is recommended that you receive a flu (influenza) vaccine every year. It is also recommended that you receive a pneumonia (pneumococcal) vaccine. If you are 65 years of age or older and have never received a pneumonia vaccine, this vaccine may be given as a series of two separate shots. Ask your health care provider which additional vaccines may be recommended.  Learn to manage stress.  Obtain ongoing diabetes education and support as needed.  Participate in or seek rehabilitation as needed to maintain or improve independence and quality of life. Request a physical or occupational therapy referral if you are having foot or hand numbness, or difficulties with grooming, dressing, eating, or physical  activity. SEEK MEDICAL CARE IF:   You are unable to eat food or drink fluids for more than 6 hours.  You have nausea and vomiting for more than 6 hours.  Your blood glucose level is over 240 mg/dL.  There is a change in mental status.  You develop an additional serious illness.  You have diarrhea for more than 6 hours.  You have been sick or have had a fever for a couple of days and are not getting better.  You have pain during any physical activity. SEEK IMMEDIATE MEDICAL CARE IF:  You have difficulty breathing.  You have moderate to large ketone levels. MAKE SURE YOU:  Understand these instructions.  Will watch your condition.  Will get help right away if you are not doing well or get worse.   This information is not intended to replace advice given to you by your health care provider. Make sure you discuss any questions you have with your health care provider.   Document Released: 07/07/2000 Document Revised: 03/31/2015 Document Reviewed: 02/06/2012 Elsevier Interactive Patient Education 2016 Elsevier Inc.  

## 2015-07-15 MED ORDER — EZETIMIBE 10 MG PO TABS: 10.0000 mg | ORAL_TABLET | Freq: Every day | ORAL | Status: DC

## 2015-07-15 MED ORDER — INSULIN GLARGINE 300 UNIT/ML ~~LOC~~ SOPN: 50.0000 [IU] | PEN_INJECTOR | Freq: Every day | SUBCUTANEOUS | Status: DC

## 2015-07-15 MED ORDER — METFORMIN HCL 1000 MG PO TABS: 1000.0000 mg | ORAL_TABLET | Freq: Two times a day (BID) | ORAL | Status: DC

## 2015-08-26 ENCOUNTER — Encounter (HOSPITAL_COMMUNITY): Payer: Self-pay | Admitting: *Deleted

## 2015-08-26 ENCOUNTER — Emergency Department (HOSPITAL_COMMUNITY)
Admission: EM | Admit: 2015-08-26 | Discharge: 2015-08-26 | Disposition: A | Discharge status: Home / Self Care | Payer: Worker's Compensation | Attending: Emergency Medicine | Admitting: Emergency Medicine

## 2015-08-26 DIAGNOSIS — Z951 Presence of aortocoronary bypass graft: Secondary | ICD-10-CM | POA: Diagnosis not present

## 2015-08-26 DIAGNOSIS — I2510 Atherosclerotic heart disease of native coronary artery without angina pectoris: Secondary | ICD-10-CM | POA: Insufficient documentation

## 2015-08-26 DIAGNOSIS — I10 Essential (primary) hypertension: Secondary | ICD-10-CM | POA: Insufficient documentation

## 2015-08-26 DIAGNOSIS — Z9861 Coronary angioplasty status: Secondary | ICD-10-CM | POA: Diagnosis not present

## 2015-08-26 DIAGNOSIS — Z9889 Other specified postprocedural states: Secondary | ICD-10-CM | POA: Insufficient documentation

## 2015-08-26 DIAGNOSIS — Y998 Other external cause status: Secondary | ICD-10-CM | POA: Insufficient documentation

## 2015-08-26 DIAGNOSIS — Z794 Long term (current) use of insulin: Secondary | ICD-10-CM | POA: Diagnosis not present

## 2015-08-26 DIAGNOSIS — Z7951 Long term (current) use of inhaled steroids: Secondary | ICD-10-CM | POA: Diagnosis not present

## 2015-08-26 DIAGNOSIS — Z87891 Personal history of nicotine dependence: Secondary | ICD-10-CM | POA: Insufficient documentation

## 2015-08-26 DIAGNOSIS — Y9289 Other specified places as the place of occurrence of the external cause: Secondary | ICD-10-CM | POA: Insufficient documentation

## 2015-08-26 DIAGNOSIS — Y9389 Activity, other specified: Secondary | ICD-10-CM | POA: Insufficient documentation

## 2015-08-26 DIAGNOSIS — E119 Type 2 diabetes mellitus without complications: Secondary | ICD-10-CM | POA: Insufficient documentation

## 2015-08-26 DIAGNOSIS — Z8719 Personal history of other diseases of the digestive system: Secondary | ICD-10-CM | POA: Insufficient documentation

## 2015-08-26 DIAGNOSIS — E785 Hyperlipidemia, unspecified: Secondary | ICD-10-CM | POA: Diagnosis not present

## 2015-08-26 DIAGNOSIS — J449 Chronic obstructive pulmonary disease, unspecified: Secondary | ICD-10-CM | POA: Insufficient documentation

## 2015-08-26 DIAGNOSIS — Z79899 Other long term (current) drug therapy: Secondary | ICD-10-CM | POA: Diagnosis not present

## 2015-08-26 DIAGNOSIS — I4891 Unspecified atrial fibrillation: Secondary | ICD-10-CM | POA: Diagnosis not present

## 2015-08-26 DIAGNOSIS — W268XXA Contact with other sharp object(s), not elsewhere classified, initial encounter: Secondary | ICD-10-CM | POA: Insufficient documentation

## 2015-08-26 DIAGNOSIS — Z7982 Long term (current) use of aspirin: Secondary | ICD-10-CM | POA: Diagnosis not present

## 2015-08-26 DIAGNOSIS — M1990 Unspecified osteoarthritis, unspecified site: Secondary | ICD-10-CM | POA: Diagnosis not present

## 2015-08-26 DIAGNOSIS — Z862 Personal history of diseases of the blood and blood-forming organs and certain disorders involving the immune mechanism: Secondary | ICD-10-CM | POA: Insufficient documentation

## 2015-08-26 DIAGNOSIS — S81811A Laceration without foreign body, right lower leg, initial encounter: Secondary | ICD-10-CM | POA: Diagnosis not present

## 2015-08-26 DIAGNOSIS — Z7984 Long term (current) use of oral hypoglycemic drugs: Secondary | ICD-10-CM | POA: Insufficient documentation

## 2015-08-26 DIAGNOSIS — T148XXA Other injury of unspecified body region, initial encounter: Secondary | ICD-10-CM

## 2015-08-26 NOTE — Discharge Instructions (Signed)
Tissue Adhesive Wound Care  ° °Some cuts, wounds, lacerations, and incisions can be repaired by using tissue adhesive. Tissue adhesive is like glue. It holds the skin together, allowing for faster healing. It forms a strong bond on the skin in about 1 minute and reaches its full strength in about 2 or 3 minutes. The adhesive disappears naturally while the wound is healing. It is important to take proper care of your wound at home while it heals.  °HOME CARE INSTRUCTIONS  °Showers are allowed. Do not soak the area containing the tissue adhesive. Do not take baths, swim, or use hot tubs. Do not use any soaps or ointments on the wound. Certain ointments can weaken the glue.  °If a bandage (dressing) has been applied, follow your health care provider's instructions for how often to change the dressing.  °Keep the dressing dry if one has been applied.  °Do not scratch, pick, or rub the adhesive.  °Do not place tape over the adhesive. The adhesive could come off when pulling the tape off.  °Protect the wound from further injury until it is healed.  °Protect the wound from sun and tanning bed exposure while it is healing and for several weeks after healing.  °Only take over-the-counter or prescription medicines as directed by your health care provider.  °Keep all follow-up appointments as directed by your health care provider. °SEEK IMMEDIATE MEDICAL CARE IF:  °Your wound becomes red, swollen, hot, or tender.  °You develop a rash after the glue is applied.  °You have increasing pain in the wound.  °You have a red streak that goes away from the wound.  °You have pus coming from the wound.  °You have increased bleeding.  °You have a fever.  °You have shaking chills.  °You notice a bad smell coming from the wound.  °Your wound or adhesive breaks open.  °MAKE SURE YOU:  °Understand these instructions.  °Will watch your condition.  °Will get help right away if you are not doing well or get worse. °This information is not  intended to replace advice given to you by your health care provider. Make sure you discuss any questions you have with your health care provider.  °Document Released: 01/03/2001 Document Revised: 04/30/2013 Document Reviewed: 01/29/2013  °Elsevier Interactive Patient Education ©2016 Elsevier Inc.  ° °

## 2015-08-26 NOTE — ED Provider Notes (Signed)
CSN: [id number]     Arrival date & time 08/26/15  2113 History  By signing my name below, I, Altamease Oiler, attest that this documentation has been prepared under the direction and in the presence of [employer]. Electronically Signed: Altamease Oiler, ED Scribe. 08/26/2015 10:04 PM   Chief Complaint  Patient presents with  . Laceration    The history is provided by the patient. No language interpreter was used.   Steven [name] is a 70 y.o. male with history of DM who presents to the Emergency Department complaining of a laceration at the right lower leg  with onset this afternoon near 2:30 PM. Pt states that he stepped wrong on the work van and scraped his leg on a hand dolly. Pt states that the wound bled a lot before coming to the ED.  Denies other injury. He is on a daily 81 mg aspirin but no other blood thinner.    Past Medical History  Diagnosis Date  . Coronary artery disease     Lexiscan Myoview 7/14:  Inf scar with minimal reversibility, inf HK, EF 45%;  Low risk.  Promus LAD stent placed in [place].  100% RCA  . Diabetes mellitus without complication ([facility])   . Dyslipidemia   . Hypertension   . Peripheral vascular disease ([place])     a. s/p R fem-pop BPG 01/2013 (Dr. [name])  . Hernia   . Allergy   . Hyperlipidemia   . Sickle cell anemia ([name])     has trait  . GERD (gastroesophageal reflux disease)     has used Zantac in the past   . Arthritis     Right Hip  . Atrial fibrillation ([place])   . COPD (chronic obstructive pulmonary disease) [facility])    Past Surgical History  Procedure Laterality Date  . Cardiac catheterization      with stent placement  . Inguinal hernia repair  2010    Right  . Colonoscopy  March 2014  . Femoral-popliteal bypass graft Right 02/07/2013    Procedure: BYPASS GRAFT FEMORAL-POPLITEAL ARTERY- RIGHT;  Surgeon: Mal Misty, MD;  [facility] OR;  Service: Vascular;  Laterality: Right;  Right femoral to below knee popliteal bypass using  right non reversed great saphenous vein  . Intraoperative arteriogram Right 02/07/2013    Procedure: Attempted INTRA OPERATIVE ARTERIOGRAM;  Surgeon: Mal Misty, MD;  Location: [place];  Service: Vascular;  Laterality: Right;  . Abdominal aortagram N/A 12/10/2012    Procedure: ABDOMINAL Maxcine Ham;  Surgeon: Serafina Mitchell, MD;  Location: [facility] CATH LAB;  Service: Cardiovascular;  Laterality: N/A;   Family History  Problem Relation Age of Onset  . Diabetes Mother   . Hypertension Mother   . Heart disease Mother   . Hyperlipidemia Mother   . Alcohol abuse Neg Hx   . Cancer Neg Hx   . Depression Neg Hx   . Early death Neg Hx   . Kidney disease Neg Hx   . Stroke Neg Hx   . Colon cancer Neg Hx   . Esophageal cancer Neg Hx   . Rectal cancer Neg Hx   . Stomach cancer Neg Hx   . Diabetes Father   . Heart disease Father   . Hyperlipidemia Father   . Hypertension Father   . Diabetes Sister   . Heart disease Sister   . Hyperlipidemia Sister   . Hypertension Sister   . Diabetes Brother   . Heart disease Brother   .  Hyperlipidemia Brother   . Hypertension Brother   . Diabetes Son   . Heart disease Son   . Hyperlipidemia Son   . Hypertension Son    Social History  Substance Use Topics  . Smoking status: Former Smoker    Types: Cigarettes    Quit date: 02/04/2011  . Smokeless tobacco: Never Used     Comment: Quit tobacco 2 years ago.    . Alcohol Use: 1.8 oz/week    3 Cans of beer per week     Comment: everyday, beer 40 ozs & alcohol- 1-2 shots     Review of Systems  Musculoskeletal: Negative for back pain.  Skin: Positive for wound (right lower leg).    Allergies  Review of patient's allergies indicates no known allergies.  Home Medications   Prior to Admission medications   Medication Sig Start Date End Date Taking? Authorizing Provider  Aclidinium Bromide (TUDORZA PRESSAIR) 400 MCG/ACT AEPB Inhale 1 puff into the lungs 2 (two) times daily. 08/25/13   Janith Lima, MD   aspirin EC 81 MG tablet Take 81 mg by mouth daily.    Historical Provider, MD  ezetimibe (ZETIA) 10 MG tablet Take 1 tablet (10 mg total) by mouth daily. 07/15/15   Janith Lima, MD  hydrochlorothiazide (MICROZIDE) 12.5 MG capsule TAKE 2 CAPSULES BY MOUTH EVERY DAY 07/05/15   Minus Breeding, MD  Insulin Glargine (TOUJEO SOLOSTAR) 300 UNIT/ML SOPN Inject 50 Units into the skin daily. 07/15/15   Janith Lima, MD  Insulin Pen Needle (BD PEN NEEDLE NANO U/F) 32G X 4 MM MISC use as directed with INSULIN 11/10/13   Janith Lima, MD  losartan (COZAAR) 100 MG tablet Take 1 tablet (100 mg total) by mouth daily before breakfast. 03/15/15   Minus Breeding, MD  metFORMIN (GLUCOPHAGE) 1000 MG tablet Take 1 tablet (1,000 mg total) by mouth 2 (two) times daily with a meal. 07/15/15   Janith Lima, MD  metoprolol succinate (TOPROL-XL) 50 MG 24 hr tablet TAKE 1 TABLET BY MOUTH EVERY DAY WITH OR IMMEDIATELY FOLLOWING A MEAL 03/15/15   Minus Breeding, MD  mometasone (NASONEX) 50 MCG/ACT nasal spray Place 2 sprays into the nose daily. 09/12/14   Debbrah Alar, NP  rosuvastatin (CRESTOR) 20 MG tablet Take 1 tablet (20 mg total) by mouth daily. 12/29/14   Janith Lima, MD  tamsulosin (FLOMAX) 0.4 MG CAPS capsule Take 1 capsule (0.4 mg total) by mouth daily after supper. 12/29/14   Janith Lima, MD  traMADol (ULTRAM) 50 MG tablet Take 1 tablet (50 mg total) by mouth every 8 (eight) hours as needed. 01/28/14   Janith Lima, MD   BP 150/57 mmHg  Pulse 80  Temp(Src) 98.2 F (36.8 C)  Resp 16  Ht 5\' 5"  (1.651 m)  Wt 140 lb 9 oz (63.759 kg)  BMI 23.39 kg/m2  SpO2 96% Physical Exam  Constitutional: He is oriented to person, place, and time. He appears well-developed and well-nourished. No distress.  HENT:  Head: Normocephalic and atraumatic.  Eyes: Conjunctivae and EOM are normal.  Neck: Neck supple. No tracheal deviation present.  Cardiovascular: Normal rate.   Pulmonary/Chest: Effort normal. No  respiratory distress.  Musculoskeletal: Normal range of motion.  Neurological: He is alert and oriented to person, place, and time.  Skin: Skin is warm and dry.  Hematoma on the right shin with a 1 cm cut, no active bleeding at this time.   Psychiatric: He has a  normal mood and affect. His behavior is normal.  Nursing note and vitals reviewed.   ED Course  Procedures (including critical care time)  LACERATION REPAIR PROCEDURE NOTE The patient's identification was confirmed and consent was obtained. This procedure was performed by Margarita Mail at 9:49 PM. Site: right shin Sterile procedures observed Closure material: Dermabond Length:1 cm Complexity: Simple Tetanus UTD or ordered Site cleaned with alcohol pads, explored without evidence of foreign body, wound well approximated, site covered with dry, sterile dressing.  Patient tolerated procedure well without complications. Instructions for care discussed verbally and patient provided with additional written instructions for homecare and f/u.  DIAGNOSTIC STUDIES: Oxygen Saturation is 96% on RA,  normal by my interpretation.    COORDINATION OF CARE: 9:44 PM Discussed treatment plan which includes laceration repair with pt at bedside and pt agreed to plan.  Labs Review Labs Reviewed - No data to display  Imaging Review No results found.   EKG Interpretation None      MDM   Final diagnoses:  Bleeding from wound ([place])     Cut occurred < 8 hours prior to repair which was well tolerated. Discussed wound home care w pt and answered questions.  Pt is hemodynamically stable w no complaints prior to dc.     I personally performed the services described in this documentation, which was scribed in my presence. The recorded information has been reviewed and is accurate.       Margarita Mail, PA-C 08/26/15 2311  Ripley Fraise, MD 08/27/15 [phone]

## 2015-08-26 NOTE — ED Notes (Signed)
The pt is c/o a laceration to the rt lower leg  When he struck his leg on a hand dolley 1400 today.  He has taken a shower and he came in here because  The laceration is still bleeding.  He has a bandage on his rt leg  And the bleeding has not  Seeped through

## 2015-10-30 ENCOUNTER — Other Ambulatory Visit: Payer: Self-pay | Admitting: Internal Medicine

## 2015-12-30 DIAGNOSIS — L84 Corns and callosities: Secondary | ICD-10-CM | POA: Diagnosis not present

## 2015-12-30 DIAGNOSIS — L603 Nail dystrophy: Secondary | ICD-10-CM | POA: Diagnosis not present

## 2015-12-30 DIAGNOSIS — I739 Peripheral vascular disease, unspecified: Secondary | ICD-10-CM | POA: Diagnosis not present

## 2015-12-30 DIAGNOSIS — E1051 Type 1 diabetes mellitus with diabetic peripheral angiopathy without gangrene: Secondary | ICD-10-CM | POA: Diagnosis not present

## 2016-01-20 ENCOUNTER — Other Ambulatory Visit: Payer: Self-pay | Admitting: Internal Medicine

## 2016-01-21 ENCOUNTER — Other Ambulatory Visit: Payer: Self-pay | Admitting: Internal Medicine

## 2016-02-07 ENCOUNTER — Encounter: Payer: Self-pay | Admitting: Internal Medicine

## 2016-02-07 ENCOUNTER — Other Ambulatory Visit (INDEPENDENT_AMBULATORY_CARE_PROVIDER_SITE_OTHER): Payer: Medicare Other

## 2016-02-07 ENCOUNTER — Ambulatory Visit (INDEPENDENT_AMBULATORY_CARE_PROVIDER_SITE_OTHER): Payer: Medicare Other | Admitting: Internal Medicine

## 2016-02-07 VITALS — BP 130/58 | HR 63 | Temp 98.8°F | Resp 16 | Ht 65.0 in | Wt 134.0 lb

## 2016-02-07 DIAGNOSIS — I70219 Atherosclerosis of native arteries of extremities with intermittent claudication, unspecified extremity: Secondary | ICD-10-CM

## 2016-02-07 DIAGNOSIS — N400 Enlarged prostate without lower urinary tract symptoms: Secondary | ICD-10-CM

## 2016-02-07 DIAGNOSIS — I10 Essential (primary) hypertension: Secondary | ICD-10-CM

## 2016-02-07 DIAGNOSIS — Z0000 Encounter for general adult medical examination without abnormal findings: Secondary | ICD-10-CM

## 2016-02-07 DIAGNOSIS — E785 Hyperlipidemia, unspecified: Secondary | ICD-10-CM | POA: Diagnosis not present

## 2016-02-07 DIAGNOSIS — I2510 Atherosclerotic heart disease of native coronary artery without angina pectoris: Secondary | ICD-10-CM

## 2016-02-07 DIAGNOSIS — I739 Peripheral vascular disease, unspecified: Secondary | ICD-10-CM | POA: Diagnosis not present

## 2016-02-07 DIAGNOSIS — N182 Chronic kidney disease, stage 2 (mild): Secondary | ICD-10-CM

## 2016-02-07 DIAGNOSIS — E139 Other specified diabetes mellitus without complications: Secondary | ICD-10-CM

## 2016-02-07 LAB — CBC WITH DIFFERENTIAL/PLATELET
BASOS PCT: 1 % (ref 0.0–3.0)
Basophils Absolute: 0.1 10*3/uL (ref 0.0–0.1)
EOS PCT: 1.7 % (ref 0.0–5.0)
Eosinophils Absolute: 0.1 10*3/uL (ref 0.0–0.7)
HCT: 41.1 % (ref 39.0–52.0)
HEMOGLOBIN: 13.8 g/dL (ref 13.0–17.0)
Lymphocytes Relative: 34.4 % (ref 12.0–46.0)
Lymphs Abs: 2 10*3/uL (ref 0.7–4.0)
MCHC: 33.7 g/dL (ref 30.0–36.0)
MCV: 85.5 fl (ref 78.0–100.0)
MONOS PCT: 9 % (ref 3.0–12.0)
Monocytes Absolute: 0.5 10*3/uL (ref 0.1–1.0)
Neutro Abs: 3.1 10*3/uL (ref 1.4–7.7)
Neutrophils Relative %: 53.9 % (ref 43.0–77.0)
Platelets: 166 10*3/uL (ref 150.0–400.0)
RBC: 4.8 Mil/uL (ref 4.22–5.81)
RDW: 15.7 % — AB (ref 11.5–15.5)
WBC: 5.8 10*3/uL (ref 4.0–10.5)

## 2016-02-07 LAB — COMPREHENSIVE METABOLIC PANEL
ALBUMIN: 4.2 g/dL (ref 3.5–5.2)
ALK PHOS: 70 U/L (ref 39–117)
ALT: 9 U/L (ref 0–53)
AST: 15 U/L (ref 0–37)
BUN: 27 mg/dL — AB (ref 6–23)
CHLORIDE: 101 meq/L (ref 96–112)
CO2: 27 mEq/L (ref 19–32)
Calcium: 10.1 mg/dL (ref 8.4–10.5)
Creatinine, Ser: 1.66 mg/dL — ABNORMAL HIGH (ref 0.40–1.50)
GFR: 53.27 mL/min — AB (ref >60.00)
GLUCOSE: 262 mg/dL — AB (ref 70–99)
POTASSIUM: 3.9 meq/L (ref 3.5–5.1)
SODIUM: 137 meq/L (ref 135–145)
TOTAL PROTEIN: 7 g/dL (ref 6.0–8.3)
Total Bilirubin: 0.5 mg/dL (ref 0.2–1.2)

## 2016-02-07 LAB — HEMOGLOBIN A1C: HEMOGLOBIN A1C: 7.9 % — AB (ref 4.6–6.5)

## 2016-02-07 LAB — URINALYSIS, ROUTINE W REFLEX MICROSCOPIC
Bilirubin Urine: NEGATIVE
KETONES UR: NEGATIVE
LEUKOCYTES UA: NEGATIVE
NITRITE: NEGATIVE
PH: 5.5 (ref 5.0–8.0)
SPECIFIC GRAVITY, URINE: 1.01 (ref 1.000–1.030)
Urine Glucose: >=1000 — AB
Urobilinogen, UA: 0.2 (ref 0.0–1.0)

## 2016-02-07 LAB — LIPID PANEL
Cholesterol: 252 mg/dL — ABNORMAL HIGH (ref 0–200)
HDL: 62.3 mg/dL (ref >39.00)
LDL CALC: 156 mg/dL — AB (ref 0–99)
NONHDL: 190.13
Total CHOL/HDL Ratio: 4
Triglycerides: 169 mg/dL — ABNORMAL HIGH (ref 0.0–149.0)
VLDL: 33.8 mg/dL (ref 0.0–40.0)

## 2016-02-07 LAB — FECAL OCCULT BLOOD, GUAIAC: FECAL OCCULT BLD: NEGATIVE

## 2016-02-07 LAB — TSH: TSH: 0.99 u[IU]/mL (ref 0.35–4.50)

## 2016-02-07 LAB — PSA: PSA: 4.68 ng/mL — AB (ref 0.10–4.00)

## 2016-02-07 LAB — MICROALBUMIN / CREATININE URINE RATIO
CREATININE, U: 96.5 mg/dL
MICROALB UR: 10.1 mg/dL — AB (ref 0.0–1.9)
MICROALB/CREAT RATIO: 10.5 mg/g (ref 0.0–30.0)

## 2016-02-07 MED ORDER — ATORVASTATIN CALCIUM 80 MG PO TABS: 80.0000 mg | ORAL_TABLET | Freq: Every day | ORAL | Status: DC

## 2016-02-07 MED ORDER — INSULIN GLARGINE 300 UNIT/ML ~~LOC~~ SOPN: 50.0000 [IU] | PEN_INJECTOR | Freq: Every day | SUBCUTANEOUS | Status: DC

## 2016-02-07 NOTE — Progress Notes (Signed)
Pre visit review using our clinic review tool, if applicable. No additional management support is needed unless otherwise documented below in the visit note. 

## 2016-02-07 NOTE — Progress Notes (Addendum)
Subjective:  Patient ID: Steven [name], male    DOB: [birth year]  Age: 70 y.o. MRN: [id number]  CC: Annual Exam; Hypertension; Hyperlipidemia; and Diabetes   HPI Steven [name] presents for a CPX.  He complains that Crestor is too expensive so he has not been taking it for the last few months. He wants a less expensive option.  He tells me his blood pressure has been well controlled on the combination of losartan, hydrochlorothiazide and metoprolol. He denies any recent episodes of chest pain/shortness of breath/palpitations/dyspnea on exertion/edema/or fatigue.  He only experiences claudication in his lower extremities when he is extremely active for long periods of time. He has had no claudication at rest or worsening pain in his lower extremities.    Past Medical History  Diagnosis Date  . Coronary artery disease     Lexiscan Myoview 7/14:  Inf scar with minimal reversibility, inf HK, EF 45%;  Low risk.  Promus LAD stent placed in [place].  100% RCA  . Diabetes mellitus without complication ([place])   . Dyslipidemia   . Hypertension   . Peripheral vascular disease ([place])     a. s/p R fem-pop BPG 01/2013 (Dr. [name])  . Hernia   . Allergy   . Hyperlipidemia   . Sickle cell anemia ([name])     has trait  . GERD (gastroesophageal reflux disease)     has used Zantac in the past   . Arthritis     Right Hip  . Atrial fibrillation ([place])   . COPD (chronic obstructive pulmonary disease) [facility])    Past Surgical History  Procedure Laterality Date  . Cardiac catheterization      with stent placement  . Inguinal hernia repair  2010    Right  . Colonoscopy  March 2014  . Femoral-popliteal bypass graft Right 02/07/2013    Procedure: BYPASS GRAFT FEMORAL-POPLITEAL ARTERY- RIGHT;  Surgeon: Mal Misty, MD;  Location: [facility] OR;  Service: Vascular;  Laterality: Right;  Right femoral to below knee popliteal bypass using right non reversed great saphenous vein  . Intraoperative arteriogram  Right 02/07/2013    Procedure: Attempted INTRA OPERATIVE ARTERIOGRAM;  Surgeon: Mal Misty, MD;  Location: [place];  Service: Vascular;  Laterality: Right;  . Abdominal aortagram N/A 12/10/2012    Procedure: ABDOMINAL Maxcine Ham;  Surgeon: Serafina Mitchell, MD;  Location: [facility] CATH LAB;  Service: Cardiovascular;  Laterality: N/A;    reports that he quit smoking about 5 years ago. His smoking use included Cigarettes. He has never used smokeless tobacco. He reports that he drinks about 1.8 oz of alcohol per week. He reports that he does not use illicit drugs. family history includes Diabetes in his brother, father, mother, sister, and son; Heart disease in his brother, father, mother, sister, and son; Hyperlipidemia in his brother, father, mother, sister, and son; Hypertension in his brother, father, mother, sister, and son. There is no history of Alcohol abuse, Cancer, Depression, Early death, Kidney disease, Stroke, Colon cancer, Esophageal cancer, Rectal cancer, or Stomach cancer. No Known Allergies  Outpatient Prescriptions Prior to Visit  Medication Sig Dispense Refill  . Aclidinium Bromide (TUDORZA PRESSAIR) 400 MCG/ACT AEPB Inhale 1 puff into the lungs 2 (two) times daily. 1 each 11  . aspirin EC 81 MG tablet Take 81 mg by mouth daily.    Marland Kitchen ezetimibe (ZETIA) 10 MG tablet Take 1 tablet (10 mg total) by mouth daily. 90 tablet 3  . hydrochlorothiazide (MICROZIDE) 12.5 MG  capsule TAKE 2 CAPSULES BY MOUTH EVERY DAY 180 capsule 3  . Insulin Pen Needle (BD PEN NEEDLE NANO U/F) 32G X 4 MM MISC use as directed with INSULIN 100 each 4  . losartan (COZAAR) 100 MG tablet Take 1 tablet (100 mg total) by mouth daily before breakfast. 30 tablet 11  . metFORMIN (GLUCOPHAGE) 1000 MG tablet TAKE 1 TABLET(1000 MG) BY MOUTH TWICE DAILY WITH A MEAL 180 tablet 0  . metoprolol succinate (TOPROL-XL) 50 MG 24 hr tablet TAKE 1 TABLET BY MOUTH EVERY DAY WITH OR IMMEDIATELY FOLLOWING A MEAL 30 tablet 11  . mometasone  (NASONEX) 50 MCG/ACT nasal spray Place 2 sprays into the nose daily. 17 g 0  . tamsulosin (FLOMAX) 0.4 MG CAPS capsule TAKE 1 CAPSULE(0.4 MG) BY MOUTH DAILY AFTER DINNER 90 capsule 0  . traMADol (ULTRAM) 50 MG tablet Take 1 tablet (50 mg total) by mouth every 8 (eight) hours as needed. 65 tablet 2  . CRESTOR 20 MG tablet TAKE 1 TABLET(20 MG) BY MOUTH DAILY 90 tablet 0  . Insulin Glargine (TOUJEO SOLOSTAR) 300 UNIT/ML SOPN Inject 50 Units into the skin daily. 1.5 mL 11  . CRESTOR 20 MG tablet TAKE 1 TABLET(20 MG) BY MOUTH DAILY 90 tablet 0   No facility-administered medications prior to visit.    ROS Review of Systems  Constitutional: Negative.  Negative for fever, chills, diaphoresis, activity change, appetite change, fatigue and unexpected weight change.  HENT: Negative.  Negative for sinus pressure and trouble swallowing.   Eyes: Negative.  Negative for visual disturbance.  Respiratory: Negative.  Negative for cough, choking, chest tightness, shortness of breath and stridor.   Cardiovascular: Negative.  Negative for chest pain, palpitations and leg swelling.  Gastrointestinal: Negative.  Negative for nausea, vomiting, abdominal pain, diarrhea and constipation.  Endocrine: Positive for polyuria. Negative for polydipsia and polyphagia.  Genitourinary: Negative.  Negative for dysuria, urgency, frequency, hematuria, scrotal swelling, difficulty urinating and testicular pain.  Musculoskeletal: Negative.  Negative for myalgias, back pain, joint swelling, arthralgias and neck pain.  Skin: Negative.  Negative for color change and rash.  Allergic/Immunologic: Negative.   Neurological: Negative.  Negative for dizziness.  Hematological: Negative.  Negative for adenopathy. Does not bruise/bleed easily.  Psychiatric/Behavioral: Negative.     Objective:  BP 130/58 mmHg  Pulse 63  Temp(Src) 98.8 F (37.1 C) (Oral)  Resp 16  Ht 5\' 5"  (1.651 m)  Wt 134 lb (60.782 kg)  BMI 22.30 kg/m2  SpO2  98%  BP Readings from Last 3 Encounters:  02/07/16 130/58  08/26/15 150/57  07/13/15 148/70    Wt Readings from Last 3 Encounters:  02/07/16 134 lb (60.782 kg)  08/26/15 140 lb 9 oz (63.759 kg)  07/13/15 142 lb (64.411 kg)    Physical Exam  Constitutional: He is oriented to person, place, and time. No distress.  HENT:  Head: Normocephalic and atraumatic.  Mouth/Throat: Oropharynx is clear and moist. No oropharyngeal exudate.  Eyes: Conjunctivae are normal. Right eye exhibits no discharge. Left eye exhibits no discharge. No scleral icterus.  Neck: Normal range of motion. Neck supple. No JVD present. No tracheal deviation present. No thyromegaly present.  Cardiovascular: Normal rate, regular rhythm, normal heart sounds and intact distal pulses.  Exam reveals no gallop and no friction rub.   No murmur heard. Pulmonary/Chest: Effort normal and breath sounds normal. No stridor. No respiratory distress. He has no wheezes. He has no rales. He exhibits no tenderness.  Abdominal: Soft. Bowel sounds  are normal. He exhibits no distension and no mass. There is no tenderness. There is no rebound and no guarding. Hernia confirmed negative in the right inguinal area and confirmed negative in the left inguinal area.  Genitourinary: Rectum normal, testes normal and penis normal. Rectal exam shows no external hemorrhoid, no internal hemorrhoid, no fissure, no mass, no tenderness and anal tone normal. Guaiac negative stool. Prostate is enlarged (1+ smooth symm BPH). Prostate is not tender. Right testis shows no mass, no swelling and no tenderness. Right testis is descended. Left testis shows no mass, no swelling and no tenderness. Left testis is descended. Circumcised. No penile erythema or penile tenderness. No discharge found.  Musculoskeletal: Normal range of motion. He exhibits no edema or tenderness.  Lymphadenopathy:    He has no cervical adenopathy.       Right: No inguinal adenopathy present.        Left: No inguinal adenopathy present.  Neurological: He is oriented to person, place, and time.  Skin: Skin is warm and dry. No rash noted. He is not diaphoretic. No erythema. No pallor.  Vitals reviewed.   Lab Results  Component Value Date   WBC 5.8 02/07/2016   HGB 13.8 02/07/2016   HCT 41.1 02/07/2016   PLT 166.0 02/07/2016   GLUCOSE 262* 02/07/2016   CHOL 252* 02/07/2016   TRIG 169.0* 02/07/2016   HDL 62.30 02/07/2016   LDLCALC 156* 02/07/2016   ALT 9 02/07/2016   AST 15 02/07/2016   NA 137 02/07/2016   K 3.9 02/07/2016   CL 101 02/07/2016   CREATININE 1.66* 02/07/2016   BUN 27* 02/07/2016   CO2 27 02/07/2016   TSH 0.99 02/07/2016   PSA 4.68* 02/07/2016   INR 1.07 02/03/2013   HGBA1C 7.9* 02/07/2016   MICROALBUR 10.1* 02/07/2016    No results found.  Assessment & Plan:   Kaceon was seen today for annual exam, hypertension, hyperlipidemia and diabetes.  Diagnoses and all orders for this visit:  Atherosclerotic PVD with intermittent claudication ([place])- his symptoms are stable and his pulses are still palpable in his feet, he needs to get better control of his LDL so I have asked him to restart a statin. -     Lipid panel; Future -     atorvastatin (LIPITOR) 80 MG tablet; Take 1 tablet (80 mg total) by mouth daily.  Coronary artery disease involving native coronary artery of native heart without angina pectoris- he has had no recent episodes of chest pain or shortness of breath, I've asked him to restart statin therapy. -     Lipid panel; Future -     atorvastatin (LIPITOR) 80 MG tablet; Take 1 tablet (80 mg total) by mouth daily.  Essential hypertension, benign- his blood pressures adequately well-controlled, his electrolytes are normal and his renal function is stable. -     Comprehensive metabolic panel; Future -     CBC with Differential/Platelet; Future  Diabetes 1.5, managed as type 1 ([place])- his A1c is down to 7.9%, his sugars have improved and he tells me  that this is the best control that he can currently obtain of his blood sugars. -     Comprehensive metabolic panel; Future -     Microalbumin / creatinine urine ratio; Future -     Hemoglobin A1c; Future -     Insulin Glargine (TOUJEO SOLOSTAR) 300 UNIT/ML SOPN; Inject 50 Units into the skin daily. -     atorvastatin (LIPITOR) 80 MG tablet; Take  1 tablet (80 mg total) by mouth daily.  BPH (benign prostatic hyperplasia)- his PSA has risen some over the last year but his symptoms are well controlled with Flomax, will recheck his PSA in about 4 months and if it continues to climb will consider referral to urology to screen for prostate cancer. -     Urinalysis, Routine w reflex microscopic (not at [facility]); Future -     PSA; Future  Hyperlipidemia with target LDL less than 70- he has been noncompliant with statin therapy and his LDL was not well controlled, will change him to a generic statin. -     Lipid panel; Future -     TSH; Future -     atorvastatin (LIPITOR) 80 MG tablet; Take 1 tablet (80 mg total) by mouth daily.  Routine general medical examination at a health care facility  Chronic renal insufficiency, stage 2 (mild)- his renal function is stable, he will avoid nephrotoxic agents, will continue to maintain good blood pressure control.   I have discontinued Mr. Bottino CRESTOR and [place]. I am also having him start on atorvastatin. Additionally, I am having him maintain his aspirin EC, Aclidinium Bromide, Insulin Pen Needle, traMADol, mometasone, metoprolol succinate, losartan, hydrochlorothiazide, ezetimibe, metFORMIN, tamsulosin, and Insulin Glargine.  Meds ordered this encounter  Medications  . Insulin Glargine (TOUJEO SOLOSTAR) 300 UNIT/ML SOPN    Sig: Inject 50 Units into the skin daily.    Dispense:  1.5 mL    Refill:  11  . atorvastatin (LIPITOR) 80 MG tablet    Sig: Take 1 tablet (80 mg total) by mouth daily.    Dispense:  90 tablet    Refill:  3   See AVS for  instructions about healthy living and anticipatory guidance.  Follow-up: Return in about 4 months (around 06/09/2016).  Scarlette Calico, MD

## 2016-02-07 NOTE — Patient Instructions (Signed)

## 2016-02-08 NOTE — Assessment & Plan Note (Signed)

## 2016-02-28 ENCOUNTER — Telehealth: Payer: Self-pay | Admitting: Internal Medicine

## 2016-02-28 NOTE — Telephone Encounter (Signed)
Patient states he forgot to inform Dr. Ronnald Ramp on last OV that he needs a script for diabetic shoes.  Patient believes Dr. Ronnald Ramp did a diabetic foot exam at last visit.  Please check and follow up with patient.

## 2016-02-29 DIAGNOSIS — H2513 Age-related nuclear cataract, bilateral: Secondary | ICD-10-CM | POA: Diagnosis not present

## 2016-02-29 DIAGNOSIS — E119 Type 2 diabetes mellitus without complications: Secondary | ICD-10-CM | POA: Diagnosis not present

## 2016-02-29 DIAGNOSIS — H11003 Unspecified pterygium of eye, bilateral: Secondary | ICD-10-CM | POA: Diagnosis not present

## 2016-02-29 DIAGNOSIS — H524 Presbyopia: Secondary | ICD-10-CM | POA: Diagnosis not present

## 2016-02-29 LAB — HM DIABETES EYE EXAM

## 2016-02-29 NOTE — Telephone Encounter (Signed)
Yes, thank you.

## 2016-02-29 NOTE — Telephone Encounter (Signed)
Contacted pt and he is finding out the name of the Company to send the rx to.

## 2016-02-29 NOTE — Telephone Encounter (Signed)
Contacted [employer].   They stated that they do not do Diabetic Shoes any more. They also stated that rx was done in January and Medicare will cover another pair until January, unless he is wanting to pay out of pocket ($150.00))

## 2016-02-29 NOTE — Telephone Encounter (Signed)
LVM for pt to call back as soon as possible.   

## 2016-02-29 NOTE — Telephone Encounter (Signed)
[address], [place] [address] [phone]

## 2016-02-29 NOTE — Telephone Encounter (Signed)
Please advise - can print the rx for you to sign if you agree.

## 2016-03-01 NOTE — Telephone Encounter (Signed)
Pt called back. Informed of [employer] response.   Pt states he will want until Medicare will pay for next pair.

## 2016-03-02 ENCOUNTER — Encounter: Payer: Self-pay | Admitting: Cardiology

## 2016-03-03 ENCOUNTER — Encounter: Payer: Self-pay | Admitting: Internal Medicine

## 2016-03-13 NOTE — Progress Notes (Signed)
HPI The patient presents for follow up of coronary disease with previous stent and PVD.  He presents for follow up  of coronary disease. Since I last saw him he has done well. He has had some left leg pain calls hip pain that happens with ambulation. He denies any cardiovascular symptoms otherwise. The patient denies any new symptoms such as chest discomfort, neck or arm discomfort. There has been no new shortness of breath, PND or orthopnea. There have been no reported palpitations, presyncope or syncope.  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   No Known Allergies  Current Outpatient Prescriptions  Medication Sig Dispense Refill  . Aclidinium Bromide (TUDORZA PRESSAIR) 400 MCG/ACT AEPB Inhale 1 puff into the lungs 2 (two) times daily. 1 each 11  . aspirin EC 81 MG tablet Take 81 mg by mouth daily.    Marland Kitchen atorvastatin (LIPITOR) 80 MG tablet Take 1 tablet (80 mg total) by mouth daily. 90 tablet 3  . ezetimibe (ZETIA) 10 MG tablet Take 1 tablet (10 mg total) by mouth daily. 90 tablet 3  . hydrochlorothiazide (MICROZIDE) 12.5 MG capsule TAKE 2 CAPSULES BY MOUTH EVERY DAY 180 capsule 3  . Insulin Glargine (TOUJEO SOLOSTAR) 300 UNIT/ML SOPN Inject 50 Units into the skin daily. 1.5 mL 11  . Insulin Pen Needle (BD PEN NEEDLE NANO U/F) 32G X 4 MM MISC use as directed with INSULIN 100 each 4  . losartan (COZAAR) 100 MG tablet Take 1 tablet (100 mg total) by mouth daily before breakfast. 30 tablet 11  . metFORMIN (GLUCOPHAGE) 1000 MG tablet TAKE 1 TABLET(1000 MG) BY MOUTH TWICE DAILY WITH A MEAL 180 tablet 0  . metoprolol succinate (TOPROL-XL) 50 MG 24 hr tablet TAKE 1 TABLET BY MOUTH EVERY DAY WITH OR IMMEDIATELY FOLLOWING A MEAL 30 tablet 11  . mometasone (NASONEX) 50 MCG/ACT nasal spray Place 2 sprays into the nose daily. 17 g 0  . tamsulosin (FLOMAX) 0.4 MG CAPS capsule TAKE 1 CAPSULE(0.4 MG) BY MOUTH DAILY AFTER DINNER 90 capsule 0  . traMADol (ULTRAM) 50 MG tablet Take 1 tablet  (50 mg total) by mouth every 8 (eight) hours as needed. 65 tablet 2  . nitroGLYCERIN (NITROSTAT) 0.4 MG SL tablet Place 1 tablet (0.4 mg total) under the tongue every 5 (five) minutes as needed for chest pain. 25 tablet 1   No current facility-administered medications for this visit.     Past Medical History:  Diagnosis Date  . Allergy   . Arthritis    Right Hip  . Atrial fibrillation ([place])   . COPD (chronic obstructive pulmonary disease) ([place])   . Coronary artery disease    Lexiscan Myoview 7/14:  Inf scar with minimal reversibility, inf HK, EF 45%;  Low risk.  Promus LAD stent placed in [place].  100% RCA  . Diabetes mellitus without complication ([place])   . Dyslipidemia   . GERD (gastroesophageal reflux disease)    has used Zantac in the past   . Hernia   . Hyperlipidemia   . Hypertension   . Peripheral vascular disease ([place])    a. s/p R fem-pop BPG 01/2013 (Dr. [name])  . 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 ABDOMINAL AORTAGRAM N/A 12/10/2012   Procedure: ABDOMINAL Maxcine Ham;  Surgeon: Serafina Mitchell, MD;  Location: [facility] CATH LAB;  Service: Cardiovascular;  Laterality: N/A;  .  CARDIAC CATHETERIZATION     with stent placement  . COLONOSCOPY  March 2014  . FEMORAL-POPLITEAL BYPASS GRAFT Right 02/07/2013   Procedure: BYPASS GRAFT FEMORAL-POPLITEAL ARTERY- RIGHT;  Surgeon: Mal Misty, MD;  Location[facility] OR;  Service: Vascular;  Laterality: Right;  Right femoral to below knee popliteal bypass using right non reversed great saphenous vein  . INGUINAL HERNIA REPAIR  2010   Right  . INTRAOPERATIVE ARTERIOGRAM Right 02/07/2013   Procedure: Attempted INTRA OPERATIVE ARTERIOGRAM;  Surgeon: Mal Misty, MD;  Location: [place];  Service: Vascular;  Laterality: Right;    ROS:  As stated in the HPI and negative for all other systems.   PHYSICAL EXAM BP (!) 158/70   Pulse (!) 52   Ht 5\' 5"  (1.651 m)   Wt 136 lb (61.7 kg)    BMI 22.63 kg/m  GENERAL:  Well appearing NECK:  No jugular venous distention, waveform within normal limits, carotid upstroke brisk and symmetric, no bruits, no thyromegaly LUNGS:  Clear to auscultation bilaterally BACK:  No CVA tenderness CHEST:  Unremarkable HEART:  PMI not displaced or sustained,S1 and S2 within normal limits, no S3, no S4, no clicks, no rubs, no murmurs ABD:  Flat, positive bowel sounds normal in frequency in pitch, no bruits, no rebound, no guarding, no midline pulsatile mass, no hepatomegaly, no splenomegaly EXT:  2 plus pulses upper, diminished dorsalis pedis and posterior tibialis on the left, no edema, no cyanosis no clubbing  Lab Results  Component Value Date   HGBA1C 7.9 (H) 02/07/2016   Lab Results  Component Value Date   CHOL 252 (H) 02/07/2016   TRIG 169.0 (H) 02/07/2016   HDL 62.30 02/07/2016   LDLCALC 156 (H) 02/07/2016     EKG:  Sinus bradycardia, rate 52, axis within normal limits, old inferior infarct, nonspecific lateral T-wave changes, old inferior infarct.  03/16/2016   ASSESSMENT AND PLAN  CAD  The patient had a negative stress perfusion study.   The patient is not having active symptoms.  He will continue with aggressive risk reduction.    Dyslipidemia His LDL was not close to target but I suspect that the was not taking Crestor.  He has been prescribed Lipitor for cost.  I will check a repeat lipid level in 8 weeks.    HTN The blood pressure is slightly elevated.  However, this is very unusual.  We will watch this. . No change in medications is indicated. We will continue with therapeutic lifestyle changes (TLC).  CKD I do not see creatinine was 1.66.  This was better than previous.  He and I discussed this. I will defer to Scarlette Calico, MD  DM His hemoglobin A1c was 7.9.  I had a long discussion with him about the importance of diabetes control.  I sent a note to Dr. Ronnald Ramp to inquire about [place].

## 2016-03-16 ENCOUNTER — Ambulatory Visit (INDEPENDENT_AMBULATORY_CARE_PROVIDER_SITE_OTHER): Payer: Medicare Other | Admitting: Cardiology

## 2016-03-16 ENCOUNTER — Other Ambulatory Visit: Payer: Self-pay | Admitting: Internal Medicine

## 2016-03-16 ENCOUNTER — Encounter: Payer: Self-pay | Admitting: Cardiology

## 2016-03-16 ENCOUNTER — Other Ambulatory Visit: Payer: Self-pay | Admitting: Cardiology

## 2016-03-16 VITALS — BP 158/70 | HR 52 | Ht 65.0 in | Wt 136.0 lb

## 2016-03-16 DIAGNOSIS — I739 Peripheral vascular disease, unspecified: Secondary | ICD-10-CM

## 2016-03-16 DIAGNOSIS — I2510 Atherosclerotic heart disease of native coronary artery without angina pectoris: Secondary | ICD-10-CM

## 2016-03-16 DIAGNOSIS — E785 Hyperlipidemia, unspecified: Secondary | ICD-10-CM | POA: Diagnosis not present

## 2016-03-16 DIAGNOSIS — I70219 Atherosclerosis of native arteries of extremities with intermittent claudication, unspecified extremity: Secondary | ICD-10-CM

## 2016-03-16 MED ORDER — NITROGLYCERIN 0.4 MG SL SUBL: 0.4000 mg | SUBLINGUAL_TABLET | SUBLINGUAL | 1 refills | Status: DC | PRN

## 2016-03-16 NOTE — Patient Instructions (Signed)
Medication Instructions:  START Nitroglycerin as needed for chest pain  Labwork:  Fasting Lipid Liver in 8 Weeks  Testing/Procedures: NONE  Follow-Up: You have been referred to Vascular Surgeon  Your physician wants you to follow-up in: 1 Year. You will receive a reminder letter in the mail two months in advance. If you don't receive a letter, please call our office to schedule the follow-up appointment.    Any Other Special Instructions Will Be Listed Below (If Applicable).   If you need a refill on your cardiac medications before your next appointment, please call your pharmacy.

## 2016-03-25 ENCOUNTER — Telehealth: Payer: Self-pay | Admitting: Physician Assistant

## 2016-03-25 MED ORDER — NITROGLYCERIN 0.4 MG SL SUBL: 0.4000 mg | SUBLINGUAL_TABLET | SUBLINGUAL | 0 refills | Status: DC | PRN

## 2016-03-25 NOTE — Telephone Encounter (Signed)
Pt's wife called Saturday answering service, says pharmacy said they did not receive rx for SL NTG by their system. They were told they had nothing on file. I confirmed the pharmacy he uses. I offered to go ahead and send updated refill - asked her to call our office if there are any issues picking it up. She verbalized understanding/gratitude. Cana Mignano PA-C

## 2016-04-08 IMAGING — CR DG HIP COMPLETE 2+V*R*
3 series · 3 of 3 positions shown · non-contrast
Comparison: None.

CLINICAL DATA: Right hip pain, no injury

EXAM:
RIGHT HIP - COMPLETE 2+ VIEW

[view not recorded (1 of 3)]
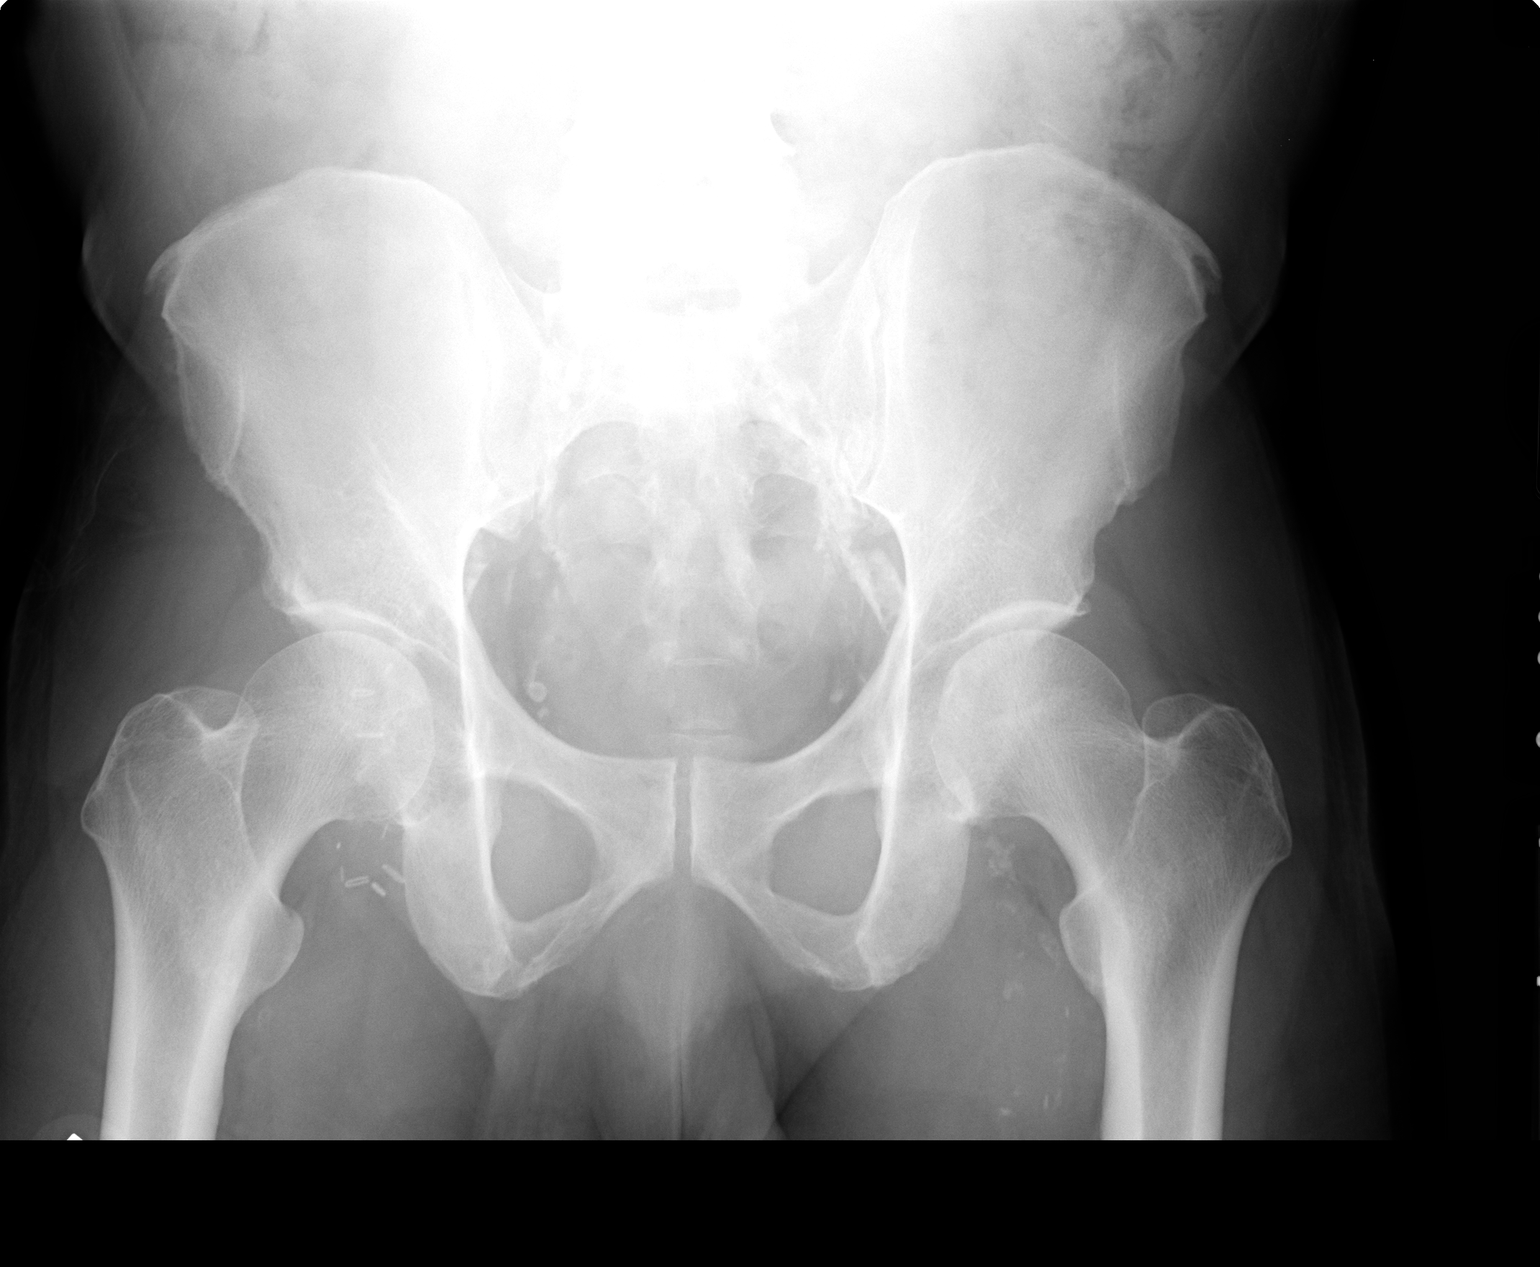

[view not recorded (2 of 3)]
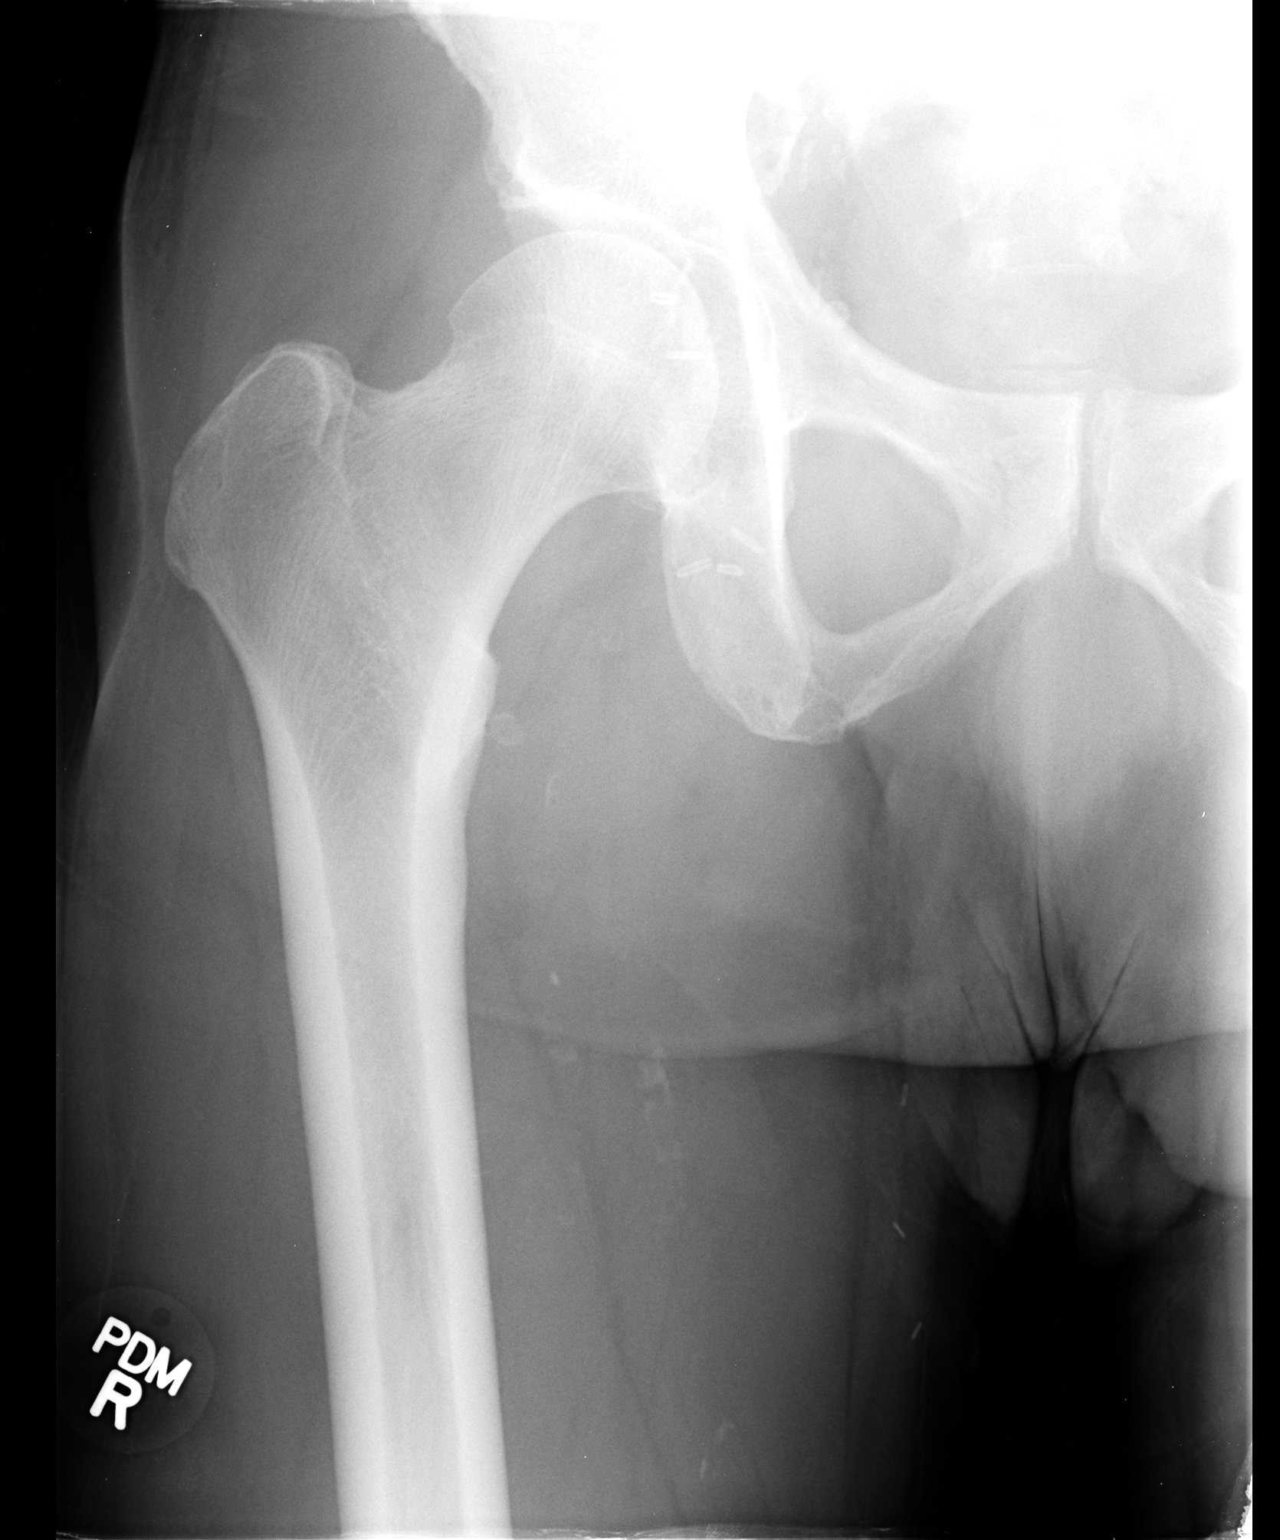

[view not recorded (3 of 3)]
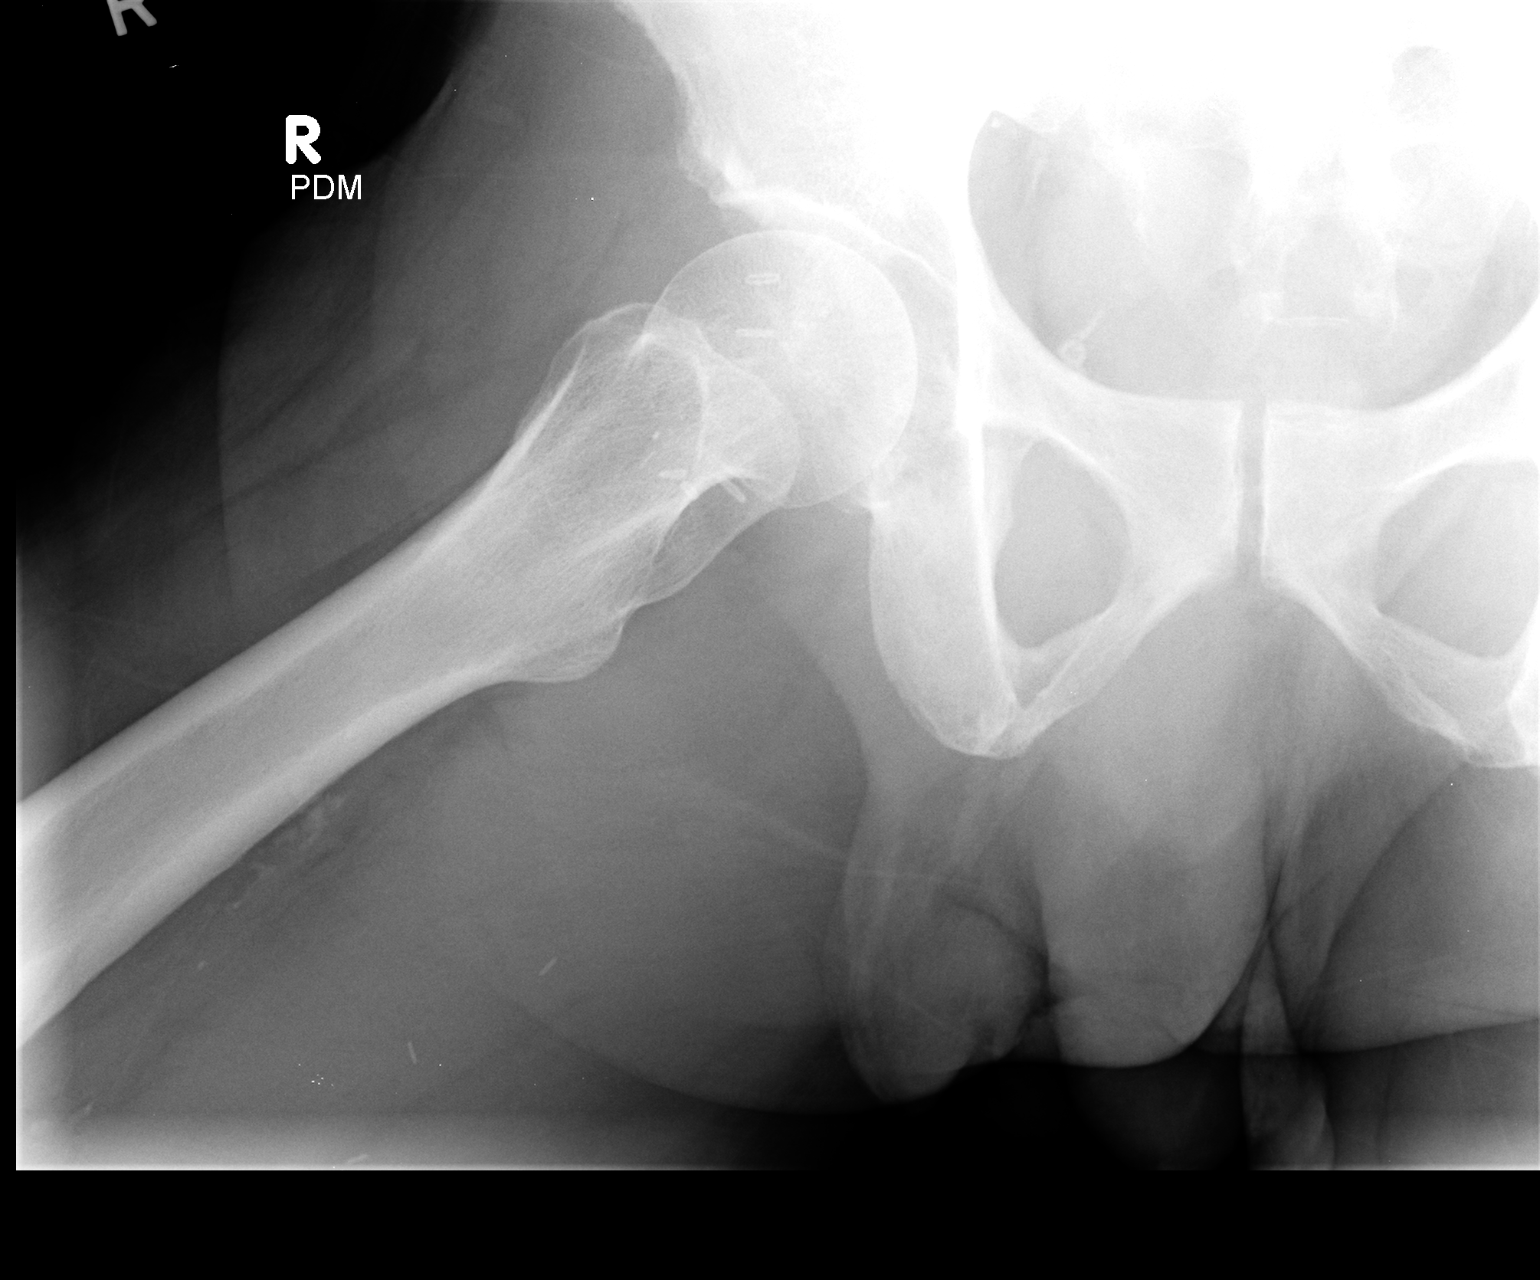

[3 of 3 positions shown; findings below may reference images not displayed]

FINDINGS: The hip joint spaces appear relatively well preserved. No
significant degenerative change is seen. The pelvic rami are intact.
Surgical clips overlie the right inguinal region. The SI joints
appear corticated. Arterial calcification is noted.
IMPRESSION: Negative.

## 2016-05-04 ENCOUNTER — Encounter: Payer: Self-pay | Admitting: Family

## 2016-05-04 DIAGNOSIS — L84 Corns and callosities: Secondary | ICD-10-CM | POA: Diagnosis not present

## 2016-05-04 DIAGNOSIS — L603 Nail dystrophy: Secondary | ICD-10-CM | POA: Diagnosis not present

## 2016-05-04 DIAGNOSIS — I739 Peripheral vascular disease, unspecified: Secondary | ICD-10-CM | POA: Diagnosis not present

## 2016-05-04 DIAGNOSIS — E1051 Type 1 diabetes mellitus with diabetic peripheral angiopathy without gangrene: Secondary | ICD-10-CM | POA: Diagnosis not present

## 2016-05-06 ENCOUNTER — Other Ambulatory Visit: Payer: Self-pay | Admitting: Cardiology

## 2016-05-06 ENCOUNTER — Other Ambulatory Visit: Payer: Self-pay | Admitting: Internal Medicine

## 2016-05-06 DIAGNOSIS — I10 Essential (primary) hypertension: Secondary | ICD-10-CM

## 2016-05-10 ENCOUNTER — Ambulatory Visit (HOSPITAL_COMMUNITY)
Admission: RE | Admit: 2016-05-10 | Discharge: 2016-05-10 | Disposition: A | Discharge status: Home / Self Care | Payer: Medicare Other | Source: Ambulatory Visit | Attending: Family | Admitting: Family

## 2016-05-10 ENCOUNTER — Encounter (HOSPITAL_COMMUNITY): Payer: Self-pay

## 2016-05-10 ENCOUNTER — Ambulatory Visit (INDEPENDENT_AMBULATORY_CARE_PROVIDER_SITE_OTHER): Payer: Medicare Other | Admitting: Family

## 2016-05-10 ENCOUNTER — Encounter: Payer: Self-pay | Admitting: Family

## 2016-05-10 VITALS — BP 148/72 | HR 58 | Temp 97.0°F | Resp 16 | Ht 65.0 in | Wt 142.0 lb

## 2016-05-10 DIAGNOSIS — Z87891 Personal history of nicotine dependence: Secondary | ICD-10-CM | POA: Insufficient documentation

## 2016-05-10 DIAGNOSIS — Z95828 Presence of other vascular implants and grafts: Secondary | ICD-10-CM | POA: Diagnosis not present

## 2016-05-10 DIAGNOSIS — I70212 Atherosclerosis of native arteries of extremities with intermittent claudication, left leg: Secondary | ICD-10-CM

## 2016-05-10 DIAGNOSIS — Z4889 Encounter for other specified surgical aftercare: Secondary | ICD-10-CM | POA: Insufficient documentation

## 2016-05-10 DIAGNOSIS — R0989 Other specified symptoms and signs involving the circulatory and respiratory systems: Secondary | ICD-10-CM | POA: Diagnosis not present

## 2016-05-10 DIAGNOSIS — I70219 Atherosclerosis of native arteries of extremities with intermittent claudication, unspecified extremity: Secondary | ICD-10-CM

## 2016-05-10 DIAGNOSIS — R9439 Abnormal result of other cardiovascular function study: Secondary | ICD-10-CM | POA: Diagnosis not present

## 2016-05-10 DIAGNOSIS — Z9889 Other specified postprocedural states: Secondary | ICD-10-CM | POA: Diagnosis not present

## 2016-05-10 NOTE — Progress Notes (Signed)
[facility] [place]   CC: Follow up peripheral artery occlusive disease  History of Present Illness Steven [name] is a 70 y.o. male patient of Dr. Kellie Simmering who returns status post right femoral-popliteal bypass with saphenous vein graft in July of 2014 for severe claudication right leg. He is having no right leg claudication symptoms. He denies non healing wounds.  He has right hip pain if he walks fast or a long distance, denies right hip pain in the morning. He also has left calf tightness when he walks fast, relieved by rest, states this is how his right leg felt before the bypass in his right leg.   The patient denies New Medical or Surgical History. He has CAD, sees a cardiologist.  Pt Diabetic: Yes, Review of records: A1C on 12/29/2014 was 7.0; was 7.9 on 02/07/16 Pt smoker: former smoker, quit in 2012  Pt meds include: Statin: yes, resumed June 2016 Betablocker: Yes ASA: Yes Other anticoagulants/antiplatelets: no     Past Medical History:  Diagnosis Date  . Allergy   . Arthritis    Right Hip  . Atrial fibrillation ([place])   . COPD (chronic obstructive pulmonary disease) ([place])   . Coronary artery disease    Lexiscan Myoview 7/14:  Inf scar with minimal reversibility, inf HK, EF 45%;  Low risk.  Promus LAD stent placed in [place].  100% RCA  . Diabetes mellitus without complication ([place])   . Dyslipidemia   . GERD (gastroesophageal reflux disease)    has used Zantac in the past   . Hernia   . Hyperlipidemia   . Hypertension   . Peripheral vascular disease ([place])    a. s/p R fem-pop BPG 01/2013 (Dr. [name])  . 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    Social History Social History  Substance Use Topics  . Smoking status: Former Smoker    Types: Cigarettes    Quit date: 02/04/2011  . Smokeless tobacco: Never Used     Comment: Quit tobacco 2 years ago.    . Alcohol use 1.8 oz/week    3 Cans of beer per week     Comment: everyday, beer 40 ozs &  alcohol- 1-2 shots     Family History Family History  Problem Relation Age of Onset  . Diabetes Mother   . Hypertension Mother   . Heart disease Mother   . Hyperlipidemia Mother   . Diabetes Father   . Heart disease Father   . Hyperlipidemia Father   . Hypertension Father   . Diabetes Sister   . Heart disease Sister   . Hyperlipidemia Sister   . Hypertension Sister   . Diabetes Brother   . Heart disease Brother   . Hyperlipidemia Brother   . Hypertension Brother   . Diabetes Son   . Heart disease Son   . Hyperlipidemia Son   . Hypertension Son   . Alcohol abuse Neg Hx   . Cancer Neg Hx   . Depression Neg Hx   . Early death Neg Hx   . Kidney disease Neg Hx   . Stroke Neg Hx   . Colon cancer Neg Hx   . Esophageal cancer Neg Hx   . Rectal cancer Neg Hx   . Stomach cancer Neg Hx     Past Surgical History:  Procedure Laterality Date  . ABDOMINAL AORTAGRAM N/A 12/10/2012   Procedure: ABDOMINAL Maxcine Ham;  Surgeon: Serafina Mitchell, MD;  Location: [facility] CATH  LAB;  Service: Cardiovascular;  Laterality: N/A;  . CARDIAC CATHETERIZATION     with stent placement  . COLONOSCOPY  March 2014  . FEMORAL-POPLITEAL BYPASS GRAFT Right 02/07/2013   Procedure: BYPASS GRAFT FEMORAL-POPLITEAL ARTERY- RIGHT;  Surgeon: Mal Misty, MD;  Location: [facility] OR;  Service: Vascular;  Laterality: Right;  Right femoral to below knee popliteal bypass using right non reversed great saphenous vein  . INGUINAL HERNIA REPAIR  2010   Right  . INTRAOPERATIVE ARTERIOGRAM Right 02/07/2013   Procedure: Attempted INTRA OPERATIVE ARTERIOGRAM;  Surgeon: Mal Misty, MD;  Location: [facility] OR;  Service: Vascular;  Laterality: Right;    No Known Allergies  Current Outpatient Prescriptions  Medication Sig Dispense Refill  . Aclidinium Bromide (TUDORZA PRESSAIR) 400 MCG/ACT AEPB Inhale 1 puff into the lungs 2 (two) times daily. 1 each 11  . aspirin EC 81 MG tablet Take 81 mg by mouth daily.    Marland Kitchen atorvastatin  (LIPITOR) 80 MG tablet Take 1 tablet (80 mg total) by mouth daily. 90 tablet 3  . ezetimibe (ZETIA) 10 MG tablet Take 1 tablet (10 mg total) by mouth daily. 90 tablet 3  . hydrochlorothiazide (MICROZIDE) 12.5 MG capsule TAKE 2 CAPSULES BY MOUTH EVERY DAY 180 capsule 3  . Insulin Glargine (TOUJEO SOLOSTAR) 300 UNIT/ML SOPN Inject 50 Units into the skin daily. 1.5 mL 11  . Insulin Pen Needle (BD PEN NEEDLE NANO U/F) 32G X 4 MM MISC use as directed with INSULIN 100 each 4  . losartan (COZAAR) 100 MG tablet TAKE 1 TABLET(100 MG) BY MOUTH DAILY BEFORE BREAKFAST 30 tablet 0  . metFORMIN (GLUCOPHAGE) 1000 MG tablet TAKE 1 TABLET(1000 MG) BY MOUTH TWICE DAILY WITH A MEAL 180 tablet 0  . metoprolol succinate (TOPROL-XL) 50 MG 24 hr tablet TAKE 1 TABLET BY MOUTH EVERY DAY WITH OR IMMEDIATELY FOLLOWING A MEAL 30 tablet 0  . mometasone (NASONEX) 50 MCG/ACT nasal spray Place 2 sprays into the nose daily. 17 g 0  . nitroGLYCERIN (NITROSTAT) 0.4 MG SL tablet Place 1 tablet (0.4 mg total) under the tongue every 5 (five) minutes as needed for chest pain. 25 tablet 0  . tamsulosin (FLOMAX) 0.4 MG CAPS capsule TAKE 1 CAPSULE(0.4 MG) BY MOUTH DAILY AFTER DINNER 90 capsule 0  . traMADol (ULTRAM) 50 MG tablet Take 1 tablet (50 mg total) by mouth every 8 (eight) hours as needed. 65 tablet 2   No current facility-administered medications for this visit.     ROS: See HPI for pertinent positives and negatives.   Physical Examination  Vitals:   05/10/16 1527 05/10/16 1531  BP: (!) 166/67 (!) 148/72  Pulse: (!) 58   Resp: 16   Temp: 97 F (36.1 C)   TempSrc: Oral   SpO2: 97%   Weight: 142 lb (64.4 kg)   Height: 5\' 5"  (1.651 m)    Body mass index is 23.63 kg/m.  General: A&O x 3, WDWN. Gait: normal Eyes: PERRLA. Pulmonary: Respirations are non labored, CTAB, without wheezes , rales, or rhonchi. Cardiac: regular rhythm, bradycardic (on a beta blocker), no detected murmur.     Carotid Bruits  Right Left   Negative Positive   Aorta is not palpable. Radial pulses: are 2+ right and 1+ left palpable    VASCULAR EXAM: Extremities without ischemic changes  without Gangrene; without open wounds.     LE Pulses Right Left   FEMORAL 3+ palpable 3+ palpable    POPLITEAL not palpable  not  palpable   POSTERIOR TIBIAL 1+ palpable  not palpable    DORSALIS PEDIS  ANTERIOR TIBIAL not palpable  not palpable    Abdomen: soft, NT, no palpable masses. Skin: no rashes, no ulcers. Musculoskeletal: no muscle wasting or atrophy. Neurologic: A&O X 3; Appropriate Affect; MOTOR FUNCTION: moving all extremities equally, motor strength 5/5 throughout. Speech is fluent/normal. CN 2-12 intact.    ASSESSMENT: DMOREA SPARGER is a 70 y.o. male who is status post right femoral-popliteal bypass with saphenous vein graft in July of 2014 for severe claudication right leg. He is having no right leg claudication symptoms.  He walks a great deal as part of his job which facilitates the formation of collateral arterial perfusion. He has mild claudication in his left calf, is not life limiting. He has no signs of ischemia in his feet or toes which are pink and warm. Left carotid bruit detected on today's exam, not present on last exam; he denies any known history of stroke or TIA; no carotid duplex results on file; see Plan.   His atherosclerotic risk factors include slightly worsened control of DM, former smoker, and CAD.   DATA Today's right LE arterial Duplex suggests inflow and outflow disease with inflow stenosis >50% (229 and 266 cm/s), and outflow stenosis <50%.  Right ABI declined to 0.89 from 1.03 with tri and monophasic waveforms, right TBI is normal.  Left ABI remains stable  with severe arterial occlusive disease at 0.51, monophasic waveforms, TBI has improved to 0.59 from 0.16, but waveform appears more dampened than previously.     PLAN:  Based on the patient's vascular studies and examination, pt will return to clinic in 1 year with bilateral carotid duplex, ABI's, and right LE arterial duplex.  I advised him to return sooner if the pain in his left calf worsens.  I discussed in depth with the patient the nature of atherosclerosis, and emphasized the importance of maximal medical management including strict control of blood pressure, blood glucose, and lipid levels, obtaining regular exercise, and continued cessation of smoking.  The patient is aware that without maximal medical management the underlying atherosclerotic disease process will progress, limiting the benefit of any interventions.  The patient was given information about PAD including signs, symptoms, treatment, what symptoms should prompt the patient to seek immediate medical care, and risk reduction measures to take.  Clemon Chambers, RN, MSN, FNP-C [facility] Phone: [phone]  Clinic MD: Scot Dock  05/10/16 3:35 PM

## 2016-05-10 NOTE — Patient Instructions (Signed)
Peripheral Vascular Disease Peripheral vascular disease (PVD) is a disease of the blood vessels that are not part of your heart and brain. A simple term for PVD is poor circulation. In most cases, PVD narrows the blood vessels that carry blood from your heart to the rest of your body. This can result in a decreased supply of blood to your arms, legs, and internal organs, like your stomach or kidneys. However, it most often affects a person's lower legs and feet. There are two types of PVD.  Organic PVD. This is the more common type. It is caused by damage to the structure of blood vessels.  Functional PVD. This is caused by conditions that make blood vessels contract and tighten (spasm). Without treatment, PVD tends to get worse over time. PVD can also lead to acute ischemic limb. This is when an arm or limb suddenly has trouble getting enough blood. This is a medical emergency. CAUSES Each type of PVD has many different causes. The most common cause of PVD is buildup of a fatty material (plaque) inside of your arteries (atherosclerosis). Small amounts of plaque can break off from the walls of the blood vessels and become lodged in a smaller artery. This blocks blood flow and can cause acute ischemic limb. Other common causes of PVD include:  Blood clots that form inside of blood vessels.  Injuries to blood vessels.  Diseases that cause inflammation of blood vessels or cause blood vessel spasms.  Health behaviors and health history that increase your risk of developing PVD. RISK FACTORS  You may have a greater risk of PVD if you:  Have a family history of PVD.  Have certain medical conditions, including:  High cholesterol.  Diabetes.  High blood pressure (hypertension).  Coronary heart disease.  Past problems with blood clots.  Past injury, such as burns or a broken bone. These may have damaged blood vessels in your limbs.  Buerger disease. This is caused by inflamed blood  vessels in your hands and feet.  Some forms of arthritis.  Rare birth defects that affect the arteries in your legs.  Use tobacco.  Do not get enough exercise.  Are obese.  Are age 50 or older. SIGNS AND SYMPTOMS  PVD may cause many different symptoms. Your symptoms depend on what part of your body is not getting enough blood. Some common signs and symptoms include:  Cramps in your lower legs. This may be a symptom of poor leg circulation (claudication).  Pain and weakness in your legs while you are physically active that goes away when you rest (intermittent claudication).  Leg pain when at rest.  Leg numbness, tingling, or weakness.  Coldness in a leg or foot, especially when compared with the other leg.  Skin or hair changes. These can include:  Hair loss.  Shiny skin.  Pale or bluish skin.  Thick toenails.  Inability to get or maintain an erection (erectile dysfunction). People with PVD are more prone to developing ulcers and sores on their toes, feet, or legs. These may take longer than normal to heal. DIAGNOSIS Your health care provider may diagnose PVD from your signs and symptoms. The health care provider will also do a physical exam. You may have tests to find out what is causing your PVD and determine its severity. Tests may include:  Blood pressure recordings from your arms and legs and measurements of the strength of your pulses (pulse volume recordings).  Imaging studies using sound waves to take pictures of   the blood flow through your blood vessels (Doppler ultrasound).  Injecting a dye into your blood vessels before having imaging studies using:  X-rays (angiogram or arteriogram).  Computer-generated X-rays (CT angiogram).  A powerful electromagnetic field and a computer (magnetic resonance angiogram or MRA). TREATMENT Treatment for PVD depends on the cause of your condition and the severity of your symptoms. It also depends on your age. Underlying  causes need to be treated and controlled. These include long-lasting (chronic) conditions, such as diabetes, high cholesterol, and high blood pressure. You may need to first try making lifestyle changes and taking medicines. Surgery may be needed if these do not work. Lifestyle changes may include:  Quitting smoking.  Exercising regularly.  Following a low-fat, low-cholesterol diet. Medicines may include:  Blood thinners to prevent blood clots.  Medicines to improve blood flow.  Medicines to improve your blood cholesterol levels. Surgical procedures may include:  A procedure that uses an inflated balloon to open a blocked artery and improve blood flow (angioplasty).  A procedure to put in a tube (stent) to keep a blocked artery open (stent implant).  Surgery to reroute blood flow around a blocked artery (peripheral bypass surgery).  Surgery to remove dead tissue from an infected wound on the affected limb.  Amputation. This is surgical removal of the affected limb. This may be necessary in cases of acute ischemic limb that are not improved through medical or surgical treatments. HOME CARE INSTRUCTIONS  Take medicines only as directed by your health care provider.  Do not use any tobacco products, including cigarettes, chewing tobacco, or electronic cigarettes. If you need help quitting, ask your health care provider.  Lose weight if you are overweight, and maintain a healthy weight as directed by your health care provider.  Eat a diet that is low in fat and cholesterol. If you need help, ask your health care provider.  Exercise regularly. Ask your health care provider to suggest some good activities for you.  Use compression stockings or other mechanical devices as directed by your health care provider.  Take good care of your feet.  Wear comfortable shoes that fit well.  Check your feet often for any cuts or sores. SEEK MEDICAL CARE IF:  You have cramps in your legs  while walking.  You have leg pain when you are at rest.  You have coldness in a leg or foot.  Your skin changes.  You have erectile dysfunction.  You have cuts or sores on your feet that are not healing. SEEK IMMEDIATE MEDICAL CARE IF:  Your arm or leg turns cold and blue.  Your arms or legs become red, warm, swollen, painful, or numb.  You have chest pain or trouble breathing.  You suddenly have weakness in your face, arm, or leg.  You become very confused or lose the ability to speak.  You suddenly have a very bad headache or lose your vision.   This information is not intended to replace advice given to you by your health care provider. Make sure you discuss any questions you have with your health care provider.   Document Released: 08/17/2004 Document Revised: 07/31/2014 Document Reviewed: 12/18/2013 Elsevier Interactive Patient Education 2016 [employer] Inc[employer]  

## 2016-05-17 ENCOUNTER — Telehealth: Payer: Self-pay | Admitting: Internal Medicine

## 2016-05-17 NOTE — Telephone Encounter (Signed)
Routed to me by mistake; to dr Isabella Stalling

## 2016-05-17 NOTE — Telephone Encounter (Signed)
Forwarded to Dr. Jones

## 2016-05-17 NOTE — Telephone Encounter (Signed)
Patient would like to transfer care from Dr, Ronnald Ramp to [facility]

## 2016-05-18 NOTE — Telephone Encounter (Signed)
yes

## 2016-05-23 NOTE — Telephone Encounter (Signed)
I got a message stating pt wants to switch to me. Looks like moderate to high number of medical problems and he has been with Dr Ronnald Ramp for a while. Was wondering why he wants to switch. Is travel distance? Looks like his MD has taken good are of him. I don't see that I have ever seen him before? If he wants to switch this is ok with me but would you make him aware that reviewing his medical history/diabetes history I would consider referring to endocrinologist if his sugars get out of control further since he is already on basal insulin and strong treatment. His former pcp may have higher threshold before making referral to endocrinologist. In fact he may rarely refer his diabetic patients out? I thought this might be consideration in light of fact he has never met me? Other option is next time he has an acute illness and he needs to be seen I could see him and meet him.  Would you notify pt of the above.

## 2016-05-23 NOTE — Telephone Encounter (Signed)
I am getting Ashlee to call pt and answer couple of question I have before deciding on accepting patient.

## 2016-05-25 NOTE — Telephone Encounter (Signed)
Called and left a message for call back  

## 2016-05-29 DIAGNOSIS — I739 Peripheral vascular disease, unspecified: Secondary | ICD-10-CM | POA: Diagnosis not present

## 2016-05-29 DIAGNOSIS — I2510 Atherosclerotic heart disease of native coronary artery without angina pectoris: Secondary | ICD-10-CM | POA: Diagnosis not present

## 2016-05-29 DIAGNOSIS — E785 Hyperlipidemia, unspecified: Secondary | ICD-10-CM | POA: Diagnosis not present

## 2016-05-30 LAB — LIPID PANEL
CHOLESTEROL: 161 mg/dL (ref <200)
HDL: 103 mg/dL (ref >40)
LDL Cholesterol: 43 mg/dL
TRIGLYCERIDES: 75 mg/dL (ref <150)
Total CHOL/HDL Ratio: 1.6 Ratio (ref <5.0)
VLDL: 15 mg/dL (ref <30)

## 2016-05-30 LAB — HEPATIC FUNCTION PANEL
ALT: 22 U/L (ref 9–46)
AST: 27 U/L (ref 10–35)
Albumin: 4.2 g/dL (ref 3.6–5.1)
Alkaline Phosphatase: 68 U/L (ref 40–115)
BILIRUBIN INDIRECT: 0.4 mg/dL (ref 0.2–1.2)
Bilirubin, Direct: 0.1 mg/dL (ref <=0.2)
TOTAL PROTEIN: 6.9 g/dL (ref 6.1–8.1)
Total Bilirubin: 0.5 mg/dL (ref 0.2–1.2)

## 2016-06-02 ENCOUNTER — Telehealth: Payer: Self-pay | Admitting: Cardiology

## 2016-06-02 ENCOUNTER — Telehealth: Payer: Self-pay | Admitting: *Deleted

## 2016-06-02 DIAGNOSIS — E785 Hyperlipidemia, unspecified: Secondary | ICD-10-CM

## 2016-06-02 DIAGNOSIS — Z79899 Other long term (current) drug therapy: Secondary | ICD-10-CM

## 2016-06-02 MED ORDER — ROSUVASTATIN CALCIUM 40 MG PO TABS: 40.0000 mg | ORAL_TABLET | Freq: Every day | ORAL | 11 refills | Status: DC

## 2016-06-02 NOTE — Telephone Encounter (Signed)
-----   Message from Minus Breeding, MD sent at 06/01/2016 10:02 AM EST ----- I find it hard to believe that he is taking his Lipitor 80 mg daily.  If he really is taking it every day it is not working to bring down his LDL and he needs to take Crestor 40 mg daily.  One po daily disp number 90 with 3 refills.  Repeat liver profile and lipids in 8 weeks.

## 2016-06-02 NOTE — Telephone Encounter (Signed)
[name]     06/02/16 3:32 PM  Note    Pt is aware of his blood work, pt stated he don't think his atorvastatin is working for him, Medications was change to Crestor 40 mg daily, pt is aware of the change and to get fasting blood work in 8 weeks, lab slip mail to pt.  Rx has been sent to the pharmacy electronically. Crestor 40 mg # 30 11 refills    [name]     06/02/16 3:27 PM  Note    ----- Message from Minus Breeding, MD sent at 06/01/2016 10:02 AM EST ----- I find it hard to believe that he is taking his Lipitor 80 mg daily.  If he really is taking it every day it is not working to bring down his LDL and he needs to take Crestor 40 mg daily.  One po daily disp number 90 with 3 refills.  Repeat liver profile and lipids in 8 weeks.

## 2016-06-02 NOTE — Telephone Encounter (Signed)
Pt is aware of his blood work, pt stated he don't think his atorvastatin is working for him, Medications was change to Crestor 40 mg daily, pt is aware of the change and to get fasting blood work in 8 weeks, lab slip mail to pt.  Rx has been sent to the pharmacy electronically. Crestor 40 mg # 30 11 refills

## 2016-06-02 NOTE — Telephone Encounter (Signed)
Pt was told to call and give the name of medication:Atorvastpin 80mg  1 time a day

## 2016-06-05 ENCOUNTER — Telehealth: Payer: Self-pay | Admitting: Internal Medicine

## 2016-06-05 NOTE — Telephone Encounter (Signed)
Pt stating he did lab work back in 01/2016 but he never got the result of that blood work. Please advise.

## 2016-06-06 NOTE — Telephone Encounter (Signed)
LVM for pt to call back as soon as possible.   

## 2016-06-06 NOTE — Addendum Note (Signed)
Addended by: Mena Goes on: 06/06/2016 03:58 PM   Modules accepted: Orders

## 2016-06-07 ENCOUNTER — Ambulatory Visit (INDEPENDENT_AMBULATORY_CARE_PROVIDER_SITE_OTHER): Payer: Medicare Other | Admitting: Internal Medicine

## 2016-06-07 ENCOUNTER — Encounter: Payer: Self-pay | Admitting: Internal Medicine

## 2016-06-07 VITALS — BP 152/64 | HR 54 | Temp 97.9°F | Resp 16 | Ht 65.0 in | Wt 147.5 lb

## 2016-06-07 DIAGNOSIS — L02219 Cutaneous abscess of trunk, unspecified: Secondary | ICD-10-CM

## 2016-06-07 DIAGNOSIS — I70219 Atherosclerosis of native arteries of extremities with intermittent claudication, unspecified extremity: Secondary | ICD-10-CM

## 2016-06-07 DIAGNOSIS — L03319 Cellulitis of trunk, unspecified: Secondary | ICD-10-CM | POA: Diagnosis not present

## 2016-06-07 NOTE — Progress Notes (Signed)
Subjective:  Patient ID: Steven [name], male    DOB: [birth year]  Age: 70 y.o. MRN: [id number]  CC: Cyst   HPI Steven [name] presents for concerns about a cyst on his left chest wall that has been present for about a year. He has some discomfort at the site but no redness, swelling, exudate, or fever.  Outpatient Medications Prior to Visit  Medication Sig Dispense Refill  . Aclidinium Bromide (TUDORZA PRESSAIR) 400 MCG/ACT AEPB Inhale 1 puff into the lungs 2 (two) times daily. 1 each 11  . aspirin EC 81 MG tablet Take 81 mg by mouth daily.    Marland Kitchen ezetimibe (ZETIA) 10 MG tablet Take 1 tablet (10 mg total) by mouth daily. 90 tablet 3  . hydrochlorothiazide (MICROZIDE) 12.5 MG capsule TAKE 2 CAPSULES BY MOUTH EVERY DAY 180 capsule 3  . Insulin Glargine (TOUJEO SOLOSTAR) 300 UNIT/ML SOPN Inject 50 Units into the skin daily. 1.5 mL 11  . Insulin Pen Needle (BD PEN NEEDLE NANO U/F) 32G X 4 MM MISC use as directed with INSULIN 100 each 4  . losartan (COZAAR) 100 MG tablet TAKE 1 TABLET(100 MG) BY MOUTH DAILY BEFORE BREAKFAST 30 tablet 0  . metFORMIN (GLUCOPHAGE) 1000 MG tablet TAKE 1 TABLET(1000 MG) BY MOUTH TWICE DAILY WITH A MEAL 180 tablet 0  . metoprolol succinate (TOPROL-XL) 50 MG 24 hr tablet TAKE 1 TABLET BY MOUTH EVERY DAY WITH OR IMMEDIATELY FOLLOWING A MEAL 30 tablet 0  . mometasone (NASONEX) 50 MCG/ACT nasal spray Place 2 sprays into the nose daily. 17 g 0  . rosuvastatin (CRESTOR) 40 MG tablet Take 1 tablet (40 mg total) by mouth daily. 30 tablet 11  . tamsulosin (FLOMAX) 0.4 MG CAPS capsule TAKE 1 CAPSULE(0.4 MG) BY MOUTH DAILY AFTER DINNER 90 capsule 0  . traMADol (ULTRAM) 50 MG tablet Take 1 tablet (50 mg total) by mouth every 8 (eight) hours as needed. 65 tablet 2  . nitroGLYCERIN (NITROSTAT) 0.4 MG SL tablet Place 1 tablet (0.4 mg total) under the tongue every 5 (five) minutes as needed for chest pain. (Patient not taking: Reported on 06/07/2016) 25 tablet 0   No  facility-administered medications prior to visit.     ROS Review of Systems  All other systems reviewed and are negative.   Objective:  BP (!) 152/64 (BP Location: Left Arm, Patient Position: Sitting, Cuff Size: Normal)   Pulse (!) 54   Temp 97.9 F (36.6 C) (Oral)   Resp 16   Ht 5\' 5"  (1.651 m)   Wt 147 lb 8 oz (66.9 kg)   SpO2 98%   BMI 24.55 kg/m   BP Readings from Last 3 Encounters:  06/07/16 (!) 152/64  05/10/16 (!) 148/72  03/16/16 (!) 158/70    Wt Readings from Last 3 Encounters:  06/07/16 147 lb 8 oz (66.9 kg)  05/10/16 142 lb (64.4 kg)  03/16/16 136 lb (61.7 kg)    Physical Exam  Pulmonary/Chest:    The area was cleaned with Betadine and prepped and draped in sterile fashion. Local anesthesia was obtained with the installation of 2% lidocaine with epinephrine. Approximately 2 mL were used. A 4 mm punch incision was made. A copious amount of the previously described exudate was removed. The cavity was explored with Q-tips and hydrogen peroxide. There was no purulent exudate. The contents of an epidermal inclusion cyst were removed. The cavity was packed with iodoform and a dressing was applied. He tolerated this well.  Lab Results  Component Value Date   WBC 5.8 02/07/2016   HGB 13.8 02/07/2016   HCT 41.1 02/07/2016   PLT 166.0 02/07/2016   GLUCOSE 262 (H) 02/07/2016   CHOL 161 05/29/2016   TRIG 75 05/29/2016   HDL 103 05/29/2016   LDLCALC 43 05/29/2016   ALT 22 05/29/2016   AST 27 05/29/2016   NA 137 02/07/2016   K 3.9 02/07/2016   CL 101 02/07/2016   CREATININE 1.66 (H) 02/07/2016   BUN 27 (H) 02/07/2016   CO2 27 02/07/2016   TSH 0.99 02/07/2016   PSA 4.68 (H) 02/07/2016   INR 1.07 02/03/2013   HGBA1C 7.9 (H) 02/07/2016   MICROALBUR 10.1 (H) 02/07/2016    No results found.  Assessment & Plan:   Steven [name] was seen today for cyst.  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 If this re-grows then I will send him to general surgery to have a full excision of the cystic structure.   I am having Steven [name] maintain his aspirin EC, Aclidinium Bromide, Insulin Pen Needle, traMADol, mometasone, hydrochlorothiazide, ezetimibe, Insulin Glargine, nitroGLYCERIN, metFORMIN, tamsulosin, metoprolol succinate, losartan, and rosuvastatin.  No orders of the defined types were placed in this encounter.    Follow-up: Return in about 1 week (around 06/14/2016).  Scarlette Calico, MD

## 2016-06-07 NOTE — Progress Notes (Signed)
Pre visit review using our clinic review tool, if applicable. No additional management support is needed unless otherwise documented below in the visit note. 

## 2016-06-07 NOTE — Telephone Encounter (Signed)
Pt is here for an appt. Closing phone note.

## 2016-06-07 NOTE — Patient Instructions (Signed)

## 2016-06-11 ENCOUNTER — Other Ambulatory Visit: Payer: Self-pay | Admitting: Cardiology

## 2016-06-11 DIAGNOSIS — L720 Epidermal cyst: Secondary | ICD-10-CM | POA: Insufficient documentation

## 2016-06-11 DIAGNOSIS — I10 Essential (primary) hypertension: Secondary | ICD-10-CM

## 2016-06-14 ENCOUNTER — Encounter: Payer: Self-pay | Admitting: Internal Medicine

## 2016-06-14 ENCOUNTER — Ambulatory Visit (INDEPENDENT_AMBULATORY_CARE_PROVIDER_SITE_OTHER): Payer: Medicare Other | Admitting: Internal Medicine

## 2016-06-14 VITALS — BP 122/68 | HR 70 | Temp 98.3°F | Resp 16 | Ht 65.0 in | Wt 150.8 lb

## 2016-06-14 DIAGNOSIS — Z23 Encounter for immunization: Secondary | ICD-10-CM | POA: Diagnosis not present

## 2016-06-14 DIAGNOSIS — L720 Epidermal cyst: Secondary | ICD-10-CM

## 2016-06-14 NOTE — Patient Instructions (Signed)
Epidermal Cyst An epidermal cyst is sometimes called a sebaceous cyst, epidermal inclusion cyst, or infundibular cyst. These cysts usually contain a substance that looks "pasty" or "cheesy" and may have a bad smell. This substance is a protein called keratin. Epidermal cysts are usually found on the face, neck, or trunk. They may also occur in the vaginal area or other parts of the genitalia of both men and women. Epidermal cysts are usually small, painless, slow-growing bumps or lumps that move freely under the skin. It is important not to try to pop them. This may cause an infection and lead to tenderness and swelling. CAUSES  Epidermal cysts may be caused by a deep penetrating injury to the skin or a plugged hair follicle, often associated with acne. SYMPTOMS  Epidermal cysts can become inflamed and cause:  Redness.  Tenderness.  Increased temperature of the skin over the bumps or lumps.  Grayish-white, bad smelling material that drains from the bump or lump. DIAGNOSIS  Epidermal cysts are easily diagnosed by your caregiver during an exam. Rarely, a tissue sample (biopsy) may be taken to rule out other conditions that may resemble epidermal cysts. TREATMENT   Epidermal cysts often get better and disappear on their own. They are rarely ever cancerous.  If a cyst becomes infected, it may become inflamed and tender. This may require opening and draining the cyst. Treatment with antibiotics may be necessary. When the infection is gone, the cyst may be removed with minor surgery.  Small, inflamed cysts can often be treated with antibiotics or by injecting steroid medicines.  Sometimes, epidermal cysts become large and bothersome. If this happens, surgical removal in your caregiver's office may be necessary. HOME CARE INSTRUCTIONS  Only take over-the-counter or prescription medicines as directed by your caregiver.  Take your antibiotics as directed. Finish them even if you start to feel  better. SEEK MEDICAL CARE IF:   Your cyst becomes tender, red, or swollen.  Your condition is not improving or is getting worse.  You have any other questions or concerns. MAKE SURE YOU:  Understand these instructions.  Will watch your condition.  Will get help right away if you are not doing well or get worse. This information is not intended to replace advice given to you by your health care provider. Make sure you discuss any questions you have with your health care provider. Document Released: 06/10/2004 Document Revised: 10/02/2011 Document Reviewed: 05/12/2015 Elsevier Interactive Patient Education  2017 [employer].

## 2016-06-14 NOTE — Progress Notes (Signed)
Subjective:  Patient ID: Steven [name], male    DOB: 15-Jul-1946  Age: 70 y.o. MRN: [id number]  CC: Wound Check   HPI Steven [name] presents for recheck of area of incision and drainage over his left chest wall. He tells me the packing is out and the area feels much better. He still feels like there is still some cyst accumulation. He denies redness, swelling, or purulent exudate.  Outpatient Medications Prior to Visit  Medication Sig Dispense Refill  . Aclidinium Bromide (TUDORZA PRESSAIR) 400 MCG/ACT AEPB Inhale 1 puff into the lungs 2 (two) times daily. 1 each 11  . aspirin EC 81 MG tablet Take 81 mg by mouth daily.    Marland Kitchen ezetimibe (ZETIA) 10 MG tablet Take 1 tablet (10 mg total) by mouth daily. 90 tablet 3  . hydrochlorothiazide (MICROZIDE) 12.5 MG capsule TAKE 2 CAPSULES BY MOUTH EVERY DAY 180 capsule 3  . Insulin Glargine (TOUJEO SOLOSTAR) 300 UNIT/ML SOPN Inject 50 Units into the skin daily. 1.5 mL 11  . Insulin Pen Needle (BD PEN NEEDLE NANO U/F) 32G X 4 MM MISC use as directed with INSULIN 100 each 4  . losartan (COZAAR) 100 MG tablet TAKE 1 TABLET(100 MG) BY MOUTH DAILY BEFORE BREAKFAST 30 tablet 0  . metFORMIN (GLUCOPHAGE) 1000 MG tablet TAKE 1 TABLET(1000 MG) BY MOUTH TWICE DAILY WITH A MEAL 180 tablet 0  . metoprolol succinate (TOPROL-XL) 50 MG 24 hr tablet TAKE 1 TABLET BY MOUTH EVERY DAY WITH OR IMMEDIATELY FOLLOWING A MEAL 30 tablet 0  . mometasone (NASONEX) 50 MCG/ACT nasal spray Place 2 sprays into the nose daily. 17 g 0  . nitroGLYCERIN (NITROSTAT) 0.4 MG SL tablet Place 1 tablet (0.4 mg total) under the tongue every 5 (five) minutes as needed for chest pain. (Patient not taking: Reported on 06/14/2016) 25 tablet 0  . rosuvastatin (CRESTOR) 40 MG tablet Take 1 tablet (40 mg total) by mouth daily. 30 tablet 11  . tamsulosin (FLOMAX) 0.4 MG CAPS capsule TAKE 1 CAPSULE(0.4 MG) BY MOUTH DAILY AFTER DINNER 90 capsule 0  . traMADol (ULTRAM) 50 MG tablet Take 1 tablet (50 mg  total) by mouth every 8 (eight) hours as needed. 65 tablet 2   No facility-administered medications prior to visit.     ROS Review of Systems  All other systems reviewed and are negative.   Objective:  BP 122/68 (BP Location: Left Arm, Patient Position: Sitting, Cuff Size: Normal)   Pulse 70   Temp 98.3 F (36.8 C) (Oral)   Resp 16   Ht 5\' 5"  (1.651 m)   Wt 150 lb 12 oz (68.4 kg)   SpO2 96%   BMI 25.09 kg/m   BP Readings from Last 3 Encounters:  06/14/16 122/68  06/07/16 (!) 152/64  05/10/16 (!) 148/72    Wt Readings from Last 3 Encounters:  06/14/16 150 lb 12 oz (68.4 kg)  06/07/16 147 lb 8 oz (66.9 kg)  05/10/16 142 lb (64.4 kg)    Physical Exam  Pulmonary/Chest:      Lab Results  Component Value Date   WBC 5.8 02/07/2016   HGB 13.8 02/07/2016   HCT 41.1 02/07/2016   PLT 166.0 02/07/2016   GLUCOSE 262 (H) 02/07/2016   CHOL 161 05/29/2016   TRIG 75 05/29/2016   HDL 103 05/29/2016   LDLCALC 43 05/29/2016   ALT 22 05/29/2016   AST 27 05/29/2016   NA 137 02/07/2016   K 3.9 02/07/2016  CL 101 02/07/2016   CREATININE 1.66 (H) 02/07/2016   BUN 27 (H) 02/07/2016   CO2 27 02/07/2016   TSH 0.99 02/07/2016   PSA 4.68 (H) 02/07/2016   INR 1.07 02/03/2013   HGBA1C 7.9 (H) 02/07/2016   MICROALBUR 10.1 (H) 02/07/2016    No results found.  Assessment & Plan:   Lyam was seen today for wound check.  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 -     Ambulatory referral to General Surgery  Need for prophylactic vaccination and inoculation against influenza -     Flu vaccine HIGH DOSE PF (Fluzone High dose)   I am having Mr. Peavler maintain his aspirin EC, Aclidinium Bromide, Insulin Pen Needle, traMADol, mometasone,  hydrochlorothiazide, ezetimibe, Insulin Glargine, nitroGLYCERIN, metFORMIN, tamsulosin, rosuvastatin, losartan, and metoprolol succinate.  No orders of the defined types were placed in this encounter.    Follow-up: Return if symptoms worsen or fail to improve.  Scarlette Calico, MD

## 2016-06-14 NOTE — Progress Notes (Signed)
Pre visit review using our clinic review tool, if applicable. No additional management support is needed unless otherwise documented below in the visit note. 

## 2016-06-28 ENCOUNTER — Telehealth: Payer: Self-pay | Admitting: *Deleted

## 2016-06-28 NOTE — Telephone Encounter (Signed)
Cut the insulin by 1/2 There is still some cyst that may need to be removed

## 2016-06-28 NOTE — Telephone Encounter (Signed)
Notified pt w/md RESPONSE.../LMB

## 2016-06-28 NOTE — Telephone Encounter (Signed)
Rec'd call pt states when he take the [facility] he get confused & have a lot of sweating at night. He thinks he is taking too much wanting to see if he can lower the dosage. He states his BS been running in the low 100's past 3-4 days. Also want to know does he need to go to [facility] MD has already removed cyst.../lmb

## 2016-07-05 DIAGNOSIS — D369 Benign neoplasm, unspecified site: Secondary | ICD-10-CM | POA: Diagnosis not present

## 2016-07-19 ENCOUNTER — Other Ambulatory Visit: Payer: Self-pay | Admitting: Cardiology

## 2016-07-19 DIAGNOSIS — I10 Essential (primary) hypertension: Secondary | ICD-10-CM

## 2016-07-20 ENCOUNTER — Other Ambulatory Visit: Payer: Self-pay | Admitting: *Deleted

## 2016-07-20 MED ORDER — MOMETASONE FUROATE 50 MCG/ACT NA SUSP: 2.0000 | Freq: Every day | NASAL | 3 refills | Status: DC

## 2016-07-20 NOTE — Telephone Encounter (Signed)
REFILL 

## 2016-08-08 DIAGNOSIS — Z79899 Other long term (current) drug therapy: Secondary | ICD-10-CM | POA: Diagnosis not present

## 2016-08-08 DIAGNOSIS — E785 Hyperlipidemia, unspecified: Secondary | ICD-10-CM | POA: Diagnosis not present

## 2016-08-08 LAB — HEPATIC FUNCTION PANEL
ALK PHOS: 68 U/L (ref 40–115)
ALT: 19 U/L (ref 9–46)
AST: 26 U/L (ref 10–35)
Albumin: 4 g/dL (ref 3.6–5.1)
BILIRUBIN DIRECT: 0.1 mg/dL (ref <=0.2)
BILIRUBIN INDIRECT: 0.3 mg/dL (ref 0.2–1.2)
TOTAL PROTEIN: 6.7 g/dL (ref 6.1–8.1)
Total Bilirubin: 0.4 mg/dL (ref 0.2–1.2)

## 2016-08-08 LAB — LIPID PANEL
CHOL/HDL RATIO: 2.1 ratio (ref <5.0)
Cholesterol: 123 mg/dL (ref <200)
HDL: 59 mg/dL (ref >40)
LDL Cholesterol: 47 mg/dL (ref <100)
TRIGLYCERIDES: 83 mg/dL (ref <150)
VLDL: 17 mg/dL (ref <30)

## 2016-08-19 ENCOUNTER — Ambulatory Visit (INDEPENDENT_AMBULATORY_CARE_PROVIDER_SITE_OTHER): Payer: Medicare Other | Admitting: Physician Assistant

## 2016-08-19 ENCOUNTER — Encounter: Payer: Self-pay | Admitting: Physician Assistant

## 2016-08-19 VITALS — BP 134/78 | HR 94 | Temp 98.9°F | Wt 142.5 lb

## 2016-08-19 DIAGNOSIS — M109 Gout, unspecified: Secondary | ICD-10-CM | POA: Diagnosis not present

## 2016-08-19 DIAGNOSIS — R739 Hyperglycemia, unspecified: Secondary | ICD-10-CM | POA: Diagnosis not present

## 2016-08-19 LAB — GLUCOSE, POCT (MANUAL RESULT ENTRY): POC Glucose: 163 mg/dl — AB (ref 70–99)

## 2016-08-19 MED ORDER — PREDNISONE 20 MG PO TABS: 20.0000 mg | ORAL_TABLET | Freq: Two times a day (BID) | ORAL | 0 refills | Status: DC

## 2016-08-19 MED ORDER — PREDNISONE 20 MG PO TABS: 20.0000 mg | ORAL_TABLET | Freq: Every day | ORAL | 0 refills | Status: DC

## 2016-08-19 NOTE — Progress Notes (Signed)
Patient presents to clinic today c/o 2 days of redness, warmth and pain of R 5th MTP. Denies trauma or injury. Denies numbness/tingling. Denies bite or scratch to the area. Denies known history of gout or pseudogout. Does note significant red meat consumption recently. Is also on thiazide diuretic. Endorses pain with ROM without decreased ROM. Denies fever, chills, malaise or fatigue.  Past Medical History:  Diagnosis Date  . Allergy   . Arthritis    Right Hip  . Atrial fibrillation ([place])   . COPD (chronic obstructive pulmonary disease) ([place])   . Coronary artery disease    Lexiscan Myoview 7/14:  Inf scar with minimal reversibility, inf HK, EF 45%;  Low risk.  Promus LAD stent placed in [place].  100% RCA  . Diabetes mellitus without complication ([place])   . Dyslipidemia   . GERD (gastroesophageal reflux disease)    has used Zantac in the past   . Hernia   . Hyperlipidemia   . Hypertension   . Peripheral vascular disease ([place])    a. s/p R fem-pop BPG 01/2013 (Dr. [name])  . 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 Aclidinium Bromide (TUDORZA PRESSAIR) 400 MCG/ACT AEPB Inhale 1 puff into the lungs 2 (two) times daily. 1 each 11  . aspirin EC 81 MG tablet Take 81 mg by mouth daily.    Marland Kitchen ezetimibe (ZETIA) 10 MG tablet Take 1 tablet (10 mg total) by mouth daily. 90 tablet 3  . hydrochlorothiazide (MICROZIDE) 12.5 MG capsule TAKE 2 CAPSULES BY MOUTH EVERY DAY 180 capsule 3  . Insulin Glargine (TOUJEO SOLOSTAR) 300 UNIT/ML SOPN Inject 50 Units into the skin daily. 1.5 mL 11  . Insulin Pen Needle (BD PEN NEEDLE NANO U/F) 32G X 4 MM MISC use as directed with INSULIN 100 each 4  . losartan (COZAAR) 100 MG tablet TAKE 1 TABLET(100 MG) BY MOUTH DAILY BEFORE BREAKFAST 90 tablet 3  . metFORMIN (GLUCOPHAGE) 1000 MG tablet TAKE 1 TABLET(1000 MG) BY MOUTH TWICE DAILY WITH A MEAL 180 tablet 0  . metoprolol  succinate (TOPROL-XL) 50 MG 24 hr tablet TAKE 1 TABLET BY MOUTH EVERY DAY WITH OR IMMEDIATELY FOLLOWING A MEAL 30 tablet 0  . mometasone (NASONEX) 50 MCG/ACT nasal spray Place 2 sprays into the nose daily. 17 g 3  . rosuvastatin (CRESTOR) 40 MG tablet Take 1 tablet (40 mg total) by mouth daily. 30 tablet 11  . tamsulosin (FLOMAX) 0.4 MG CAPS capsule TAKE 1 CAPSULE(0.4 MG) BY MOUTH DAILY AFTER DINNER 90 capsule 0  . traMADol (ULTRAM) 50 MG tablet Take 1 tablet (50 mg total) by mouth every 8 (eight) hours as needed. 65 tablet 2  . nitroGLYCERIN (NITROSTAT) 0.4 MG SL tablet Place 1 tablet (0.4 mg total) under the tongue every 5 (five) minutes as needed for chest pain. (Patient not taking: Reported on 06/14/2016) 25 tablet 0   No current facility-administered medications on file prior to visit.     No Known Allergies  Family History  Problem Relation Age of Onset  . Diabetes Mother   . Hypertension Mother   . Heart disease Mother   . Hyperlipidemia Mother   . Diabetes Father   . Heart disease Father   . Hyperlipidemia Father   . Hypertension Father   . Diabetes Sister   . Heart disease Sister   . Hyperlipidemia Sister   .  Hypertension Sister   . Diabetes Brother   . Heart disease Brother   . Hyperlipidemia Brother   . Hypertension Brother   . Diabetes Son   . Heart disease Son   . Hyperlipidemia Son   . Hypertension Son   . Alcohol abuse Neg Hx   . Cancer Neg Hx   . Depression Neg Hx   . Early death Neg Hx   . Kidney disease Neg Hx   . Stroke Neg Hx   . Colon cancer Neg Hx   . Esophageal cancer Neg Hx   . Rectal cancer Neg Hx   . Stomach cancer Neg Hx     Social History   Social History  . Marital status: Married    Spouse name: N/A  . Number of children: 2  . Years of education: N/A   Occupational History  . retired    Social History Main Topics  . Smoking status: Former Smoker    Types: Cigarettes    Quit date: 02/04/2011  . Smokeless tobacco: Never Used      Comment: Quit tobacco 2 years ago.    . Alcohol use 1.8 oz/week    3 Cans of beer per week     Comment: everyday, beer 40 ozs & alcohol- 1-2 shots   . Drug use: No  . Sexual activity: Yes   Other Topics Concern  . None   Social History Narrative   Five children living.  Lives with wife.      Review of Systems - See HPI.  All other ROS are negative.  BP 134/78   Pulse 94   Temp 98.9 F (37.2 C) (Oral)   Wt 142 lb 8 oz (64.6 kg)   SpO2 97%   BMI 23.71 kg/m   Physical Exam  Constitutional: He is oriented to person, place, and time and well-developed, well-nourished, and in no distress.  HENT:  Head: Normocephalic and atraumatic.  Eyes: Conjunctivae are normal.  Cardiovascular: Normal rate, regular rhythm, normal heart sounds and intact distal pulses.   Pulmonary/Chest: Effort normal.  Musculoskeletal:       Right hand: Normal sensation noted. Normal strength noted.       Hands: Neurological: He is alert and oriented to person, place, and time.  Skin: Skin is warm and dry.  Vitals reviewed.   Recent Results (from the past 2160 hour(s))  Lipid Profile     Status: None   Collection Time: 05/29/16  8:24 AM  Result Value Ref Range   Cholesterol 161 <200 mg/dL    Comment: ** Please note change in reference range(s). **      Triglycerides 75 <150 mg/dL    Comment: ** Please note change in reference range(s). **      HDL 103 >40 mg/dL    Comment: ** Please note change in reference range(s). **      Total CHOL/HDL Ratio 1.6 <5.0 Ratio   VLDL 15 <30 mg/dL   LDL Cholesterol 43 mg/dL    Comment: ** Please note change in reference range(s). **     Hepatic function panel     Status: None   Collection Time: 05/29/16  8:24 AM  Result Value Ref Range   Total Bilirubin 0.5 0.2 - 1.2 mg/dL   Bilirubin, Direct 0.1 <=0.2 mg/dL   Indirect Bilirubin 0.4 0.2 - 1.2 mg/dL   Alkaline Phosphatase 68 40 - 115 U/L   AST 27 10 - 35 U/L   ALT 22 9 -  46 U/L   Total Protein 6.9 6.1  - 8.1 g/dL   Albumin 4.2 3.6 - 5.1 g/dL  Hepatic function panel     Status: None   Collection Time: 08/08/16  7:29 AM  Result Value Ref Range   Total Bilirubin 0.4 0.2 - 1.2 mg/dL   Bilirubin, Direct 0.1 <=0.2 mg/dL   Indirect Bilirubin 0.3 0.2 - 1.2 mg/dL   Alkaline Phosphatase 68 40 - 115 U/L   AST 26 10 - 35 U/L   ALT 19 9 - 46 U/L   Total Protein 6.7 6.1 - 8.1 g/dL   Albumin 4.0 3.6 - 5.1 g/dL  Lipid panel     Status: None   Collection Time: 08/08/16  7:29 AM  Result Value Ref Range   Cholesterol 123 <200 mg/dL   Triglycerides 83 <150 mg/dL   HDL 59 >40 mg/dL   Total CHOL/HDL Ratio 2.1 <5.0 Ratio   VLDL 17 <30 mg/dL   LDL Cholesterol 47 <100 mg/dL    Assessment/Plan: 1. Acute gout of right hand, unspecified cause In patient on HCTZ with recent consumption of trigger foods for gout. 5th L MCP affected. No evidence of induration to suspect cellulitis versus gout/pseudogout. Patient with DM endorsing controlled fasting sugars at home. Non-fasting glucose today < 170. Patient also with CKD and elderly so wish to avoid use of colchicine and indomethacin. Will start 3 days course of prednisone 20 mg BID. Pain medication and supportive measures including gout diet reviewed. FU scheduled in 2 days with PCP for reassessment and further management.    2. Hyperglycemia poc non-fasting gluose < 170. Checked to determine treatment for # 1.  - POCT Glucose (CBG)   Leeanne Rio, PA-C

## 2016-08-19 NOTE — Patient Instructions (Addendum)
I am starting you on prednisone twice daily for 3 days to start the resolution of gout flare.  Keep close eye on sugar levels. If rising above 250 on steroid, stop and call the office.   Ice the hand.  Please follow-up with Dr. Ronnald Ramp on Monday for repeat assessment.  If you note any worsening symptoms on this regimen, please go to the ER.   Gout Introduction Gout is painful swelling that can happen in some of your joints. Gout is a type of arthritis. This condition is caused by having too much uric acid in your body. Uric acid is a chemical that is made when your body breaks down substances called purines. If your body has too much uric acid, sharp crystals can form and build up in your joints. This causes pain and swelling. Gout attacks can happen quickly and be very painful (acute gout). Over time, the attacks can affect more joints and happen more often (chronic gout). Follow these instructions at home: During a Gout Attack  If directed, put ice on the painful area:  Put ice in a plastic bag.  Place a towel between your skin and the bag.  Leave the ice on for 20 minutes, 2-3 times a day.  Rest the joint as much as possible. If the joint is in your leg, you may be given crutches to use.  Raise (elevate) the painful joint above the level of your heart as often as you can.  Drink enough fluids to keep your pee (urine) clear or pale yellow.  Take over-the-counter and prescription medicines only as told by your doctor.  Do not drive or use heavy machinery while taking prescription pain medicine.  Follow instructions from your doctor about what you can or cannot eat and drink.  Return to your normal activities as told by your doctor. Ask your doctor what activities are safe for you. Avoiding Future Gout Attacks  Follow a low-purine diet as told by a specialist (dietitian) or your doctor. Avoid foods and drinks that have a lot of purines, such  as:  Liver.  Kidney.  Anchovies.  Asparagus.  Herring.  Mushrooms  Mussels.  Beer.  Limit alcohol intake to no more than 1 drink a day for nonpregnant women and 2 drinks a day for men. One drink equals 12 oz of beer, 5 oz of wine, or 1 oz of hard liquor.  Stay at a healthy weight or lose weight if you are overweight. If you want to lose weight, talk with your doctor. It is important that you do not lose weight too fast.  Start or continue an exercise plan as told by your doctor.  Drink enough fluids to keep your pee clear or pale yellow.  Take over-the-counter and prescription medicines only as told by your doctor.  Keep all follow-up visits as told by your doctor. This is important. Contact a doctor if:  You have another gout attack.  You still have symptoms of a gout attack after10 days of treatment.  You have problems (side effects) because of your medicines.  You have chills or a fever.  You have burning pain when you pee (urinate).  You have pain in your lower back or belly. Get help right away if:  You have very bad pain.  Your pain cannot be controlled.  You cannot pee. This information is not intended to replace advice given to you by your health care provider. Make sure you discuss any questions you have with your  health care provider. Document Released: 04/18/2008 Document Revised: 12/16/2015 Document Reviewed: 04/22/2015  2017 [employer]

## 2016-08-19 NOTE — Telephone Encounter (Signed)
Patient states due to location he would like to relocate, advised patient RN will call him back on Monday.

## 2016-08-21 ENCOUNTER — Other Ambulatory Visit: Payer: Self-pay | Admitting: Internal Medicine

## 2016-08-21 ENCOUNTER — Telehealth: Payer: Self-pay | Admitting: Medical

## 2016-08-21 DIAGNOSIS — E119 Type 2 diabetes mellitus without complications: Secondary | ICD-10-CM

## 2016-08-21 NOTE — Telephone Encounter (Signed)
Pt would like to transfer from Dr. Ronnald Ramp to [name].  Please call and schedule an office visit.

## 2016-08-21 NOTE — Telephone Encounter (Signed)
Called patient. He has recently moved to [facility] and would like for [name] to be his primary provider.  He stated that he would not mind if [name] referred him to an endocrinologist.  He said he saw Einar Pheasant on the 08/19/16 and has a follow up appt with Dr. Ronnald Ramp tomorrow, but would like to proceed with scheduling an appt here to establish with [name].    Please advise.

## 2016-08-21 NOTE — Telephone Encounter (Addendum)
Referral to endocrinologist placed. Maybe have him schedule with me in 2-3 months sooner if he needs

## 2016-08-22 ENCOUNTER — Ambulatory Visit (INDEPENDENT_AMBULATORY_CARE_PROVIDER_SITE_OTHER): Payer: Medicare Other | Admitting: Internal Medicine

## 2016-08-22 ENCOUNTER — Other Ambulatory Visit (INDEPENDENT_AMBULATORY_CARE_PROVIDER_SITE_OTHER): Payer: Medicare Other

## 2016-08-22 ENCOUNTER — Encounter: Payer: Self-pay | Admitting: Internal Medicine

## 2016-08-22 VITALS — BP 150/58 | HR 76 | Temp 98.3°F | Resp 16 | Wt 145.0 lb

## 2016-08-22 DIAGNOSIS — E109 Type 1 diabetes mellitus without complications: Secondary | ICD-10-CM | POA: Diagnosis not present

## 2016-08-22 DIAGNOSIS — E139 Other specified diabetes mellitus without complications: Secondary | ICD-10-CM

## 2016-08-22 DIAGNOSIS — R972 Elevated prostate specific antigen [PSA]: Secondary | ICD-10-CM

## 2016-08-22 DIAGNOSIS — I10 Essential (primary) hypertension: Secondary | ICD-10-CM

## 2016-08-22 DIAGNOSIS — I70219 Atherosclerosis of native arteries of extremities with intermittent claudication, unspecified extremity: Secondary | ICD-10-CM

## 2016-08-22 MED ORDER — INSULIN GLARGINE 300 UNIT/ML ~~LOC~~ SOPN: 50.0000 [IU] | PEN_INJECTOR | Freq: Every day | SUBCUTANEOUS | 11 refills | Status: DC

## 2016-08-22 NOTE — Telephone Encounter (Signed)
lvm advising patient to return call and schedule new patient appointment

## 2016-08-22 NOTE — Patient Instructions (Signed)
Type 1 Diabetes Mellitus, Diagnosis, Adult Type 1 diabetes (type 1 diabetes mellitus) is a long-term (chronic) disease. It occurs when the pancreas does not make enough of a hormone called insulin. Normally, insulin allows sugars (glucose) to enter cells in the body. The cells use glucose for energy. Lack of insulin causes excess glucose to build up in the blood instead of going into cells. As a result, high blood glucose (hyperglycemia) develops. The exact cause of type 1 diabetes is not known. There is currently no cure for type 1 diabetes, but it can be managed with insulin treatment and lifestyle changes. What increases the risk? You may be more likely to develop this condition if you have a family member who has type 1 diabetes. Other factors may also make you more likely to develop type 1 diabetes, such as:  Having a gene for type 1 diabetes that is passed along from parent to child (inherited).  Living in an area with cold weather conditions.  Exposure to certain viruses.  Certain conditions in which the body's disease-fighting (immune) system attacks itself (autoimmune disorders).  What are the signs or symptoms? Symptoms may develop gradually, over days or weeks, or they may develop suddenly. Symptoms may include:  Increased thirst (polydipsia).  Increased hunger(polyphagia).  Increased urination (polyuria).  Increased urination during the night (nocturia).  Sudden or unexplained weight changes.  Frequent infections that keep coming back (recurring).  Fatigue.  Weakness.  Vision changes, such as blurry vision.  Fruity-smelling breath.  Cuts or bruises that are slow to heal.  Tingling or numbness in the hands or feet.  How is this diagnosed?  This condition is diagnosed based on your symptoms, your medical history, a physical exam, and your blood glucose level. Your blood glucose may be checked with one or more of the following blood tests:  A fasting blood  glucose (FBG) test. You will not be allowed to eat (you will fast) for at least 8 hours before a blood sample is taken.  A random blood glucose test. This checks blood glucose at any time of day regardless of when you ate.  An A1c (hemoglobin A1c) blood test. This provides information about blood glucose control over the previous 2-3 months.  You may be diagnosed with type 1 diabetes if:  Your FBG level is 126 mg/dL (7.0 mmol/L) or higher.  Your random blood glucose level is 200 mg/dL (11.1 mmol/L) or higher.  Your A1c level is 6.5% or higher.  These blood tests may be repeated to confirm your diagnosis. How is this treated? Your treatment may be managed by a specialist called an endocrinologist. Type 1 diabetes can be managed by following instructions from your health care provider about:  Taking insulin daily. This helps to keep your blood glucose levels in the healthy range. ? You may need to adjust your insulin dosage based on how physically active you are and what foods you eat. Your health care provider will tell you how to do this.  Taking medicines to help prevent complications from diabetes, such as: ? Aspirin. ? Medicine to lower cholesterol. ? Medicine to control blood pressure.  Checking your blood glucose as often as directed.  Making diet and lifestyle changes. These may include: ? Following an individualized nutrition plan that is developed by a diet and nutrition specialist (registered dietitian). ? Exercising regularly. ? Finding ways to manage stress.  Your health care provider will set individualized treatment goals for you. Your goals will be based on   your age, other medical conditions you have, and how you respond to diabetes treatment. Generally, the goal of treatment is to maintain the following blood glucose levels:  Before meals (preprandial): 80-130 mg/dL (4.4-7.2 mmol/L).  After meals (postprandial): below 180 mg/dL (10 mmol/L).  A1c level: less than  7%.  Follow these instructions at home: Questions to Ask Your Health Care Provider   Consider asking the following questions: ? Do I need to meet with a diabetes educator? ? Should I consider joining a support group for people with diabetes? ? What equipment will I need to manage my diabetes at home? ? What diabetes medicines should I take, and when? ? How often should I check my blood glucose? ? What number should I call if I have questions? ? When is my next appointment? General instructions  Take over-the-counter and prescription medicines only as told by your health care provider.  Keep all follow-up visits as told by your health care provider. This is important.  For more information about diabetes, visit: ? American Diabetes Association (ADA): www.diabetes.org ? American Association of Diabetes Educators (AADE): www.diabeteseducator.org/patient-resources Contact a health care provider if:  Your blood glucose level is higher than 240 mg/dL (13.3 mmol/L) for 2 days in a row.  You have been sick or have had a fever for 2 days or more and you are not getting better.  You have any of the following problems for more than 6 hours: ? You cannot eat or drink. ? You have nausea and vomiting. ? You have diarrhea. Get help right away if:  Your blood glucose is below 54 mg/dL (3 mmol/L).  You become confused or you have trouble thinking clearly.  You have difficulty breathing.  You have moderate or large ketone levels in your urine. This information is not intended to replace advice given to you by your health care provider. Make sure you discuss any questions you have with your health care provider. Document Released: 07/07/2000 Document Revised: 12/16/2015 Document Reviewed: 08/13/2015 Elsevier Interactive Patient Education  2017 Elsevier Inc.  

## 2016-08-22 NOTE — Progress Notes (Signed)
Subjective:  Patient ID: Steven [name], male    DOB: 1946/01/17  Age: 71 y.o. MRN: [id number]  CC: Hypertension and Diabetes   HPI KOLESON ATA presents for Follow-up on hypertension and diabetes. He complains of the last few weeks that he has had a dull achy discomfort in his left lower extremity. The pain is there rest but there is also some discomfort with activity. He has a history of significant peripheral artery disease.  His blood sugars have not been well controlled. He tells me he recently ran out of the basal insulin.  Outpatient Medications Prior to Visit  Medication Sig Dispense Refill  . aspirin EC 81 MG tablet Take 81 mg by mouth daily.    Marland Kitchen ezetimibe (ZETIA) 10 MG tablet Take 1 tablet (10 mg total) by mouth daily. 90 tablet 3  . Insulin Pen Needle (BD PEN NEEDLE NANO U/F) 32G X 4 MM MISC use as directed with INSULIN 100 each 4  . losartan (COZAAR) 100 MG tablet TAKE 1 TABLET(100 MG) BY MOUTH DAILY BEFORE BREAKFAST 90 tablet 3  . metFORMIN (GLUCOPHAGE) 1000 MG tablet TAKE 1 TABLET(1000 MG) BY MOUTH TWICE DAILY WITH A MEAL 180 tablet 0  . metoprolol succinate (TOPROL-XL) 50 MG 24 hr tablet TAKE 1 TABLET BY MOUTH EVERY DAY WITH OR IMMEDIATELY FOLLOWING A MEAL 30 tablet 0  . mometasone (NASONEX) 50 MCG/ACT nasal spray Place 2 sprays into the nose daily. 17 g 3  . rosuvastatin (CRESTOR) 40 MG tablet Take 1 tablet (40 mg total) by mouth daily. 30 tablet 11  . tamsulosin (FLOMAX) 0.4 MG CAPS capsule TAKE 1 CAPSULE(0.4 MG) BY MOUTH DAILY AFTER DINNER 90 capsule 0  . traMADol (ULTRAM) 50 MG tablet Take 1 tablet (50 mg total) by mouth every 8 (eight) hours as needed. 65 tablet 2  . Aclidinium Bromide (TUDORZA PRESSAIR) 400 MCG/ACT AEPB Inhale 1 puff into the lungs 2 (two) times daily. 1 each 11  . hydrochlorothiazide (MICROZIDE) 12.5 MG capsule TAKE 2 CAPSULES BY MOUTH EVERY DAY 180 capsule 3  . Insulin Glargine (TOUJEO SOLOSTAR) 300 UNIT/ML SOPN Inject 50 Units into the skin  daily. 1.5 mL 11  . nitroGLYCERIN (NITROSTAT) 0.4 MG SL tablet Place 1 tablet (0.4 mg total) under the tongue every 5 (five) minutes as needed for chest pain. (Patient not taking: Reported on 06/14/2016) 25 tablet 0  . predniSONE (DELTASONE) 20 MG tablet Take 1 tablet (20 mg total) by mouth 2 (two) times daily with a meal. (Patient not taking: Reported on 08/22/2016) 6 tablet 0   No facility-administered medications prior to visit.     ROS Review of Systems  Constitutional: Negative for appetite change, diaphoresis, fatigue and unexpected weight change.  HENT: Negative.   Eyes: Negative for visual disturbance.  Respiratory: Negative for cough, chest tightness, shortness of breath and wheezing.   Cardiovascular: Negative for chest pain, palpitations and leg swelling.  Gastrointestinal: Negative for abdominal pain, constipation, diarrhea, nausea and vomiting.  Endocrine: Negative for cold intolerance, heat intolerance, polydipsia, polyphagia and polyuria.  Genitourinary: Negative.  Negative for difficulty urinating, dysuria, hematuria and urgency.  Musculoskeletal: Negative.  Negative for back pain and myalgias.       LLE pain  Allergic/Immunologic: Negative.   Neurological: Negative.   Hematological: Negative for adenopathy. Does not bruise/bleed easily.  Psychiatric/Behavioral: Negative.     Objective:  BP (!) 150/58   Pulse 76   Temp 98.3 F (36.8 C) (Oral)   Resp 16  Wt 145 lb (65.8 kg)   SpO2 96%   BMI 24.13 kg/m   BP Readings from Last 3 Encounters:  08/22/16 (!) 150/58  08/19/16 134/78  06/14/16 122/68    Wt Readings from Last 3 Encounters:  08/22/16 145 lb (65.8 kg)  08/19/16 142 lb 8 oz (64.6 kg)  06/14/16 150 lb 12 oz (68.4 kg)    Physical Exam  Constitutional: He is oriented to person, place, and time. No distress.  HENT:  Mouth/Throat: Oropharynx is clear and moist. No oropharyngeal exudate.  Eyes: Conjunctivae are normal. Right eye exhibits no discharge.  Left eye exhibits no discharge. No scleral icterus.  Neck: Normal range of motion. Neck supple. No JVD present. No tracheal deviation present. No thyromegaly present.  Cardiovascular: Normal rate, regular rhythm, normal heart sounds and intact distal pulses.  Exam reveals no gallop and no friction rub.   No murmur heard. Pulmonary/Chest: Effort normal and breath sounds normal. No stridor. No respiratory distress. He has no wheezes. He has no rales. He exhibits no tenderness.  Abdominal: Soft. Bowel sounds are normal. He exhibits no distension and no mass. There is no tenderness. There is no rebound and no guarding.  Musculoskeletal: Normal range of motion. He exhibits no edema, tenderness or deformity.  Lymphadenopathy:    He has no cervical adenopathy.  Neurological: He is oriented to person, place, and time.  Skin: Skin is warm and dry. No rash noted. He is not diaphoretic. No erythema. No pallor.  Vitals reviewed.   Lab Results  Component Value Date   WBC 5.8 02/07/2016   HGB 13.8 02/07/2016   HCT 41.1 02/07/2016   PLT 166.0 02/07/2016   GLUCOSE 440 (H) 08/22/2016   CHOL 123 08/08/2016   TRIG 83 08/08/2016   HDL 59 08/08/2016   LDLCALC 47 08/08/2016   ALT 19 08/08/2016   AST 26 08/08/2016   NA 138 08/22/2016   K 4.0 08/22/2016   CL 102 08/22/2016   CREATININE 1.79 (H) 08/22/2016   BUN 28 (H) 08/22/2016   CO2 29 08/22/2016   TSH 0.99 02/07/2016   PSA 3.12 08/22/2016   INR 1.07 02/03/2013   HGBA1C 8.2 (H) 08/22/2016   MICROALBUR 10.1 (H) 02/07/2016    No results found.  Assessment & Plan:   Zaequan was seen today for hypertension and diabetes.  Diagnoses and all orders for this visit:  Diabetes 1.5, managed as type 1 ([place])- his A1c is up to 8.2%. Will restart his basal insulin and continue metformin. -     Insulin Glargine (TOUJEO SOLOSTAR) 300 UNIT/ML SOPN; Inject 50 Units into the skin daily. -     Basic metabolic panel; Future -     Hemoglobin A1c;  Future  Atherosclerotic PVD with intermittent claudication ([place])- I've asked him to follow with vascular surgery to see if he needs to have an up stated set of ABIs done. -     Ambulatory referral to Vascular Surgery  Essential hypertension, benign- his blood pressure is adequately well-controlled, electrolytes and renal function are normal. -     Basic metabolic panel; Future  PSA elevation- his PSA has trended down over the last year so am not concerned about prostate cancer. I will continue to monitor this in the future. -     PSA; Future   I have discontinued Mr. Batts Aclidinium Bromide, hydrochlorothiazide, and predniSONE. I am also having him maintain his aspirin EC, Insulin Pen Needle, traMADol, ezetimibe, nitroGLYCERIN, tamsulosin, rosuvastatin, metoprolol succinate, losartan,  mometasone, metFORMIN, and Insulin Glargine.  Meds ordered this encounter  Medications  . Insulin Glargine (TOUJEO SOLOSTAR) 300 UNIT/ML SOPN    Sig: Inject 50 Units into the skin daily.    Dispense:  1.5 mL    Refill:  11     Follow-up: Return in about 4 months (around 12/20/2016).  Scarlette Calico, MD

## 2016-08-22 NOTE — Progress Notes (Signed)
Pre visit review using our clinic review tool, if applicable. No additional management support is needed unless otherwise documented below in the visit note. 

## 2016-08-23 ENCOUNTER — Encounter: Payer: Self-pay | Admitting: Internal Medicine

## 2016-08-23 LAB — HEMOGLOBIN A1C: HEMOGLOBIN A1C: 8.2 % — AB (ref 4.6–6.5)

## 2016-08-23 LAB — BASIC METABOLIC PANEL
BUN: 28 mg/dL — ABNORMAL HIGH (ref 6–23)
CHLORIDE: 102 meq/L (ref 96–112)
CO2: 29 mEq/L (ref 19–32)
Calcium: 10.3 mg/dL (ref 8.4–10.5)
Creatinine, Ser: 1.79 mg/dL — ABNORMAL HIGH (ref 0.40–1.50)
GFR: 48.75 mL/min — ABNORMAL LOW (ref >60.00)
Glucose, Bld: 440 mg/dL — ABNORMAL HIGH (ref 70–99)
Potassium: 4 mEq/L (ref 3.5–5.1)
Sodium: 138 mEq/L (ref 135–145)

## 2016-08-23 LAB — PSA: PSA: 3.12 ng/mL (ref 0.10–4.00)

## 2016-08-28 ENCOUNTER — Other Ambulatory Visit: Payer: Self-pay

## 2016-08-28 ENCOUNTER — Telehealth: Payer: Self-pay

## 2016-08-28 ENCOUNTER — Other Ambulatory Visit: Payer: Self-pay | Admitting: Internal Medicine

## 2016-08-28 DIAGNOSIS — Z95828 Presence of other vascular implants and grafts: Secondary | ICD-10-CM

## 2016-08-28 DIAGNOSIS — I739 Peripheral vascular disease, unspecified: Secondary | ICD-10-CM

## 2016-08-28 NOTE — Telephone Encounter (Signed)
LVM for pt to inform him that I made him an appt on 2/14 @ 2:30 for the referral I received from [facility]

## 2016-08-31 ENCOUNTER — Encounter: Payer: Self-pay | Admitting: Family

## 2016-09-06 ENCOUNTER — Ambulatory Visit (INDEPENDENT_AMBULATORY_CARE_PROVIDER_SITE_OTHER)
Admission: RE | Admit: 2016-09-06 | Discharge: 2016-09-06 | Disposition: A | Discharge status: Home / Self Care | Payer: Medicare Other | Source: Ambulatory Visit | Attending: Family | Admitting: Family

## 2016-09-06 ENCOUNTER — Ambulatory Visit (HOSPITAL_COMMUNITY)
Admission: RE | Admit: 2016-09-06 | Discharge: 2016-09-06 | Disposition: A | Discharge status: Home / Self Care | Payer: Medicare Other | Source: Ambulatory Visit | Attending: Family | Admitting: Family

## 2016-09-06 ENCOUNTER — Encounter: Payer: Self-pay | Admitting: Family

## 2016-09-06 ENCOUNTER — Ambulatory Visit (INDEPENDENT_AMBULATORY_CARE_PROVIDER_SITE_OTHER): Payer: Medicare Other | Admitting: Family

## 2016-09-06 VITALS — BP 161/78 | HR 66 | Temp 98.8°F | Resp 16 | Ht 65.0 in | Wt 142.0 lb

## 2016-09-06 DIAGNOSIS — R0989 Other specified symptoms and signs involving the circulatory and respiratory systems: Secondary | ICD-10-CM

## 2016-09-06 DIAGNOSIS — I739 Peripheral vascular disease, unspecified: Secondary | ICD-10-CM | POA: Insufficient documentation

## 2016-09-06 DIAGNOSIS — I743 Embolism and thrombosis of arteries of the lower extremities: Secondary | ICD-10-CM | POA: Insufficient documentation

## 2016-09-06 DIAGNOSIS — R9439 Abnormal result of other cardiovascular function study: Secondary | ICD-10-CM | POA: Insufficient documentation

## 2016-09-06 DIAGNOSIS — I70212 Atherosclerosis of native arteries of extremities with intermittent claudication, left leg: Secondary | ICD-10-CM

## 2016-09-06 DIAGNOSIS — Z95828 Presence of other vascular implants and grafts: Secondary | ICD-10-CM

## 2016-09-06 DIAGNOSIS — I70219 Atherosclerosis of native arteries of extremities with intermittent claudication, unspecified extremity: Secondary | ICD-10-CM

## 2016-09-06 DIAGNOSIS — Z87891 Personal history of nicotine dependence: Secondary | ICD-10-CM

## 2016-09-06 NOTE — Patient Instructions (Signed)

## 2016-09-06 NOTE — Progress Notes (Signed)
[facility] [place]   CC: Follow up peripheral artery occlusive disease  History of Present Illness GRAINGER LOMBOY is a 71 y.o. male patient of Dr. Kellie Simmering who returns status post right femoral-popliteal bypass with saphenous vein graft in July of 2014 for severe claudication right leg. He is having no right leg claudication symptoms. He denies non healing wounds.  He has right hip pain if he walks fast or a long distance, denies right hip pain in the morning. He also has left calf tightness when he walks fast, relieved by rest, states this is how his right leg felt before the bypass in his right leg.   He returns today with c/o left foot feeling heavy after walking fast at 50 feet, relieved by rest. He denies left foot feeling heavy if he walks at a normal pace; also he wears steel toed boots. Dr. Ronnald Ramp, his PCP, referred him for this.  He has CAD, sees a cardiologist.  Pt Diabetic: Yes, Review of records: A1C on 08-22-16 was 8.2, serum creatinine was 1.79 at that time; A1C was 7.9 on 02/07/16 Pt smoker: former smoker, quit in 2012  Pt meds include: Statin: yes, resumed June 2016 Betablocker: Yes ASA: Yes Other anticoagulants/antiplatelets: no       Past Medical History:  Diagnosis Date  . Allergy   . Arthritis    Right Hip  . Atrial fibrillation ([place])   . COPD (chronic obstructive pulmonary disease) ([place])   . Coronary artery disease    Lexiscan Myoview 7/14:  Inf scar with minimal reversibility, inf HK, EF 45%;  Low risk.  Promus LAD stent placed in [place].  100% RCA  . Diabetes mellitus without complication ([place])   . Dyslipidemia   . GERD (gastroesophageal reflux disease)    has used Zantac in the past   . Hernia   . Hyperlipidemia   . Hypertension   . Peripheral vascular disease ([place])    a. s/p R fem-pop BPG 01/2013 (Dr. [name])  . 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    Social History Social History  Substance Use Topics  . Smoking  status: Former Smoker    Types: Cigarettes    Quit date: 02/04/2011  . Smokeless tobacco: Never Used     Comment: Quit tobacco 2 years ago.    . Alcohol use 1.8 oz/week    3 Cans of beer per week     Comment: everyday, beer 40 ozs & alcohol- 1-2 shots     Family History Family History  Problem Relation Age of Onset  . Diabetes Mother   . Hypertension Mother   . Heart disease Mother   . Hyperlipidemia Mother   . Diabetes Father   . Heart disease Father   . Hyperlipidemia Father   . Hypertension Father   . Diabetes Sister   . Heart disease Sister   . Hyperlipidemia Sister   . Hypertension Sister   . Diabetes Brother   . Heart disease Brother   . Hyperlipidemia Brother   . Hypertension Brother   . Diabetes Son   . Heart disease Son   . Hyperlipidemia Son   . Hypertension Son   . Alcohol abuse Neg Hx   . Cancer Neg Hx   . Depression Neg Hx   . Early death Neg Hx   . Kidney disease Neg Hx   . Stroke Neg Hx   . Colon cancer Neg Hx   . Esophageal cancer  Neg Hx   . Rectal cancer Neg Hx   . Stomach cancer Neg Hx     Past Surgical History:  Procedure Laterality Date  . ABDOMINAL AORTAGRAM N/A 12/10/2012   Procedure: ABDOMINAL Maxcine Ham;  Surgeon: Serafina Mitchell, MD;  Location[facility] CATH LAB;  Service: Cardiovascular;  Laterality: N/A;  . CARDIAC CATHETERIZATION     with stent placement  . COLONOSCOPY  March 2014  . FEMORAL-POPLITEAL BYPASS GRAFT Right 02/07/2013   Procedure: BYPASS GRAFT FEMORAL-POPLITEAL ARTERY- RIGHT;  Surgeon: Mal Misty, MD;  Location: [facility] OR;  Service: Vascular;  Laterality: Right;  Right femoral to below knee popliteal bypass using right non reversed great saphenous vein  . INGUINAL HERNIA REPAIR  2010   Right  . INTRAOPERATIVE ARTERIOGRAM Right 02/07/2013   Procedure: Attempted INTRA OPERATIVE ARTERIOGRAM;  Surgeon: Mal Misty, MD;  Location: [facility] OR;  Service: Vascular;  Laterality: Right;    No Known Allergies  Current Outpatient  Prescriptions  Medication Sig Dispense Refill  . aspirin EC 81 MG tablet Take 81 mg by mouth daily.    Marland Kitchen ezetimibe (ZETIA) 10 MG tablet Take 1 tablet (10 mg total) by mouth daily. 90 tablet 3  . Insulin Glargine (TOUJEO SOLOSTAR) 300 UNIT/ML SOPN Inject 50 Units into the skin daily. 1.5 mL 11  . Insulin Pen Needle (BD PEN NEEDLE NANO U/F) 32G X 4 MM MISC use as directed with INSULIN 100 each 4  . losartan (COZAAR) 100 MG tablet TAKE 1 TABLET(100 MG) BY MOUTH DAILY BEFORE BREAKFAST 90 tablet 3  . metFORMIN (GLUCOPHAGE) 1000 MG tablet TAKE 1 TABLET(1000 MG) BY MOUTH TWICE DAILY WITH A MEAL 180 tablet 0  . metoprolol succinate (TOPROL-XL) 50 MG 24 hr tablet TAKE 1 TABLET BY MOUTH EVERY DAY WITH OR IMMEDIATELY FOLLOWING A MEAL 30 tablet 0  . mometasone (NASONEX) 50 MCG/ACT nasal spray Place 2 sprays into the nose daily. 17 g 3  . tamsulosin (FLOMAX) 0.4 MG CAPS capsule TAKE 1 CAPSULE(0.4 MG) BY MOUTH DAILY AFTER DINNER 90 capsule 3  . traMADol (ULTRAM) 50 MG tablet Take 1 tablet (50 mg total) by mouth every 8 (eight) hours as needed. 65 tablet 2  . nitroGLYCERIN (NITROSTAT) 0.4 MG SL tablet Place 1 tablet (0.4 mg total) under the tongue every 5 (five) minutes as needed for chest pain. (Patient not taking: Reported on 06/14/2016) 25 tablet 0  . rosuvastatin (CRESTOR) 40 MG tablet Take 1 tablet (40 mg total) by mouth daily. 30 tablet 11   No current facility-administered medications for this visit.     ROS: See HPI for pertinent positives and negatives.   Physical Examination  Vitals:   09/06/16 1535  BP: (!) 161/78  Pulse: 66  Resp: 16  Temp: 98.8 F (37.1 C)  TempSrc: Oral  SpO2: 96%  Weight: 142 lb (64.4 kg)  Height: 5\' 5"  (1.651 m)   Body mass index is 23.63 kg/m.  General: A&O x 3, WDWN. Gait: normal Eyes: PERRLA. Pulmonary: Respirations are non labored, CTAB, without wheezes , rales, or rhonchi. Cardiac: regular rhythm and rate, no detected murmur.     Carotid  Bruits Right Left   Negative Positive   Aorta is not palpable. Radial pulses: are 2+ right and 1+ left palpable    VASCULAR EXAM: Extremitieswithout ischemic changes  without Gangrene; without open wounds.     LE Pulses Right Left   FEMORAL 3+ palpable 2+ palpable    POPLITEAL not palpable  not palpable  POSTERIOR TIBIAL 1+ palpable  not palpable    DORSALIS PEDIS  ANTERIOR TIBIAL not palpable  not palpable    Abdomen: soft, NT, no palpable masses. Skin: no rashes, no ulcers. Musculoskeletal: no muscle wasting or atrophy. Neurologic: A&O X 3; Appropriate Affect; MOTOR FUNCTION: moving all extremities equally, motor strength 5/5 throughout. Speech is fluent/normal. CN 2-12 intact.      ASSESSMENT: NASHAUN VIVERITO is a 71 y.o. male who is status post right femoral-popliteal bypass with saphenous vein graft in July of 2014 for severe claudication right leg. He is having no right leg claudication symptoms.  He walks a great deal as part of his job which facilitates the formation of collateral arterial perfusion. He has a new heavy feeling in his left foot when he walks fast, this limits his job.  He has no signs of ischemia in his feet or toes which are pink and warm. Left carotid bruit detected on today's exam, not present on last exam; he denies any known history of stroke or TIA; no carotid duplex results on file; see Plan.  He indicates that he has been referred to an endocrinologist later in February, 2018.   His atherosclerotic risk factors include slightly worsened control of DM, former smoker, elevated blood pressure, and CAD.   DATA Today's right LE arterial Duplex suggests inflow and outflow disease with inflow stenosis 50-74% (237 and 213 cm/s),  and outflow stenosis <50%. No significant change compared to the last exam on 05-10-16.  Today's ABI's: Right: 0.85 (0.89, 05-10-16), waveforms appear biphasic, TBI: 0.40 (declined form 0.78). Left: 0.40 (declined from 0.51), waveforms appear monophasic; TBI: 0.12 (declined from 0.59).    PLAN:  Based on the patient's vascular studies and examination, pt will return to clinic at Dr. Claretha Cooper next available, to discuss moderate decline in left LE ABI and TBI, worsening heavy feeling in left foot with walking fast which affects his job. Pt has CKD, his last serum creatinine result on file was 1.79 on 08-22-16. Carotid duplex due about October 2018 or sooner.  I advised him to return sooner if the pain in his left calf worsens.  I discussed in depth with the patient the nature of atherosclerosis, and emphasized the importance of maximal medical management including strict control of blood pressure, blood glucose, and lipid levels, obtaining regular exercise, and continued cessation of smoking.  The patient is aware that without maximal medical management the underlying atherosclerotic disease process will progress, limiting the benefit of any interventions.  The patient was given information about PAD including signs, symptoms, treatment, what symptoms should prompt the patient to seek immediate medical care, and risk reduction measures to take.  Clemon Chambers, RN, MSN, FNP-C [facility] Phone: [phone]  Clinic MD: Donzetta Matters  09/06/16 3:38 PM

## 2016-09-18 ENCOUNTER — Encounter: Payer: Self-pay | Admitting: Endocrinology

## 2016-09-18 ENCOUNTER — Ambulatory Visit (INDEPENDENT_AMBULATORY_CARE_PROVIDER_SITE_OTHER): Payer: Medicare Other | Admitting: Endocrinology

## 2016-09-18 VITALS — BP 132/70 | HR 83 | Ht 65.0 in | Wt 146.0 lb

## 2016-09-18 DIAGNOSIS — I70219 Atherosclerosis of native arteries of extremities with intermittent claudication, unspecified extremity: Secondary | ICD-10-CM | POA: Diagnosis not present

## 2016-09-18 DIAGNOSIS — E109 Type 1 diabetes mellitus without complications: Secondary | ICD-10-CM | POA: Diagnosis not present

## 2016-09-18 DIAGNOSIS — E139 Other specified diabetes mellitus without complications: Secondary | ICD-10-CM

## 2016-09-18 MED ORDER — INSULIN GLARGINE 300 UNIT/ML ~~LOC~~ SOPN: 25.0000 [IU] | PEN_INJECTOR | Freq: Every day | SUBCUTANEOUS | 11 refills | Status: DC

## 2016-09-18 NOTE — Progress Notes (Signed)
Subjective:    Patient ID: Steven [name], male    DOB: 01/12/1946, 71 y.o.   MRN: [id number]  HPI pt is referred by Dr Ronnald Ramp, for diabetes.  Pt states DM was dx'ed in 1977; he has moderate neuropathy of the lower extremities; he has associated CAD, renal insuff, and PAD; he has been on insulin since 2014; pt says his diet and exercise are good; he has never had pancreatitis, severe hypoglycemia or DKA.  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  toujeo was reduced from 50/d to 25/d, due to nocturnal hypoglycemia.  Since the reduction, cbg's vary from 100-150.  It is in general higher as the day goes on.   Past Medical History:  Diagnosis Date  . Allergy   . Arthritis    Right Hip  . Atrial fibrillation ([place])   . COPD (chronic obstructive pulmonary disease) ([place])   . Coronary artery disease    Lexiscan Myoview 7/14:  Inf scar with minimal reversibility, inf HK, EF 45%;  Low risk.  Promus LAD stent placed in [place].  100% RCA  . Diabetes mellitus without complication ([place])   . Dyslipidemia   . GERD (gastroesophageal reflux disease)    has used Zantac in the past   . Hernia   . Hyperlipidemia   . Hypertension   . Peripheral vascular disease ([place])    a. s/p R fem-pop BPG 01/2013 (Dr. [name])  . 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 ABDOMINAL AORTAGRAM N/A 12/10/2012   Procedure: ABDOMINAL Maxcine Ham;  Surgeon: Serafina Mitchell, MD;  Location: [facility] CATH LAB;  Service: Cardiovascular;  Laterality: N/A;  . CARDIAC CATHETERIZATION     with stent placement  . COLONOSCOPY  March 2014  . FEMORAL-POPLITEAL BYPASS GRAFT Right 02/07/2013   Procedure: BYPASS GRAFT FEMORAL-POPLITEAL ARTERY- RIGHT;  Surgeon: Mal Misty, MD;  Location: [facility] OR;  Service: Vascular;  Laterality: Right;  Right femoral to below knee popliteal bypass using right non reversed great saphenous vein  . INGUINAL HERNIA REPAIR  2010   Right  . INTRAOPERATIVE  ARTERIOGRAM Right 02/07/2013   Procedure: Attempted INTRA OPERATIVE ARTERIOGRAM;  Surgeon: Mal Misty, MD;  Location: [facility] OR;  Service: Vascular;  Laterality: Right;    Social History   Social History  . Marital status: Married    Spouse name: N/A  . Number of children: 2  . Years of education: N/A   Occupational History  . retired    Social History Main Topics  . Smoking status: Former Smoker    Types: Cigarettes    Quit date: 02/04/2011  . Smokeless tobacco: Never Used     Comment: Quit tobacco 2 years ago.    . Alcohol use 1.8 oz/week    3 Cans of beer per week     Comment: everyday, beer 40 ozs & alcohol- 1-2 shots   . Drug use: No  . Sexual activity: Yes   Other Topics Concern  . Not on file   Social History Narrative   Five children living.  Lives with wife.      Current Outpatient Prescriptions on File Prior to Visit  Medication Sig Dispense Refill  . aspirin EC 81 MG tablet Take 81 mg by mouth daily.    Marland Kitchen ezetimibe (ZETIA) 10 MG tablet Take 1 tablet (10 mg total) by mouth daily. 90 tablet 3  . Insulin Pen Needle (BD  PEN NEEDLE NANO U/F) 32G X 4 MM MISC use as directed with INSULIN 100 each 4  . losartan (COZAAR) 100 MG tablet TAKE 1 TABLET(100 MG) BY MOUTH DAILY BEFORE BREAKFAST 90 tablet 3  . metFORMIN (GLUCOPHAGE) 1000 MG tablet TAKE 1 TABLET(1000 MG) BY MOUTH TWICE DAILY WITH A MEAL 180 tablet 0  . metoprolol succinate (TOPROL-XL) 50 MG 24 hr tablet TAKE 1 TABLET BY MOUTH EVERY DAY WITH OR IMMEDIATELY FOLLOWING A MEAL 30 tablet 0  . mometasone (NASONEX) 50 MCG/ACT nasal spray Place 2 sprays into the nose daily. 17 g 3  . tamsulosin (FLOMAX) 0.4 MG CAPS capsule TAKE 1 CAPSULE(0.4 MG) BY MOUTH DAILY AFTER DINNER 90 capsule 3  . traMADol (ULTRAM) 50 MG tablet Take 1 tablet (50 mg total) by mouth every 8 (eight) hours as needed. 65 tablet 2  . nitroGLYCERIN (NITROSTAT) 0.4 MG SL tablet Place 1 tablet (0.4 mg total) under the tongue every 5 (five) minutes as needed  for chest pain. (Patient not taking: Reported on 06/14/2016) 25 tablet 0  . rosuvastatin (CRESTOR) 40 MG tablet Take 1 tablet (40 mg total) by mouth daily. 30 tablet 11   No current facility-administered medications on file prior to visit.     No Known Allergies  Family History  Problem Relation Age of Onset  . Diabetes Mother   . Hypertension Mother   . Heart disease Mother   . Hyperlipidemia Mother   . Diabetes Father   . Heart disease Father   . Hyperlipidemia Father   . Hypertension Father   . Diabetes Sister   . Heart disease Sister   . Hyperlipidemia Sister   . Hypertension Sister   . Diabetes Brother   . Heart disease Brother   . Hyperlipidemia Brother   . Hypertension Brother   . Diabetes Son   . Heart disease Son   . Hyperlipidemia Son   . Hypertension Son   . Alcohol abuse Neg Hx   . Cancer Neg Hx   . Depression Neg Hx   . Early death Neg Hx   . Kidney disease Neg Hx   . Stroke Neg Hx   . Colon cancer Neg Hx   . Esophageal cancer Neg Hx   . Rectal cancer Neg Hx   . Stomach cancer Neg Hx     BP 132/70   Pulse 83   Ht 5\' 5"  (1.651 m)   Wt 146 lb (66.2 kg)   SpO2 97%   BMI 24.30 kg/m     Review of Systems denies weight loss, blurry vision, headache, chest pain, sob, n/v, urinary frequency, depression, cold intolerance, and easy bruising.  He has intermittent leg cramps, rhinorrhea, and night sweats.      Objective:   Physical Exam VS: see vs page GEN: no distress HEAD: head: no deformity eyes: no periorbital swelling, no proptosis external nose and ears are normal mouth: no lesion seen NECK: supple, thyroid is not enlarged CHEST WALL: no deformity LUNGS: clear to auscultation CV: reg rate and rhythm, no murmur ABD: abdomen is soft, nontender.  no hepatosplenomegaly.  not distended.  no hernia MUSCULOSKELETAL: muscle bulk and strength are grossly normal.  no obvious joint swelling.  gait is normal and steady EXTEMITIES: no deformity.  no  ulcer on the feet.  feet are of normal color and temp.  no edema PULSES: dorsalis pedis intact bilat.  no carotid bruit NEURO:  cn 2-12 grossly intact.   readily moves all 4's.  sensation is intact to touch on the feet, but decreased from normal.  SKIN:  Normal texture and temperature.  No rash or suspicious lesion is visible.   NODES:  None palpable at the neck PSYCH: alert, well-oriented.  Does not appear anxious nor depressed.   Lab Results  Component Value Date   CREATININE 1.79 (H) 08/22/2016   BUN 28 (H) 08/22/2016   NA 138 08/22/2016   K 4.0 08/22/2016   CL 102 08/22/2016   CO2 29 08/22/2016    Lab Results  Component Value Date   HGBA1C 8.2 (H) 08/22/2016   I personally reviewed electrocardiogram tracing (03/16/16): Indication: CAD Impression: SB.  No MI.  Voltage suggests hypertrophy. Compared to 2016: SB is new    Assessment & Plan:  Insulin-requiring type 2 DM, with renal insuff, new to me: he may be evolving type 1.  Check fructosamine.   Patient is advised the following: Patient Instructions  good diet and exercise significantly improve the control of your diabetes.  please let me know if you wish to be referred to a dietician.  high blood sugar is very risky to your health.  you should see an eye doctor and dentist every year.  It is very important to get all recommended vaccinations.  Controlling your blood pressure and cholesterol drastically reduces the damage diabetes does to your body.  Those who smoke should quit.  Please discuss these with your doctor.  check your blood sugar twice a day.  vary the time of day when you check, between before the 3 meals, and at bedtime.  also check if you have symptoms of your blood sugar being too high or too low.  please keep a record of the readings and bring it to your next appointment here (or you can bring the meter itself).  You can write it on any piece of paper.  please call us sooner if your blood sugar goes below 70, or  if you have a lot of readings over 200. We will need to take this complex situation in stages. blood tests are requested for you today.  We'll let you know about the results.   Please continue the same insulin for now.  Please come back for a follow-up appointment in 2 weeks.

## 2016-09-18 NOTE — Patient Instructions (Addendum)
good diet and exercise significantly improve the control of your diabetes.  please let me know if you wish to be referred to a dietician.  high blood sugar is very risky to your health.  you should see an eye doctor and dentist every year.  It is very important to get all recommended vaccinations.  Controlling your blood pressure and cholesterol drastically reduces the damage diabetes does to your body.  Those who smoke should quit.  Please discuss these with your doctor.  check your blood sugar twice a day.  vary the time of day when you check, between before the 3 meals, and at bedtime.  also check if you have symptoms of your blood sugar being too high or too low.  please keep a record of the readings and bring it to your next appointment here (or you can bring the meter itself).  You can write it on any piece of paper.  please call us sooner if your blood sugar goes below 70, or if you have a lot of readings over 200. We will need to take this complex situation in stages. blood tests are requested for you today.  We'll let you know about the results.   Please continue the same insulin for now.  Please come back for a follow-up appointment in 2 weeks.

## 2016-09-22 ENCOUNTER — Encounter: Payer: Self-pay | Admitting: Vascular Surgery

## 2016-09-28 ENCOUNTER — Encounter: Payer: Self-pay | Admitting: Internal Medicine

## 2016-09-28 ENCOUNTER — Ambulatory Visit (INDEPENDENT_AMBULATORY_CARE_PROVIDER_SITE_OTHER): Payer: Medicare Other | Admitting: Internal Medicine

## 2016-09-28 ENCOUNTER — Telehealth: Payer: Self-pay | Admitting: Internal Medicine

## 2016-09-28 ENCOUNTER — Telehealth: Payer: Self-pay | Admitting: Medical

## 2016-09-28 VITALS — BP 130/78 | HR 90 | Temp 98.0°F | Ht 65.0 in | Wt 148.0 lb

## 2016-09-28 DIAGNOSIS — J309 Allergic rhinitis, unspecified: Secondary | ICD-10-CM | POA: Diagnosis not present

## 2016-09-28 DIAGNOSIS — J449 Chronic obstructive pulmonary disease, unspecified: Secondary | ICD-10-CM | POA: Diagnosis not present

## 2016-09-28 DIAGNOSIS — I10 Essential (primary) hypertension: Secondary | ICD-10-CM

## 2016-09-28 DIAGNOSIS — I70219 Atherosclerosis of native arteries of extremities with intermittent claudication, unspecified extremity: Secondary | ICD-10-CM

## 2016-09-28 MED ORDER — TRIAMCINOLONE ACETONIDE 55 MCG/ACT NA AERO: 2.0000 | INHALATION_SPRAY | Freq: Every day | NASAL | 12 refills | Status: DC

## 2016-09-28 MED ORDER — PREDNISONE 10 MG PO TABS: ORAL_TABLET | ORAL | 0 refills | Status: DC

## 2016-09-28 MED ORDER — HYDROCODONE-HOMATROPINE 5-1.5 MG/5ML PO SYRP: 5.0000 mL | ORAL_SOLUTION | Freq: Four times a day (QID) | ORAL | 0 refills | Status: AC | PRN

## 2016-09-28 MED ORDER — METHYLPREDNISOLONE ACETATE 80 MG/ML IJ SUSP
80.0000 mg | Freq: Once | INTRAMUSCULAR | Status: AC
  Administered 2016-09-28: 80 mg via INTRAMUSCULAR

## 2016-09-28 MED ORDER — CETIRIZINE HCL 10 MG PO TABS: 10.0000 mg | ORAL_TABLET | Freq: Every day | ORAL | 11 refills | Status: DC

## 2016-09-28 NOTE — Assessment & Plan Note (Signed)
Mild to mod, with seasonal flare, for depomedrol IM 80, predpac asd, zyrtec and nasacort asd, cough med for severe night time cough,  to f/u any worsening symptoms or concerns

## 2016-09-28 NOTE — Patient Instructions (Signed)
.  You had the steroid shot today  Please take all new medication as prescribed - the zyrtec, nasacort, cough med and prednisone  OK to stop the nasonex  Please continue all other medications as before, including the Aspirin  Please have the pharmacy call with any other refills you may need.  Please keep your appointments with your specialists as you may have planned

## 2016-09-28 NOTE — Telephone Encounter (Signed)
Yes, ok with me 

## 2016-09-28 NOTE — Telephone Encounter (Signed)
Patient request to transfer care from Dr. Ronnald Ramp to Mackie Pai

## 2016-09-28 NOTE — Progress Notes (Signed)
Subjective:    Patient ID: Steven [name], male    DOB: 28-May-1946, 71 y.o.   MRN: [id number]  HPI  Here to f/u, Does have several wks ongoing nasal and bilat eye allergy symptoms with clearish congestion, itch and sneezing, without fever, pain, ST, cough, swelling or wheezing.  Pt denies chest pain, increased sob or doe, wheezing, orthopnea, PND, increased LE swelling, palpitations, dizziness or syncope.  Pt denies polydipsia, polyuria  Past Medical History:  Diagnosis Date  . Allergy   . Arthritis    Right Hip  . Atrial fibrillation ([place])   . COPD (chronic obstructive pulmonary disease) ([place])   . Coronary artery disease    Lexiscan Myoview 7/14:  Inf scar with minimal reversibility, inf HK, EF 45%;  Low risk.  Promus LAD stent placed in [place].  100% RCA  . Diabetes mellitus without complication ([place])   . Dyslipidemia   . GERD (gastroesophageal reflux disease)    has used Zantac in the past   . Hernia   . Hyperlipidemia   . Hypertension   . Peripheral vascular disease ([place])    a. s/p R fem-pop BPG 01/2013 (Dr. [name])  . 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 ABDOMINAL AORTAGRAM N/A 12/10/2012   Procedure: ABDOMINAL Maxcine Ham;  Surgeon: Serafina Mitchell, MD;  Location: [facility] CATH LAB;  Service: Cardiovascular;  Laterality: N/A;  . CARDIAC CATHETERIZATION     with stent placement  . COLONOSCOPY  March 2014  . FEMORAL-POPLITEAL BYPASS GRAFT Right 02/07/2013   Procedure: BYPASS GRAFT FEMORAL-POPLITEAL ARTERY- RIGHT;  Surgeon: Mal Misty, MD;  Location: [facility] OR;  Service: Vascular;  Laterality: Right;  Right femoral to below knee popliteal bypass using right non reversed great saphenous vein  . INGUINAL HERNIA REPAIR  2010   Right  . INTRAOPERATIVE ARTERIOGRAM Right 02/07/2013   Procedure: Attempted INTRA OPERATIVE ARTERIOGRAM;  Surgeon: Mal Misty, MD;  Location: [facility];  Service: Vascular;  Laterality: Right;    reports  that he quit smoking about 5 years ago. His smoking use included Cigarettes. He has never used smokeless tobacco. He reports that he drinks about 1.8 oz of alcohol per week . He reports that he does not use drugs. family history includes Diabetes in his brother, father, mother, sister, and son; Heart disease in his brother, father, mother, sister, and son; Hyperlipidemia in his brother, father, mother, sister, and son; Hypertension in his brother, father, mother, sister, and son. No Known Allergies Current Outpatient Prescriptions on File Prior to Visit  Medication Sig Dispense Refill  . aspirin EC 81 MG tablet Take 81 mg by mouth daily.    Marland Kitchen ezetimibe (ZETIA) 10 MG tablet Take 1 tablet (10 mg total) by mouth daily. 90 tablet 3  . Insulin Glargine (TOUJEO SOLOSTAR) 300 UNIT/ML SOPN Inject 25 Units into the skin daily. 1.5 mL 11  . Insulin Pen Needle (BD PEN NEEDLE NANO U/F) 32G X 4 MM MISC use as directed with INSULIN 100 each 4  . losartan (COZAAR) 100 MG tablet TAKE 1 TABLET(100 MG) BY MOUTH DAILY BEFORE BREAKFAST 90 tablet 3  . metFORMIN (GLUCOPHAGE) 1000 MG tablet TAKE 1 TABLET(1000 MG) BY MOUTH TWICE DAILY WITH A MEAL 180 tablet 0  . metoprolol succinate (TOPROL-XL) 50 MG 24 hr tablet TAKE 1 TABLET BY MOUTH EVERY DAY WITH OR IMMEDIATELY FOLLOWING A MEAL 30 tablet 0  . mometasone (NASONEX) 50  MCG/ACT nasal spray Place 2 sprays into the nose daily. 17 g 3  . nitroGLYCERIN (NITROSTAT) 0.4 MG SL tablet Place 1 tablet (0.4 mg total) under the tongue every 5 (five) minutes as needed for chest pain. (Patient not taking: Reported on 06/14/2016) 25 tablet 0  . rosuvastatin (CRESTOR) 40 MG tablet Take 1 tablet (40 mg total) by mouth daily. 30 tablet 11  . tamsulosin (FLOMAX) 0.4 MG CAPS capsule TAKE 1 CAPSULE(0.4 MG) BY MOUTH DAILY AFTER DINNER 90 capsule 3  . traMADol (ULTRAM) 50 MG tablet Take 1 tablet (50 mg total) by mouth every 8 (eight) hours as needed. 65 tablet 2   No current  facility-administered medications on file prior to visit.    Review of Systems All otherwise neg per pt     Objective:   Physical Exam BP 130/78 (BP Location: Left Arm, Patient Position: Sitting, Cuff Size: Normal)   Pulse 90   Temp 98 F (36.7 C) (Oral)   Ht 5\' 5"  (1.651 m)   Wt 148 lb (67.1 kg)   SpO2 100%   BMI 24.63 kg/m  VS noted, not ill appearing Constitutional: Pt appears in no apparent distress HENT: Head: NCAT.  Right Ear: External ear normal.  Left Ear: External ear normal.  Eyes: . Pupils are equal, round, and reactive to light. Conjunctivae with bilat conjunct erythema and puffy swelling bilat upper and lower eyelids, and EOM are normal Bilat tm's with mild erythema.  Max sinus areas non tender.  Pharynx with mild erythema, no exudate Neck: Normal range of motion. Neck supple. No neck LA or tenderness Cardiovascular: Normal rate and regular rhythm.   Pulmonary/Chest: Effort normal and breath sounds without rales or wheezing.  Neurological: Pt is alert. Not confused , motor grossly intact Skin: Skin is warm. No rash, no LE edema Psychiatric: Pt behavior is normal. No agitation.  No other exam findings    Assessment & Plan:

## 2016-09-28 NOTE — Assessment & Plan Note (Signed)
stable overall by history and exam, recent data reviewed with pt, and pt to continue medical treatment as before,  to f/u any worsening symptoms or concerns @LASTSAO2(3)@  

## 2016-09-28 NOTE — Assessment & Plan Note (Signed)
stable overall by history and exam, recent data reviewed with pt, and pt to continue medical treatment as before,  to f/u any worsening symptoms or concerns BP Readings from Last 3 Encounters:  09/28/16 130/78  09/18/16 132/70  09/06/16 (!) 161/78

## 2016-09-28 NOTE — Progress Notes (Signed)
Pre visit review using our clinic review tool, if applicable. No additional management support is needed unless otherwise documented below in the visit note. 

## 2016-09-28 NOTE — Telephone Encounter (Signed)
Ok. Is he willing to come in for new pt visit. Will you schedule him 30 minute appointment early am or early afternoon.

## 2016-09-29 ENCOUNTER — Ambulatory Visit (INDEPENDENT_AMBULATORY_CARE_PROVIDER_SITE_OTHER): Payer: Medicare Other | Admitting: Vascular Surgery

## 2016-09-29 VITALS — BP 137/74 | HR 78 | Temp 98.2°F | Resp 16 | Ht 65.0 in | Wt 146.0 lb

## 2016-09-29 DIAGNOSIS — I70212 Atherosclerosis of native arteries of extremities with intermittent claudication, left leg: Secondary | ICD-10-CM

## 2016-09-29 NOTE — Progress Notes (Signed)
Patient ID: Steven [name], male   DOB: October 29, 1945, 71 y.o.   MRN: [id number]  Reason for Consult: PVD (pt c/o cramping in left calf )   Referred by Janith Lima, MD  Subjective:     HPI:  Steven [name] is a 71 y.o. male from [place] who has a previous history of a right femoral-to-popliteal artery bypass with vein. That was done for severe claudication and now he has similar symptoms on the left. He also complains of left lower extremity rest pain occasionally. His claudication is very short distance with cramping in his calf and his life limiting as he works as a Runner, broadcasting/film/video. He does not have tissue loss or ulceration. He has never had stroke TIA or amaurosis. He is doing well from right lower extremity standpoint. He takes aspirin and statin daily.  Past Medical History:  Diagnosis Date  . Allergy   . Arthritis    Right Hip  . Atrial fibrillation ([place])   . COPD (chronic obstructive pulmonary disease) ([place])   . Coronary artery disease    Lexiscan Myoview 7/14:  Inf scar with minimal reversibility, inf HK, EF 45%;  Low risk.  Promus LAD stent placed in [place].  100% RCA  . Diabetes mellitus without complication ([place])   . Dyslipidemia   . GERD (gastroesophageal reflux disease)    has used Zantac in the past   . Hernia   . Hyperlipidemia   . Hypertension   . Peripheral vascular disease ([place])    a. s/p R fem-pop BPG 01/2013 (Dr. [name])  . 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 Diabetes Mother   . Hypertension Mother   . Heart disease Mother   . Hyperlipidemia Mother   . Diabetes Father   . Heart disease Father   . Hyperlipidemia Father   . Hypertension Father   . Diabetes Sister   . Heart disease Sister   . Hyperlipidemia Sister   . Hypertension Sister   . Diabetes Brother   . Heart disease Brother   . Hyperlipidemia Brother   . Hypertension Brother   . Diabetes Son   . Heart disease Son     . Hyperlipidemia Son   . Hypertension Son   . Alcohol abuse Neg Hx   . Cancer Neg Hx   . Depression Neg Hx   . Early death Neg Hx   . Kidney disease Neg Hx   . Stroke Neg Hx   . Colon cancer Neg Hx   . Esophageal cancer Neg Hx   . Rectal cancer Neg Hx   . Stomach cancer Neg Hx    Past Surgical History:  Procedure Laterality Date  . ABDOMINAL AORTAGRAM N/A 12/10/2012   Procedure: ABDOMINAL Maxcine Ham;  Surgeon: Serafina Mitchell, MD;  Location: [facility] CATH LAB;  Service: Cardiovascular;  Laterality: N/A;  . CARDIAC CATHETERIZATION     with stent placement  . COLONOSCOPY  March 2014  . FEMORAL-POPLITEAL BYPASS GRAFT Right 02/07/2013   Procedure: BYPASS GRAFT FEMORAL-POPLITEAL ARTERY- RIGHT;  Surgeon: Mal Misty, MD;  [facility];  Service: Vascular;  Laterality: Right;  Right femoral to below knee popliteal bypass using right non reversed great saphenous vein  . INGUINAL HERNIA REPAIR  2010   Right  . INTRAOPERATIVE ARTERIOGRAM Right 02/07/2013   Procedure: Attempted INTRA OPERATIVE ARTERIOGRAM;  Surgeon: Mal Misty, MD;  [facility]  OR;  Service: Vascular;  Laterality: Right;    Short Social History:  Social History  Substance Use Topics  . Smoking status: Former Smoker    Types: Cigarettes    Quit date: 02/04/2011  . Smokeless tobacco: Never Used     Comment: Quit tobacco 2 years ago.    . Alcohol use 1.8 oz/week    3 Cans of beer per week     Comment: everyday, beer 40 ozs & alcohol- 1-2 shots     No Known Allergies  Current Outpatient Prescriptions  Medication Sig Dispense Refill  . aspirin EC 81 MG tablet Take 81 mg by mouth daily.    . cetirizine (ZYRTEC) 10 MG tablet Take 1 tablet (10 mg total) by mouth daily. 30 tablet 11  . ezetimibe (ZETIA) 10 MG tablet Take 1 tablet (10 mg total) by mouth daily. 90 tablet 3  . HYDROcodone-homatropine (HYCODAN) 5-1.5 MG/5ML syrup Take 5 mLs by mouth every 6 (six) hours as needed for cough. 120 mL 0  . Insulin Glargine  (TOUJEO SOLOSTAR) 300 UNIT/ML SOPN Inject 25 Units into the skin daily. 1.5 mL 11  . Insulin Pen Needle (BD PEN NEEDLE NANO U/F) 32G X 4 MM MISC use as directed with INSULIN 100 each 4  . losartan (COZAAR) 100 MG tablet TAKE 1 TABLET(100 MG) BY MOUTH DAILY BEFORE BREAKFAST 90 tablet 3  . metFORMIN (GLUCOPHAGE) 1000 MG tablet TAKE 1 TABLET(1000 MG) BY MOUTH TWICE DAILY WITH A MEAL 180 tablet 0  . metoprolol succinate (TOPROL-XL) 50 MG 24 hr tablet TAKE 1 TABLET BY MOUTH EVERY DAY WITH OR IMMEDIATELY FOLLOWING A MEAL 30 tablet 0  . mometasone (NASONEX) 50 MCG/ACT nasal spray Place 2 sprays into the nose daily. 17 g 3  . nitroGLYCERIN (NITROSTAT) 0.4 MG SL tablet Place 1 tablet (0.4 mg total) under the tongue every 5 (five) minutes as needed for chest pain. (Patient not taking: Reported on 06/14/2016) 25 tablet 0  . predniSONE (DELTASONE) 10 MG tablet 2 tabs by mouth per day for 7 days 14 tablet 0  . rosuvastatin (CRESTOR) 40 MG tablet Take 1 tablet (40 mg total) by mouth daily. 30 tablet 11  . tamsulosin (FLOMAX) 0.4 MG CAPS capsule TAKE 1 CAPSULE(0.4 MG) BY MOUTH DAILY AFTER DINNER 90 capsule 3  . traMADol (ULTRAM) 50 MG tablet Take 1 tablet (50 mg total) by mouth every 8 (eight) hours as needed. 65 tablet 2  . triamcinolone (NASACORT AQ) 55 MCG/ACT AERO nasal inhaler Place 2 sprays into the nose daily. 1 Inhaler 12   No current facility-administered medications for this visit.     Review of Systems  Constitutional:  Constitutional negative. Eyes: Eyes negative.  Respiratory: Respiratory negative.  Cardiovascular: Positive for claudication.  GI: Gastrointestinal negative.  Musculoskeletal: Positive for leg pain.  Skin: Skin negative.  Neurological: Neurological negative. Hematologic: Hematologic/lymphatic negative.        Objective:  Objective   Vitals:   09/29/16 1619  BP: 137/74  Pulse: 78  Resp: 16  Temp: 98.2 F (36.8 C)  TempSrc: Oral  SpO2: 97%  Weight: 146 lb (66.2 kg)    Height: 5\' 5"  (1.651 m)   Body mass index is 24.3 kg/m.  Physical Exam  Constitutional: He is oriented to person, place, and time. He appears well-developed.  HENT:  Head: Normocephalic.  Neck: Normal range of motion.  Cardiovascular:  Pulses:      Radial pulses are 2+ on the right side, and 2+ on  the left side.       Femoral pulses are 2+ on the right side, and 2+ on the left side.      Dorsalis pedis pulses are 2+ on the right side, and 0 on the left side.       Posterior tibial pulses are 2+ on the right side, and 0 on the left side.  Pulmonary/Chest: Effort normal.  Abdominal: Soft. He exhibits no mass.  Musculoskeletal: Normal range of motion. He exhibits no edema.  Neurological: He is alert and oriented to person, place, and time.  Skin: Skin is warm and dry.  Psychiatric: He has a normal mood and affect. His behavior is normal. Judgment and thought content normal.    Data: ABI on the right 0.85. Left 0.4.     Assessment/Plan:     71 year old male with left lower extremity rest pain. His previous right lower [place] bypasses done well from this standpoint. Maintain on aspirin and statin. We will perform aortogram left lower extremity runoff. We will use CO2 is his creatinine is elevated. We discussed possible intervention. If there is no endovascular options we'll consider open repair and will need cardiac clearance prior.     Waynetta Sandy MD [facility]

## 2016-09-29 NOTE — Telephone Encounter (Signed)
Left message on VM for patient to call back and schedule New Patient appointment with Mackie Pai.

## 2016-10-05 ENCOUNTER — Telehealth: Payer: Self-pay | Admitting: *Deleted

## 2016-10-05 NOTE — Telephone Encounter (Signed)
Steven [name] called to cancel his aortogram w/ LLE runoff that we had scheduled for 10-10-16 with Dr. Donzetta Matters. He states that his wife is having toe surgery and he will need to help her recover. He is to call back asap and reschedule this aortogram; I will place his surgery scheduling form in Dr. Claretha Cooper Will Call file so that we can track this.

## 2016-10-09 ENCOUNTER — Ambulatory Visit: Payer: Self-pay | Admitting: Endocrinology

## 2016-10-09 ENCOUNTER — Telehealth: Payer: Self-pay | Admitting: Endocrinology

## 2016-10-09 MED ORDER — ONETOUCH DELICA LANCETS 33G MISC: 2 refills | Status: DC

## 2016-10-09 MED ORDER — GLUCOSE BLOOD VI STRP: ORAL_STRIP | 4 refills | Status: DC

## 2016-10-09 MED ORDER — ONETOUCH VERIO IQ SYSTEM W/DEVICE KIT: PACK | 2 refills | Status: DC

## 2016-10-09 NOTE — Telephone Encounter (Signed)
I contacted the patient and advised of message. Requested a call back if the patient would like for another meter to be submitted.

## 2016-10-09 NOTE — Telephone Encounter (Signed)
I would be happy to send a prescription to a pharmacy, for a new meter and strips.

## 2016-10-09 NOTE — Telephone Encounter (Signed)
Patient called and want another meter.

## 2016-10-09 NOTE — Telephone Encounter (Signed)
See message to be advised, Thanks! 

## 2016-10-09 NOTE — Telephone Encounter (Signed)
Rx submitted for new meter, strips and lancets.

## 2016-10-09 NOTE — Telephone Encounter (Signed)
patient stated that his meter didn't come, so he had to cancel his appt for today.

## 2016-10-10 ENCOUNTER — Encounter (HOSPITAL_COMMUNITY): Payer: Self-pay

## 2016-10-10 ENCOUNTER — Ambulatory Visit (HOSPITAL_COMMUNITY): Admit: 2016-10-10 | Payer: Medicare Other | Admitting: Vascular Surgery

## 2016-10-10 SURGERY — ABDOMINAL AORTOGRAM W/LOWER EXTREMITY

## 2016-11-08 ENCOUNTER — Telehealth: Payer: Self-pay | Admitting: Behavioral Health

## 2016-11-08 ENCOUNTER — Encounter: Payer: Self-pay | Admitting: Behavioral Health

## 2016-11-08 NOTE — Telephone Encounter (Signed)
Pre-Visit Call completed with patient and chart updated.   Pre-Visit Info documented in Specialty Comments under Snap[employer].    

## 2016-11-09 ENCOUNTER — Ambulatory Visit (INDEPENDENT_AMBULATORY_CARE_PROVIDER_SITE_OTHER): Payer: Medicare Other | Admitting: Family Medicine

## 2016-11-09 ENCOUNTER — Encounter: Payer: Self-pay | Admitting: Family Medicine

## 2016-11-09 VITALS — BP 164/64 | HR 87 | Temp 98.0°F | Ht 65.0 in | Wt 147.4 lb

## 2016-11-09 DIAGNOSIS — I10 Essential (primary) hypertension: Secondary | ICD-10-CM

## 2016-11-09 DIAGNOSIS — H11003 Unspecified pterygium of eye, bilateral: Secondary | ICD-10-CM

## 2016-11-09 HISTORY — DX: Unspecified pterygium of eye, bilateral: H11.003

## 2016-11-09 NOTE — Progress Notes (Signed)
Chief Complaint  Patient presents with  . Establish Care       New Patient Visit SUBJECTIVE: HPI: Steven [name] is an 71 y.o.male who is being seen for establishing care.  The patient was previously seen at [facility] but moved to [place] and this location is more convenient.  Hypertension Patient presents for hypertension follow up. He does not monitor home blood pressures. He is compliant with medications- Metoprolol 50 mg XL daily, Losartan 100 mg daily. Patient has these side effects of medication: none He is adhering to a healthy diet overall. Exercise: some walking, active at work Tax [employer])   No Known Allergies  Past Medical History:  Diagnosis Date  . Allergy   . Arthritis    Right Hip  . Atrial fibrillation ([place])   . COPD (chronic obstructive pulmonary disease) ([place])   . Coronary artery disease    Lexiscan Myoview 7/14:  Inf scar with minimal reversibility, inf HK, EF 45%;  Low risk.  Promus LAD stent placed in [place].  100% RCA  . Diabetes mellitus without complication ([place])   . Dyslipidemia   . GERD (gastroesophageal reflux disease)    has used Zantac in the past   . Hernia   . Hyperlipidemia   . Hypertension   . Peripheral vascular disease ([place])    a. s/p R fem-pop BPG 01/2013 (Dr. [name])  . 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 ABDOMINAL AORTAGRAM N/A 12/10/2012   Procedure: ABDOMINAL Maxcine Ham;  Surgeon: Serafina Mitchell, MD;  Location: [facility] CATH LAB;  Service: Cardiovascular;  Laterality: N/A;  . CARDIAC CATHETERIZATION     with stent placement  . COLONOSCOPY  March 2014  . FEMORAL-POPLITEAL BYPASS GRAFT Right 02/07/2013   Procedure: BYPASS GRAFT FEMORAL-POPLITEAL ARTERY- RIGHT;  Surgeon: Mal Misty, MD;  Location: [facility] OR;  Service: Vascular;  Laterality: Right;  Right femoral to below knee popliteal bypass using right non reversed great saphenous vein  .  INGUINAL HERNIA REPAIR  2010   Right  . INTRAOPERATIVE ARTERIOGRAM Right 02/07/2013   Procedure: Attempted INTRA OPERATIVE ARTERIOGRAM;  Surgeon: Mal Misty, MD;  Location: [facility] OR;  Service: Vascular;  Laterality: Right;   Social History   Social History  . Marital status: Married   Occupational History  . retired    Social History Main Topics  . Smoking status: Former Smoker    Types: Cigarettes    Quit date: 02/04/2011  . Smokeless tobacco: Never Used     Comment: Quit tobacco 2 years ago.    . Alcohol use 1.8 oz/week    3 Cans of beer per week     Comment: everyday, beer 40 ozs & alcohol- 1-2 shots   . Drug use: No  . Sexual activity: Yes   Social History Narrative   Five children living.  Lives with wife.     Family History  Problem Relation Age of Onset  . Diabetes Mother   . Hypertension Mother   . Heart disease Mother   . Hyperlipidemia Mother   . Diabetes Father   . Heart disease Father   . Hyperlipidemia Father   . Hypertension Father   . Diabetes Sister   . Heart disease Sister   . Hyperlipidemia Sister   . Hypertension Sister   . Diabetes Brother   . Heart disease Brother   . Hyperlipidemia Brother   .  Hypertension Brother   . Diabetes Son   . Heart disease Son   . Hyperlipidemia Son   . Hypertension Son   . Alcohol abuse Neg Hx   . Cancer Neg Hx   . Depression Neg Hx   . Early death Neg Hx   . Kidney disease Neg Hx   . Stroke Neg Hx   . Colon cancer Neg Hx   . Esophageal cancer Neg Hx   . Rectal cancer Neg Hx   . Stomach cancer Neg Hx      Current Outpatient Prescriptions:  .  aspirin EC 81 MG tablet, Take 81 mg by mouth daily., Disp: , Rfl:  .  Blood Glucose Monitoring Suppl (ONETOUCH VERIO IQ SYSTEM) w/Device KIT, Use to check blood sugar 2 times per day., Disp: 1 kit, Rfl: 2 .  ezetimibe (ZETIA) 10 MG tablet, Take 1 tablet (10 mg total) by mouth daily., Disp: 90 tablet, Rfl: 3 .  glucose blood (ONETOUCH VERIO) test strip, Use to check  blood sugar 2 times per day, Disp: 200 each, Rfl: 4 .  Insulin Glargine (TOUJEO SOLOSTAR) 300 UNIT/ML SOPN, Inject 25 Units into the skin daily., Disp: 1.5 mL, Rfl: 11 .  Insulin Pen Needle (BD PEN NEEDLE NANO U/F) 32G X 4 MM MISC, use as directed with INSULIN, Disp: 100 each, Rfl: 4 .  losartan (COZAAR) 100 MG tablet, TAKE 1 TABLET(100 MG) BY MOUTH DAILY BEFORE BREAKFAST, Disp: 90 tablet, Rfl: 3 .  metFORMIN (GLUCOPHAGE) 1000 MG tablet, TAKE 1 TABLET(1000 MG) BY MOUTH TWICE DAILY WITH A MEAL, Disp: 180 tablet, Rfl: 0 .  metoprolol succinate (TOPROL-XL) 50 MG 24 hr tablet, TAKE 1 TABLET BY MOUTH EVERY DAY WITH OR IMMEDIATELY FOLLOWING A MEAL, Disp: 30 tablet, Rfl: 0 .  mometasone (NASONEX) 50 MCG/ACT nasal spray, Place 2 sprays into the nose daily., Disp: 17 g, Rfl: 3 .  ONETOUCH DELICA LANCETS 35K MISC, Use to check blood sugar 2 times per day., Disp: 200 each, Rfl: 2 .  traMADol (ULTRAM) 50 MG tablet, Take 1 tablet (50 mg total) by mouth every 8 (eight) hours as needed., Disp: 65 tablet, Rfl: 2 .  cetirizine (ZYRTEC) 10 MG tablet, Take 1 tablet (10 mg total) by mouth daily. (Patient not taking: Reported on 11/08/2016), Disp: 30 tablet, Rfl: 11 .  rosuvastatin (CRESTOR) 40 MG tablet, Take 1 tablet (40 mg total) by mouth daily., Disp: 30 tablet, Rfl: 11  ROS Cardiovascular: Denies chest pain  Respiratory: Denies dyspnea   OBJECTIVE: BP (!) 164/64 (BP Location: Left Arm, Patient Position: Sitting, Cuff Size: Normal)   Pulse 87   Temp 98 F (36.7 C) (Oral)   Ht '5\' 5"'  (1.651 m)   Wt 147 lb 6.4 oz (66.9 kg)   SpO2 98%   BMI 24.53 kg/m   Constitutional: -  VS reviewed -  Well developed, well nourished, appears stated age -  No apparent distress  Psychiatric: -  Oriented to person, place, and time -  Memory intact -  Affect and mood normal -  Fluent conversation, good eye contact -  Judgment and insight age appropriate  Eye: -  Medial portion of globe with fan like projection of soft  tissue projecting laterally b/l -  Pupils symmetric, round, reactive to light  Cardiovascular: -  RRR, no murmurs -  No LE edema  Respiratory: -  Normal respiratory effort, no accessory muscle use, no retraction -  Breath sounds equal, no wheezes, no ronchi, no crackles  ASSESSMENT/PLAN: Essential hypertension, benign  Pterygium eye, bilateral  Status: Uncontrolled Needs PCV23 in 1 year. Discussed home BP checks, will schedule in 3 weeks to bring in log and monitor, give time to get monitor. Upper arm cuff preferable. If elevated readings at f/u, would start Norvasc vs changing to Exforge. Asymptomatic pterygiums b/l, will file to hx and refer to ophtho if any changes. The patient voiced understanding and agreement to the plan.   [place], DO 11/09/16  8:52 AM

## 2016-11-09 NOTE — Patient Instructions (Signed)
If you can, get an upper arm blood pressure monitor.  Around 3 times per week, check your blood pressure 4 times per day. Twice in the morning and twice in the evening. The readings should be at least one minute apart. Write down these values and bring them to your next nurse visit/appointment.  When you check your BP, make sure you have been doing something calm/relaxing 5 minutes prior to checking. Both feet should be flat on the floor and you should be sitting. Use your left arm and make sure it is in a relaxed position (on a table), and that the cuff is at the approximate level/height of your heart.

## 2016-11-09 NOTE — Progress Notes (Signed)
Pre visit review using our clinic review tool, if applicable. No additional management support is needed unless otherwise documented below in the visit note. 

## 2016-11-10 ENCOUNTER — Telehealth: Payer: Self-pay | Admitting: Family Medicine

## 2016-11-10 NOTE — Telephone Encounter (Signed)
°  Relation to JR:PZPS Call back number:[phone]   Reason for call:  Patient requesting hard copy prescription for blood pressure cuff, please advise

## 2016-11-15 NOTE — Telephone Encounter (Signed)
Please see above

## 2016-11-15 NOTE — Telephone Encounter (Signed)
Contacted patient please send Rx to   [facility], [place] - [address] AT [address] [phone]

## 2016-11-15 NOTE — Telephone Encounter (Signed)
Paper rx written, does he want it sent somewhere? Sometimes insurances still aren't willing to pay for. TY.

## 2016-11-16 NOTE — Telephone Encounter (Signed)
Called and spoke with the pt and informed him that the pharmacy does not take prescriptions for BP cuffs.  He will need to take the prescription to a medical supply store.  Pt verbalized understanding and agreed.  Pt stated that he will come by the office and pick the prescription up.  Informed the pt that I will leave the prescription up front.  Pt agreed.  Prescription for the BP cuff was placed up front.//AB/CMA

## 2016-12-01 ENCOUNTER — Other Ambulatory Visit: Payer: Self-pay | Admitting: Internal Medicine

## 2016-12-19 ENCOUNTER — Encounter: Payer: Self-pay | Admitting: Podiatry

## 2016-12-19 ENCOUNTER — Ambulatory Visit (INDEPENDENT_AMBULATORY_CARE_PROVIDER_SITE_OTHER): Payer: Medicare Other | Admitting: Podiatry

## 2016-12-19 DIAGNOSIS — E109 Type 1 diabetes mellitus without complications: Secondary | ICD-10-CM | POA: Diagnosis not present

## 2016-12-19 DIAGNOSIS — G629 Polyneuropathy, unspecified: Secondary | ICD-10-CM

## 2016-12-19 DIAGNOSIS — Q828 Other specified congenital malformations of skin: Secondary | ICD-10-CM | POA: Diagnosis not present

## 2016-12-19 DIAGNOSIS — M79675 Pain in left toe(s): Secondary | ICD-10-CM

## 2016-12-19 DIAGNOSIS — B351 Tinea unguium: Secondary | ICD-10-CM

## 2016-12-19 DIAGNOSIS — I70212 Atherosclerosis of native arteries of extremities with intermittent claudication, left leg: Secondary | ICD-10-CM | POA: Diagnosis not present

## 2016-12-19 DIAGNOSIS — M79674 Pain in right toe(s): Secondary | ICD-10-CM | POA: Diagnosis not present

## 2016-12-19 DIAGNOSIS — I739 Peripheral vascular disease, unspecified: Secondary | ICD-10-CM | POA: Diagnosis not present

## 2016-12-19 DIAGNOSIS — E139 Other specified diabetes mellitus without complications: Secondary | ICD-10-CM

## 2016-12-19 NOTE — Progress Notes (Signed)
Subjective: 71 y.o. returns the office today for painful, elongated, thickened toenails which he cannot trim himself. He also has 2 calluses to the left foot, along the medial arch and 5th toe. Denies any redness or drainage or any swelling. Denies any redness or drainage around the nails. Denies any acute changes since last appointment and no new complaints today. Denies any systemic complaints such as fevers, chills, nausea, vomiting.   Objective: AAO 3, NAD DP/PT pulses decreased- he sees Dr. Donzetta Matters with [facility] for this Protective sensation decreased with Derrel Nip monofilament Nails hypertrophic, dystrophic, elongated, brittle, discolored 10. There is tenderness overlying the nails 1-5 bilaterally. There is no surrounding erythema or drainage along the nail sites. Hyperkeratotic lesion to the dorsolateral aspect the left fifth toe as well as the medial arch of the left foot. Upon debridement no underlying ulceration, drainage or any signs of infection. Hammertoes present.  No open lesions or pre-ulcerative lesions are identified. No other areas of tenderness bilateral lower extremities. No overlying edema, erythema, increased warmth. No pain with calf compression, swelling, warmth, erythema.  Assessment: Patient presents with symptomatic onychomycosis; hyperkeratotic lesions, PVD  Plan: -Treatment options including alternatives, risks, complications were discussed -Nails sharply debrided 10 without complication/bleeding. -Hyperkeratotic lesions were debrided 2 without, occasions or bleeding. -Discussed daily foot inspection. If there are any changes, to call the office immediately.  -Follow-up in 3 months or sooner if any problems are to arise. In the meantime, encouraged to call the office with any questions, concerns, changes symptoms.  Celesta Gentile, DPM

## 2017-01-30 ENCOUNTER — Ambulatory Visit (INDEPENDENT_AMBULATORY_CARE_PROVIDER_SITE_OTHER): Payer: Medicare Other | Admitting: Podiatry

## 2017-01-30 DIAGNOSIS — B351 Tinea unguium: Secondary | ICD-10-CM

## 2017-01-30 DIAGNOSIS — M79676 Pain in unspecified toe(s): Secondary | ICD-10-CM | POA: Diagnosis not present

## 2017-01-30 DIAGNOSIS — G629 Polyneuropathy, unspecified: Secondary | ICD-10-CM

## 2017-01-30 DIAGNOSIS — M79674 Pain in right toe(s): Secondary | ICD-10-CM | POA: Diagnosis not present

## 2017-01-30 DIAGNOSIS — E109 Type 1 diabetes mellitus without complications: Secondary | ICD-10-CM

## 2017-01-30 DIAGNOSIS — E139 Other specified diabetes mellitus without complications: Secondary | ICD-10-CM

## 2017-01-30 DIAGNOSIS — M79675 Pain in left toe(s): Secondary | ICD-10-CM | POA: Diagnosis not present

## 2017-01-30 DIAGNOSIS — Q828 Other specified congenital malformations of skin: Secondary | ICD-10-CM | POA: Diagnosis not present

## 2017-01-30 NOTE — Progress Notes (Signed)
Subjective: 71 y.o. returns the office today for painful, elongated, thickened toenails which he cannot trim himself. He also has 2 calluses to the left foot, along the medial arch and 5th toe. Denies any redness or drainage or any swelling from the nails or callus sites. Denies any redness or drainage around the nails. Denies any acute changes since last appointment and no new complaints today. Denies any systemic complaints such as fevers, chills, nausea, vomiting.   Objective: AAO 3, NAD DP/PT pulses decreased- he sees Dr. Donzetta Matters with [facility] for this Protective sensation decreased with Derrel Nip monofilament Nails hypertrophic, dystrophic, elongated, brittle, discolored 10. There is tenderness overlying the nails 1-5 bilaterally. There is no surrounding erythema or drainage along the nail sites. Hyperkeratotic lesion to the dorsolateral aspect the bilateral fifth toe as well as the medial arch of the left foot and bilateral hallux. Upon debridement no underlying ulceration, drainage or any signs of infection. Hammertoes present.  No open lesions or pre-ulcerative lesions are identified. No other areas of tenderness bilateral lower extremities. No overlying edema, erythema, increased warmth. No pain with calf compression, swelling, warmth, erythema.  Assessment: Patient presents with symptomatic onychomycosis; hyperkeratotic lesions, PVD  Plan: -Treatment options including alternatives, risks, complications were discussed -Nails sharply debrided 10 without complication/bleeding. -Hyperkeratotic lesions were debrided 5 without complications or bleeding. -Discussed daily foot inspection. If there are any changes, to call the office immediately.  -Follow-up in 3 months or sooner if any problems are to arise. In the meantime, encouraged to call the office with any questions, concerns, changes symptoms.  Celesta Gentile, DPM

## 2017-03-12 ENCOUNTER — Encounter: Payer: Self-pay | Admitting: Family Medicine

## 2017-03-12 ENCOUNTER — Ambulatory Visit (INDEPENDENT_AMBULATORY_CARE_PROVIDER_SITE_OTHER): Payer: Medicare Other | Admitting: Family Medicine

## 2017-03-12 VITALS — BP 132/80 | HR 100 | Temp 98.7°F | Ht 65.0 in | Wt 144.0 lb

## 2017-03-12 DIAGNOSIS — M25571 Pain in right ankle and joints of right foot: Secondary | ICD-10-CM

## 2017-03-12 MED ORDER — PREDNISONE 20 MG PO TABS: 40.0000 mg | ORAL_TABLET | Freq: Every day | ORAL | 0 refills | Status: AC

## 2017-03-12 NOTE — Patient Instructions (Addendum)
Call your secondary insurance and see if they cover physicals/annual check ups.  Ice/cold pack over area for 10-15 min every 2-3 hours while awake.  OK to take Tylenol 1000 mg (2 extra strength tabs) or 975 mg (3 regular strength tabs) every 6 hours as needed.  Give [place] 2-3 business days to get the results of your labs back.   Let us know if you need anything.

## 2017-03-12 NOTE — Progress Notes (Signed)
Chief Complaint  Patient presents with  . Foot Swelling    right foot    Steven [name] here for right foot swelling.  Duration: 4 days  No injury or change in activity Pain the calf? No SOB? No No redness, felt feverish yesterday  ROS:  MSK- +ankle swelling/pain Lungs- no SOB  Past Medical History:  Diagnosis Date  . Allergy   . Arthritis    Right Hip  . Atrial fibrillation ([place])   . COPD (chronic obstructive pulmonary disease) ([place])   . Coronary artery disease    Lexiscan Myoview 7/14:  Inf scar with minimal reversibility, inf HK, EF 45%;  Low risk.  Promus LAD stent placed in [place].  100% RCA  . Diabetes mellitus without complication ([place])   . Dyslipidemia   . GERD (gastroesophageal reflux disease)    has used Zantac in the past   . Hernia   . Hyperlipidemia   . Hypertension   . Peripheral vascular disease ([place])    a. s/p R fem-pop BPG 01/2013 (Dr. [name])  . 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 Diabetes Mother   . Hypertension Mother   . Heart disease Mother   . Hyperlipidemia Mother   . Diabetes Father   . Heart disease Father   . Hyperlipidemia Father   . Hypertension Father   . Diabetes Sister   . Heart disease Sister   . Hyperlipidemia Sister   . Hypertension Sister   . Diabetes Brother   . Heart disease Brother   . Hyperlipidemia Brother   . Hypertension Brother   . Diabetes Son   . Heart disease Son   . Hyperlipidemia Son   . Hypertension Son   . Alcohol abuse Neg Hx   . Cancer Neg Hx   . Depression Neg Hx   . Early death Neg Hx   . Kidney disease Neg Hx   . Stroke Neg Hx   . Colon cancer Neg Hx   . Esophageal cancer Neg Hx   . Rectal cancer Neg Hx   . Stomach cancer Neg Hx    Past Surgical History:  Procedure Laterality Date  . ABDOMINAL AORTAGRAM N/A 12/10/2012   Procedure: ABDOMINAL Maxcine Ham;  Surgeon: Serafina Mitchell, MD;  Location: [facility]  CATH LAB;  Service: Cardiovascular;  Laterality: N/A;  . CARDIAC CATHETERIZATION     with stent placement  . COLONOSCOPY  March 2014  . FEMORAL-POPLITEAL BYPASS GRAFT Right 02/07/2013   Procedure: BYPASS GRAFT FEMORAL-POPLITEAL ARTERY- RIGHT;  Surgeon: Mal Misty, MD;  Location: [facility] OR;  Service: Vascular;  Laterality: Right;  Right femoral to below knee popliteal bypass using right non reversed great saphenous vein  . INGUINAL HERNIA REPAIR  2010   Right  . INTRAOPERATIVE ARTERIOGRAM Right 02/07/2013   Procedure: Attempted INTRA OPERATIVE ARTERIOGRAM;  Surgeon: Mal Misty, MD;  Location: [facility] OR;  Service: Vascular;  Laterality: Right;    Current Outpatient Prescriptions:  .  aspirin EC 81 MG tablet, Take 81 mg by mouth daily., Disp: , Rfl:  .  Blood Glucose Monitoring Suppl (ONETOUCH VERIO IQ SYSTEM) w/Device KIT, Use to check blood sugar 2 times per day., Disp: 1 kit, Rfl: 2 .  cetirizine (ZYRTEC) 10 MG tablet, Take 1 tablet (10 mg total) by mouth daily., Disp: 30 tablet, Rfl: 11 .  ezetimibe (ZETIA) 10 MG tablet, Take  1 tablet (10 mg total) by mouth daily., Disp: 90 tablet, Rfl: 3 .  glucose blood (ONETOUCH VERIO) test strip, Use to check blood sugar 2 times per day, Disp: 200 each, Rfl: 4 .  Insulin Glargine (TOUJEO SOLOSTAR) 300 UNIT/ML SOPN, Inject 25 Units into the skin daily., Disp: 1.5 mL, Rfl: 11 .  Insulin Pen Needle (BD PEN NEEDLE NANO U/F) 32G X 4 MM MISC, use as directed with INSULIN, Disp: 100 each, Rfl: 4 .  losartan (COZAAR) 100 MG tablet, TAKE 1 TABLET(100 MG) BY MOUTH DAILY BEFORE BREAKFAST, Disp: 90 tablet, Rfl: 3 .  metFORMIN (GLUCOPHAGE) 1000 MG tablet, TAKE 1 TABLET(1000 MG) BY MOUTH TWICE DAILY WITH A MEAL, Disp: 180 tablet, Rfl: 3 .  metoprolol succinate (TOPROL-XL) 50 MG 24 hr tablet, TAKE 1 TABLET BY MOUTH EVERY DAY WITH OR IMMEDIATELY FOLLOWING A MEAL, Disp: 30 tablet, Rfl: 0 .  mometasone (NASONEX) 50 MCG/ACT nasal spray, Place 2 sprays into the nose daily.,  Disp: 17 g, Rfl: 3 .  ONETOUCH DELICA LANCETS 09T MISC, Use to check blood sugar 2 times per day., Disp: 200 each, Rfl: 2 .  rosuvastatin (CRESTOR) 40 MG tablet, Take 1 tablet (40 mg total) by mouth daily., Disp: 30 tablet, Rfl: 11 .  traMADol (ULTRAM) 50 MG tablet, Take 1 tablet (50 mg total) by mouth every 8 (eight) hours as needed., Disp: 65 tablet, Rfl: 2 .  predniSONE (DELTASONE) 20 MG tablet, Take 2 tablets (40 mg total) by mouth daily with breakfast., Disp: 10 tablet, Rfl: 0  BP 132/80 (BP Location: Left Arm, Patient Position: Sitting, Cuff Size: Normal)   Pulse 100   Temp 98.7 F (37.1 C) (Oral)   Ht 5' 5" (1.651 m)   Wt 144 lb (65.3 kg)   SpO2 97%   BMI 23.96 kg/m  Gen- awake, alert, appears stated age Heart- RRR, no murmurs, +LE edema over R ankle 1+ pitting edema Lungs- CTAB, normal effort w/o accessory muscle use MSK- no calf pain; +TTP over ankle joint, no erythema or excessive warmth; neg squeeze and ant drawer, no bony tenderness Psych: Age appropriate judgment and insight  Acute right ankle pain - Plan: predniSONE (DELTASONE) 20 MG tablet, Uric acid  Orders as above. Pred burst. Will check urate.  F/u prn. Pt voiced understanding and agreement to the plan.  [place], DO 03/12/17  3:28 PM

## 2017-03-12 NOTE — Progress Notes (Signed)
Pre visit review using our clinic review tool, if applicable. No additional management support is needed unless otherwise documented below in the visit note. 

## 2017-03-13 LAB — URIC ACID: URIC ACID, SERUM: 5.9 mg/dL (ref 4.0–7.8)

## 2017-04-19 ENCOUNTER — Telehealth: Payer: Self-pay | Admitting: Podiatry

## 2017-05-01 ENCOUNTER — Ambulatory Visit: Payer: Medicare Other | Admitting: Podiatry

## 2017-05-14 ENCOUNTER — Ambulatory Visit (HOSPITAL_COMMUNITY)
Admission: RE | Admit: 2017-05-14 | Discharge: 2017-05-14 | Disposition: A | Discharge status: Home / Self Care | Payer: Medicare Other | Source: Ambulatory Visit | Attending: Family | Admitting: Family

## 2017-05-14 ENCOUNTER — Ambulatory Visit (INDEPENDENT_AMBULATORY_CARE_PROVIDER_SITE_OTHER)
Admission: RE | Admit: 2017-05-14 | Discharge: 2017-05-14 | Disposition: A | Discharge status: Home / Self Care | Payer: Medicare Other | Source: Ambulatory Visit | Attending: Family | Admitting: Family

## 2017-05-14 ENCOUNTER — Ambulatory Visit (INDEPENDENT_AMBULATORY_CARE_PROVIDER_SITE_OTHER): Payer: Medicare Other | Admitting: Family

## 2017-05-14 ENCOUNTER — Encounter: Payer: Self-pay | Admitting: Family

## 2017-05-14 VITALS — BP 178/70 | HR 63 | Temp 98.3°F | Resp 16 | Ht 65.0 in | Wt 146.0 lb

## 2017-05-14 DIAGNOSIS — I70212 Atherosclerosis of native arteries of extremities with intermittent claudication, left leg: Secondary | ICD-10-CM

## 2017-05-14 DIAGNOSIS — E785 Hyperlipidemia, unspecified: Secondary | ICD-10-CM | POA: Diagnosis not present

## 2017-05-14 DIAGNOSIS — Z87891 Personal history of nicotine dependence: Secondary | ICD-10-CM | POA: Insufficient documentation

## 2017-05-14 DIAGNOSIS — R0989 Other specified symptoms and signs involving the circulatory and respiratory systems: Secondary | ICD-10-CM | POA: Diagnosis not present

## 2017-05-14 DIAGNOSIS — Z95828 Presence of other vascular implants and grafts: Secondary | ICD-10-CM

## 2017-05-14 DIAGNOSIS — I6523 Occlusion and stenosis of bilateral carotid arteries: Secondary | ICD-10-CM | POA: Insufficient documentation

## 2017-05-14 DIAGNOSIS — E1151 Type 2 diabetes mellitus with diabetic peripheral angiopathy without gangrene: Secondary | ICD-10-CM | POA: Diagnosis not present

## 2017-05-14 DIAGNOSIS — I10 Essential (primary) hypertension: Secondary | ICD-10-CM | POA: Diagnosis not present

## 2017-05-14 LAB — VAS US CAROTID
LCCADDIAS: -11 cm/s
LEFT ECA DIAS: -2 cm/s
LEFT VERTEBRAL DIAS: 23 cm/s
LICADDIAS: -20 cm/s
LICAPSYS: -67 cm/s
Left CCA dist sys: -71 cm/s
Left CCA prox dias: 15 cm/s
Left CCA prox sys: 111 cm/s
Left ICA dist sys: -87 cm/s
Left ICA prox dias: -14 cm/s
RIGHT CCA MID DIAS: 12 cm/s
RIGHT ECA DIAS: -5 cm/s
RIGHT VERTEBRAL DIAS: -20 cm/s
Right CCA prox dias: 15 cm/s
Right CCA prox sys: 117 cm/s
Right cca dist sys: -65 cm/s

## 2017-05-14 NOTE — Progress Notes (Signed)
[facility] [place] HISTORY AND PHYSICAL   MRN : [id number]  History of Present Illness:   Steven [name] is a 71 y.o. male patient of Dr. Kellie Simmering who returns status post right femoral-popliteal bypass with saphenous vein graft in July of 2014 for severe claudication right leg.  He denies non healing wounds.  He has bilateral hip pain if he walks fast or a long distance, denies hip pain in the morning. He also has bilateral calf painwhen he walks fast, relieved by rest.  He was scheduled for an aortogram by Dr. Donzetta Matters in March 2018 with possible intervention, but pt cancelled due to his wife having foot surgery.  Pt returns today for routine follow up.   At his 09-06-16 visit he c/o left foot feeling heavy after walking fast at 50 feet, relieved by rest. He denies left foot feeling heavy if he walks at a normal pace; also he wears steel toed boots. Dr. Ronnald Ramp, his PCP, referred him for this.   Pt indicates that his life is not limited by his hips and calf pain, he is able to do anything he wants to do.   He has CAD, sees a cardiologist.  Pt Diabetic: Yes, Review of records: A1C on 08-22-16 was 8.2, serum creatinine was 1.79 at that time; A1C was 7.9 on 02/07/16. He states he has not had an A1C done since then, states he will see a new PCP in November 2018. Pt smoker: former smoker, quit in 2012  Pt meds include: Statin: yes, resumed June 2016 Betablocker: Yes ASA: Yes Other anticoagulants/antiplatelets: no    Current Outpatient Prescriptions  Medication Sig Dispense Refill  . aspirin EC 81 MG tablet Take 81 mg by mouth daily.    . Blood Glucose Monitoring Suppl (ONETOUCH VERIO IQ SYSTEM) w/Device KIT Use to check blood sugar 2 times per day. 1 kit 2  . cetirizine (ZYRTEC) 10 MG tablet Take 1 tablet (10 mg total) by mouth daily. 30 tablet 11  . ezetimibe (ZETIA) 10 MG tablet Take 1 tablet (10 mg total) by mouth daily. 90 tablet 3  . glucose blood (ONETOUCH  VERIO) test strip Use to check blood sugar 2 times per day 200 each 4  . Insulin Glargine (TOUJEO SOLOSTAR) 300 UNIT/ML SOPN Inject 25 Units into the skin daily. 1.5 mL 11  . Insulin Pen Needle (BD PEN NEEDLE NANO U/F) 32G X 4 MM MISC use as directed with INSULIN 100 each 4  . losartan (COZAAR) 100 MG tablet TAKE 1 TABLET(100 MG) BY MOUTH DAILY BEFORE BREAKFAST 90 tablet 3  . metFORMIN (GLUCOPHAGE) 1000 MG tablet TAKE 1 TABLET(1000 MG) BY MOUTH TWICE DAILY WITH A MEAL 180 tablet 3  . metoprolol succinate (TOPROL-XL) 50 MG 24 hr tablet TAKE 1 TABLET BY MOUTH EVERY DAY WITH OR IMMEDIATELY FOLLOWING A MEAL 30 tablet 0  . mometasone (NASONEX) 50 MCG/ACT nasal spray Place 2 sprays into the nose daily. 17 g 3  . ONETOUCH DELICA LANCETS 87O MISC Use to check blood sugar 2 times per day. 200 each 2  . rosuvastatin (CRESTOR) 40 MG tablet Take 1 tablet (40 mg total) by mouth daily. 30 tablet 11  . traMADol (ULTRAM) 50 MG tablet Take 1 tablet (50 mg total) by mouth every 8 (eight) hours as needed. 65 tablet 2   No current facility-administered medications for this visit.     Past Medical History:  Diagnosis Date  . Allergy   . Arthritis  Right Hip  . Atrial fibrillation ([place])   . COPD (chronic obstructive pulmonary disease) ([place])   . Coronary artery disease    Lexiscan Myoview 7/14:  Inf scar with minimal reversibility, inf HK, EF 45%;  Low risk.  Promus LAD stent placed in [employer].  100% RCA  . Diabetes mellitus without complication ([place])   . Dyslipidemia   . GERD (gastroesophageal reflux disease)    has used Zantac in the past   . Hernia   . Hyperlipidemia   . Hypertension   . Peripheral vascular disease ([place])    a. s/p R fem-pop BPG 01/2013 (Dr. [name])  . 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    Social History Social History  Substance Use Topics  . Smoking status: Former Smoker    Types: Cigarettes    Quit date: 02/04/2011  . Smokeless  tobacco: Never Used     Comment: Quit tobacco 2 years ago.    . Alcohol use 1.8 oz/week    3 Cans of beer per week     Comment: everyday, beer 40 ozs & alcohol- 1-2 shots     Family History Family History  Problem Relation Age of Onset  . Diabetes Mother   . Hypertension Mother   . Heart disease Mother   . Hyperlipidemia Mother   . Diabetes Father   . Heart disease Father   . Hyperlipidemia Father   . Hypertension Father   . Diabetes Sister   . Heart disease Sister   . Hyperlipidemia Sister   . Hypertension Sister   . Diabetes Brother   . Heart disease Brother   . Hyperlipidemia Brother   . Hypertension Brother   . Diabetes Son   . Heart disease Son   . Hyperlipidemia Son   . Hypertension Son   . Alcohol abuse Neg Hx   . Cancer Neg Hx   . Depression Neg Hx   . Early death Neg Hx   . Kidney disease Neg Hx   . Stroke Neg Hx   . Colon cancer Neg Hx   . Esophageal cancer Neg Hx   . Rectal cancer Neg Hx   . Stomach cancer Neg Hx     Surgical History Past Surgical History:  Procedure Laterality Date  . ABDOMINAL AORTAGRAM N/A 12/10/2012   Procedure: ABDOMINAL Maxcine Ham;  Surgeon: Serafina Mitchell, MD;  Location: [facility] CATH LAB;  Service: Cardiovascular;  Laterality: N/A;  . CARDIAC CATHETERIZATION     with stent placement  . COLONOSCOPY  March 2014  . FEMORAL-POPLITEAL BYPASS GRAFT Right 02/07/2013   Procedure: BYPASS GRAFT FEMORAL-POPLITEAL ARTERY- RIGHT;  Surgeon: Mal Misty, MD;  Location: [facility] OR;  Service: Vascular;  Laterality: Right;  Right femoral to below knee popliteal bypass using right non reversed great saphenous vein  . INGUINAL HERNIA REPAIR  2010   Right  . INTRAOPERATIVE ARTERIOGRAM Right 02/07/2013   Procedure: Attempted INTRA OPERATIVE ARTERIOGRAM;  Surgeon: Mal Misty, MD;  Location: [facility] OR;  Service: Vascular;  Laterality: Right;    No Known Allergies  Current Outpatient Prescriptions  Medication Sig Dispense Refill  . aspirin EC 81 MG tablet  Take 81 mg by mouth daily.    . Blood Glucose Monitoring Suppl (ONETOUCH VERIO IQ SYSTEM) w/Device KIT Use to check blood sugar 2 times per day. 1 kit 2  . cetirizine (ZYRTEC) 10 MG tablet Take 1 tablet (10 mg total) by mouth daily. [place]  tablet 11  . ezetimibe (ZETIA) 10 MG tablet Take 1 tablet (10 mg total) by mouth daily. 90 tablet 3  . glucose blood (ONETOUCH VERIO) test strip Use to check blood sugar 2 times per day 200 each 4  . Insulin Glargine (TOUJEO SOLOSTAR) 300 UNIT/ML SOPN Inject 25 Units into the skin daily. 1.5 mL 11  . Insulin Pen Needle (BD PEN NEEDLE NANO U/F) 32G X 4 MM MISC use as directed with INSULIN 100 each 4  . losartan (COZAAR) 100 MG tablet TAKE 1 TABLET(100 MG) BY MOUTH DAILY BEFORE BREAKFAST 90 tablet 3  . metFORMIN (GLUCOPHAGE) 1000 MG tablet TAKE 1 TABLET(1000 MG) BY MOUTH TWICE DAILY WITH A MEAL 180 tablet 3  . metoprolol succinate (TOPROL-XL) 50 MG 24 hr tablet TAKE 1 TABLET BY MOUTH EVERY DAY WITH OR IMMEDIATELY FOLLOWING A MEAL 30 tablet 0  . mometasone (NASONEX) 50 MCG/ACT nasal spray Place 2 sprays into the nose daily. 17 g 3  . ONETOUCH DELICA LANCETS 01X MISC Use to check blood sugar 2 times per day. 200 each 2  . rosuvastatin (CRESTOR) 40 MG tablet Take 1 tablet (40 mg total) by mouth daily. 30 tablet 11  . traMADol (ULTRAM) 50 MG tablet Take 1 tablet (50 mg total) by mouth every 8 (eight) hours as needed. 65 tablet 2   No current facility-administered medications for this visit.      REVIEW OF SYSTEMS: See HPI for pertinent positives and negatives.  Physical Examination Vitals:   05/14/17 1213 05/14/17 1219  BP: (!) 183/79 (!) 178/70  Pulse: 63   Resp: 16   Temp: 98.3 F (36.8 C)   TempSrc: Oral   SpO2: 99%   Weight: 146 lb (66.2 kg)   Height: '5\' 5"'  (1.651 m)    Body mass index is 24.3 kg/m.  General:  A&O x 3, WDWN. Gait: normal Eyes: PERRLA. Pulmonary: Respirations are non labored, CTAB, without wheezes , rales,or rhonchi. Cardiac:  regular rhythm and rate, nodetected murmur.     Carotid Bruits Right Left   Negative Positive    Abdominal aortic pulse is not palpable. Radial pulses: are 2+ right and 1+ leftpalpable    VASCULAR EXAM: Extremitieswithout ischemic changes  without Gangrene; without open wounds.     LE Pulses Right Left   FEMORAL 3+ palpable 2+ palpable    POPLITEAL 2+ palpable  not palpable   POSTERIOR TIBIAL 2+ palpable  not palpable    DORSALIS PEDIS  ANTERIOR TIBIAL not palpable  not palpable    Abdomen: soft, NT, no palpable masses. Skin: no rashes, no ulcers. Musculoskeletal: no muscle wasting or atrophy. Neurologic: A&O X 3; Appropriate Affect; MOTOR FUNCTION: moving all extremities equally, motor strength 5/5 throughout. Speech is fluent/normal. CN 2-12 intact.   ASSESSMENT:  TRAVONTAE FREIBERGER is a 71 y.o. male who is status post right femoral-popliteal bypass with saphenous vein graft in July of 2014 for severe claudication right leg. He is having no right leg claudication symptoms.  He walks a great deal as part of his job which facilitates the formation of collateral arterial perfusion. He no longer c/o life limiting claudication sx's. He has no signs of ischemiain his feet or toes which are pink and warm.  He indicates that he has been referred to an endocrinologist later in February, 2018.   His atherosclerotic risk factors include uncontrolledDM, former smoker, elevated blood pressure, and CAD.   DATA  Carotid Duplex (05/14/17): <40% bilateral ICA stenosis.  Bilateral  vertebral artery flow is antegrade.  Bilateral subclavian artery waveforms are normal.  No prior carotid exam for comparison.   Right LE Arterial Duplex (05/14/17):  Inflow  with stenosis at 50-74% (236 cm/s) No significant change compared to the last exam on 05-10-16 and 09-06-16.    ABI (Date: 05/14/2017):  R:   ABI: 0.96 (was 0.85 on 09-06-16),   PT: bi  DP: bi  TBI:  0.81 (was 0.40)  L:   ABI: 0.52 (was 0.40),   PT: mono  DP: mono  TBI: 0.29 (was 0.12)  Improved bilateral ABI and TBI.  PLAN:  Based on the patient's vascular studies and examination, pt will return to clinic in 1 year with ABI's and right LE arterial duplex, carotid duplex in 3 years.  I advised him to return sooner if the pain in his left calf worsens.  I discussed in depth with the patient the nature of atherosclerosis, and emphasized the importance of maximal medical management including strict control of blood pressure, blood glucose, and lipid levels, obtaining regular exercise, and cessation of smoking.  The patient is aware that without maximal medical management the underlying atherosclerotic disease process will progress, limiting the benefit of any interventions.  The patient was given information about stroke prevention and what symptoms should prompt the patient to seek immediate medical care.  The patient was given information about PAD including signs, symptoms, treatment, what symptoms should prompt the patient to seek immediate medical care, and risk reduction measures to take.  Thank you for allowing [place] to participate in this patient's care.  Clemon Chambers, RN, MSN, FNP-C [facility] Office: [phone]  Clinic MD: Trula Slade 05/14/2017 12:37 PM

## 2017-05-14 NOTE — Patient Instructions (Signed)
Peripheral Vascular Disease Peripheral vascular disease (PVD) is a disease of the blood vessels that are not part of your heart and brain. A simple term for PVD is poor circulation. In most cases, PVD narrows the blood vessels that carry blood from your heart to the rest of your body. This can result in a decreased supply of blood to your arms, legs, and internal organs, like your stomach or kidneys. However, it most often affects a person's lower legs and feet. There are two types of PVD.  Organic PVD. This is the more common type. It is caused by damage to the structure of blood vessels.  Functional PVD. This is caused by conditions that make blood vessels contract and tighten (spasm). Without treatment, PVD tends to get worse over time. PVD can also lead to acute ischemic limb. This is when an arm or limb suddenly has trouble getting enough blood. This is a medical emergency. Follow these instructions at home:  Take medicines only as told by your doctor.  Do not use any tobacco products, including cigarettes, chewing tobacco, or electronic cigarettes. If you need help quitting, ask your doctor.  Lose weight if you are overweight, and maintain a healthy weight as told by your doctor.  Eat a diet that is low in fat and cholesterol. If you need help, ask your doctor.  Exercise regularly. Ask your doctor for some good activities for you.  Take good care of your feet.  Wear comfortable shoes that fit well.  Check your feet often for any cuts or sores. Contact a doctor if:  You have cramps in your legs while walking.  You have leg pain when you are at rest.  You have coldness in a leg or foot.  Your skin changes.  You are unable to get or have an erection (erectile dysfunction).  You have cuts or sores on your feet that are not healing. Get help right away if:  Your arm or leg turns cold and blue.  Your arms or legs become red, warm, swollen, painful, or numb.  You have  chest pain or trouble breathing.  You suddenly have weakness in your face, arm, or leg.  You become very confused or you cannot speak.  You suddenly have a very bad headache.  You suddenly cannot see. This information is not intended to replace advice given to you by your health care provider. Make sure you discuss any questions you have with your health care provider. Document Released: 10/04/2009 Document Revised: 12/16/2015 Document Reviewed: 12/18/2013 Elsevier Interactive Patient Education  2017 Elsevier Inc.    Stroke Prevention Some medical conditions and behaviors are associated with an increased chance of having a stroke. You may prevent a stroke by making healthy choices and managing medical conditions. How can I reduce my risk of having a stroke?  Stay physically active. Get at least 30 minutes of activity on most or all days.  Do not smoke. It may also be helpful to avoid exposure to secondhand smoke.  Limit alcohol use. Moderate alcohol use is considered to be:  No more than 2 drinks per day for men.  No more than 1 drink per day for nonpregnant women.  Eat healthy foods. This involves:  Eating 5 or more servings of fruits and vegetables a day.  Making dietary changes that address high blood pressure (hypertension), high cholesterol, diabetes, or obesity.  Manage your cholesterol levels.  Making food choices that are high in fiber and low in saturated fat,   trans fat, and cholesterol may control cholesterol levels.  Take any prescribed medicines to control cholesterol as directed by your health care provider.  Manage your diabetes.  Controlling your carbohydrate and sugar intake is recommended to manage diabetes.  Take any prescribed medicines to control diabetes as directed by your health care provider.  Control your hypertension.  Making food choices that are low in salt (sodium), saturated fat, trans fat, and cholesterol is recommended to manage  hypertension.  Ask your health care provider if you need treatment to lower your blood pressure. Take any prescribed medicines to control hypertension as directed by your health care provider.  If you are 18-39 years of age, have your blood pressure checked every 3-5 years. If you are 40 years of age or older, have your blood pressure checked every year.  Maintain a healthy weight.  Reducing calorie intake and making food choices that are low in sodium, saturated fat, trans fat, and cholesterol are recommended to manage weight.  Stop drug abuse.  Avoid taking birth control pills.  Talk to your health care provider about the risks of taking birth control pills if you are over 35 years old, smoke, get migraines, or have ever had a blood clot.  Get evaluated for sleep disorders (sleep apnea).  Talk to your health care provider about getting a sleep evaluation if you snore a lot or have excessive sleepiness.  Take medicines only as directed by your health care provider.  For some people, aspirin or blood thinners (anticoagulants) are helpful in reducing the risk of forming abnormal blood clots that can lead to stroke. If you have the irregular heart rhythm of atrial fibrillation, you should be on a blood thinner unless there is a good reason you cannot take them.  Understand all your medicine instructions.  Make sure that other conditions (such as anemia or atherosclerosis) are addressed. Get help right away if:  You have sudden weakness or numbness of the face, arm, or leg, especially on one side of the body.  Your face or eyelid droops to one side.  You have sudden confusion.  You have trouble speaking (aphasia) or understanding.  You have sudden trouble seeing in one or both eyes.  You have sudden trouble walking.  You have dizziness.  You have a loss of balance or coordination.  You have a sudden, severe headache with no known cause.  You have new chest pain or an  irregular heartbeat. Any of these symptoms may represent a serious problem that is an emergency. Do not wait to see if the symptoms will go away. Get medical help at once. Call your local emergency services (911 in [place] Do not drive yourself to the hospital. This information is not intended to replace advice given to you by your health care provider. Make sure you discuss any questions you have with your health care provider. Document Released: 08/17/2004 Document Revised: 12/16/2015 Document Reviewed: 01/10/2013 Elsevier Interactive Patient Education  2017 Elsevier Inc.   

## 2017-05-18 ENCOUNTER — Encounter: Payer: Medicare Other | Admitting: Family Medicine

## 2017-05-18 ENCOUNTER — Telehealth: Payer: Self-pay | Admitting: Family Medicine

## 2017-05-18 DIAGNOSIS — Z0289 Encounter for other administrative examinations: Secondary | ICD-10-CM

## 2017-05-18 NOTE — Telephone Encounter (Signed)
No charge. 

## 2017-05-18 NOTE — Telephone Encounter (Signed)
Pt called in at 8:19 to reschedule his CPE apt that was scheduled for today. Pt rescheduled his apt to 05/24/17 at 7:30 instead.    Should pt be charged?

## 2017-05-24 ENCOUNTER — Ambulatory Visit (INDEPENDENT_AMBULATORY_CARE_PROVIDER_SITE_OTHER): Payer: BLUE CROSS/BLUE SHIELD | Admitting: Family Medicine

## 2017-05-24 ENCOUNTER — Encounter: Payer: Self-pay | Admitting: Family Medicine

## 2017-05-24 VITALS — BP 140/84 | HR 88 | Temp 98.0°F | Ht 65.0 in | Wt 145.0 lb

## 2017-05-24 DIAGNOSIS — Z136 Encounter for screening for cardiovascular disorders: Secondary | ICD-10-CM

## 2017-05-24 DIAGNOSIS — Z23 Encounter for immunization: Secondary | ICD-10-CM | POA: Diagnosis not present

## 2017-05-24 DIAGNOSIS — Z1159 Encounter for screening for other viral diseases: Secondary | ICD-10-CM

## 2017-05-24 DIAGNOSIS — E118 Type 2 diabetes mellitus with unspecified complications: Secondary | ICD-10-CM | POA: Diagnosis not present

## 2017-05-24 DIAGNOSIS — Z794 Long term (current) use of insulin: Secondary | ICD-10-CM | POA: Diagnosis not present

## 2017-05-24 DIAGNOSIS — Z125 Encounter for screening for malignant neoplasm of prostate: Secondary | ICD-10-CM

## 2017-05-24 DIAGNOSIS — Z0000 Encounter for general adult medical examination without abnormal findings: Secondary | ICD-10-CM

## 2017-05-24 LAB — COMPREHENSIVE METABOLIC PANEL
ALBUMIN: 4.1 g/dL (ref 3.5–5.2)
ALT: 24 U/L (ref 0–53)
AST: 26 U/L (ref 0–37)
Alkaline Phosphatase: 71 U/L (ref 39–117)
BUN: 12 mg/dL (ref 6–23)
CHLORIDE: 105 meq/L (ref 96–112)
CO2: 28 meq/L (ref 19–32)
Calcium: 9.6 mg/dL (ref 8.4–10.5)
Creatinine, Ser: 1.42 mg/dL (ref 0.40–1.50)
GFR: 63.55 mL/min (ref >60.00)
GLUCOSE: 177 mg/dL — AB (ref 70–99)
POTASSIUM: 4.2 meq/L (ref 3.5–5.1)
SODIUM: 140 meq/L (ref 135–145)
Total Bilirubin: 0.5 mg/dL (ref 0.2–1.2)
Total Protein: 6.6 g/dL (ref 6.0–8.3)

## 2017-05-24 LAB — PSA, MEDICARE: PSA: 3.82 ng/mL (ref 0.10–4.00)

## 2017-05-24 LAB — CBC
HCT: 40.8 % (ref 39.0–52.0)
Hemoglobin: 13.3 g/dL (ref 13.0–17.0)
MCHC: 32.6 g/dL (ref 30.0–36.0)
MCV: 89 fl (ref 78.0–100.0)
Platelets: 147 10*3/uL — ABNORMAL LOW (ref 150.0–400.0)
RBC: 4.59 Mil/uL (ref 4.22–5.81)
RDW: 15 % (ref 11.5–15.5)
WBC: 5.4 10*3/uL (ref 4.0–10.5)

## 2017-05-24 LAB — HEMOGLOBIN A1C: HEMOGLOBIN A1C: 7.6 % — AB (ref 4.6–6.5)

## 2017-05-24 LAB — MICROALBUMIN / CREATININE URINE RATIO
Creatinine,U: 143.5 mg/dL
Microalb Creat Ratio: 51.6 mg/g — ABNORMAL HIGH (ref 0.0–30.0)
Microalb, Ur: 74 mg/dL — ABNORMAL HIGH (ref 0.0–1.9)

## 2017-05-24 LAB — LIPID PANEL
Cholesterol: 156 mg/dL (ref 0–200)
HDL: 67.2 mg/dL (ref >39.00)
LDL CALC: 68 mg/dL (ref 0–99)
NONHDL: 88.44
Total CHOL/HDL Ratio: 2
Triglycerides: 104 mg/dL (ref 0.0–149.0)
VLDL: 20.8 mg/dL (ref 0.0–40.0)

## 2017-05-24 NOTE — Progress Notes (Signed)
Chief Complaint  Patient presents with  . Annual Exam    Well Male Steven [name] is here for a complete physical.   His last physical was >1 year ago.  Current diet: in general, a "healthy" diet   Current exercise: physically active at work Weight trend: stable Seat belt? Yes.    Health maintenance Colonoscopy- Yes Tetanus- Yes Hep C- No Prostate cancer screening- Yes Pneumonia vaccine- Yes AAA screening- No  Past Medical History:  Diagnosis Date  . Allergy   . Arthritis    Right Hip  . Atrial fibrillation ([place])   . Controlled diabetes mellitus type 2 with complications ([place]) 42/09/5359  . COPD (chronic obstructive pulmonary disease) ([place])   . Coronary artery disease    Lexiscan Myoview 7/14:  Inf scar with minimal reversibility, inf HK, EF 45%;  Low risk.  Promus LAD stent placed in [name].  100% RCA  . Diabetes mellitus without complication ([place])   . Dyslipidemia   . GERD (gastroesophageal reflux disease)    has used Zantac in the past   . Hernia   . Hyperlipidemia   . Hypertension   . Peripheral vascular disease ([place])    a. s/p R fem-pop BPG 01/2013 (Dr. [name])  . 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 ABDOMINAL AORTAGRAM N/A 12/10/2012   Procedure: ABDOMINAL Maxcine Ham;  Surgeon: Serafina Mitchell, MD;  Location: [facility] CATH LAB;  Service: Cardiovascular;  Laterality: N/A;  . CARDIAC CATHETERIZATION     with stent placement  . COLONOSCOPY  March 2014  . FEMORAL-POPLITEAL BYPASS GRAFT Right 02/07/2013   Procedure: BYPASS GRAFT FEMORAL-POPLITEAL ARTERY- RIGHT;  Surgeon: Mal Misty, MD;  Location: [facility] OR;  Service: Vascular;  Laterality: Right;  Right femoral to below knee popliteal bypass using right non reversed great saphenous vein  . INGUINAL HERNIA REPAIR  2010   Right  . INTRAOPERATIVE ARTERIOGRAM Right 02/07/2013   Procedure: Attempted INTRA OPERATIVE ARTERIOGRAM;   Surgeon: Mal Misty, MD;  Location: [facility] OR;  Service: Vascular;  Laterality: Right;   Medications  Current Outpatient Prescriptions on File Prior to Visit  Medication Sig Dispense Refill  . aspirin EC 81 MG tablet Take 81 mg by mouth daily.    . Blood Glucose Monitoring Suppl (ONETOUCH VERIO IQ SYSTEM) w/Device KIT Use to check blood sugar 2 times per day. 1 kit 2  . cetirizine (ZYRTEC) 10 MG tablet Take 1 tablet (10 mg total) by mouth daily. 30 tablet 11  . ezetimibe (ZETIA) 10 MG tablet Take 1 tablet (10 mg total) by mouth daily. 90 tablet 3  . glucose blood (ONETOUCH VERIO) test strip Use to check blood sugar 2 times per day 200 each 4  . Insulin Glargine (TOUJEO SOLOSTAR) 300 UNIT/ML SOPN Inject 25 Units into the skin daily. 1.5 mL 11  . Insulin Pen Needle (BD PEN NEEDLE NANO U/F) 32G X 4 MM MISC use as directed with INSULIN 100 each 4  . losartan (COZAAR) 100 MG tablet TAKE 1 TABLET(100 MG) BY MOUTH DAILY BEFORE BREAKFAST 90 tablet 3  . metFORMIN (GLUCOPHAGE) 1000 MG tablet TAKE 1 TABLET(1000 MG) BY MOUTH TWICE DAILY WITH A MEAL 180 tablet 3  . metoprolol succinate (TOPROL-XL) 50 MG 24 hr tablet TAKE 1 TABLET BY MOUTH EVERY DAY WITH OR IMMEDIATELY FOLLOWING A MEAL 30 tablet 0  . mometasone (NASONEX) 50 MCG/ACT nasal  spray Place 2 sprays into the nose daily. 17 g 3  . ONETOUCH DELICA LANCETS 08X MISC Use to check blood sugar 2 times per day. 200 each 2  . rosuvastatin (CRESTOR) 40 MG tablet Take 1 tablet (40 mg total) by mouth daily. 30 tablet 11  . traMADol (ULTRAM) 50 MG tablet Take 1 tablet (50 mg total) by mouth every 8 (eight) hours as needed. 65 tablet 2   Allergies No Known Allergies Family History Family History  Problem Relation Age of Onset  . Diabetes Mother   . Hypertension Mother   . Heart disease Mother   . Hyperlipidemia Mother   . Diabetes Father   . Heart disease Father   . Hyperlipidemia Father   . Hypertension Father   . Diabetes Sister   . Heart disease  Sister   . Hyperlipidemia Sister   . Hypertension Sister   . Diabetes Brother   . Heart disease Brother   . Hyperlipidemia Brother   . Hypertension Brother   . Diabetes Son   . Heart disease Son   . Hyperlipidemia Son   . Hypertension Son   . Alcohol abuse Neg Hx   . Cancer Neg Hx   . Depression Neg Hx   . Early death Neg Hx   . Kidney disease Neg Hx   . Stroke Neg Hx   . Colon cancer Neg Hx   . Esophageal cancer Neg Hx   . Rectal cancer Neg Hx   . Stomach cancer Neg Hx     Review of Systems: Constitutional:  no unexpected change in weight, no fevers or chills Eye:  no recent significant change in vision Ear/Nose/Mouth/Throat:  Ears:  no tinnitus or hearing loss Nose/Mouth/Throat:  no complaints of nasal congestion or bleeding, no sore throat and oral sores Cardiovascular:  no chest pain, no palpitations Respiratory:  no cough and no shortness of breath Gastrointestinal:  no abdominal pain, no change in bowel habits, no nausea, vomiting, diarrhea, or constipation and no black or bloody stool  GU:  Male: negative for dysuria, frequency, and incontinence and negative for prostate symptoms Musculoskeletal/Extremities:  no pain of the joints Integumentary (Skin):  no abnormal skin lesions reported Neurologic:  no headaches, no numbness, tingling Endocrine:  No weight changes Hematologic/Lymphatic:  no abnormal bleeding  Exam BP 140/84 (BP Location: Left Arm, Patient Position: Sitting, Cuff Size: Normal)   Pulse 88   Temp 98 F (36.7 C) (Oral)   Ht '5\' 5"'  (1.651 m)   Wt 145 lb (65.8 kg)   SpO2 97%   BMI 24.13 kg/m  General:  well developed, well nourished, in no apparent distress Skin:  no significant moles, warts, or growths Head:  no masses, lesions, or tenderness Eyes:  pupils equal and round, sclera anicteric without injection Ears:  canals without lesions, TMs shiny without retraction, no obvious effusion, no erythema Nose:  nares patent, septum midline, mucosa  normal Throat/Pharynx:  lips and gingiva without lesion; tongue and uvula midline; non-inflamed pharynx; no exudates or postnasal drainage Neck: neck supple without adenopathy, thyromegaly, or masses Lungs:  clear to auscultation, breath sounds equal bilaterally, no respiratory distress Cardio:  regular rate and rhythm without murmurs, heart sounds without clicks or rubs Abdomen:  abdomen soft, nontender; bowel sounds normal; no masses or organomegaly Genital (male): circumcised penis, no lesions or discharge; testes present bilaterally without masses or tenderness Rectal: Deferred Musculoskeletal:  symmetrical muscle groups noted without atrophy or deformity Extremities:  no clubbing, cyanosis,  or edema, no deformities, no skin discoloration Neuro:  gait normal; deep tendon reflexes normal and symmetric Psych: well oriented with normal range of affect and appropriate judgment/insight  Assessment and Plan  Well adult exam - Plan: CBC, Comprehensive metabolic panel, Lipid panel  Need for influenza vaccination - Plan: Flu vaccine HIGH DOSE PF (Fluzone High dose)  Screening for AAA (abdominal aortic aneurysm) - Plan: US Aorta Initial Medicare Screen  Screening for malignant neoplasm of prostate - Plan: PSA, Medicare  Encounter for hepatitis C screening test for low risk patient - Plan: Hepatitis C antibody  Controlled type 2 diabetes mellitus with complication, with long-term current use of insulin ([place]) - Plan: Hemoglobin A1c, Microalbumin / creatinine urine ratio   Well 71 y.o. male. Counseled on diet and exercise. Counseled on risks and benefits of prostate cancer screening.  He agrees to undergo testing. Other orders as above. Follow up in 6 mo pending above.  The patient voiced understanding and agreement to the plan.  [place], DO 05/24/17 8:52 AM

## 2017-05-24 NOTE — Progress Notes (Signed)
Pre visit review using our clinic review tool, if applicable. No additional management support is needed unless otherwise documented below in the visit note. 

## 2017-05-24 NOTE — Patient Instructions (Signed)
If you do not hear anything about your ultrasound in the next week, call our office and ask for an update.  Aim to do some physical exertion for 150 minutes per week. This is typically divided into 5 days per week, 30 minutes per day. The activity should be enough to get your heart rate up. Anything is better than nothing if you have time constraints.  Let us know if you need anything.

## 2017-05-25 ENCOUNTER — Telehealth: Payer: Self-pay | Admitting: Family Medicine

## 2017-05-25 ENCOUNTER — Other Ambulatory Visit: Payer: Self-pay | Admitting: Family Medicine

## 2017-05-25 DIAGNOSIS — R809 Proteinuria, unspecified: Secondary | ICD-10-CM

## 2017-05-25 DIAGNOSIS — R972 Elevated prostate specific antigen [PSA]: Secondary | ICD-10-CM

## 2017-05-25 LAB — HEPATITIS C ANTIBODY
Hepatitis C Ab: NONREACTIVE
SIGNAL TO CUT-OFF: 0.01 (ref <1.00)

## 2017-05-25 NOTE — Telephone Encounter (Signed)
Patient is returning [place] call for lab results.

## 2017-05-27 ENCOUNTER — Ambulatory Visit (HOSPITAL_BASED_OUTPATIENT_CLINIC_OR_DEPARTMENT_OTHER)
Admission: RE | Admit: 2017-05-27 | Discharge: 2017-05-27 | Disposition: A | Discharge status: Home / Self Care | Payer: Medicare Other | Source: Ambulatory Visit | Attending: Family Medicine | Admitting: Family Medicine

## 2017-05-27 DIAGNOSIS — Z136 Encounter for screening for cardiovascular disorders: Secondary | ICD-10-CM | POA: Diagnosis not present

## 2017-05-27 DIAGNOSIS — Z87891 Personal history of nicotine dependence: Secondary | ICD-10-CM | POA: Diagnosis not present

## 2017-05-27 NOTE — Progress Notes (Signed)
Cardiology Office Note   Date:  05/28/2017   ID:  Steven [name], Steven [name] Oct 19, 1945, MRN [id number]  PCP:  Shelda Pal, DO  Cardiologist:   Minus Breeding, MD    Chief Complaint  Patient presents with  . Coronary Artery Disease      History of Present Illness: Steven [name] is a 71 y.o. male who presents for follow up of CAD and coronary stent.  Since I last saw him we switched him to Crestor.  He has done well.  He works loading and driving trucks.  The patient denies any new symptoms such as chest discomfort, neck or arm discomfort. There has been no new shortness of breath, PND or orthopnea. There have been no reported palpitations, presyncope or syncope.  He has none of the symptoms that he had at the time of his stents.      Past Medical History:  Diagnosis Date  . Arthritis    Right Hip  . Atrial fibrillation ([place])   . Controlled diabetes mellitus type 2 with complications ([place]) 03/0/1314  . COPD (chronic obstructive pulmonary disease) ([place])   . Coronary artery disease    Lexiscan Myoview 7/14:  Inf scar with minimal reversibility, inf HK, EF 45%;  Low risk.  Promus LAD stent placed in [place].  100% RCA  . Diabetes mellitus without complication ([place])   . Dyslipidemia   . GERD (gastroesophageal reflux disease)    has used Zantac in the past   . Hernia   . Hyperlipidemia   . Hypertension   . Peripheral vascular disease ([place])    a. s/p R fem-pop BPG 01/2013 (Dr. [name])  . 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 CARDIAC CATHETERIZATION     with stent placement  . COLONOSCOPY  March 2014  . INGUINAL HERNIA REPAIR  2010   Right     Current Outpatient Medications  Medication Sig Dispense Refill  . aspirin EC 81 MG tablet Take 81 mg by mouth daily.    . Blood Glucose Monitoring Suppl (ONETOUCH VERIO IQ SYSTEM) w/Device KIT Use to check blood sugar 2 times  per day. 1 kit 2  . cetirizine (ZYRTEC) 10 MG tablet Take 1 tablet (10 mg total) by mouth daily. 30 tablet 11  . ezetimibe (ZETIA) 10 MG tablet Take 1 tablet (10 mg total) by mouth daily. 90 tablet 3  . glucose blood (ONETOUCH VERIO) test strip Use to check blood sugar 2 times per day 200 each 4  . Insulin Glargine (TOUJEO SOLOSTAR) 300 UNIT/ML SOPN Inject 25 Units into the skin daily. 1.5 mL 11  . Insulin Pen Needle (BD PEN NEEDLE NANO U/F) 32G X 4 MM MISC use as directed with INSULIN 100 each 4  . losartan (COZAAR) 100 MG tablet TAKE 1 TABLET(100 MG) BY MOUTH DAILY BEFORE BREAKFAST 90 tablet 3  . metFORMIN (GLUCOPHAGE) 1000 MG tablet TAKE 1 TABLET(1000 MG) BY MOUTH TWICE DAILY WITH A MEAL 180 tablet 3  . metoprolol succinate (TOPROL-XL) 100 MG 24 hr tablet Take 1 tablet (100 mg total) daily by mouth. Take with or immediately following a meal. 90 tablet 3  . mometasone (NASONEX) 50 MCG/ACT nasal spray Place 2 sprays into the nose daily. 17 g 3  . ONETOUCH DELICA LANCETS 38O MISC Use to check blood sugar 2 times per day. 200 each 2  .  rosuvastatin (CRESTOR) 40 MG tablet Take 1 tablet (40 mg total) by mouth daily. 30 tablet 11  . traMADol (ULTRAM) 50 MG tablet Take 1 tablet (50 mg total) by mouth every 8 (eight) hours as needed. 65 tablet 2   No current facility-administered medications for this visit.     Allergies:   Patient has no known allergies.    ROS:  Please see the history of present illness.   Otherwise, review of systems are positive for none.   All other systems are reviewed and negative.    PHYSICAL EXAM: VS:  BP (!) 180/80   Pulse 92   Ht '5\' 5"'$  (1.651 m)   Wt 165 lb (74.8 kg)   BMI 27.46 kg/m  , BMI Body mass index is 27.46 kg/m. GENERAL:  Well appearing NECK:  No jugular venous distention, waveform within normal limits, carotid upstroke brisk and symmetric, mid abdominal bruit, no thyromegaly LUNGS:  Clear to auscultation bilaterally CHEST:  Unremarkable HEART:  PMI  not displaced or sustained,S1 and S2 within normal limits, no S3, no S4, no clicks, no rubs, no murmurs ABD:  Flat, positive bowel sounds normal in frequency in pitch, no bruits, no rebound, no guarding, no midline pulsatile mass, no hepatomegaly, no splenomegaly EXT:  2 plus pulses throughout, no edema, no cyanosis no clubbing   EKG:  EKG is ordered today. The ekg ordered today demonstrates sinus rhythm, rate 92, axis within normal limits, intervals within normal limits, old inferior infarct, nonspecific inferior lateral ST depression.   Recent Labs: 05/24/2017: ALT 24; BUN 12; Creatinine, Ser 1.42; Hemoglobin 13.3; Platelets 147.0; Potassium 4.2; Sodium 140    Lipid Panel    Component Value Date/Time   CHOL 156 05/24/2017 0817   TRIG 104.0 05/24/2017 0817   HDL 67.20 05/24/2017 0817   CHOLHDL 2 05/24/2017 0817   VLDL 20.8 05/24/2017 0817   LDLCALC 68 05/24/2017 0817   Lab Results  Component Value Date   HGBA1C 7.6 (H) 05/24/2017      Wt Readings from Last 3 Encounters:  05/28/17 165 lb (74.8 kg)  05/24/17 145 lb (65.8 kg)  05/14/17 146 lb (66.2 kg)      Other studies Reviewed: Additional studies/ records that were reviewed today include: Labs. Review of the above records demonstrates:  Please see elsewhere in the note.     ASSESSMENT AND PLAN:    CAD  Since his negative perfusion study in 2014 .  No he will continue with risk reduction.further testing is indicated.    Dyslipidemia His LDL was at target on Crestor.  No change in therapy is indicated. a repeat lipid level in 8 weeks.    HTN The blood pressure is  elevated.  I will increase his metoprolol to 100 mg daily.  CKD I do see creatinine was 1.42.  No change in therapy is indicated.   DM His hemoglobin A1c was 7.6.  He understands the need for good BP control and we talked about target.  I will defer considieration of Jardiance for the cardiovascular benefit to Shelda Pal,  DO  Current medicines are reviewed at length with the patient today.  The patient does not have concerns regarding medicines.  The following changes have been made:  no change  Labs/ tests ordered today include: None  Orders Placed This Encounter  Procedures  . EKG 12-Lead     Disposition:   FU with me in one year.     Signed, Minus Breeding, MD  05/28/2017 8:25 AM    [facility]

## 2017-05-28 ENCOUNTER — Ambulatory Visit (INDEPENDENT_AMBULATORY_CARE_PROVIDER_SITE_OTHER): Payer: Medicare Other | Admitting: Cardiology

## 2017-05-28 ENCOUNTER — Encounter: Payer: Self-pay | Admitting: Cardiology

## 2017-05-28 VITALS — BP 180/80 | HR 92 | Ht 65.0 in | Wt 165.0 lb

## 2017-05-28 DIAGNOSIS — E785 Hyperlipidemia, unspecified: Secondary | ICD-10-CM | POA: Diagnosis not present

## 2017-05-28 DIAGNOSIS — I2510 Atherosclerotic heart disease of native coronary artery without angina pectoris: Secondary | ICD-10-CM | POA: Diagnosis not present

## 2017-05-28 DIAGNOSIS — Z794 Long term (current) use of insulin: Secondary | ICD-10-CM

## 2017-05-28 DIAGNOSIS — I70212 Atherosclerosis of native arteries of extremities with intermittent claudication, left leg: Secondary | ICD-10-CM | POA: Diagnosis not present

## 2017-05-28 DIAGNOSIS — E119 Type 2 diabetes mellitus without complications: Secondary | ICD-10-CM | POA: Insufficient documentation

## 2017-05-28 DIAGNOSIS — E118 Type 2 diabetes mellitus with unspecified complications: Secondary | ICD-10-CM | POA: Insufficient documentation

## 2017-05-28 DIAGNOSIS — I10 Essential (primary) hypertension: Secondary | ICD-10-CM | POA: Diagnosis not present

## 2017-05-28 MED ORDER — METOPROLOL SUCCINATE ER 100 MG PO TB24: 100.0000 mg | ORAL_TABLET | Freq: Every day | ORAL | 3 refills | Status: DC

## 2017-05-28 NOTE — Patient Instructions (Signed)
Medication Instructions:  INCREASE- Metoprolol 100 mg daily  If you need a refill on your cardiac medications before your next appointment, please call your pharmacy.  Labwork: None Ordered   Testing/Procedures: None Ordered  Follow-Up: Your physician wants you to follow-up in: 1 Year. You should receive a reminder letter in the mail two months in advance. If you do not receive a letter, please call our office [phone]. to schedule the 1 Year. follow-up appointment.   Thank you for choosing [facility] at [facility]!!

## 2017-05-29 NOTE — Addendum Note (Signed)
Addended by: Lianne Cure A on: 05/29/2017 03:15 PM   Modules accepted: Orders

## 2017-05-30 ENCOUNTER — Telehealth: Payer: Self-pay | Admitting: Cardiology

## 2017-05-30 NOTE — Telephone Encounter (Signed)
Pt says he wants his Metoprolol decrease back to 50 mg please.Please call the 50 mg to the pharmacy today please.

## 2017-05-30 NOTE — Telephone Encounter (Signed)
S/w pt and he states that he got home after the appointment and realized he has actually not been taking the metoprolol 50 mg as discussed at visit and instead of starting the metoprolol 100 mg, he wanted to see if he could just start metoprolol 50 mg and try this out first. I advised pt that this would be routed to the proper staff so that this could be addressed tomorrow. Pt verbalized understanding and agreed to call back with additional questions or concerns pertaining to this.

## 2017-05-31 NOTE — Telephone Encounter (Signed)
OK to start 50 mg metoprolol.

## 2017-06-01 NOTE — Telephone Encounter (Signed)
Leave detailed message on pt own voicemail to take 50 mg of metoprolol.

## 2017-06-05 ENCOUNTER — Encounter: Payer: Self-pay | Admitting: Podiatry

## 2017-06-05 ENCOUNTER — Ambulatory Visit (INDEPENDENT_AMBULATORY_CARE_PROVIDER_SITE_OTHER): Payer: Medicare Other | Admitting: Podiatry

## 2017-06-05 DIAGNOSIS — E1151 Type 2 diabetes mellitus with diabetic peripheral angiopathy without gangrene: Secondary | ICD-10-CM

## 2017-06-05 DIAGNOSIS — B351 Tinea unguium: Secondary | ICD-10-CM

## 2017-06-05 DIAGNOSIS — Q828 Other specified congenital malformations of skin: Secondary | ICD-10-CM | POA: Diagnosis not present

## 2017-06-05 DIAGNOSIS — M79674 Pain in right toe(s): Secondary | ICD-10-CM | POA: Diagnosis not present

## 2017-06-05 DIAGNOSIS — M79675 Pain in left toe(s): Secondary | ICD-10-CM

## 2017-06-05 NOTE — Progress Notes (Addendum)
Subjective: 71 y.o. returns the office today for painful, elongated, thickened toenails which he cannot trim himself and for painful calluses to the left foot, along the medial arch and 5th toe. Denies any redness or drainage or any swelling from the nails or callus sites.  Denies any acute changes since last appointment and no new complaints today. Denies any systemic complaints such as fevers, chills, nausea, vomiting.   Objective: AAO 3, NAD DP/PT pulses decreased Protective sensation decreased with Simms Weinstein monofilament Nails hypertrophic, dystrophic, elongated, brittle, discolored 10. There is tenderness overlying the nails 1-5 bilaterally. There is no surrounding erythema or drainage along the nail sites. Hyperkeratotic lesion to the dorsolateral aspect the bilateral fifth toe. Upon debridement no underlying ulceration, drainage or any signs of infection. Hammertoes present.  No open lesions or pre-ulcerative lesions are identified. No other areas of tenderness bilateral lower extremities. No overlying edema, erythema, increased warmth. No pain with calf compression, swelling, warmth, erythema.  Assessment: Patient presents with symptomatic onychomycosis; hyperkeratotic lesions, PVD  Plan: -Treatment options including alternatives, risks, complications were discussed -Nails sharply debrided 10 without complication/bleeding. -Hyperkeratotic lesions were debrided 2 without complications or bleeding. -Discussed daily foot inspection. If there are any changes, to call the office immediately.  -Follow-up in 3 months or sooner if any problems are to arise. In the meantime, encouraged to call the office with any questions, concerns, changes symptoms.  Celesta Gentile, DPM  *Addendum: Patient is requesting diabetic shoes.  Given calluses to bilateral fifth digits, hammertoes, neuropathy with diabetes I do think that he would benefit from diabetic shoes.  Paperwork was needed today  for recertification of this.  Trula Slade DPM

## 2017-06-13 ENCOUNTER — Telehealth (INDEPENDENT_AMBULATORY_CARE_PROVIDER_SITE_OTHER): Payer: Medicare Other | Admitting: Family Medicine

## 2017-06-13 ENCOUNTER — Telehealth: Payer: Self-pay | Admitting: Podiatry

## 2017-06-13 DIAGNOSIS — E119 Type 2 diabetes mellitus without complications: Secondary | ICD-10-CM | POA: Diagnosis not present

## 2017-06-13 NOTE — Telephone Encounter (Signed)
Dr Nani Ravens-- I pended an order for diabetic shoes if appropriate or do you want to give pt a hand written Rx? The order I pended would have to be printed and signed and then we could fax to company below.  Please advise?

## 2017-06-13 NOTE — Telephone Encounter (Signed)
That's fine to do. Ty.

## 2017-06-13 NOTE — Telephone Encounter (Signed)
Copied from [place] [id number]. Topic: Quick Communication - See Telephone Encounter >> Jun 13, 2017 11:05 AM Antonieta Iba C wrote: CRM for notification. See Telephone encounter for:  06/13/17.   Pt is requesting orders to have diabetic shoes. Pt says that he would like to pick up at [facility] on [address].   Please assist further.

## 2017-06-13 NOTE — Telephone Encounter (Signed)
Pt left voicemail for an appt for diabetic shoes.  I do not see where you have dictated that pt needs diabetic shoes. Does he qualify?

## 2017-06-13 NOTE — Telephone Encounter (Signed)
Called [employer] and was advised that they no longer make diabetic shoes. Notified pt. He will contact his podiatrist for further recommendations since they referred him to [facility] in the past.

## 2017-06-16 ENCOUNTER — Other Ambulatory Visit: Payer: Self-pay | Admitting: Cardiology

## 2017-06-19 ENCOUNTER — Telehealth: Payer: Self-pay | Admitting: Podiatry

## 2017-06-19 NOTE — Telephone Encounter (Signed)
Addendum done and I have the paperwork for you.

## 2017-06-19 NOTE — Telephone Encounter (Signed)
Left a message for pt to call to schedule an appt for diabetic shoe measurements.

## 2017-06-27 ENCOUNTER — Other Ambulatory Visit: Payer: Self-pay | Admitting: Family Medicine

## 2017-06-27 DIAGNOSIS — E119 Type 2 diabetes mellitus without complications: Secondary | ICD-10-CM

## 2017-06-27 NOTE — Telephone Encounter (Signed)
OK to refer. TY.

## 2017-06-27 NOTE — Telephone Encounter (Signed)
Copied from [place] [id number]. Topic: Referral - Request >> Jun 26, 2017  1:14 PM Synthia Innocent wrote: Reason for CRM: Needs referral from Dr Nani Ravens for Dr Jacqualyn Posey to obtain his DM Shoes.   >> Jun 27, 2017  9:26 AM Synthia Innocent wrote: Patient call again, please advise

## 2017-06-27 NOTE — Telephone Encounter (Signed)
Referral done

## 2017-06-28 ENCOUNTER — Other Ambulatory Visit: Payer: Self-pay

## 2017-07-11 ENCOUNTER — Telehealth: Payer: Self-pay | Admitting: Podiatry

## 2017-07-11 NOTE — Telephone Encounter (Signed)
Pt left voicemail regarding diabetic shoe referral.  I returned call and left a message for pt to call me to schedule an appt to get measured and pick out his shoes.

## 2017-07-19 ENCOUNTER — Telehealth: Payer: Self-pay | Admitting: Family Medicine

## 2017-07-19 MED ORDER — ROSUVASTATIN CALCIUM 40 MG PO TABS: 40.0000 mg | ORAL_TABLET | Freq: Every day | ORAL | 11 refills | Status: DC

## 2017-07-19 NOTE — Telephone Encounter (Signed)
Advise if ok to fill

## 2017-07-19 NOTE — Telephone Encounter (Signed)
Copied from [place] [phone]. Topic: Quick Communication - See Telephone Encounter >> Jul 19, 2017 12:41 PM Cleaster Corin, NT wrote: CRM for notification. See Telephone encounter for:   07/19/17. Pt. Called to see if he can get a refill on med. Crestor one Doctor took him off med. His heart doctor put him back on med. Pt. Needs med. Called pharmacy and they didn't have an rx. For med. Pt. Can be reached at [facility] [place] - [address] [place] [address] Phone: [phone] Fax: [phone]

## 2017-07-19 NOTE — Addendum Note (Signed)
Addended by: Sharon Seller B on: 07/19/2017 01:19 PM   Modules accepted: Orders

## 2017-07-19 NOTE — Telephone Encounter (Signed)
OK to refill Crestor. TY.

## 2017-07-25 ENCOUNTER — Ambulatory Visit: Payer: Medicare Other

## 2017-07-30 ENCOUNTER — Telehealth: Payer: Self-pay | Admitting: Family Medicine

## 2017-07-30 ENCOUNTER — Ambulatory Visit: Payer: Medicare Other | Admitting: Podiatry

## 2017-07-30 MED ORDER — AMLODIPINE BESYLATE 5 MG PO TABS: 5.0000 mg | ORAL_TABLET | Freq: Every day | ORAL | 4 refills | Status: DC

## 2017-07-30 NOTE — Telephone Encounter (Signed)
Amlodipine 5 mg/d, please call in. TY.

## 2017-07-30 NOTE — Addendum Note (Signed)
Addended by: Sharon Seller B on: 07/30/2017 05:02 PM   Modules accepted: Orders

## 2017-07-30 NOTE — Telephone Encounter (Signed)
Pt is requesting a med in place of Losartan. Please advise.

## 2017-07-30 NOTE — Telephone Encounter (Signed)
Please call in Amlodipine to replace losartan, 5mg /d. TY.

## 2017-07-30 NOTE — Telephone Encounter (Signed)
Sent in amlodipine

## 2017-07-30 NOTE — Telephone Encounter (Signed)
Copied from [place] [id number]. Topic: Quick Communication - See Telephone Encounter >> Jul 30, 2017  3:09 PM Vernona Rieger wrote: CRM for notification. See Telephone encounter for:   07/30/17.  Pt said he wants to know if his med LOSARTAN be changed to something else due to it causing cancer.  Please let patient know [phone]

## 2017-08-14 ENCOUNTER — Other Ambulatory Visit: Payer: Medicare Other | Admitting: Orthotics

## 2017-08-26 ENCOUNTER — Other Ambulatory Visit: Payer: Self-pay | Admitting: Cardiology

## 2017-08-26 DIAGNOSIS — I10 Essential (primary) hypertension: Secondary | ICD-10-CM

## 2017-08-27 NOTE — Telephone Encounter (Signed)
Rx request sent to pharmacy.  

## 2017-08-29 ENCOUNTER — Telehealth: Payer: Self-pay | Admitting: Family Medicine

## 2017-08-29 NOTE — Telephone Encounter (Signed)
Attempted to call patient to schedule AWV. Patient did not answer, could not leave voicemail. Will try to call patient at a later time. SF

## 2017-09-07 ENCOUNTER — Telehealth: Payer: Self-pay | Admitting: Podiatry

## 2017-09-07 NOTE — Telephone Encounter (Signed)
Left message for pt that his diabetic shoes are not in we have not received the signed paperwork from his doctor. I hand faxed it today. And we need to cxl appt for 2.19.19 and I can call him to r/s when they come in. But to please call to confirm.

## 2017-09-11 ENCOUNTER — Other Ambulatory Visit: Payer: Medicare Other | Admitting: Orthotics

## 2017-09-14 ENCOUNTER — Telehealth: Payer: Self-pay | Admitting: *Deleted

## 2017-09-14 NOTE — Telephone Encounter (Signed)
Received Physician Orders from [place]; forwarded to provider with OV notes to provide documentation to Medicare/SLS

## 2017-10-08 ENCOUNTER — Ambulatory Visit (INDEPENDENT_AMBULATORY_CARE_PROVIDER_SITE_OTHER): Payer: Medicare Other | Admitting: Orthotics

## 2017-10-08 DIAGNOSIS — I739 Peripheral vascular disease, unspecified: Secondary | ICD-10-CM

## 2017-10-08 DIAGNOSIS — E1151 Type 2 diabetes mellitus with diabetic peripheral angiopathy without gangrene: Secondary | ICD-10-CM

## 2017-10-08 DIAGNOSIS — G629 Polyneuropathy, unspecified: Secondary | ICD-10-CM | POA: Diagnosis not present

## 2017-10-08 NOTE — Progress Notes (Signed)

## 2017-10-15 ENCOUNTER — Encounter: Payer: Self-pay | Admitting: Internal Medicine

## 2017-10-23 DIAGNOSIS — E1142 Type 2 diabetes mellitus with diabetic polyneuropathy: Secondary | ICD-10-CM | POA: Diagnosis not present

## 2017-10-23 DIAGNOSIS — I739 Peripheral vascular disease, unspecified: Secondary | ICD-10-CM | POA: Diagnosis not present

## 2017-10-23 DIAGNOSIS — L603 Nail dystrophy: Secondary | ICD-10-CM | POA: Diagnosis not present

## 2017-10-29 ENCOUNTER — Other Ambulatory Visit: Payer: Self-pay | Admitting: Internal Medicine

## 2017-11-08 ENCOUNTER — Telehealth: Payer: Self-pay | Admitting: Family Medicine

## 2017-11-08 NOTE — Telephone Encounter (Signed)
Copied from [place] [phone]. Topic: Referral - Request >> Nov 08, 2017  2:20 PM Yvette Rack wrote: Reason for CRM: patient calling stating that he need a referral to have a COLONOSCOPY that past due he states that he has received a letter in the mail that it was time for him to get one

## 2017-11-08 NOTE — Telephone Encounter (Signed)
OK 

## 2017-11-12 ENCOUNTER — Telehealth: Payer: Self-pay | Admitting: Family Medicine

## 2017-11-12 ENCOUNTER — Encounter: Payer: Self-pay | Admitting: Internal Medicine

## 2017-11-12 NOTE — Telephone Encounter (Signed)
Patient's wife dropping off paperwork to be filled out by Dr. Nani Ravens- Hx of medical/diabetes for insurance/work. Steven [name] will pick up once ready- [phone]. Ppwrk placed in PCP tray up front.

## 2017-11-13 NOTE — Telephone Encounter (Signed)
Forwarded paperwork to provider/SLS 04/23

## 2017-11-14 ENCOUNTER — Encounter: Payer: Self-pay | Admitting: Family Medicine

## 2017-11-15 NOTE — Telephone Encounter (Signed)
Called spouse as directed to inform that paperwork is ready for pick-up during regular business hours; understood & agreed/SLS 04/25

## 2017-11-21 NOTE — Progress Notes (Signed)
Subjective:   Steven [name] is a 72 y.o. male who presents for Medicare Annual/Subsequent preventive examination. Still works 40hrs per week.  Review of Systems: No ROS.  Medicare Wellness Visit. Additional risk factors are reflected in the social history.  Cardiac Risk Factors include: advanced age (>50mn, >>58women);diabetes mellitus;hypertension;male gender Sleep patterns: Sleep about 8 hrs. Feels rested. Home Safety/Smoke Alarms: Feels safe in home. Smoke alarms in place.  Living environment; residence and Firearm Safety: Lives with mother and wife in 1 story home. Walk in shower    Male:   CCS- scheduled with Dr.Pyrtle 12/19/17    PSA-  Lab Results  Component Value Date   PSA 3.82 05/24/2017   PSA 3.12 08/22/2016   PSA 4.68 (H) 02/07/2016      Objective:    Vitals: BP (!) 152/80 (BP Location: Left Arm, Patient Position: Sitting, Cuff Size: Normal) Comment: MD made aware. No new orders  Pulse 60   Ht '5\' 5"'  (1.651 m)   Wt 143 lb 9.6 oz (65.1 kg)   SpO2 98%   BMI 23.90 kg/m   Body mass index is 23.9 kg/m.  Advanced Directives 11/26/2017 05/14/2017 09/06/2016 05/10/2016 02/08/2016 08/26/2015 01/11/2015  Does Patient Have a Medical Advance Directive? Yes Yes Yes - Yes No No  Type of Advance Directive [place] will Living will [place] will Living will;Healthcare Power of Attorney - -  Does patient want to make changes to medical advance directive? No - Patient declined - - - No - Patient declined - -  Copy of [place]? No - copy requested - - - No - copy requested - -  Would patient like information on creating a medical advance directive? - - - - - No - patient declined information No - patient declined information  Pre-existing out of facility DNR order (yellow form or pink MOST form) - - - - - - -    Tobacco Social History   Tobacco Use  Smoking  Status Former Smoker  . Packs/day: 0.30  . Years: 50.00  . Pack years: 15.00  . Types: Cigarettes  . Last attempt to quit: 02/04/2011  . Years since quitting: 6.8  Smokeless Tobacco Never Used     Counseling given: Not Answered   Clinical Intake: Pain : No/denies pain   Past Medical History:  Diagnosis Date  . Arthritis    Right Hip  . Atrial fibrillation ([place]   . COPD (chronic obstructive pulmonary disease) ([place]   . Coronary artery disease    Lexiscan Myoview 7/14:  Inf scar with minimal reversibility, inf HK, EF 45%;  Low risk.  Promus LAD [place]  100% RCA  . Diabetes mellitus without complication ([place]   . Dyslipidemia   . GERD (gastroesophageal reflux disease)    has used Zantac in the past   . Hernia   . Hypertension   . Peripheral vascular disease ([place]    a. s/p R fem-pop BPG 01/2013 (Dr. FOneida Alar  . 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 ABDOMINAL AORTAGRAM N/A 12/10/2012   Procedure: ABDOMINAL AMaxcine Ham  Surgeon: VSerafina Mitchell MD;  [facility];  Service: Cardiovascular;  Laterality: N/A;  . CARDIAC CATHETERIZATION     with stent placement  . COLONOSCOPY  March 2014  .  FEMORAL-POPLITEAL BYPASS GRAFT Right 02/07/2013   Procedure: BYPASS GRAFT FEMORAL-POPLITEAL ARTERY- RIGHT;  Surgeon: Mal Misty, MD;  Location[facility];  Service: Vascular;  Laterality: Right;  Right femoral to below knee popliteal bypass using right non reversed great saphenous vein  . INGUINAL HERNIA REPAIR  2010   Right  . INTRAOPERATIVE ARTERIOGRAM Right 02/07/2013   Procedure: Attempted INTRA OPERATIVE ARTERIOGRAM;  Surgeon: Mal Misty, MD;  Location: [facility] OR;  Service: Vascular;  Laterality: Right;   Family History  Problem Relation Age of Onset  . Diabetes Mother   . Hypertension Mother   . Heart disease Mother   . Hyperlipidemia Mother   . Diabetes Father     . Heart disease Father   . Hyperlipidemia Father   . Hypertension Father   . Diabetes Sister   . Heart disease Sister   . Hyperlipidemia Sister   . Hypertension Sister   . Diabetes Brother   . Heart disease Brother   . Hyperlipidemia Brother   . Hypertension Brother   . Diabetes Son   . Heart disease Son   . Hyperlipidemia Son   . Hypertension Son   . Alcohol abuse Neg Hx   . Cancer Neg Hx   . Depression Neg Hx   . Early death Neg Hx   . Kidney disease Neg Hx   . Stroke Neg Hx   . Colon cancer Neg Hx   . Esophageal cancer Neg Hx   . Rectal cancer Neg Hx   . Stomach cancer Neg Hx    Social History   Socioeconomic History  . Marital status: Married    Spouse name: Not on file  . Number of children: 2  . Years of education: Not on file  . Highest education level: Not on file  Occupational History  . Occupation: retired  Scientific laboratory technician  . Financial resource strain: Not on file  . Food insecurity:    Worry: Not on file    Inability: Not on file  . Transportation needs:    Medical: Not on file    Non-medical: Not on file  Tobacco Use  . Smoking status: Former Smoker    Packs/day: 0.30    Years: 50.00    Pack years: 15.00    Types: Cigarettes    Last attempt to quit: 02/04/2011    Years since quitting: 6.8  . Smokeless tobacco: Never Used  Substance and Sexual Activity  . Alcohol use: Yes    Alcohol/week: 1.8 oz    Types: 3 Cans of beer per week    Comment: everyday, beer 40 ozs & alcohol- 1-2 shots   . Drug use: No  . Sexual activity: Yes  Lifestyle  . Physical activity:    Days per week: Not on file    Minutes per session: Not on file  . Stress: Not on file  Relationships  . Social connections:    Talks on phone: Not on file    Gets together: Not on file    Attends religious service: Not on file    Active member of club or organization: Not on file    Attends meetings of clubs or organizations: Not on file    Relationship status: Not on file  Other  Topics Concern  . Not on file  Social History Narrative   Five children living.  Lives with wife.      Outpatient Encounter Medications as of 11/26/2017  Medication Sig  . amLODipine (NORVASC) 5  MG tablet Take 1 tablet (5 mg total) by mouth daily.  Marland Kitchen aspirin EC 81 MG tablet Take 81 mg by mouth daily.  . Blood Glucose Monitoring Suppl (ONETOUCH VERIO IQ SYSTEM) w/Device KIT Use to check blood sugar 2 times per day.  . cetirizine (ZYRTEC) 10 MG tablet TAKE 1 TABLET BY MOUTH DAILY  . glucose blood (ONETOUCH VERIO) test strip Use to check blood sugar 2 times per day  . Insulin Glargine (TOUJEO SOLOSTAR) 300 UNIT/ML SOPN Inject 25 Units into the skin daily.  . Insulin Pen Needle (BD PEN NEEDLE NANO U/F) 32G X 4 MM MISC use as directed with INSULIN  . losartan (COZAAR) 100 MG tablet TAKE 1 TABLET BY MOUTH DAILY BEFORE BREAKFAST  . metFORMIN (GLUCOPHAGE) 1000 MG tablet TAKE 1 TABLET(1000 MG) BY MOUTH TWICE DAILY WITH A MEAL  . metoprolol succinate (TOPROL-XL) 100 MG 24 hr tablet Take 1 tablet (100 mg total) daily by mouth. Take with or immediately following a meal.  . mometasone (NASONEX) 50 MCG/ACT nasal spray Place 2 sprays into the nose daily.  Glory Rosebush DELICA LANCETS 79Y MISC Use to check blood sugar 2 times per day.  . rosuvastatin (CRESTOR) 40 MG tablet Take 1 tablet (40 mg total) by mouth daily.  . tamsulosin (FLOMAX) 0.4 MG CAPS capsule Take 1 capsule (0.4 mg total) by mouth daily.  . traMADol (ULTRAM) 50 MG tablet Take 1 tablet (50 mg total) by mouth every 8 (eight) hours as needed.  . [DISCONTINUED] rosuvastatin (CRESTOR) 40 MG tablet TAKE 1 TABLET BY MOUTH DAILY  . ezetimibe (ZETIA) 10 MG tablet Take 1 tablet (10 mg total) by mouth daily. (Patient not taking: Reported on 11/26/2017)   No facility-administered encounter medications on file as of 11/26/2017.     Activities of Daily Living In your present state of health, do you have any difficulty performing the following activities:  11/26/2017  Hearing? N  Vision? N  Comment wears glasses.   Difficulty concentrating or making decisions? N  Walking or climbing stairs? N  Dressing or bathing? N  Doing errands, shopping? N  Preparing Food and eating ? N  Using the Toilet? N  In the past six months, have you accidently leaked urine? N  Do you have problems with loss of bowel control? N  Managing your Medications? N  Managing your Finances? N  Housekeeping or managing your Housekeeping? N  Some recent data might be hidden    Patient Care Team: Shelda Pal, DO as PCP - General (Family Medicine) Minus Breeding, MD as Attending Physician (Cardiology)   Assessment:   This is a routine wellness examination for Agustus. Physical assessment deferred to PCP.  Exercise Activities and Dietary recommendations Current Exercise Habits: The patient does not participate in regular exercise at present, Exercise limited by: None identified Diet (meal preparation, eat out, water intake, caffeinated beverages, dairy products, fruits and vegetables): in general, a "healthy" diet  , well balanced   Goals    . maintain current health       Fall Risk Fall Risk  11/26/2017 06/28/2017 02/08/2016 12/29/2014 12/29/2014  Falls in the past year? No No No No No  Comment - Emmi Telephone Survey: data to providers prior to load - - -     Depression Screen PHQ 2/9 Scores 11/26/2017 02/08/2016 12/29/2014 12/29/2014  PHQ - 2 Score 0 0 0 0    Cognitive Function Ad8 score reviewed for issues:  Issues making decisions:no  Less interest in  hobbies / activities:no  Repeats questions, stories (family complaining):no  Trouble using ordinary gadgets (microwave, computer, phone):no  Forgets the month or year:no   Mismanaging finances: no  Remembering appts:no  Daily problems with thinking and/or memory:no Ad8 score is=0        Immunization History  Administered Date(s) Administered  . Influenza Whole 04/23/2012  . Influenza,  High Dose Seasonal PF 05/22/2013, 06/14/2016, 05/24/2017  . Influenza,inj,Quad PF,6+ Mos 07/13/2015  . Pneumococcal Conjugate-13 12/29/2014  . Pneumococcal Polysaccharide-23 10/11/2012  . Tdap 05/22/2013  . Zoster 12/29/2014   Screening Tests Health Maintenance  Topic Date Due  . FOOT EXAM  02/06/2017  . OPHTHALMOLOGY EXAM  02/28/2017  . COLONOSCOPY  10/17/2017  . HEMOGLOBIN A1C  11/21/2017  . PNA vac Low Risk Adult (2 of 2 - PPSV23) 11/27/2018 (Originally 10/11/2017)  . INFLUENZA VACCINE  02/21/2018  . TETANUS/TDAP  05/23/2023  . Hepatitis C Screening  Completed      Plan:   Follow up with PCP as directed  Eat heart healthy diet (full of fruits, vegetables, whole grains, lean protein, water--limit salt, fat, and sugar intake) and increase physical activity as tolerated.  Continue doing brain stimulating activities (puzzles, reading, adult coloring books, staying active) to keep memory sharp.   I have personally reviewed and noted the following in the patient's chart:   . Medical and social history . Use of alcohol, tobacco or illicit drugs  . Current medications and supplements . Functional ability and status . Nutritional status . Physical activity . Advanced directives . List of other physicians . Hospitalizations, surgeries, and ER visits in previous 12 months . Vitals . Screenings to include cognitive, depression, and falls . Referrals and appointments  In addition, I have reviewed and discussed with patient certain preventive protocols, quality metrics, and best practice recommendations. A written personalized care plan for preventive services as well as general preventive health recommendations were provided to patient.     Shela Nevin, [place]  11/26/2017

## 2017-11-22 ENCOUNTER — Other Ambulatory Visit: Payer: Self-pay | Admitting: Internal Medicine

## 2017-11-23 ENCOUNTER — Telehealth: Payer: Self-pay | Admitting: Family Medicine

## 2017-11-23 MED ORDER — TAMSULOSIN HCL 0.4 MG PO CAPS: 0.4000 mg | ORAL_CAPSULE | Freq: Every day | ORAL | 3 refills | Status: DC

## 2017-11-23 NOTE — Telephone Encounter (Signed)
Sent. TY

## 2017-11-23 NOTE — Telephone Encounter (Signed)
Called pt to confirm his AWV and he requested for me to put in a request for a medication refill. Tamsulosin 0.4 mg. Pt stated that the refill request was sent to Dr. Ronnald Ramp, who he is no longer seeing. Pt stated that he is out of this medication and would like for it to be rushed.   Please Advise

## 2017-11-26 ENCOUNTER — Ambulatory Visit (INDEPENDENT_AMBULATORY_CARE_PROVIDER_SITE_OTHER): Payer: Medicare Other | Admitting: *Deleted

## 2017-11-26 ENCOUNTER — Encounter: Payer: Self-pay | Admitting: *Deleted

## 2017-11-26 VITALS — BP 152/80 | HR 60 | Ht 65.0 in | Wt 143.6 lb

## 2017-11-26 DIAGNOSIS — Z0000 Encounter for general adult medical examination without abnormal findings: Secondary | ICD-10-CM | POA: Diagnosis not present

## 2017-11-26 NOTE — Patient Instructions (Signed)
Eat heart healthy diet (full of fruits, vegetables, whole grains, lean protein, water--limit salt, fat, and sugar intake) and increase physical activity as tolerated.  Continue doing brain stimulating activities (puzzles, reading, adult coloring books, staying active) to keep memory sharp.    [name] , Thank you for taking time to come for your Medicare Wellness Visit. I appreciate your ongoing commitment to your health goals. Please review the following plan we discussed and let me know if I can assist you in the future.   These are the goals we discussed: Goals    . maintain current health       This is a list of the screening recommended for you and due dates:  Health Maintenance  Topic Date Due  . Complete foot exam   02/06/2017  . Eye exam for diabetics  02/28/2017  . Colon Cancer Screening  10/17/2017  . Hemoglobin A1C  11/21/2017  . Pneumonia vaccines (2 of 2 - PPSV23) 11/27/2018*  . Flu Shot  02/21/2018  . Tetanus Vaccine  05/23/2023  .  Hepatitis C: One time screening is recommended by Center for Disease Control  (CDC) for  adults born from [birth year] through 1963.   Completed  *Topic was postponed. The date shown is not the original due date.    Health Maintenance, Male A healthy lifestyle and preventive care is important for your health and wellness. Ask your health care provider about what schedule of regular examinations is right for you. What should I know about weight and diet? Eat a Healthy Diet  Eat plenty of vegetables, fruits, whole grains, low-fat dairy products, and lean protein.  Do not eat a lot of foods high in solid fats, added sugars, or salt.  Maintain a Healthy Weight Regular exercise can help you achieve or maintain a healthy weight. You should:  Do at least 150 minutes of exercise each week. The exercise should increase your heart rate and make you sweat (moderate-intensity exercise).  Do strength-training exercises at least twice a week.  Watch  Your Levels of Cholesterol and Blood Lipids  Have your blood tested for lipids and cholesterol every 5 years starting at 70 years of age. If you are at high risk for heart disease, you should start having your blood tested when you are 70 years old. You may need to have your cholesterol levels checked more often if: ? Your lipid or cholesterol levels are high. ? You are older than 70 years of age. ? You are at high risk for heart disease.  What should I know about cancer screening? Many types of cancers can be detected early and may often be prevented. Lung Cancer  You should be screened every year for lung cancer if: ? You are a current smoker who has smoked for at least 30 years. ? You are a former smoker who has quit within the past 15 years.  Talk to your health care provider about your screening options, when you should start screening, and how often you should be screened.  Colorectal Cancer  Routine colorectal cancer screening usually begins at 70 years of age and should be repeated every 5-10 years until you are 70 years old. You may need to be screened more often if early forms of precancerous polyps or small growths are found. Your health care provider may recommend screening at an earlier age if you have risk factors for colon cancer.  Your health care provider may recommend using home test kits to check for  hidden blood in the stool.  A small camera at the end of a tube can be used to examine your colon (sigmoidoscopy or colonoscopy). This checks for the earliest forms of colorectal cancer.  Prostate and Testicular Cancer  Depending on your age and overall health, your health care provider may do certain tests to screen for prostate and testicular cancer.  Talk to your health care provider about any symptoms or concerns you have about testicular or prostate cancer.  Skin Cancer  Check your skin from head to toe regularly.  Tell your health care provider about any new  moles or changes in moles, especially if: ? There is a change in a mole's size, shape, or color. ? You have a mole that is larger than a pencil eraser.  Always use sunscreen. Apply sunscreen liberally and repeat throughout the day.  Protect yourself by wearing long sleeves, pants, a wide-brimmed hat, and sunglasses when outside.  What should I know about heart disease, diabetes, and high blood pressure?  If you are 74-16 years of age, have your blood pressure checked every 3-5 years. If you are 58 years of age or older, have your blood pressure checked every year. You should have your blood pressure measured twice-once when you are at a hospital or clinic, and once when you are not at a hospital or clinic. Record the average of the two measurements. To check your blood pressure when you are not at a hospital or clinic, you can use: ? An automated blood pressure machine at a pharmacy. ? A home blood pressure monitor.  Talk to your health care provider about your target blood pressure.  If you are between 13-70 years old, ask your health care provider if you should take aspirin to prevent heart disease.  Have regular diabetes screenings by checking your fasting blood sugar level. ? If you are at a normal weight and have a low risk for diabetes, have this test once every three years after the age of 38. ? If you are overweight and have a high risk for diabetes, consider being tested at a younger age or more often.  A one-time screening for abdominal aortic aneurysm (AAA) by ultrasound is recommended for men aged 35-75 years who are current or former smokers. What should I know about preventing infection? Hepatitis B If you have a higher risk for hepatitis B, you should be screened for this virus. Talk with your health care provider to find out if you are at risk for hepatitis B infection. Hepatitis C Blood testing is recommended for:  Everyone born from [birth year] through 1963.  Anyone with  known risk factors for hepatitis C.  Sexually Transmitted Diseases (STDs)  You should be screened each year for STDs including gonorrhea and chlamydia if: ? You are sexually active and are younger than 70 years of age. ? You are older than 70 years of age and your health care provider tells you that you are at risk for this type of infection. ? Your sexual activity has changed since you were last screened and you are at an increased risk for chlamydia or gonorrhea. Ask your health care provider if you are at risk.  Talk with your health care provider about whether you are at high risk of being infected with HIV. Your health care provider may recommend a prescription medicine to help prevent HIV infection.  What else can I do?  Schedule regular health, dental, and eye exams.  Stay current with your  vaccines (immunizations).  Do not use any tobacco products, such as cigarettes, chewing tobacco, and e-cigarettes. If you need help quitting, ask your health care provider.  Limit alcohol intake to no more than 2 drinks per day. One drink equals 12 ounces of beer, 5 ounces of wine, or 1 ounces of hard liquor.  Do not use street drugs.  Do not share needles.  Ask your health care provider for help if you need support or information about quitting drugs.  Tell your health care provider if you often feel depressed.  Tell your health care provider if you have ever been abused or do not feel safe at home. This information is not intended to replace advice given to you by your health care provider. Make sure you discuss any questions you have with your health care provider. Document Released: 01/06/2008 Document Revised: 03/08/2016 Document Reviewed: 04/13/2015 Elsevier Interactive Patient Education  [employer].

## 2017-11-27 NOTE — Progress Notes (Signed)
Noted. Agree with above.  [place], DO 11/27/17 8:28 AM

## 2017-12-04 ENCOUNTER — Other Ambulatory Visit: Payer: Self-pay

## 2017-12-04 ENCOUNTER — Ambulatory Visit (AMBULATORY_SURGERY_CENTER): Payer: Self-pay

## 2017-12-04 VITALS — Ht 65.0 in | Wt 145.8 lb

## 2017-12-04 DIAGNOSIS — Z86010 Personal history of colonic polyps: Secondary | ICD-10-CM

## 2017-12-04 MED ORDER — PEG 3350-KCL-NA BICARB-NACL 420 G PO SOLR: 4000.0000 mL | Freq: Once | ORAL | 0 refills | Status: AC

## 2017-12-04 NOTE — Progress Notes (Signed)
Denies allergies to eggs or soy products. Denies complication of anesthesia or sedation. Denies use of weight loss medication. Denies use of O2.   Emmi instructions declined.  

## 2017-12-19 ENCOUNTER — Encounter: Payer: Self-pay | Admitting: Internal Medicine

## 2017-12-19 ENCOUNTER — Ambulatory Visit (AMBULATORY_SURGERY_CENTER): Payer: Medicare Other | Admitting: Internal Medicine

## 2017-12-19 ENCOUNTER — Other Ambulatory Visit: Payer: Self-pay

## 2017-12-19 VITALS — BP 159/56 | HR 66 | Temp 98.9°F | Resp 19 | Ht 65.0 in | Wt 145.0 lb

## 2017-12-19 DIAGNOSIS — I4891 Unspecified atrial fibrillation: Secondary | ICD-10-CM | POA: Diagnosis not present

## 2017-12-19 DIAGNOSIS — I2510 Atherosclerotic heart disease of native coronary artery without angina pectoris: Secondary | ICD-10-CM | POA: Diagnosis not present

## 2017-12-19 DIAGNOSIS — D123 Benign neoplasm of transverse colon: Secondary | ICD-10-CM | POA: Diagnosis not present

## 2017-12-19 DIAGNOSIS — D125 Benign neoplasm of sigmoid colon: Secondary | ICD-10-CM

## 2017-12-19 DIAGNOSIS — I10 Essential (primary) hypertension: Secondary | ICD-10-CM | POA: Diagnosis not present

## 2017-12-19 DIAGNOSIS — I739 Peripheral vascular disease, unspecified: Secondary | ICD-10-CM | POA: Diagnosis not present

## 2017-12-19 DIAGNOSIS — E119 Type 2 diabetes mellitus without complications: Secondary | ICD-10-CM | POA: Diagnosis not present

## 2017-12-19 DIAGNOSIS — Z86010 Personal history of colonic polyps: Secondary | ICD-10-CM

## 2017-12-19 DIAGNOSIS — J449 Chronic obstructive pulmonary disease, unspecified: Secondary | ICD-10-CM | POA: Diagnosis not present

## 2017-12-19 MED ORDER — SODIUM CHLORIDE 0.9 % IV SOLN: 500.0000 mL | Freq: Once | INTRAVENOUS | Status: DC

## 2017-12-19 NOTE — Progress Notes (Signed)
Pt's states no medical or surgical changes since previsit or office visit. 

## 2017-12-19 NOTE — Progress Notes (Signed)
Called to room to assist during endoscopic procedure.  Patient ID and intended procedure confirmed with present staff. Received instructions for my participation in the procedure from the performing physician.  

## 2017-12-19 NOTE — Op Note (Signed)
[name] Procedure Date: 12/19/2017 3:47 PM MRN: [id number] Endoscopist: Jerene Bears , MD Age: 72 Referring MD:  Date of Birth: 1946/03/10 Gender: Male Account #: [id number] Procedure:                Colonoscopy Indications:              Surveillance: Personal history of adenomatous                            polyps on last colonoscopy 5 years ago Medicines:                Monitored Anesthesia Care Procedure:                Pre-Anesthesia Assessment:                           - Prior to the procedure, a History and Physical                            was performed, and patient medications and                            allergies were reviewed. The patient's tolerance of                            previous anesthesia was also reviewed. The risks                            and benefits of the procedure and the sedation                            options and risks were discussed with the patient.                            All questions were answered, and informed consent                            was obtained. Prior Anticoagulants: The patient has                            taken no previous anticoagulant or antiplatelet                            agents. ASA Grade Assessment: III - A patient with                            severe systemic disease. After reviewing the risks                            and benefits, the patient was deemed in                            satisfactory condition to undergo the procedure.  After obtaining informed consent, the colonoscope                            was passed under direct vision. Throughout the                            procedure, the patient's blood pressure, pulse, and                            oxygen saturations were monitored continuously. The                            Colonoscope was introduced through the anus and                            advanced to the cecum,  identified by appendiceal                            orifice and ileocecal valve. The colonoscopy was                            performed without difficulty. The patient tolerated                            the procedure well. The quality of the bowel                            preparation was good. The ileocecal valve,                            appendiceal orifice, and rectum were photographed. Scope In: 3:51:03 PM Scope Out: 4:05:42 PM Scope Withdrawal Time: 0 hours 8 minutes 26 seconds  Total Procedure Duration: 0 hours 14 minutes 39 seconds  Findings:                 The digital rectal exam was normal.                           A 3 mm polyp was found in the splenic flexure. The                            polyp was sessile. The polyp was removed with a                            cold snare. Resection and retrieval were complete.                           A 5 mm polyp was found in the sigmoid colon. The                            polyp was sessile. The polyp was removed with a  cold snare. Resection and retrieval were complete.                           Multiple small and large-mouthed diverticula were                            found in the sigmoid colon, descending colon and                            ascending colon.                           Internal hemorrhoids were found during                            retroflexion. The hemorrhoids were small. Complications:            No immediate complications. Estimated Blood Loss:     Estimated blood loss was minimal. Impression:               - One 3 mm polyp at the splenic flexure, removed                            with a cold snare. Resected and retrieved.                           - One 5 mm polyp in the sigmoid colon, removed with                            a cold snare. Resected and retrieved.                           - Moderate diverticulosis in the sigmoid colon, in                            the  descending colon and in the ascending colon.                           - Small internal hemorrhoids. Recommendation:           - Patient has a contact number available for                            emergencies. The signs and symptoms of potential                            delayed complications were discussed with the                            patient. Return to normal activities tomorrow.                            Written discharge instructions were provided to the  patient.                           - Resume previous diet.                           - Continue present medications.                           - Await pathology results.                           - Repeat colonoscopy is recommended for                            surveillance. The colonoscopy date will be                            determined after pathology results from today's                            exam become available for review. Jerene Bears, MD 12/19/2017 4:09:21 PM This report has been signed electronically.

## 2017-12-19 NOTE — Patient Instructions (Signed)
   INFORMATION ON POLYPS ,DIVERTICULOSIS,& HEMORRHOIDS GIVEN TO YOU TODAY.  AWAIT PATHOLOGY RESULTS ON POLYPS REMOVED     YOU HAD AN ENDOSCOPIC PROCEDURE TODAY AT THE [facility]:   Refer to the procedure report that was given to you for any specific questions about what was found during the examination.  If the procedure report does not answer your questions, please call your gastroenterologist to clarify.  If you requested that your care partner not be given the details of your procedure findings, then the procedure report has been included in a sealed envelope for you to review at your convenience later.  YOU SHOULD EXPECT: Some feelings of bloating in the abdomen. Passage of more gas than usual.  Walking can help get rid of the air that was put into your GI tract during the procedure and reduce the bloating. If you had a lower endoscopy (such as a colonoscopy or flexible sigmoidoscopy) you may notice spotting of blood in your stool or on the toilet paper. If you underwent a bowel prep for your procedure, you may not have a normal bowel movement for a few days.  Please Note:  You might notice some irritation and congestion in your nose or some drainage.  This is from the oxygen used during your procedure.  There is no need for concern and it should clear up in a day or so.  SYMPTOMS TO REPORT IMMEDIATELY:   Following lower endoscopy (colonoscopy or flexible sigmoidoscopy):  Excessive amounts of blood in the stool  Significant tenderness or worsening of abdominal pains  Swelling of the abdomen that is new, acute  Fever of 100F or higher    For urgent or emergent issues, a gastroenterologist can be reached at any hour by calling [phone].   DIET:  We do recommend a small meal at first, but then you may proceed to your regular diet.  Drink plenty of fluids but you should avoid alcoholic beverages for 24 hours.  ACTIVITY:  You should plan to take it easy for the rest  of today and you should NOT DRIVE or use heavy machinery until tomorrow (because of the sedation medicines used during the test).    FOLLOW UP: Our staff will call the number listed on your records the next business day following your procedure to check on you and address any questions or concerns that you may have regarding the information given to you following your procedure. If we do not reach you, we will leave a message.  However, if you are feeling well and you are not experiencing any problems, there is no need to return our call.  We will assume that you have returned to your regular daily activities without incident.  If any biopsies were taken you will be contacted by phone or by letter within the next 1-3 weeks.  Please call us at [phone] if you have not heard about the biopsies in 3 weeks.    SIGNATURES/CONFIDENTIALITY: You and/or your care partner have signed paperwork which will be entered into your electronic medical record.  These signatures attest to the fact that that the information above on your After Visit Summary has been reviewed and is understood.  Full responsibility of the confidentiality of this discharge information lies with you and/or your care-partner. 

## 2017-12-19 NOTE — Progress Notes (Signed)
Report to PACU, RN, vss, BBS= Clear.  

## 2017-12-20 ENCOUNTER — Telehealth: Payer: Self-pay

## 2017-12-20 NOTE — Telephone Encounter (Signed)
  Follow up Call-  Call back number 12/19/2017  Post procedure Call Back phone  # [phone]  Permission to leave phone message Yes  Some recent data might be hidden     Patient questions:  Do you have a fever, pain , or abdominal swelling? No. Pain Score  0 *  Have you tolerated food without any problems? Yes.    Have you been able to return to your normal activities? Yes.    Do you have any questions about your discharge instructions: Diet   No. Medications  No. Follow up visit  No.  Do you have questions or concerns about your Care? No.  Actions: * If pain score is 4 or above: No action needed, pain <4.

## 2017-12-23 ENCOUNTER — Encounter: Payer: Self-pay | Admitting: Internal Medicine

## 2018-02-04 ENCOUNTER — Other Ambulatory Visit: Payer: Self-pay | Admitting: Family Medicine

## 2018-02-04 MED ORDER — METFORMIN HCL 1000 MG PO TABS: ORAL_TABLET | ORAL | 0 refills | Status: DC

## 2018-02-05 ENCOUNTER — Other Ambulatory Visit: Payer: Self-pay

## 2018-02-05 MED ORDER — ROSUVASTATIN CALCIUM 40 MG PO TABS: 40.0000 mg | ORAL_TABLET | Freq: Every day | ORAL | 0 refills | Status: DC

## 2018-03-12 ENCOUNTER — Other Ambulatory Visit: Payer: Self-pay | Admitting: Family Medicine

## 2018-03-12 DIAGNOSIS — I10 Essential (primary) hypertension: Secondary | ICD-10-CM

## 2018-03-12 MED ORDER — LOSARTAN POTASSIUM 100 MG PO TABS: 100.0000 mg | ORAL_TABLET | Freq: Every day | ORAL | 1 refills | Status: DC

## 2018-03-12 NOTE — Telephone Encounter (Signed)
Copied from [place] [id number]. Topic: Quick Communication - Rx Refill/Question >> Mar 12, 2018  1:04 PM Burchel, Abbi R wrote: Medication:  losartan (COZAAR) 100 MG tablet    Preferred Pharmacy: [id number] [place], [address] [place] [address] Phone: [phone] Fax: [phone]

## 2018-03-12 NOTE — Telephone Encounter (Signed)
Rx refill request: losartan 100 mg     Outside provider   Last ordered: 08/27/17  LOV: 05/24/17  PCP: [place]: verified

## 2018-03-20 ENCOUNTER — Telehealth: Payer: Self-pay | Admitting: Family Medicine

## 2018-03-20 NOTE — Telephone Encounter (Signed)
Patient dropping off Handicap placard to be filled out. Please call patient when completed. Form placed in provider tray up front.

## 2018-03-21 NOTE — Telephone Encounter (Signed)
Called the patient informed of PCP instructions. Put the form at the front desk to pickup and take to his cardiologist. The patient agreed to do so. Form is at the front desk in the cabnet.

## 2018-03-21 NOTE — Telephone Encounter (Signed)
Looks like Dr. Percival Spanish fills this out? Have we done this? He doesn't have a problem we are managing that would warrant this.

## 2018-04-02 ENCOUNTER — Telehealth: Payer: Self-pay | Admitting: *Deleted

## 2018-04-02 NOTE — Telephone Encounter (Signed)
Called and leave a message for pt to call back to let him know Dr Percival Spanish will be unable to sign DMV handicap parking form. Pt works loading and driving trucks.

## 2018-04-04 ENCOUNTER — Telehealth: Payer: Self-pay | Admitting: Cardiology

## 2018-04-05 NOTE — Telephone Encounter (Signed)
Did not need this encounter °

## 2018-04-15 ENCOUNTER — Other Ambulatory Visit: Payer: Self-pay

## 2018-04-15 ENCOUNTER — Ambulatory Visit (INDEPENDENT_AMBULATORY_CARE_PROVIDER_SITE_OTHER): Payer: Medicare Other | Admitting: Family Medicine

## 2018-04-15 ENCOUNTER — Encounter: Payer: Self-pay | Admitting: Family Medicine

## 2018-04-15 VITALS — BP 134/64 | HR 79 | Temp 99.6°F | Resp 22 | Ht 65.0 in | Wt 145.0 lb

## 2018-04-15 DIAGNOSIS — Z23 Encounter for immunization: Secondary | ICD-10-CM

## 2018-04-15 DIAGNOSIS — E119 Type 2 diabetes mellitus without complications: Secondary | ICD-10-CM

## 2018-04-15 DIAGNOSIS — I10 Essential (primary) hypertension: Secondary | ICD-10-CM

## 2018-04-15 DIAGNOSIS — Z0000 Encounter for general adult medical examination without abnormal findings: Secondary | ICD-10-CM | POA: Diagnosis not present

## 2018-04-15 NOTE — Progress Notes (Signed)
Subjective:   Chief Complaint  Patient presents with  . Follow-up    A1c  . URI    Cough, fever, chills X 5 days    Steven [name] is a 72 y.o. male here for follow-up of diabetes.   [name]'s self monitored glucose range is low 100's.  Patient denies hypoglycemic reactions. He checks his glucose levels 1 time per day. Patient does require insulin.   Medications include: Metformin Reports diet is healthy overall.  Hypertension Patient presents for hypertension follow up. He does not monitor home blood pressures. He is compliant with medications- Norvasc, Toprol XL, losartan. Patient has these side effects of medication: none He is adhering to a healthy diet overall. Exercise: drives a fan, moves in and out of car; no scheduled exercise     Past Medical History:  Diagnosis Date  . Allergy   . Arthritis    Right Hip  . Atrial fibrillation ([name])   . COPD (chronic obstructive pulmonary disease) ([place])   . Coronary artery disease    Lexiscan Myoview 7/14:  Inf scar with minimal reversibility, inf HK, EF 45%;  Low risk.  Promus LAD stent placed in [place].  100% RCA  . Diabetes mellitus without complication ([name])   . Dyslipidemia   . GERD (gastroesophageal reflux disease)    has used Zantac in the past   . Hernia   . Hyperlipidemia   . Hypertension   . Peripheral vascular disease ([name])    a. s/p R fem-pop BPG 01/2013 (Dr. [name])  . 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    Related testing: Date of retinal exam: coming up Pneumovax: done Flu Shot: done today  Review of Systems: Pulmonary:  No SOB Cardiovascular:  No chest pain  Objective:  BP 134/64 (BP Location: Left Arm, Patient Position: Sitting)   Pulse 79   Temp 99.6 F (37.6 C) (Oral)   Resp (!) 22   Ht 5\' 5"  (1.651 m)   Wt 145 lb (65.8 kg)   SpO2 97%   BMI 24.13 kg/m  General:  Well developed, well nourished, in no apparent distress Head:  Normocephalic,  atraumatic Eyes:  Pupils equal and round, sclera anicteric without injection  Lungs:  CTAB, no access msc use Cardio:  RRR, no bruits, no LE edema Psych: Age appropriate judgment and insight  Assessment:   Diabetes mellitus without complication ([name]) - Plan: Hemoglobin A1c  Essential hypertension, benign  Preventative health care  Needs flu shot - Plan: Flu vaccine HIGH DOSE PF (Fluzone High dose), CANCELED: Flu Vaccine QUAD 6+ mos PF IM (Fluarix Quad PF)   Plan:   Orders as above. Have eye provider send info here. Counseled on diet and exercise. BP on reck much better. Cont BP meds. Flu shot today. F/u in 6 mo. The patient voiced understanding and agreement to the plan.  [place], DO 04/15/18 5:11 PM

## 2018-04-15 NOTE — Patient Instructions (Addendum)
Keep the diet clean and stay active.  Give [place] 2-3 business days to get the results of your labs back.   Have your new eye doctor send their office notes here.  Let us know if you need anything.

## 2018-04-16 LAB — HEMOGLOBIN A1C: Hgb A1c MFr Bld: 7.3 % — ABNORMAL HIGH (ref 4.6–6.5)

## 2018-04-17 DIAGNOSIS — H2513 Age-related nuclear cataract, bilateral: Secondary | ICD-10-CM | POA: Insufficient documentation

## 2018-04-17 DIAGNOSIS — D3132 Benign neoplasm of left choroid: Secondary | ICD-10-CM | POA: Insufficient documentation

## 2018-04-17 DIAGNOSIS — H40023 Open angle with borderline findings, high risk, bilateral: Secondary | ICD-10-CM | POA: Insufficient documentation

## 2018-04-23 DIAGNOSIS — M2042 Other hammer toe(s) (acquired), left foot: Secondary | ICD-10-CM | POA: Diagnosis not present

## 2018-04-23 DIAGNOSIS — M2041 Other hammer toe(s) (acquired), right foot: Secondary | ICD-10-CM | POA: Diagnosis not present

## 2018-04-25 ENCOUNTER — Telehealth: Payer: Self-pay | Admitting: Family Medicine

## 2018-04-25 NOTE — Telephone Encounter (Signed)
Copied from [place] [id number]. Topic: Quick Communication - See Telephone Encounter >> Apr 25, 2018  1:56 PM Rosalin Hawking wrote: CRM for notification. See Telephone encounter for: 04/25/18.   Pt's family dropped off document for provider to see, pt is needing a rx for Diabetic shoes. When rx ready please call pt at [phone] so pt can come pick up rx. Please advise. (document given to providers CMA)

## 2018-04-26 NOTE — Telephone Encounter (Signed)
The podiatrist office is just needing last office notes for this patient to then take form (from podiatrist) to locations to get new shoes.

## 2018-04-28 ENCOUNTER — Other Ambulatory Visit: Payer: Self-pay | Admitting: Family Medicine

## 2018-04-29 ENCOUNTER — Other Ambulatory Visit: Payer: Self-pay | Admitting: Family Medicine

## 2018-04-30 ENCOUNTER — Telehealth: Payer: Self-pay | Admitting: Family Medicine

## 2018-04-30 NOTE — Telephone Encounter (Signed)
Pt came back with diabetic form for shoes and inserts states Dr. Nani Ravens needs to provide him with a prescription because he is the primary care doctor. I let him know I would give the form to Dr. Nani Ravens in the morning.

## 2018-05-01 NOTE — Telephone Encounter (Signed)
Have received the Shoe Recommendation Form, Letter to Physician regarding notes needed, and the Statement of Certifying Physician [place] [facility] [place], [place]; sent all OV notes pertaining to need for diabetic shoes & inserts and to show PCP treats & manages pt's Diabetes Mellitus/SLS 10/09

## 2018-05-02 ENCOUNTER — Telehealth: Payer: Self-pay | Admitting: Family Medicine

## 2018-05-02 NOTE — Telephone Encounter (Signed)
Copied from [place] [id number]. Topic: General - Other >> May 02, 2018  1:35 PM Carolyn Stare wrote:  Pt request a copy if his  immunizations and he will pick up at the front desk today  Copied and at the front desk.

## 2018-05-03 ENCOUNTER — Other Ambulatory Visit: Payer: Self-pay | Admitting: Cardiology

## 2018-05-08 ENCOUNTER — Telehealth: Payer: Self-pay | Admitting: Family Medicine

## 2018-05-08 ENCOUNTER — Encounter: Payer: Self-pay | Admitting: Family Medicine

## 2018-05-08 ENCOUNTER — Ambulatory Visit: Payer: Medicare Other | Admitting: Family Medicine

## 2018-05-08 NOTE — Telephone Encounter (Signed)
Copied from [place] [id number]. Topic: Quick Communication - See Telephone Encounter >> May 08, 2018  3:50 PM Bea Graff, NT wrote: CRM for notification. See Telephone encounter for: 05/08/18. Caren Griffins, pts wife, states that [facility] has not received the rx for the orthotics and boot for this pt and need this faxed over. She spoke with Helene Kelp. Fax#: [phone]

## 2018-05-10 NOTE — Telephone Encounter (Signed)
This information is not located anywhere on the paperwork, only [place] [facility] [place] where paperwork was faxed on 05/01/18 originally and patient was in office and given this information. Paperwork faxed with conformation to [facility], Attn: Teresa at [phone] 10/18

## 2018-05-11 DIAGNOSIS — R0602 Shortness of breath: Secondary | ICD-10-CM | POA: Diagnosis not present

## 2018-05-11 DIAGNOSIS — Z9189 Other specified personal risk factors, not elsewhere classified: Secondary | ICD-10-CM | POA: Diagnosis not present

## 2018-05-11 DIAGNOSIS — E1165 Type 2 diabetes mellitus with hyperglycemia: Secondary | ICD-10-CM | POA: Diagnosis not present

## 2018-05-15 ENCOUNTER — Telehealth: Payer: Self-pay

## 2018-05-15 MED ORDER — FLUTICASONE PROPIONATE 50 MCG/ACT NA SUSP: 2.0000 | Freq: Every day | NASAL | 3 refills | Status: DC

## 2018-05-15 NOTE — Telephone Encounter (Signed)
Sent in flonase  

## 2018-05-15 NOTE — Addendum Note (Signed)
Addended by: Sharon Seller B on: 05/15/2018 02:52 PM   Modules accepted: Orders

## 2018-05-15 NOTE — Telephone Encounter (Signed)
That's fine

## 2018-05-15 NOTE — Telephone Encounter (Signed)
Received fax from [place] requesting flonase rx; flonase not on active med list. Routed to Dr. Nani Ravens to advise.

## 2018-05-16 ENCOUNTER — Other Ambulatory Visit: Payer: Self-pay | Admitting: *Deleted

## 2018-05-16 MED ORDER — FLUTICASONE PROPIONATE 50 MCG/ACT NA SUSP: 2.0000 | Freq: Every day | NASAL | 3 refills | Status: DC

## 2018-05-24 ENCOUNTER — Telehealth: Payer: Self-pay | Admitting: Family Medicine

## 2018-05-24 NOTE — Telephone Encounter (Signed)
Copied from [place] [id number]. Topic: General - Other >> May 24, 2018  3:01 PM Keene Breath wrote: Reason for CRM: Clarene Critchley called to inform doctor that she needs an order amended for Diabetic shoes and inserts.  Also there are additional documents that need to be signed.  Clarene Critchley will be faxing over these requests shortly to the office.  Please advise.  CB# [phone]

## 2018-05-27 ENCOUNTER — Encounter: Payer: Self-pay | Admitting: Family Medicine

## 2018-05-27 NOTE — Progress Notes (Signed)
Cardiology Office Note   Date:  05/29/2018   ID:  Steven, [name] 1945-10-05, MRN [id number]  PCP:  Shelda Pal, DO  Cardiologist:   Minus Breeding, MD    Chief Complaint  Patient presents with  . Coronary Artery Disease      History of Present Illness: Steven [name] is a 72 y.o. male who presents for follow up of CAD and coronary stent.  Since I last saw him he is done well.  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  He is trying to get a job at [employer].  He has had no new cardiovascular complaints since I last saw him.  He denies any chest pressure, neck or arm discomfort.  Is not any palpitations, presyncope or syncope.  He said no weight gain or edema.  He has not used any nitroglycerin.  Past Medical History:  Diagnosis Date  . Allergy   . Arthritis    Right Hip  . Atrial fibrillation ([place])   . COPD (chronic obstructive pulmonary disease) ([place])   . Coronary artery disease    Lexiscan Myoview 7/14:  Inf scar with minimal reversibility, inf HK, EF 45%;  Low risk.  Promus LAD stent placed in [place].  100% RCA  . Diabetes mellitus without complication ([place])   . Dyslipidemia   . GERD (gastroesophageal reflux disease)    has used Zantac in the past   . Hernia   . Hyperlipidemia   . Hypertension   . Peripheral vascular disease ([place])    a. s/p R fem-pop BPG 01/2013 (Dr. [name])  . 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 ABDOMINAL AORTAGRAM N/A 12/10/2012   Procedure: ABDOMINAL Maxcine Ham;  Surgeon: Serafina Mitchell, MD;  Location: [facility] CATH LAB;  Service: Cardiovascular;  Laterality: N/A;  . CARDIAC CATHETERIZATION     with stent placement  . COLONOSCOPY  March 2014  . FEMORAL-POPLITEAL BYPASS GRAFT Right 02/07/2013   Procedure: BYPASS GRAFT FEMORAL-POPLITEAL ARTERY- RIGHT;  Surgeon: Mal Misty, MD;  Location: [facility] OR;  Service:  Vascular;  Laterality: Right;  Right femoral to below knee popliteal bypass using right non reversed great saphenous vein  . INGUINAL HERNIA REPAIR  2010   Right  . INTRAOPERATIVE ARTERIOGRAM Right 02/07/2013   Procedure: Attempted INTRA OPERATIVE ARTERIOGRAM;  Surgeon: Mal Misty, MD;  Location: [facility] OR;  Service: Vascular;  Laterality: Right;     Current Outpatient Medications  Medication Sig Dispense Refill  . amLODipine (NORVASC) 5 MG tablet Take 1 tablet (5 mg total) by mouth daily. 30 tablet 4  . aspirin EC 81 MG tablet Take 81 mg by mouth daily.    . Blood Glucose Monitoring Suppl (ONETOUCH VERIO IQ SYSTEM) w/Device KIT Use to check blood sugar 2 times per day. 1 kit 2  . cetirizine (ZYRTEC) 10 MG tablet TAKE 1 TABLET BY MOUTH EVERY DAY 90 tablet 0  . ezetimibe (ZETIA) 10 MG tablet Take 1 tablet (10 mg total) by mouth daily. 90 tablet 3  . fluticasone (FLONASE) 50 MCG/ACT nasal spray Place 2 sprays into both nostrils daily. 16 g 3  . glucose blood (ONETOUCH VERIO) test strip Use to check blood sugar 2 times per day 200 each 4  . Insulin Glargine (TOUJEO SOLOSTAR) 300 UNIT/ML SOPN Inject 25 Units into the skin daily. 1.5 mL 11  .  Insulin Pen Needle (BD PEN NEEDLE NANO U/F) 32G X 4 MM MISC use as directed with INSULIN 100 each 4  . losartan (COZAAR) 100 MG tablet Take 1 tablet (100 mg total) by mouth daily. 90 tablet 1  . metFORMIN (GLUCOPHAGE) 1000 MG tablet TAKE 1 TABLET BY MOUTH TWICE A DAY WITH MEALS 180 tablet 0  . metoprolol succinate (TOPROL-XL) 100 MG 24 hr tablet Take 1 tablet (100 mg total) daily by mouth. Take with or immediately following a meal. 90 tablet 3  . mometasone (NASONEX) 50 MCG/ACT nasal spray Place 2 sprays into the nose daily. 17 g 3  . ONETOUCH DELICA LANCETS 03E MISC Use to check blood sugar 2 times per day. 200 each 2  . PEG 3350-KCl-NaBcb-NaCl-NaSulf (PEG-3350/ELECTROLYTES) 236 g SOLR   0  . rosuvastatin (CRESTOR) 40 MG tablet TAKE 1 TABLET BY MOUTH EVERY  DAY 90 tablet 0  . tamsulosin (FLOMAX) 0.4 MG CAPS capsule Take 1 capsule (0.4 mg total) by mouth daily. 90 capsule 3  . traMADol (ULTRAM) 50 MG tablet Take 1 tablet (50 mg total) by mouth every 8 (eight) hours as needed. 65 tablet 2   No current facility-administered medications for this visit.     Allergies:   Patient has no known allergies.    ROS:  Please see the history of present illness.   Otherwise, review of systems are positive for **.   All other systems are reviewed and negative.    PHYSICAL EXAM: VS:  BP (!) 172/90   Pulse 67   Ht _0  (1.651 m)   Wt 139 lb 3.2 oz (63.1 kg)   BMI 23.16 kg/m  , BMI Body mass index is 23.16 kg/m. GENERAL:  Well appearing NECK:  No jugular venous distention, waveform within normal limits, carotid upstroke brisk and symmetric, no bruits, no thyromegaly LUNGS:  Clear to auscultation bilaterally CHEST:  Unremarkable HEART:  PMI not displaced or sustained,S1 and S2 within normal limits, no S3, no S4, no clicks, no rubs, no murmurs ABD:  Flat, positive bowel sounds normal in frequency in pitch, no bruits, no rebound, no guarding, no midline pulsatile mass, no hepatomegaly, no splenomegaly EXT:  2 plus pulses upper and diminished dorsalis pedis and posterior tibialis bilateral lower, no edema, no cyanosis no clubbing   EKG:  EKG is  ordered today. The ekg ordered today demonstrates sinus rhythm, rate 67, axis within normal limits, intervals within normal limits, old inferior infarct, nonspecific inferior lateral ST depression.   Recent Labs: No results found for requested labs within last 8760 hours.    Lipid Panel    Component Value Date/Time   CHOL 156 05/24/2017 0817   TRIG 104.0 05/24/2017 0817   HDL 67.20 05/24/2017 0817   CHOLHDL 2 05/24/2017 0817   VLDL 20.8 05/24/2017 0817   LDLCALC 68 05/24/2017 0817   Lab Results  Component Value Date   HGBA1C 7.3 (H) 04/15/2018      Wt Readings from Last 3 Encounters:  05/29/18  139 lb 3.2 oz (63.1 kg)  04/15/18 145 lb (65.8 kg)  12/19/17 145 lb (65.8 kg)      Other studies Reviewed: Additional studies/ records that were reviewed today include:  Labs Review of the above records demonstrates:  See below.    ASSESSMENT AND PLAN:  CAD  Since his negative perfusion study in 2014 .  No change in therapy.  No further testing at this time.   Dyslipidemia His LDL was at target  on Crestor last year.  I will have him come back for a lipid profile.   HTN The blood pressure is elevated.  However, he says this is unusual.  He can keep a 2-week blood pressure diary and send me the results.  CKD Previous creatinine was 1.42.  And then have him get a be met when he comes back.  DM His hemoglobin A1c was 7.3.  He will continue the meds as listed and I will defer to Shelda Pal, DO   Current medicines are reviewed at length with the patient today.  The patient does not have concerns regarding medicines  The following changes have been made:  None  Labs/ tests ordered today include:   Orders Placed This Encounter  Procedures  . Basic Metabolic Panel (BMET)  . Lipid panel  . EKG 12-Lead     Disposition:   FU with me in one year.     Signed, Minus Breeding, MD  05/29/2018 5:03 PM    [place]

## 2018-05-27 NOTE — Telephone Encounter (Signed)
Received Physician Orders and reuest  to ammend attached OV notes to mention pt's need for diabetic shoes & inserts from [facility]; forwarded to provider/SLS 11/04

## 2018-05-29 ENCOUNTER — Ambulatory Visit (INDEPENDENT_AMBULATORY_CARE_PROVIDER_SITE_OTHER): Payer: Medicare Other | Admitting: Cardiology

## 2018-05-29 ENCOUNTER — Encounter: Payer: Self-pay | Admitting: Cardiology

## 2018-05-29 VITALS — BP 172/90 | HR 67 | Ht 65.0 in | Wt 139.2 lb

## 2018-05-29 DIAGNOSIS — E785 Hyperlipidemia, unspecified: Secondary | ICD-10-CM | POA: Diagnosis not present

## 2018-05-29 DIAGNOSIS — I2510 Atherosclerotic heart disease of native coronary artery without angina pectoris: Secondary | ICD-10-CM | POA: Diagnosis not present

## 2018-05-29 DIAGNOSIS — I10 Essential (primary) hypertension: Secondary | ICD-10-CM | POA: Diagnosis not present

## 2018-05-29 NOTE — Patient Instructions (Signed)
Medication Instructions:  Continue current medications  If you need a refill on your cardiac medications before your next appointment, please call your pharmacy.  Labwork: Fasting Lipid and BMP HERE IN OUR OFFICE AT [facility]  Take the provided lab slips with you to the lab for your blood draw.   You will NOT need to fast   If you have labs (blood work) drawn today and your tests are completely normal, you will receive your results only by: Marland Kitchen MyChart Message (if you have MyChart) OR . A paper copy in the mail If you have any lab test that is abnormal or we need to change your treatment, we will call you to review the results.  Testing/Procedures: None Ordered   Follow-Up: You will need a follow up appointment in 1 Year.  Please call our office 2 months in advance([phone]) to schedule the (1 Year) appointment.  You may see  DR Percival Spanish, or one of the following Advanced Practice Providers on your designated Care Team:    . Jory Sims, DNP, ANP . Rhonda Barrett, PA-C  . Kerin Ransom, [place]  . Almyra Deforest, PA-C . Fabian Sharp, PA-C  At [facility], you and your health needs are our priority.  As part of our continuing mission to provide you with exceptional heart care, we have created designated Provider Care Teams.  These Care Teams include your primary Cardiologist (physician) and Advanced Practice Providers (APPs -  Physician Assistants and Nurse Practitioners) who all work together to provide you with the care you need, when you need it.   Thank you for choosing [facility] at [facility]!!

## 2018-05-30 NOTE — Telephone Encounter (Signed)
PCP requested that patient be contacted to come in the office for appointment in order for paperwork to be completed/with office notes needed to properly complete paperwork. Spoke to the patient and scheduled appt with the patient on 05/31/2018 at 4 pm.

## 2018-05-31 ENCOUNTER — Ambulatory Visit: Payer: Self-pay | Admitting: Family Medicine

## 2018-05-31 DIAGNOSIS — Z0289 Encounter for other administrative examinations: Secondary | ICD-10-CM

## 2018-06-07 DIAGNOSIS — I2510 Atherosclerotic heart disease of native coronary artery without angina pectoris: Secondary | ICD-10-CM | POA: Diagnosis not present

## 2018-06-07 DIAGNOSIS — E785 Hyperlipidemia, unspecified: Secondary | ICD-10-CM | POA: Diagnosis not present

## 2018-06-07 LAB — BASIC METABOLIC PANEL
BUN / CREAT RATIO: 12 (ref 10–24)
BUN: 17 mg/dL (ref 8–27)
CALCIUM: 10 mg/dL (ref 8.6–10.2)
CO2: 22 mmol/L (ref 20–29)
CREATININE: 1.37 mg/dL — AB (ref 0.76–1.27)
Chloride: 101 mmol/L (ref 96–106)
GFR calc Af Amer: 60 mL/min/{1.73_m2} (ref >59)
GFR calc non Af Amer: 52 mL/min/{1.73_m2} — ABNORMAL LOW (ref >59)
GLUCOSE: 150 mg/dL — AB (ref 65–99)
Potassium: 4.4 mmol/L (ref 3.5–5.2)
Sodium: 138 mmol/L (ref 134–144)

## 2018-06-07 LAB — LIPID PANEL
CHOLESTEROL TOTAL: 163 mg/dL (ref 100–199)
Chol/HDL Ratio: 2.1 ratio (ref 0.0–5.0)
HDL: 79 mg/dL (ref >39)
LDL CALC: 64 mg/dL (ref 0–99)
TRIGLYCERIDES: 100 mg/dL (ref 0–149)
VLDL CHOLESTEROL CAL: 20 mg/dL (ref 5–40)

## 2018-06-22 DIAGNOSIS — R05 Cough: Secondary | ICD-10-CM | POA: Diagnosis not present

## 2018-07-20 DIAGNOSIS — Z79899 Other long term (current) drug therapy: Secondary | ICD-10-CM | POA: Diagnosis not present

## 2018-07-20 DIAGNOSIS — Z113 Encounter for screening for infections with a predominantly sexual mode of transmission: Secondary | ICD-10-CM | POA: Diagnosis not present

## 2018-07-20 DIAGNOSIS — Z0000 Encounter for general adult medical examination without abnormal findings: Secondary | ICD-10-CM | POA: Diagnosis not present

## 2018-07-20 DIAGNOSIS — E559 Vitamin D deficiency, unspecified: Secondary | ICD-10-CM | POA: Diagnosis not present

## 2018-07-20 DIAGNOSIS — D539 Nutritional anemia, unspecified: Secondary | ICD-10-CM | POA: Diagnosis not present

## 2018-07-20 DIAGNOSIS — R0602 Shortness of breath: Secondary | ICD-10-CM | POA: Diagnosis not present

## 2018-07-20 DIAGNOSIS — Z1331 Encounter for screening for depression: Secondary | ICD-10-CM | POA: Diagnosis not present

## 2018-07-20 DIAGNOSIS — M129 Arthropathy, unspecified: Secondary | ICD-10-CM | POA: Diagnosis not present

## 2018-07-20 DIAGNOSIS — Z1339 Encounter for screening examination for other mental health and behavioral disorders: Secondary | ICD-10-CM | POA: Diagnosis not present

## 2018-07-20 DIAGNOSIS — K4090 Unilateral inguinal hernia, without obstruction or gangrene, not specified as recurrent: Secondary | ICD-10-CM | POA: Diagnosis not present

## 2018-07-20 DIAGNOSIS — I10 Essential (primary) hypertension: Secondary | ICD-10-CM | POA: Diagnosis not present

## 2018-07-20 DIAGNOSIS — Z125 Encounter for screening for malignant neoplasm of prostate: Secondary | ICD-10-CM | POA: Diagnosis not present

## 2018-07-20 DIAGNOSIS — E1165 Type 2 diabetes mellitus with hyperglycemia: Secondary | ICD-10-CM | POA: Diagnosis not present

## 2018-07-20 DIAGNOSIS — R5383 Other fatigue: Secondary | ICD-10-CM | POA: Diagnosis not present

## 2018-08-07 ENCOUNTER — Other Ambulatory Visit: Payer: Self-pay | Admitting: Cardiology

## 2018-08-07 ENCOUNTER — Other Ambulatory Visit: Payer: Self-pay | Admitting: Family Medicine

## 2018-08-07 NOTE — Telephone Encounter (Signed)
Rx request sent to pharmacy.  

## 2018-08-30 ENCOUNTER — Other Ambulatory Visit: Payer: Self-pay | Admitting: Family Medicine

## 2018-08-30 DIAGNOSIS — I10 Essential (primary) hypertension: Secondary | ICD-10-CM

## 2018-09-05 DIAGNOSIS — K4090 Unilateral inguinal hernia, without obstruction or gangrene, not specified as recurrent: Secondary | ICD-10-CM | POA: Insufficient documentation

## 2018-09-11 HISTORY — PX: INGUINAL HERNIA REPAIR: SUR1180

## 2018-09-28 ENCOUNTER — Other Ambulatory Visit: Payer: Self-pay | Admitting: Cardiology

## 2018-09-28 DIAGNOSIS — I10 Essential (primary) hypertension: Secondary | ICD-10-CM

## 2018-10-01 NOTE — Telephone Encounter (Signed)
Rx(s) sent to pharmacy electronically.  

## 2018-10-10 ENCOUNTER — Other Ambulatory Visit: Payer: Self-pay | Admitting: Family Medicine

## 2018-10-10 ENCOUNTER — Other Ambulatory Visit: Payer: Self-pay | Admitting: Internal Medicine

## 2018-10-21 ENCOUNTER — Encounter (HOSPITAL_COMMUNITY): Payer: Medicare Other

## 2018-10-21 ENCOUNTER — Other Ambulatory Visit (HOSPITAL_COMMUNITY): Payer: Self-pay

## 2018-10-21 ENCOUNTER — Ambulatory Visit: Payer: Self-pay | Admitting: Family

## 2018-10-21 ENCOUNTER — Telehealth: Payer: Self-pay | Admitting: Cardiology

## 2018-10-21 MED ORDER — ROSUVASTATIN CALCIUM 40 MG PO TABS: 40.0000 mg | ORAL_TABLET | Freq: Every day | ORAL | 3 refills | Status: DC

## 2018-10-21 NOTE — Telephone Encounter (Signed)
Rx request faxed from [facility] for Rosuvastatin 40 mg.   Refill sent for 90 day with 3 refills.

## 2018-10-21 NOTE — Telephone Encounter (Signed)
Pt requesting refill of Nitroglycerin SUBL. I do not see this in his medicine list currently. Routing to Dr. Percival Spanish to advise if ok to send in.

## 2018-10-22 ENCOUNTER — Other Ambulatory Visit: Payer: Self-pay | Admitting: Family Medicine

## 2018-10-22 MED ORDER — METFORMIN HCL 1000 MG PO TABS: 1000.0000 mg | ORAL_TABLET | Freq: Two times a day (BID) | ORAL | 0 refills | Status: DC

## 2018-10-22 MED ORDER — CETIRIZINE HCL 10 MG PO TABS: 10.0000 mg | ORAL_TABLET | Freq: Every day | ORAL | 0 refills | Status: DC

## 2018-10-23 ENCOUNTER — Other Ambulatory Visit: Payer: Self-pay

## 2018-10-28 NOTE — Telephone Encounter (Signed)
Call and spoke with to asked which pharmacy he will like for Ntg SL to be send to, pt stated he do not need any ntg sl he have an unopened bottle with him and he do not need another one

## 2018-10-28 NOTE — Telephone Encounter (Signed)
OK to prescribe SLNTG.

## 2018-11-28 ENCOUNTER — Ambulatory Visit: Payer: Self-pay | Admitting: *Deleted

## 2019-03-21 ENCOUNTER — Other Ambulatory Visit: Payer: Self-pay

## 2019-04-15 ENCOUNTER — Other Ambulatory Visit: Payer: Self-pay

## 2019-04-15 DIAGNOSIS — I70212 Atherosclerosis of native arteries of extremities with intermittent claudication, left leg: Secondary | ICD-10-CM

## 2019-04-16 ENCOUNTER — Ambulatory Visit (INDEPENDENT_AMBULATORY_CARE_PROVIDER_SITE_OTHER)
Admission: RE | Admit: 2019-04-16 | Discharge: 2019-04-16 | Disposition: A | Discharge status: Home / Self Care | Payer: Medicare Other | Source: Ambulatory Visit | Attending: Family | Admitting: Family

## 2019-04-16 ENCOUNTER — Other Ambulatory Visit: Payer: Self-pay

## 2019-04-16 ENCOUNTER — Ambulatory Visit (INDEPENDENT_AMBULATORY_CARE_PROVIDER_SITE_OTHER): Payer: Medicare Other | Admitting: Family

## 2019-04-16 ENCOUNTER — Encounter: Payer: Self-pay | Admitting: Family

## 2019-04-16 ENCOUNTER — Ambulatory Visit (HOSPITAL_COMMUNITY)
Admission: RE | Admit: 2019-04-16 | Discharge: 2019-04-16 | Disposition: A | Discharge status: Home / Self Care | Payer: Medicare Other | Source: Ambulatory Visit | Attending: Family | Admitting: Family

## 2019-04-16 VITALS — BP 193/80 | HR 61 | Temp 97.6°F | Resp 12 | Ht 65.0 in | Wt 135.0 lb

## 2019-04-16 DIAGNOSIS — I70212 Atherosclerosis of native arteries of extremities with intermittent claudication, left leg: Secondary | ICD-10-CM | POA: Diagnosis present

## 2019-04-16 DIAGNOSIS — Z95828 Presence of other vascular implants and grafts: Secondary | ICD-10-CM | POA: Diagnosis not present

## 2019-04-16 DIAGNOSIS — Z87891 Personal history of nicotine dependence: Secondary | ICD-10-CM

## 2019-04-16 NOTE — Patient Instructions (Signed)
Peripheral Vascular Disease  Peripheral vascular disease (PVD) is a disease of the blood vessels that are not part of your heart and brain. A simple term for PVD is poor circulation. In most cases, PVD narrows the blood vessels that carry blood from your heart to the rest of your body. This can reduce the supply of blood to your arms, legs, and internal organs, like your stomach or kidneys. However, PVD most often affects a person's lower legs and feet. Without treatment, PVD tends to get worse. PVD can also lead to acute ischemic limb. This is when an arm or leg suddenly cannot get enough blood. This is a medical emergency. Follow these instructions at home: Lifestyle  Do not use any products that contain nicotine or tobacco, such as cigarettes and e-cigarettes. If you need help quitting, ask your doctor.  Lose weight if you are overweight. Or, stay at a healthy weight as told by your doctor.  Eat a diet that is low in fat and cholesterol. If you need help, ask your doctor.  Exercise regularly. Ask your doctor for activities that are right for you. General instructions  Take over-the-counter and prescription medicines only as told by your doctor.  Take good care of your feet: ? Wear comfortable shoes that fit well. ? Check your feet often for any cuts or sores.  Keep all follow-up visits as told by your doctor This is important. Contact a doctor if:  You have cramps in your legs when you walk.  You have leg pain when you are at rest.  You have coldness in a leg or foot.  Your skin changes.  You are unable to get or have an erection (erectile dysfunction).  You have cuts or sores on your feet that do not heal. Get help right away if:  Your arm or leg turns cold, numb, and blue.  Your arms or legs become red, warm, swollen, painful, or numb.  You have chest pain.  You have trouble breathing.  You suddenly have weakness in your face, arm, or leg.  You become very  confused or you cannot speak.  You suddenly have a very bad headache.  You suddenly cannot see. Summary  Peripheral vascular disease (PVD) is a disease of the blood vessels.  A simple term for PVD is poor circulation. Without treatment, PVD tends to get worse.  Treatment may include exercise, low fat and low cholesterol diet, and quitting smoking. This information is not intended to replace advice given to you by your health care provider. Make sure you discuss any questions you have with your health care provider. Document Released: 10/04/2009 Document Revised: 06/22/2017 Document Reviewed: 08/17/2016 Elsevier Patient Education  2020 Elsevier Inc.  

## 2019-04-16 NOTE — Progress Notes (Signed)
[facility] [place]   CC: Follow up peripheral artery occlusive disease  History of Present Illness Steven [name] is a 73 y.o. male who is status post right femoral-popliteal bypass with saphenous vein graft in July of 2014 by Dr. Kellie Simmering for severe claudication right leg.  He denies non healing wounds.  He has bilateral hip pain if he walks fast or a long distance, denies hip pain in the morning. He also has bilateral calf painwhen he walks fast, relieved by rest.  He was scheduled for an aortogram by Dr. Donzetta Matters in March 2018 with possible intervention, but pt cancelled due to his wife having foot surgery.  Pt returns today for routine follow up.   At his 09-06-16 visit he c/o left foot feeling heavy after walking fast at 50 feet, relieved by rest. He denies left foot feeling heavy if he walks at a normal pace; also he wears steel toed boots. Dr. Ronnald Ramp, his PCP, referred him for this.   Pt indicates that his life is not limited by his hips and calf pain, he is able to do anything he wants to do.   He has CAD, sees a cardiologist.  Diabetic: Yes, review of records: A1C on 04-15-18 was 7.3, improved from 8.2,  Tobacco use: former smoker, quit in 2012  Pt meds include: Statin: yes, resumed June 2016 Betablocker: Yes ASA: Yes Other anticoagulants/antiplatelets: no   Past Medical History:  Diagnosis Date  . Allergy   . Arthritis    Right Hip  . Atrial fibrillation ([place])   . COPD (chronic obstructive pulmonary disease) ([place])   . Coronary artery disease    Lexiscan Myoview 7/14:  Inf scar with minimal reversibility, inf HK, EF 45%;  Low risk.  Promus LAD stent placed in [place].  100% RCA  . Diabetes mellitus without complication ([place])   . Dyslipidemia   . GERD (gastroesophageal reflux disease)    has used Zantac in the past   . Hernia   . Hyperlipidemia   . Hypertension   . Peripheral vascular disease ([place])    a. s/p R fem-pop BPG 01/2013 (Dr.  [name])  . 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    Social History Social History   Tobacco Use  . Smoking status: Former Smoker    Packs/day: 0.30    Years: 50.00    Pack years: 15.00    Types: Cigarettes    Quit date: 02/04/2011    Years since quitting: 8.2  . Smokeless tobacco: Never Used  Substance Use Topics  . Alcohol use: Yes    Alcohol/week: 3.0 standard drinks    Types: 3 Cans of beer per week    Comment: everyday, beer 40 ozs & alcohol- 1-2 shots   . Drug use: No    Family History Family History  Problem Relation Age of Onset  . Diabetes Mother   . Hypertension Mother   . Heart disease Mother   . Hyperlipidemia Mother   . Diabetes Father   . Heart disease Father   . Hyperlipidemia Father   . Hypertension Father   . Diabetes Sister   . Heart disease Sister   . Hyperlipidemia Sister   . Hypertension Sister   . Diabetes Brother   . Heart disease Brother   . Hyperlipidemia Brother   . Hypertension Brother   . Diabetes Son   . Heart disease Son   . Hyperlipidemia Son   .  Hypertension Son   . Pancreatic cancer Son   . Alcohol abuse Neg Hx   . Cancer Neg Hx   . Depression Neg Hx   . Early death Neg Hx   . Kidney disease Neg Hx   . Stroke Neg Hx   . Colon cancer Neg Hx   . Esophageal cancer Neg Hx   . Rectal cancer Neg Hx   . Stomach cancer Neg Hx   . Liver cancer Neg Hx     Past Surgical History:  Procedure Laterality Date  . ABDOMINAL AORTAGRAM N/A 12/10/2012   Procedure: ABDOMINAL Maxcine Ham;  Surgeon: Serafina Mitchell, MD;  Location: [facility] C[facility] LAB;  Service: Cardiovascular;  Laterality: N/A;  . CARDIAC CATHETERIZATION     with stent placement  . COLONOSCOPY  March 2014  . FEMORAL-POPLITEAL BYPASS GRAFT Right 02/07/2013   Procedure: BYPASS GRAFT FEMORAL-POPLITEAL ARTERY- RIGHT;  Surgeon: Mal Misty, MD;  Location: [facility] OR;  Service: Vascular;  Laterality: Right;  Right femoral to below knee popliteal bypass  using right non reversed great saphenous vein  . INGUINAL HERNIA REPAIR  2010   Right  . INTRAOPERATIVE ARTERIOGRAM Right 02/07/2013   Procedure: Attempted INTRA OPERATIVE ARTERIOGRAM;  Surgeon: Mal Misty, MD;  Location: [facility] OR;  Service: Vascular;  Laterality: Right;    No Known Allergies  Current Outpatient Medications  Medication Sig Dispense Refill  . amLODipine (NORVASC) 5 MG tablet Take 1 tablet (5 mg total) by mouth daily. 30 tablet 4  . aspirin EC 81 MG tablet Take 81 mg by mouth daily.    . Blood Glucose Monitoring Suppl (ONETOUCH VERIO IQ SYSTEM) w/Device KIT Use to check blood sugar 2 times per day. 1 kit 2  . ezetimibe (ZETIA) 10 MG tablet Take 1 tablet (10 mg total) by mouth daily. 90 tablet 3  . fluticasone (FLONASE) 50 MCG/ACT nasal spray Place 2 sprays into both nostrils daily. 16 g 3  . glucose blood (ONETOUCH VERIO) test strip Use to check blood sugar 2 times per day 200 each 4  . Insulin Glargine (TOUJEO SOLOSTAR) 300 UNIT/ML SOPN Inject 25 Units into the skin daily. 1.5 mL 11  . Insulin Pen Needle (BD PEN NEEDLE NANO U/F) 32G X 4 MM MISC use as directed with INSULIN 100 each 4  . losartan (COZAAR) 100 MG tablet Take 1 tablet (100 mg total) by mouth daily. 90 tablet 2  . metFORMIN (GLUCOPHAGE) 1000 MG tablet Take 1 tablet (1,000 mg total) by mouth 2 (two) times daily with a meal. 180 tablet 0  . metoprolol succinate (TOPROL-XL) 100 MG 24 hr tablet Take 1 tablet (100 mg total) by mouth daily. 90 tablet 2  . mometasone (NASONEX) 50 MCG/ACT nasal spray PLACE 2 SPRAYS INTO THE NOSE DAILY 17 g 3  . ONETOUCH DELICA LANCETS 55H MISC Use to check blood sugar 2 times per day. 200 each 2  . PEG 3350-KCl-NaBcb-NaCl-NaSulf (PEG-3350/ELECTROLYTES) 236 g SOLR   0  . rosuvastatin (CRESTOR) 40 MG tablet Take 1 tablet (40 mg total) by mouth daily. 90 tablet 3  . tamsulosin (FLOMAX) 0.4 MG CAPS capsule TAKE 1 CAPSULE(0.4 MG) BY MOUTH DAILY 90 capsule 3  . traMADol (ULTRAM) 50 MG tablet  Take 1 tablet (50 mg total) by mouth every 8 (eight) hours as needed. 65 tablet 2   No current facility-administered medications for this visit.     ROS: See HPI for pertinent positives and negatives.   Physical Examination  Vitals:  04/16/19 1130  BP: (!) 193/80  Pulse: 61  Resp: 12  Temp: 97.6 F (36.4 C)  TempSrc: Temporal  SpO2: 100%  Weight: 135 lb (61.2 kg)  Height: 5' 5" (1.651 m)   Body mass index is 22.47 kg/m.  General: A&O x 3, WDWN, jovial small fit appearing male in NAD. Gait: normal HEENT: No gross abnormalities.  Pulmonary: Respirations are non labored, CTAB, good air movement in all fields Cardiac: regular rhythm, no detected murmur.         Carotid Bruits Right Left   Negative Negative   Radial pulses are 2+ palpable bilaterally   Adominal aortic pulse is not palpable                         VASCULAR EXAM: Extremities without ischemic changes, without Gangrene; without open wounds.                                                                                                          LE Pulses Right Left       FEMORAL  2+ palpable  2+ palpable        POPLITEAL  not palpable   not palpable       POSTERIOR TIBIAL  not palpable   not palpable        DORSALIS PEDIS      ANTERIOR TIBIAL not palpable  not palpable    Abdomen: soft, NT, no palpable masses. Skin: no rashes, no cellulitis, no ulcers noted. Musculoskeletal: no muscle wasting or atrophy.  Neurologic: A&O X 3; appropriate affect, Sensation is normal; MOTOR FUNCTION:  moving all extremities equally, motor strength 5/5 throughout. Speech is fluent/normal. CN 2-12 intact. Psychiatric: Thought content is normal, mood appropriate for clinical situation.    DATA  Right LE Arterial Duplex (04-16-19): Right Graft #1: +------------------+--------+--------+--------+-----------------+                   PSV cm/sStenosisWaveformComments           +------------------+--------+--------+--------+-----------------+ Inflow            235             biphasic                  +------------------+--------+--------+--------+-----------------+ Prox Anastomosis  260             biphasicplaque visualized +------------------+--------+--------+--------+-----------------+ Proximal Graft    61              biphasic                  +------------------+--------+--------+--------+-----------------+ Mid Graft         68              biphasic                  +------------------+--------+--------+--------+-----------------+ Distal Graft      55              biphasic                  +------------------+--------+--------+--------+-----------------+  Distal Anastomosis91              biphasic                  +------------------+--------+--------+--------+-----------------+ Outflow           79              biphasic                  +------------------+--------+--------+--------+-----------------+ Summary: Right: Patent right femoral to below knee popliteal bypass graft. 50 - 74% stenosis in the inflow artery and proximal anastamosis.    ABI Findings (04-16-19): +---------+------------------+-----+---------+--------+ Right    Rt Pressure (mmHg)IndexWaveform Comment  +---------+------------------+-----+---------+--------+ Brachial 176                                      +---------+------------------+-----+---------+--------+ PTA      216               1.23 triphasic         +---------+------------------+-----+---------+--------+ DP       209               1.19 triphasic         +---------+------------------+-----+---------+--------+ Great Toe94                0.53 Abnormal          +---------+------------------+-----+---------+--------+  +---------+------------------+-----+----------+-------+ Left     Lt Pressure (mmHg)IndexWaveform   Comment +---------+------------------+-----+----------+-------+ Brachial 176                                      +---------+------------------+-----+----------+-------+ PTA      124               0.70 monophasic        +---------+------------------+-----+----------+-------+ DP       116               0.66 monophasic        +---------+------------------+-----+----------+-------+ Great Toe83                0.47 Abnormal          +---------+------------------+-----+----------+-------+  +-------+-----------+-----------+------------+------------+ ABI/TBIToday's ABIToday's TBIPrevious ABIPrevious TBI +-------+-----------+-----------+------------+------------+ Right  1.23       0.53       0.97        0.81         +-------+-----------+-----------+------------+------------+ Left   0.70       0.47       0.52        0.29         +-------+-----------+-----------+------------+------------+ Summary: Right: Resting right ankle-brachial index is within normal range. No evidence of significant right lower extremity arterial disease. The right toe-brachial index is abnormal.  Left: Resting left ankle-brachial index indicates moderate left lower extremity arterial disease. The left toe-brachial index is abnormal.   ABI (Date: 05/14/2017):  R:  ? ABI: 0.96 (was 0.85 on 09-06-16),  ? PT: bi ? DP: bi ? TBI:  0.81 (was 0.40)  L:  ? ABI: 0.52 (was 0.40),  ? PT: mono ? DP: mono ? TBI: 0.29 (was 0.12)  Improved bilateral ABI and TBI.   Carotid Duplex (05/14/17): <40% bilateral ICA stenosis.  Bilateral vertebral artery flow is antegrade.  Bilateral subclavian artery waveforms are normal.  No prior carotid exam  for comparison.     ASSESSMENT: PARKS CZAJKOWSKI is a 73 y.o. male who is status post right femoral-popliteal bypass with saphenous vein graft in July of 2014 for severe claudication right leg. He is having no right leg claudication symptoms.   He walks a great deal as part of his job which facilitates the formation of collateral arterial perfusion. He no longer c/o life limiting claudication sx's. He has no signs of ischemiain his feet or toes which are pink and warm.  ABI's: arterial perfusion remains normal in the right with triphasic waveforms, improved in the left from 0.52 to 0.7, monophasic waveforms. He walks a great deal. Bilateral femoral pulses are 2+ palpable.   He indicates that he has been referred to an endocrinologist later in February, 2018.   His atherosclerotic risk factors include uncontrolled but improvingDM, former smoker, elevated blood pressure, and CAD.    PLAN:  Based on the patient's vascular studies and examination, pt will return to clinic in 1 year with ABI's and right LE arterial duplex, carotid duplex in 2 years. I advised him to return sooner if the pain in his left calf worsens.  I discussed in depth with the patient the nature of atherosclerosis, and emphasized the importance of maximal medical management including strict control of blood pressure, blood glucose, and lipid levels, obtaining regular exercise, and continued cessation of smoking.  The patient is aware that without maximal medical management the underlying atherosclerotic disease process will progress, limiting the benefit of any interventions.  The patient was given information about PAD including signs, symptoms, treatment, what symptoms should prompt the patient to seek immediate medical care, and risk reduction measures to take.  Clemon Chambers, RN, MSN, FNP-C [facility] Phone: [phone]  Clinic MD: Laqueta Due  04/16/19 11:58 AM

## 2019-06-05 NOTE — Progress Notes (Signed)
Cardiology Office Note   Date:  06/06/2019   ID:  Tanor, Glaspy 07/17/1946, MRN [id number]  PCP:  System, Pcp Not In  Cardiologist:   Minus Breeding, MD    Chief Complaint  Patient presents with  . Coronary Artery Disease      History of Present Illness: Steven [name] is a 73 y.o. male who presents for follow up of CAD and coronary stent.  Since I last saw him he has had no new cardiovascular complaints.  He works at an active job. The patient denies any new symptoms such as chest discomfort, neck or arm discomfort. There has been no new shortness of breath, PND or orthopnea. There have been no reported palpitations, presyncope or syncope.    Past Medical History:  Diagnosis Date  . Allergy   . Arthritis    Right Hip  . Atrial fibrillation ([place])   . COPD (chronic obstructive pulmonary disease) ([place])   . Coronary artery disease    Lexiscan Myoview 7/14:  Inf scar with minimal reversibility, inf HK, EF 45%;  Low risk.  Promus LAD stent placed in [place].  100% RCA  . Diabetes mellitus without complication ([place])   . Dyslipidemia   . GERD (gastroesophageal reflux disease)    has used Zantac in the past   . Hernia   . Hyperlipidemia   . Hypertension   . Peripheral vascular disease ([place])    a. s/p R fem-pop BPG 01/2013 (Dr. [name])  . 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 ABDOMINAL AORTAGRAM N/A 12/10/2012   Procedure: ABDOMINAL Maxcine Ham;  Surgeon: Serafina Mitchell, MD;  Location: [facility] CATH LAB;  Service: Cardiovascular;  Laterality: N/A;  . CARDIAC CATHETERIZATION     with stent placement  . COLONOSCOPY  March 2014  . FEMORAL-POPLITEAL BYPASS GRAFT Right 02/07/2013   Procedure: BYPASS GRAFT FEMORAL-POPLITEAL ARTERY- RIGHT;  Surgeon: Mal Misty, MD;  Location: [facility] OR;  Service: Vascular;  Laterality: Right;  Right femoral to below knee popliteal bypass using right  non reversed great saphenous vein  . INGUINAL HERNIA REPAIR  2010   Right  . INTRAOPERATIVE ARTERIOGRAM Right 02/07/2013   Procedure: Attempted INTRA OPERATIVE ARTERIOGRAM;  Surgeon: Mal Misty, MD;  Location: [facility] OR;  Service: Vascular;  Laterality: Right;     Current Outpatient Medications  Medication Sig Dispense Refill  . amLODipine (NORVASC) 5 MG tablet Take 1 tablet (5 mg total) by mouth daily. 30 tablet 4  . aspirin EC 81 MG tablet Take 81 mg by mouth daily.    . Blood Glucose Monitoring Suppl (ONETOUCH VERIO IQ SYSTEM) w/Device KIT Use to check blood sugar 2 times per day. 1 kit 2  . ezetimibe (ZETIA) 10 MG tablet Take 1 tablet (10 mg total) by mouth daily. 90 tablet 3  . fluticasone (FLONASE) 50 MCG/ACT nasal spray Place 2 sprays into both nostrils daily. 16 g 3  . glucose blood (ONETOUCH VERIO) test strip Use to check blood sugar 2 times per day 200 each 4  . ibuprofen (ADVIL) 800 MG tablet Take 800 mg by mouth every 6 (six) hours as needed.    . Insulin Glargine (TOUJEO SOLOSTAR) 300 UNIT/ML SOPN Inject 25 Units into the skin daily. 1.5 mL 11  . Insulin Pen Needle (BD PEN NEEDLE NANO U/F) 32G X 4 MM MISC use as directed with  INSULIN 100 each 4  . losartan (COZAAR) 100 MG tablet Take 1 tablet (100 mg total) by mouth daily. 90 tablet 2  . metFORMIN (GLUCOPHAGE) 1000 MG tablet Take 1 tablet (1,000 mg total) by mouth 2 (two) times daily with a meal. 180 tablet 0  . metoprolol succinate (TOPROL-XL) 100 MG 24 hr tablet Take 1 tablet (100 mg total) by mouth daily. 90 tablet 2  . mometasone (NASONEX) 50 MCG/ACT nasal spray PLACE 2 SPRAYS INTO THE NOSE DAILY 17 g 3  . ONETOUCH DELICA LANCETS 33A MISC Use to check blood sugar 2 times per day. 200 each 2  . PEG 3350-KCl-NaBcb-NaCl-NaSulf (PEG-3350/ELECTROLYTES) 236 g SOLR   0  . rosuvastatin (CRESTOR) 40 MG tablet Take 1 tablet (40 mg total) by mouth daily. 90 tablet 3  . tamsulosin (FLOMAX) 0.4 MG CAPS capsule TAKE 1 CAPSULE(0.4 MG) BY  MOUTH DAILY 90 capsule 3  . traMADol (ULTRAM) 50 MG tablet Take 1 tablet (50 mg total) by mouth every 8 (eight) hours as needed. 65 tablet 2   No current facility-administered medications for this visit.     Allergies:   Patient has no known allergies.    ROS:  Please see the history of present illness.   Otherwise, review of systems are positive for none.   All other systems are reviewed and negative.    PHYSICAL EXAM: VS:  BP (!) 157/71   Pulse 63   Temp (!) 97.2 F (36.2 C)   Ht _0  (1.651 m)   Wt 141 lb 3.2 oz (64 kg)   BMI 23.50 kg/m  , BMI Body mass index is 23.5 kg/m. GENERAL:  Well appearing NECK:  No jugular venous distention, waveform within normal limits, carotid upstroke brisk and symmetric, no bruits, no thyromegaly LUNGS:  Clear to auscultation bilaterally CHEST:  Unremarkable HEART:  PMI not displaced or sustained,S1 and S2 within normal limits, no S3, no S4, no clicks, no rubs, no murmurs ABD:  Flat, positive bowel sounds normal in frequency in pitch, no bruits, no rebound, no guarding, no midline pulsatile mass, no hepatomegaly, no splenomegaly EXT:  2 plus pulses throughout, no edema, no cyanosis no clubbing   EKG:  EKG is  ordered today. The ekg ordered today demonstrates sinus rhythm, rate 59, axis within normal limits, intervals within normal limits, old inferior infarct, nonspecific inferior lateral ST depression.   Recent Labs: 06/07/2018: BUN 17; Creatinine, Ser 1.37; Potassium 4.4; Sodium 138    Lipid Panel    Component Value Date/Time   CHOL 163 06/07/2018 0915   TRIG 100 06/07/2018 0915   HDL 79 06/07/2018 0915   CHOLHDL 2.1 06/07/2018 0915   CHOLHDL 2 05/24/2017 0817   VLDL 20.8 05/24/2017 0817   LDLCALC 64 06/07/2018 0915   Lab Results  Component Value Date   HGBA1C 7.3 (H) 04/15/2018      Wt Readings from Last 3 Encounters:  06/06/19 141 lb 3.2 oz (64 kg)  04/16/19 135 lb (61.2 kg)  05/29/18 139 lb 3.2 oz (63.1 kg)       Other studies Reviewed: Additional studies/ records that were reviewed today include:  Labs Review of the above records demonstrates:    ASSESSMENT AND PLAN:  CAD  The patient has no new sypmtoms.  No further cardiovascular testing is indicated.  We will continue with aggressive risk reduction and meds as listed.  Dyslipidemia His LDL was at target last year but I will try to get the records from  the primary care physician.   HTN The blood pressure is at target.  No change in therapy.    CKD Previous creatinine was mildly elevated today but we can follow this.  DM His hemoglobin A1c was  7.3.  However, this was last year.  If he is not at target I might suggest therapy with the SGLT2 inhibitor or GLP-1 receptor antagonist.  I would consider for COORDINATE trial.   Current medicines are reviewed at length with the patient today.  The patient does not have concerns regarding medicines  The following changes have been made:  None  Labs/ tests ordered today include:  None  Orders Placed This Encounter  Procedures  . EKG 12-Lead     Disposition:   FU with me in one year.     Signed, Minus Breeding, MD  06/06/2019 5:28 PM    [facility]

## 2019-06-06 ENCOUNTER — Other Ambulatory Visit: Payer: Self-pay

## 2019-06-06 ENCOUNTER — Encounter: Payer: Self-pay | Admitting: Cardiology

## 2019-06-06 ENCOUNTER — Ambulatory Visit (INDEPENDENT_AMBULATORY_CARE_PROVIDER_SITE_OTHER): Payer: Medicare Other | Admitting: Cardiology

## 2019-06-06 VITALS — BP 157/71 | HR 63 | Temp 97.2°F | Ht 65.0 in | Wt 141.2 lb

## 2019-06-06 DIAGNOSIS — E785 Hyperlipidemia, unspecified: Secondary | ICD-10-CM | POA: Diagnosis not present

## 2019-06-06 DIAGNOSIS — I2510 Atherosclerotic heart disease of native coronary artery without angina pectoris: Secondary | ICD-10-CM

## 2019-06-06 DIAGNOSIS — E118 Type 2 diabetes mellitus with unspecified complications: Secondary | ICD-10-CM | POA: Diagnosis not present

## 2019-06-06 DIAGNOSIS — I10 Essential (primary) hypertension: Secondary | ICD-10-CM

## 2019-06-06 NOTE — Patient Instructions (Signed)
Medication Instructions:  NO CHANGE *If you need a refill on your cardiac medications before your next appointment, please call your pharmacy*  Lab Work: If you have labs (blood work) drawn today and your tests are completely normal, you will receive your results only by: Marland Kitchen MyChart Message (if you have MyChart) OR . A paper copy in the mail If you have any lab test that is abnormal or we need to change your treatment, we will call you to review the results.  Follow-Up: At [facility], you and your health needs are our priority.  As part of our continuing mission to provide you with exceptional heart care, we have created designated Provider Care Teams.  These Care Teams include your primary Cardiologist (physician) and Advanced Practice Providers (APPs -  Physician Assistants and Nurse Practitioners) who all work together to provide you with the care you need, when you need it.  Your next appointment:   12 months  The format for your next appointment:   In Person  Provider:   You may see Minus Breeding MD or one of the following Advanced Practice Providers on your designated Care Team:    Rosaria Ferries, PA-C  Jory Sims, DNP, ANP  Cadence Kathlen Mody, NP

## 2019-09-17 LAB — HM DIABETES EYE EXAM

## 2019-09-19 ENCOUNTER — Ambulatory Visit: Payer: Medicare Other | Attending: Internal Medicine

## 2019-09-19 ENCOUNTER — Other Ambulatory Visit: Payer: Self-pay

## 2019-09-19 DIAGNOSIS — Z23 Encounter for immunization: Secondary | ICD-10-CM | POA: Insufficient documentation

## 2019-09-19 NOTE — Progress Notes (Signed)
   Covid-19 Vaccination Clinic  Name:  Steven [name]    MRN: [id number] DOB: December 31, 1945  09/19/2019  [name] was observed post Covid-19 immunization for 15 minutes without incidence. He was provided with Vaccine Information Sheet and instruction to access the V-Safe system.   Mr. Post was instructed to call 911 with any severe reactions post vaccine: Marland Kitchen Difficulty breathing  . Swelling of your face and throat  . A fast heartbeat  . A bad rash all over your body  . Dizziness and weakness    Immunizations Administered    Name Date Dose VIS Date Route   Pfizer COVID-19 Vaccine 09/19/2019  4:30 PM 0.3 mL 07/04/2019 Intramuscular   Manufacturer: [place]   Lot: [id number]   [place]: [id number]

## 2019-10-15 ENCOUNTER — Ambulatory Visit: Payer: Medicare Other | Attending: Internal Medicine

## 2019-10-15 DIAGNOSIS — Z23 Encounter for immunization: Secondary | ICD-10-CM

## 2019-10-15 NOTE — Progress Notes (Signed)
   Covid-19 Vaccination Clinic  Name:  Steven [name]    MRN: [id number] DOB: 08-Nov-1945  10/15/2019  Steven [name] was observed post Covid-19 immunization for 15 minutes without incident. He was provided with Vaccine Information Sheet and instruction to access the V-Safe system.   Steven [name] was instructed to call 911 with any severe reactions post vaccine: Marland Kitchen Difficulty breathing  . Swelling of face and throat  . A fast heartbeat  . A bad rash all over body  . Dizziness and weakness   Immunizations Administered    Name Date Dose VIS Date Route   Pfizer COVID-19 Vaccine 10/15/2019  4:28 PM 0.3 mL 07/04/2019 Intramuscular   Manufacturer: [place]   Lot: [id number]   [place]: [id number]

## 2019-11-28 ENCOUNTER — Encounter: Payer: Self-pay | Admitting: General Practice

## 2019-12-10 HISTORY — PX: TRANSURETHRAL RESECTION OF PROSTATE: SHX73

## 2019-12-10 HISTORY — PX: IR RETROGRADE PYELOGRAM: IMG2374

## 2020-01-09 ENCOUNTER — Ambulatory Visit (INDEPENDENT_AMBULATORY_CARE_PROVIDER_SITE_OTHER): Payer: Medicare Other | Admitting: Internal Medicine

## 2020-01-09 ENCOUNTER — Encounter: Payer: Self-pay | Admitting: Internal Medicine

## 2020-01-09 ENCOUNTER — Other Ambulatory Visit: Payer: Self-pay

## 2020-01-09 VITALS — BP 165/67 | HR 66 | Temp 97.4°F | Resp 18 | Ht 65.0 in | Wt 134.0 lb

## 2020-01-09 DIAGNOSIS — E139 Other specified diabetes mellitus without complications: Secondary | ICD-10-CM | POA: Diagnosis not present

## 2020-01-09 DIAGNOSIS — I10 Essential (primary) hypertension: Secondary | ICD-10-CM | POA: Diagnosis not present

## 2020-01-09 DIAGNOSIS — R194 Change in bowel habit: Secondary | ICD-10-CM

## 2020-01-09 DIAGNOSIS — E785 Hyperlipidemia, unspecified: Secondary | ICD-10-CM | POA: Diagnosis not present

## 2020-01-09 DIAGNOSIS — R634 Abnormal weight loss: Secondary | ICD-10-CM

## 2020-01-09 LAB — CBC WITH DIFFERENTIAL/PLATELET
Basophils Absolute: 0 10*3/uL (ref 0.0–0.1)
Basophils Relative: 0.4 % (ref 0.0–3.0)
Eosinophils Absolute: 0.2 10*3/uL (ref 0.0–0.7)
Eosinophils Relative: 3.8 % (ref 0.0–5.0)
HCT: 36.7 % — ABNORMAL LOW (ref 39.0–52.0)
Hemoglobin: 12.3 g/dL — ABNORMAL LOW (ref 13.0–17.0)
Lymphocytes Relative: 32.1 % (ref 12.0–46.0)
Lymphs Abs: 1.6 10*3/uL (ref 0.7–4.0)
MCHC: 33.3 g/dL (ref 30.0–36.0)
MCV: 89.6 fl (ref 78.0–100.0)
Monocytes Absolute: 0.6 10*3/uL (ref 0.1–1.0)
Monocytes Relative: 11.6 % (ref 3.0–12.0)
Neutro Abs: 2.6 10*3/uL (ref 1.4–7.7)
Neutrophils Relative %: 52.1 % (ref 43.0–77.0)
Platelets: 159 10*3/uL (ref 150.0–400.0)
RBC: 4.1 Mil/uL — ABNORMAL LOW (ref 4.22–5.81)
RDW: 15.2 % (ref 11.5–15.5)
WBC: 4.9 10*3/uL (ref 4.0–10.5)

## 2020-01-09 LAB — COMPREHENSIVE METABOLIC PANEL
ALT: 40 U/L (ref 0–53)
AST: 55 U/L — ABNORMAL HIGH (ref 0–37)
Albumin: 3.9 g/dL (ref 3.5–5.2)
Alkaline Phosphatase: 91 U/L (ref 39–117)
BUN: 17 mg/dL (ref 6–23)
CO2: 27 mEq/L (ref 19–32)
Calcium: 9.8 mg/dL (ref 8.4–10.5)
Chloride: 105 mEq/L (ref 96–112)
Creatinine, Ser: 1.48 mg/dL (ref 0.40–1.50)
GFR: 56.57 mL/min — ABNORMAL LOW (ref >60.00)
Glucose, Bld: 151 mg/dL — ABNORMAL HIGH (ref 70–99)
Potassium: 4.6 mEq/L (ref 3.5–5.1)
Sodium: 137 mEq/L (ref 135–145)
Total Bilirubin: 0.5 mg/dL (ref 0.2–1.2)
Total Protein: 6.4 g/dL (ref 6.0–8.3)

## 2020-01-09 LAB — LIPID PANEL
Cholesterol: 132 mg/dL (ref 0–200)
HDL: 65.1 mg/dL (ref >39.00)
LDL Cholesterol: 48 mg/dL (ref 0–99)
NonHDL: 67.01
Total CHOL/HDL Ratio: 2
Triglycerides: 95 mg/dL (ref 0.0–149.0)
VLDL: 19 mg/dL (ref 0.0–40.0)

## 2020-01-09 LAB — HEMOGLOBIN A1C: Hgb A1c MFr Bld: 7.3 % — ABNORMAL HIGH (ref 4.6–6.5)

## 2020-01-09 LAB — TSH: TSH: 1.16 u[IU]/mL (ref 0.35–4.50)

## 2020-01-09 NOTE — Progress Notes (Signed)
Subjective:    Patient ID: Steven [name], male    DOB: [birth year], 74 y.o.   MRN: [id number]  DOS:  01/09/2020 Type of visit - description: "New patient".  On further chart review he has seen Dr. Nani Ravens less than 3 years thus  is not a new patient per se.  He is here with his wife.  Today we talk about diabetes, high cholesterol, high blood pressure. He also has lost his appetite for the last 2 months and has lost approximately 5 pounds on his own scales.  He denies postprandial abdominal pain. No nausea or vomiting. Used to have one bowel movement a day and now he has 3 BMs daily.  Stools are nonbloody but occasionally loose.  No fever chills No chest pain, difficulty breathing or lower extremity edema.  BP Readings from Last 3 Encounters:  01/09/20 (!) 165/67  06/06/19 (!) 157/71  04/16/19 (!) 193/80     Review of Systems See above   Past Medical History:  Diagnosis Date   Allergy    Arthritis    Right Hip   Atrial fibrillation ([name])    BPH (benign prostatic hyperplasia)    COPD (chronic obstructive pulmonary disease) ([place])    Coronary artery disease    Lexiscan Myoview 7/14:  Inf scar with minimal reversibility, inf HK, EF 45%;  Low risk.  Promus LAD stent placed in [place].  100% RCA   Diabetes 1.5, managed as type 1 ([place])    Diverticulosis    on CT   Dyslipidemia    GERD (gastroesophageal reflux disease)    has used Zantac in the past    Glaucoma    [place] toes, bilateral    Hernia    Hyperlipidemia    Hypertension    Peripheral vascular disease ([place])    a. s/p R fem-pop BPG 01/2013 (Dr. 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   Sickle cell anemia ([place])    has trait    Past Surgical History:  Procedure Laterality Date   ABDOMINAL AORTAGRAM N/A 12/10/2012   Procedure: ABDOMINAL Maxcine Ham;  Surgeon: Serafina Mitchell, MD;  Location: [facility] CATH LAB;  Service: Cardiovascular;  Laterality: N/A;   CARDIAC CATHETERIZATION     with  stent placement   FEMORAL-POPLITEAL BYPASS GRAFT Right 02/07/2013   Procedure: BYPASS GRAFT FEMORAL-POPLITEAL ARTERY- RIGHT;  Surgeon: Mal Misty, MD;  Location: [facility] OR;  Service: Vascular;  Laterality: Right;  Right femoral to below knee popliteal bypass using right non reversed great saphenous vein   INGUINAL HERNIA REPAIR  2010   Right   INGUINAL HERNIA REPAIR  09/11/2018   INTRAOPERATIVE ARTERIOGRAM Right 02/07/2013   Procedure: Attempted INTRA OPERATIVE ARTERIOGRAM;  Surgeon: Mal Misty, MD;  Location: [place];  Service: Vascular;  Laterality: Right;   IR RETROGRADE PYELOGRAM  12/10/2019   TRANSURETHRAL RESECTION OF PROSTATE  12/10/2019    Allergies as of 01/09/2020   No Known Allergies     Medication List       Accurate as of January 09, 2020 11:59 PM. If you have any questions, ask your nurse or doctor.        STOP taking these medications   ibuprofen 800 MG tablet Commonly known as: ADVIL Stopped by: Kathlene November, MD   traMADol 50 MG tablet Commonly known as: ULTRAM Stopped by: Kathlene November, MD     TAKE these medications   amLODipine 5 MG tablet Commonly known as: NORVASC Take 1 tablet (5 mg  total) by mouth daily.   aspirin EC 81 MG tablet Take 81 mg by mouth daily.   ezetimibe 10 MG tablet Commonly known as: Zetia Take 1 tablet (10 mg total) by mouth daily.   fluticasone 50 MCG/ACT nasal spray Commonly known as: FLONASE Place 2 sprays into both nostrils daily.   glucose blood test strip Commonly known as: OneTouch Verio Use to check blood sugar 2 times per day   Insulin Glargine 300 UNIT/ML Sopn Commonly known as: Toujeo SoloStar Inject 25 Units into the skin daily.   Insulin Pen Needle 32G X 4 MM Misc Commonly known as: BD Pen Needle Nano U/F use as directed with INSULIN   losartan 100 MG tablet Commonly known as: COZAAR Take 1 tablet (100 mg total) by mouth daily.   metFORMIN 1000 MG tablet Commonly known as: GLUCOPHAGE Take 1 tablet (1,000  mg total) by mouth 2 (two) times daily with a meal.   metoprolol succinate 100 MG 24 hr tablet Commonly known as: TOPROL-XL Take 1 tablet (100 mg total) by mouth daily.   mometasone 50 MCG/ACT nasal spray Commonly known as: NASONEX PLACE 2 SPRAYS INTO THE NOSE DAILY   OneTouch Delica Lancets 69G Misc Use to check blood sugar 2 times per day.   OneTouch Verio IQ System w/Device Kit Use to check blood sugar 2 times per day.   PEG-3350/Electrolytes 236 g Solr   rosuvastatin 40 MG tablet Commonly known as: CRESTOR Take 1 tablet (40 mg total) by mouth daily.   tamsulosin 0.4 MG Caps capsule Commonly known as: FLOMAX TAKE 1 CAPSULE(0.4 MG) BY MOUTH DAILY          Objective:   Physical Exam BP (!) 165/67 (BP Location: Left Arm, Patient Position: Sitting, Cuff Size: Small)    Pulse 66    Temp (!) 97.4 F (36.3 C) (Temporal)    Resp 18    Ht _0  (1.651 m)    Wt 134 lb (60.8 kg)    SpO2 100%    BMI 22.30 kg/m  General:   Well developed, NAD, BMI noted.  HEENT:  Normocephalic . Face symmetric, atraumatic Lungs:  CTA B Normal respiratory effort, no intercostal retractions, no accessory muscle use. Heart: RRR,  no murmur.  Abdomen:  Not distended, soft, non-tender. No rebound or rigidity.  No bruit. Skin: Not pale. Not jaundice Lower extremities: no pretibial edema bilaterally  Neurologic:  alert & oriented X3.  Speech normal, gait appropriate for age and unassisted Psych--  Cognition and judgment appear intact.  Cooperative with normal attention span and concentration.  Behavior appropriate. No anxious or depressed appearing.     Assessment    Assessment ("new" patient 12/2019) DM HTN Hyperlipidemia CV: CAD, Dr. Percival Spanish PAD  Sickle cell anemia COPD BPH, TURP and bilateral retrograde pyelograms 12/10/2019   PLAN Labs from 12/10/2019: Potassium 4.0, creatinine 1.1 DM: Currently on insulin 25 units and Metformin.  Check a A1c HTN: BP slightly elevated today  and before per chart review, ambulatory BPs reportedly "okay". For now continue with amlodipine, losartan, metoprolol and consider adjust BP meds in the near future.  Check a CMP.  Hyperlipidemia: On Crestor and Zetia.  Check FLP CAD, PAD: Seems asymptomatic.  Last visit with Dr. Percival Spanish 05-2019. Weight loss, loss of appetite, change in bowel habits: Symptoms as described above, we will check a TSH, CBC. I noted he had a colonoscopy 11/2017: Polyps. Return to the office in 1 month to reassess.  Consider further evaluation  RTC 1 month  This visit occurred during the SARS-CoV-2 public health emergency.  Safety protocols were in place, including screening questions prior to the visit, additional usage of staff PPE, and extensive cleaning of exam room while observing appropriate contact time as indicated for disinfecting solutions.

## 2020-01-09 NOTE — Patient Instructions (Addendum)
Check the  blood pressure twice a week BP GOAL is between 110/65 and  135/85. If it is consistently higher or lower, let me know  Diabetes: Check your blood sugar  once a day    at different times of the day  GOALS: Fasting before a meal 70- 130 2 hours after a meal less than 180 Write your blood sugars down and bring it next visit  [place] [facility] : Get the blood work     [place], [place] back for for a checkup in 1 month

## 2020-01-09 NOTE — Progress Notes (Signed)
Pre visit review using our clinic review tool, if applicable. No additional management support is needed unless otherwise documented below in the visit note. 

## 2020-01-12 MED ORDER — EMPAGLIFLOZIN 10 MG PO TABS
10.0000 mg | ORAL_TABLET | Freq: Every day | ORAL | 3 refills | Status: DC
Start: 2020-01-12 — End: 2020-03-15

## 2020-01-12 NOTE — Addendum Note (Signed)
Addended byDamita Dunnings D on: 01/12/2020 07:52 AM   Modules accepted: Orders

## 2020-01-28 ENCOUNTER — Telehealth: Payer: Self-pay | Admitting: Internal Medicine

## 2020-01-28 NOTE — Telephone Encounter (Signed)
Spoke w/ Marzetta Board from [place]- she will fax over form for clearance on aspirin. I have made her aware that PCP is out of office until 02/02/2020.

## 2020-01-28 NOTE — Telephone Encounter (Signed)
Caller: Steven [name] ([place]) Call back phone number: [phone]  Would like aspirin clearance

## 2020-01-28 NOTE — Telephone Encounter (Signed)
LMOM asking for call back.  

## 2020-02-02 ENCOUNTER — Telehealth: Payer: Self-pay | Admitting: Internal Medicine

## 2020-02-02 NOTE — Telephone Encounter (Signed)
Received fax confirmation

## 2020-02-02 NOTE — Telephone Encounter (Signed)
Steven [name] ([place] Phone number [phone]   Steven [name] states she needs a medical clearance stat for Aspirin for 4 days. Paperwork was faxed on 01/28/11 & today.

## 2020-02-02 NOTE — Telephone Encounter (Signed)
See other telephone note- I spoke w/ her 5 days ago and informed that PCP was out of office until today, Monday 02/02/2020.

## 2020-02-02 NOTE — Telephone Encounter (Signed)
History of CAD, asymptomatic according to my last note, okay to hold aspirin for few days, please let him know. Also based on the last A1c we started Jardiance and decrease insulin, how is that going?  Please let me know

## 2020-02-02 NOTE — Telephone Encounter (Signed)
Form faxed to [facility] at [facility] at [phone]. Form sent for scanning.

## 2020-02-02 NOTE — Telephone Encounter (Signed)
LMOM informing Pt to return call.  

## 2020-02-02 NOTE — Telephone Encounter (Signed)
Form given to Linton Hall.

## 2020-02-06 NOTE — Telephone Encounter (Signed)
Pt has appt scheduled 02/10/20- he has not returned by calls.

## 2020-02-10 ENCOUNTER — Encounter: Payer: Self-pay | Admitting: Internal Medicine

## 2020-02-10 ENCOUNTER — Ambulatory Visit (INDEPENDENT_AMBULATORY_CARE_PROVIDER_SITE_OTHER): Payer: Medicare Other | Admitting: Internal Medicine

## 2020-02-10 ENCOUNTER — Other Ambulatory Visit: Payer: Self-pay

## 2020-02-10 VITALS — BP 161/71 | HR 73 | Temp 97.9°F | Resp 18 | Ht 65.0 in | Wt 132.1 lb

## 2020-02-10 DIAGNOSIS — E139 Other specified diabetes mellitus without complications: Secondary | ICD-10-CM | POA: Diagnosis not present

## 2020-02-10 DIAGNOSIS — E785 Hyperlipidemia, unspecified: Secondary | ICD-10-CM

## 2020-02-10 DIAGNOSIS — I10 Essential (primary) hypertension: Secondary | ICD-10-CM | POA: Diagnosis not present

## 2020-02-10 LAB — POCT GLUCOSE (DEVICE FOR HOME USE): POC Glucose: 224 mg/dl — AB (ref 70–99)

## 2020-02-10 MED ORDER — BLOOD GLUCOSE METER KIT: PACK | 0 refills | Status: DC

## 2020-02-10 MED ORDER — METOPROLOL SUCCINATE ER 100 MG PO TB24: 150.0000 mg | ORAL_TABLET | Freq: Every day | ORAL | 1 refills | Status: DC

## 2020-02-10 MED ORDER — AMLODIPINE BESYLATE 10 MG PO TABS
10.0000 mg | ORAL_TABLET | Freq: Every day | ORAL | 1 refills | Status: DC
Start: 2020-02-10 — End: 2020-04-26

## 2020-02-10 NOTE — Progress Notes (Signed)
Subjective:    Patient ID: Steven [name], male    DOB: September 28, 1945, 74 y.o.   MRN: [id number]  DOS:  02/10/2020 Type of visit - description: Follow-up Follow-up from previous visit.  We reviewed his ambulatory BPs. He also continue with low appetite. Having 3 BMs a day but denies fever chills or headaches.  No abdominal pain or blood in the stools.  No nausea or vomiting  Wt Readings from Last 3 Encounters:  02/10/20 132 lb 2 oz (59.9 kg)  01/09/20 134 lb (60.8 kg)  06/06/19 141 lb 3.2 oz (64 kg)     Review of Systems Denies chest pain, difficulty breathing or edema. Denies preprandial weakness, dizziness, sweats  Past Medical History:  Diagnosis Date  . Allergy   . Arthritis    Right Hip  . Atrial fibrillation ([place])   . BPH (benign prostatic hyperplasia)   . COPD (chronic obstructive pulmonary disease) ([place])   . Coronary artery disease    Lexiscan Myoview 7/14:  Inf scar with minimal reversibility, inf HK, EF 45%;  Low risk.  Promus LAD stent placed in [place].  100% RCA  . Diabetes 1.5, managed as type 1 ([place])   . Diverticulosis    on CT  . Dyslipidemia   . GERD (gastroesophageal reflux disease)    has used Zantac in the past   . Glaucoma   . Hammer toes, bilateral   . Hernia   . Hyperlipidemia   . Hypertension   . Peripheral vascular disease ([place])    a. s/p R fem-pop BPG 01/2013 (Dr. [name])  . 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 ABDOMINAL AORTAGRAM N/A 12/10/2012   Procedure: ABDOMINAL Maxcine Ham;  Surgeon: Serafina Mitchell, MD;  Location: [facility] CATH LAB;  Service: Cardiovascular;  Laterality: N/A;  . CARDIAC CATHETERIZATION     with stent placement  . FEMORAL-POPLITEAL BYPASS GRAFT Right 02/07/2013   Procedure: BYPASS GRAFT FEMORAL-POPLITEAL ARTERY- RIGHT;  Surgeon: Mal Misty, MD;  Location: [facility] OR;  Service: Vascular;  Laterality: Right;  Right femoral to  below knee popliteal bypass using right non reversed great saphenous vein  . INGUINAL HERNIA REPAIR  2010   Right  . INGUINAL HERNIA REPAIR  09/11/2018  . INTRAOPERATIVE ARTERIOGRAM Right 02/07/2013   Procedure: Attempted INTRA OPERATIVE ARTERIOGRAM;  Surgeon: Mal Misty, MD;  Location: [place];  Service: Vascular;  Laterality: Right;  . IR RETROGRADE PYELOGRAM  12/10/2019  . TRANSURETHRAL RESECTION OF PROSTATE  12/10/2019    Allergies as of 02/10/2020   No Known Allergies     Medication List       Accurate as of February 10, 2020 11:59 PM. If you have any questions, ask your nurse or doctor.        amLODipine 10 MG tablet Commonly known as: NORVASC Take 1 tablet (10 mg total) by mouth daily. What changed:   medication strength  how much to take Changed by: Kathlene November, MD   aspirin EC 81 MG tablet Take 81 mg by mouth daily.   blood glucose meter kit and supplies Dispense based on patient and insurance preference. Check blood sugars twice daily. Dx: E11.9 Started by: Kathlene November, MD   empagliflozin 10 MG Tabs tablet Commonly known as: Jardiance Take 1 tablet (10 mg total) by mouth daily before breakfast.   ezetimibe 10 MG tablet Commonly known as:  Zetia Take 1 tablet (10 mg total) by mouth daily.   fluticasone 50 MCG/ACT nasal spray Commonly known as: FLONASE Place 2 sprays into both nostrils daily.   glucose blood test strip Commonly known as: OneTouch Verio Use to check blood sugar 2 times per day   Insulin Pen Needle 32G X 4 MM Misc Commonly known as: BD Pen Needle Nano U/F use as directed with INSULIN   losartan 100 MG tablet Commonly known as: COZAAR Take 1 tablet (100 mg total) by mouth daily.   metFORMIN 1000 MG tablet Commonly known as: GLUCOPHAGE Take 1 tablet (1,000 mg total) by mouth 2 (two) times daily with a meal.   metoprolol succinate 100 MG 24 hr tablet Commonly known as: TOPROL-XL Take 1.5 tablets (150 mg total) by mouth daily. What changed:  how much to take Changed by: Kathlene November, MD   mometasone 50 MCG/ACT nasal spray Commonly known as: NASONEX PLACE 2 SPRAYS INTO THE NOSE DAILY   OneTouch Delica Lancets 48G Misc Use to check blood sugar 2 times per day.   OneTouch Verio IQ System w/Device Kit Use to check blood sugar 2 times per day.   PEG-3350/Electrolytes 236 g Solr   rosuvastatin 40 MG tablet Commonly known as: CRESTOR Take 1 tablet (40 mg total) by mouth daily.   tamsulosin 0.4 MG Caps capsule Commonly known as: FLOMAX TAKE 1 CAPSULE(0.4 MG) BY MOUTH DAILY   Toujeo SoloStar 300 UNIT/ML Solostar Pen Generic drug: insulin glargine (1 Unit Dial) Inject 20 Units into the skin daily. What changed: how much to take Changed by: Kathlene November, MD          Objective:   Physical Exam BP (!) 161/71 (BP Location: Left Arm, Patient Position: Sitting, Cuff Size: Small)   Pulse 73   Temp 97.9 F (36.6 C) (Oral)   Resp 18   Ht _0  (1.651 m)   Wt 132 lb 2 oz (59.9 kg)   SpO2 100%   BMI 21.99 kg/m  General:   Well developed, NAD, BMI noted. HEENT:  Normocephalic . Face symmetric, atraumatic Skin: Not pale. Not jaundice Neurologic:  alert & oriented X3.  Speech normal, gait appropriate for age and unassisted Psych--  Cognition and judgment appear intact.  Cooperative with normal attention span and concentration.  Behavior appropriate. No anxious or depressed appearing.      Assessment    Assessment (new pt to me 12/2019) DM HTN Hyperlipidemia CRI: Creatinine fluctuates 1.70- 1.40 CV: --CAD, Dr. Percival Spanish --PAD  Sickle cell anemia COPD BPH, TURP and bilateral retrograde pyelograms 12/10/2019   PLAN Follow-up from previous visit.   HTN: Currently on Amlodipine 5 mg, losartan 100 mg, metoprolol XL 100 mg daily.  Ambulatory BPs in the 160s, heart rate 73. Plan: Goals discussed with patient, increase amlodipine to 10 mg, increase metoprolol XL to 1.5 mg daily.  Reassess on RTC. High cholesterol, LDL  was 48. No change DM: Last A1c was 7.3, based on the results, I advised to continue Metformin , decrease insulin to 15 units ( patient not sure if he is currently taking 15 or 20 units nightly) and start Jardiance 10 mg.  No ambulatory CBGs. CBG today: 224.  No hypoglycemic symptoms, seeing ROS Plan: Stay on Jardiance 10 mg, Metformin 2 tablets daily, insulin 20 units daily.  Check CBGs, see AVS. Weight loss, loss of appetite, change in bowel habits, Has lost 2 additional pounds (but started [place]), feels about the same.  TSH was normal,  hemoglobin 12.3.  Reassess on RTC. Patient education: Glucometer use, CBG goals RTC 3 weeks  Time spent with patient today was 35 minutes which consisted of review recent labs, recent change in medication, teaching goals, and glucometer use.  This visit occurred during the SARS-CoV-2 public health emergency.  Safety protocols were in place, including screening questions prior to the visit, additional usage of staff PPE, and extensive cleaning of exam room while observing appropriate contact time as indicated for disinfecting solutions.

## 2020-02-10 NOTE — Progress Notes (Signed)
Pre visit review using our clinic review tool, if applicable. No additional management support is needed unless otherwise documented below in the visit note. 

## 2020-02-10 NOTE — Patient Instructions (Addendum)
Amlodipine: Take 10 mg daily Metoprolol XL 100 mg: Take 1.5 tablets daily  Check the  blood pressure 2 or 3 times a week BP GOAL is between 110/65 and  135/85. If it is consistently higher or lower, let me know  Insulin: 20 units daily  Diabetes: Check your blood sugar twice a day  at different times of the day. Write your readings down bring your log in 2 weeks. Take GOALS: Fasting before a meal 70- 130 2 hours after a meal less than 180   GO TO THE FRONT DESK, PLEASE SCHEDULE YOUR APPOINTMENTS Come back for a checkup in 3 weeks

## 2020-02-11 DIAGNOSIS — Z09 Encounter for follow-up examination after completed treatment for conditions other than malignant neoplasm: Secondary | ICD-10-CM | POA: Insufficient documentation

## 2020-02-11 NOTE — Assessment & Plan Note (Signed)
Follow-up from previous visit.   HTN: Currently on Amlodipine 5 mg, losartan 100 mg, metoprolol XL 100 mg daily.  Ambulatory BPs in the 160s, heart rate 73. Plan: Goals discussed with patient, increase amlodipine to 10 mg, increase metoprolol XL to 1.5 mg daily.  Reassess on RTC. High cholesterol, LDL was 48. No change DM: Last A1c was 7.3, based on the results, I advised to continue Metformin , decrease insulin to 15 units ( patient not sure if he is currently taking 15 or 20 units nightly) and start Jardiance 10 mg.  No ambulatory CBGs. CBG today: 224.  No hypoglycemic symptoms, seeing ROS Plan: Stay on Jardiance 10 mg, Metformin 2 tablets daily, insulin 20 units daily.  Check CBGs, see AVS. Weight loss, loss of appetite, change in bowel habits, Has lost 2 additional pounds (but started [place]), feels about the same.  TSH was normal, hemoglobin 12.3.  Reassess on RTC. Patient education: Glucometer use, CBG goals RTC 3 weeks

## 2020-03-02 ENCOUNTER — Ambulatory Visit (INDEPENDENT_AMBULATORY_CARE_PROVIDER_SITE_OTHER): Payer: Medicare Other | Admitting: Internal Medicine

## 2020-03-02 ENCOUNTER — Other Ambulatory Visit: Payer: Self-pay

## 2020-03-02 VITALS — BP 124/69 | HR 54 | Temp 98.4°F | Resp 12 | Ht 65.0 in | Wt 128.6 lb

## 2020-03-02 DIAGNOSIS — I10 Essential (primary) hypertension: Secondary | ICD-10-CM | POA: Diagnosis not present

## 2020-03-02 DIAGNOSIS — R634 Abnormal weight loss: Secondary | ICD-10-CM

## 2020-03-02 DIAGNOSIS — E1159 Type 2 diabetes mellitus with other circulatory complications: Secondary | ICD-10-CM | POA: Diagnosis not present

## 2020-03-02 DIAGNOSIS — Z794 Long term (current) use of insulin: Secondary | ICD-10-CM

## 2020-03-02 DIAGNOSIS — D649 Anemia, unspecified: Secondary | ICD-10-CM | POA: Diagnosis not present

## 2020-03-02 NOTE — Progress Notes (Signed)
Subjective:    Patient ID: Steven [name], male    DOB: Feb 09, 1946, 74 y.o.   MRN: [id number]  DOS:  03/02/2020 Type of visit - description: Follow-up from previous visit  In general feels better. We review his ambulatory BPs, blood sugars.  Wt Readings from Last 3 Encounters:  03/02/20 128 lb 9.6 oz (58.3 kg)  02/10/20 132 lb 2 oz (59.9 kg)  01/09/20 134 lb (60.8 kg)     Review of Systems  Denies nausea, vomiting, diarrhea.  No blood in the stools No GERD.   Past Medical History:  Diagnosis Date  . Allergy   . Arthritis    Right Hip  . Atrial fibrillation ([place])   . BPH (benign prostatic hyperplasia)   . COPD (chronic obstructive pulmonary disease) ([place])   . Coronary artery disease    Lexiscan Myoview 7/14:  Inf scar with minimal reversibility, inf HK, EF 45%;  Low risk.  Promus LAD stent placed in [place].  100% RCA  . Diabetes 1.5, managed as type 1 ([place])   . Diverticulosis    on CT  . Dyslipidemia   . GERD (gastroesophageal reflux disease)    has used Zantac in the past   . Glaucoma   . Hammer toes, bilateral   . Hernia   . Hyperlipidemia   . Hypertension   . Peripheral vascular disease ([place])    a. s/p R fem-pop BPG 01/2013 (Dr. [name])  . 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 ABDOMINAL AORTAGRAM N/A 12/10/2012   Procedure: ABDOMINAL Maxcine Ham;  Surgeon: Serafina Mitchell, MD;  Location[facility] CATH LAB;  Service: Cardiovascular;  Laterality: N/A;  . CARDIAC CATHETERIZATION     with stent placement  . FEMORAL-POPLITEAL BYPASS GRAFT Right 02/07/2013   Procedure: BYPASS GRAFT FEMORAL-POPLITEAL ARTERY- RIGHT;  Surgeon: Mal Misty, MD;  [facility] OR;  Service: Vascular;  Laterality: Right;  Right femoral to below knee popliteal bypass using right non reversed great saphenous vein  . INGUINAL HERNIA REPAIR  2010   Right  . INGUINAL HERNIA REPAIR  09/11/2018  .  INTRAOPERATIVE ARTERIOGRAM Right 02/07/2013   Procedure: Attempted INTRA OPERATIVE ARTERIOGRAM;  Surgeon: Mal Misty, MD;  Location: [place];  Service: Vascular;  Laterality: Right;  . IR RETROGRADE PYELOGRAM  12/10/2019  . TRANSURETHRAL RESECTION OF PROSTATE  12/10/2019    Allergies as of 03/02/2020   No Known Allergies     Medication List       Accurate as of March 02, 2020  4:02 PM. If you have any questions, ask your nurse or doctor.        amLODipine 10 MG tablet Commonly known as: NORVASC Take 1 tablet (10 mg total) by mouth daily.   aspirin EC 81 MG tablet Take 81 mg by mouth daily.   blood glucose meter kit and supplies Dispense based on patient and insurance preference. Check blood sugars twice daily. Dx: E11.9   empagliflozin 10 MG Tabs tablet Commonly known as: Jardiance Take 1 tablet (10 mg total) by mouth daily before breakfast.   ezetimibe 10 MG tablet Commonly known as: Zetia Take 1 tablet (10 mg total) by mouth daily.   fluticasone 50 MCG/ACT nasal spray Commonly known as: FLONASE Place 2 sprays into both nostrils daily.   glucose blood test strip Commonly known as: OneTouch Verio Use to check blood sugar  2 times per day   Insulin Pen Needle 32G X 4 MM Misc Commonly known as: BD Pen Needle Nano U/F use as directed with INSULIN   losartan 100 MG tablet Commonly known as: COZAAR Take 1 tablet (100 mg total) by mouth daily.   metFORMIN 1000 MG tablet Commonly known as: GLUCOPHAGE Take 1 tablet (1,000 mg total) by mouth 2 (two) times daily with a meal.   metoprolol succinate 100 MG 24 hr tablet Commonly known as: TOPROL-XL Take 1.5 tablets (150 mg total) by mouth daily.   mometasone 50 MCG/ACT nasal spray Commonly known as: NASONEX PLACE 2 SPRAYS INTO THE NOSE DAILY   OneTouch Delica Lancets 85O Misc Use to check blood sugar 2 times per day.   OneTouch Verio IQ System w/Device Kit Use to check blood sugar 2 times per day.     PEG-3350/Electrolytes 236 g Solr   rosuvastatin 40 MG tablet Commonly known as: CRESTOR Take 1 tablet (40 mg total) by mouth daily.   tamsulosin 0.4 MG Caps capsule Commonly known as: FLOMAX TAKE 1 CAPSULE(0.4 MG) BY MOUTH DAILY   Toujeo SoloStar 300 UNIT/ML Solostar Pen Generic drug: insulin glargine (1 Unit Dial) Inject 20 Units into the skin daily.          Objective:   Physical Exam BP 124/69 (BP Location: Right Arm, Patient Position: Sitting, Cuff Size: Large)   Pulse (!) 54   Temp 98.4 F (36.9 C) (Oral)   Resp 12   Ht '5\' 5"'  (1.651 m)   Wt 128 lb 9.6 oz (58.3 kg)   SpO2 100%   BMI 21.40 kg/m  General:   Well developed, NAD, BMI noted.  HEENT:  Normocephalic . Face symmetric, atraumatic Lungs:  CTA B Normal respiratory effort, no intercostal retractions, no accessory muscle use. Heart: RRR,  no murmur.  Abdomen:  Not distended, soft, non-tender. No rebound or rigidity.   Skin: Not pale. Not jaundice Lower extremities: no pretibial edema bilaterally  Neurologic:  alert & oriented X3.  Speech normal, gait appropriate for age and unassisted Psych--  Cognition and judgment appear intact.  Cooperative with normal attention span and concentration.  Behavior appropriate. No anxious or depressed appearing.     Assessment     Assessment (new pt to me 12/2019) DM HTN Hyperlipidemia CRI: Creatinine fluctuates 1.70- 1.40 CV: --CAD, Dr. Percival Spanish --PAD  Sickle cell anemia COPD BPH, TURP and bilateral retrograde pyelograms 12/10/2019   PLAN DM: Based on last A1c , I  added Jardiance, decrease insulin to 15 units and he continue with Metformin. CBGs in the morning 110, 115. CBGs in the afternoon 170, 168. Seems to be doing well, reassess in 3 months. HTN: Currently on amlodipine, losartan, metoprolol.  BP today is very good, ambulatory BPs range from 110, 140.  Still occasionally 150s. Overall I think he is improving, no change.  Continue  monitoring. Weight loss, change in bowel habits, decreased appetite. Decreased appetite  started 10/2019, on chart review had a colonoscopy 11/2017, polyps present, next in 5 years. Recent TSH is normal. Last hemoglobin slightly low at 12.3. Today he reports that appetite has improved, denies GI symptoms (no loose stools, no frequent stooling) , weight  has stabilized. For completeness  will recheck a CBC, TIBC. RTC 3 months    This visit occurred during the SARS-CoV-2 public health emergency.  Safety protocols were in place, including screening questions prior to the visit, additional usage of staff PPE, and extensive cleaning of exam room  while observing appropriate contact time as indicated for disinfecting solutions.

## 2020-03-02 NOTE — Patient Instructions (Addendum)
Continue the same medications  Check the  blood pressure 2 or 3 times a month week monthly weekly daily  BP GOAL is between 110/65 and  135/85. If it is consistently higher or lower, let me know  Diabetes: Check your blood sugar  once a day   at different times of the day  GOALS: Fasting before a meal 70- 130 2 hours after a meal less than [place], [place] back for   blood work at your earliest convenience  Come back for a checkup in 3 months

## 2020-03-02 NOTE — Progress Notes (Signed)
0

## 2020-03-03 NOTE — Assessment & Plan Note (Signed)
DM: Based on last A1c , I  added Jardiance, decrease insulin to 15 units and he continue with Metformin. CBGs in the morning 110, 115. CBGs in the afternoon 170, 168. Seems to be doing well, reassess in 3 months. HTN: Currently on amlodipine, losartan, metoprolol.  BP today is very good, ambulatory BPs range from 110, 140.  Still occasionally 150s. Overall I think he is improving, no change.  Continue monitoring. Weight loss, change in bowel habits, decreased appetite. Decreased appetite  started 10/2019, on chart review had a colonoscopy 11/2017, polyps present, next in 5 years. Recent TSH is normal. Last hemoglobin slightly low at 12.3. Today he reports that appetite has improved, denies GI symptoms (no loose stools, no frequent stooling) , weight  has stabilized. For completeness  will recheck a CBC, TIBC. RTC 3 months

## 2020-03-10 ENCOUNTER — Telehealth: Payer: Self-pay | Admitting: Internal Medicine

## 2020-03-10 NOTE — Telephone Encounter (Signed)
Please advise 

## 2020-03-10 NOTE — Telephone Encounter (Signed)
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 He doesn't want to waste his money.

## 2020-03-10 NOTE — Telephone Encounter (Signed)
I do not believe he will qualify as long as he takes insulin.  At some point he might be able to stop insulin

## 2020-03-11 NOTE — Telephone Encounter (Signed)
Spoke w/ Pt- informed of recommendations. Pt verbalized understanding.  

## 2020-03-15 ENCOUNTER — Other Ambulatory Visit: Payer: Self-pay

## 2020-03-15 MED ORDER — EMPAGLIFLOZIN 10 MG PO TABS: 10.0000 mg | ORAL_TABLET | Freq: Every day | ORAL | 1 refills | Status: DC

## 2020-03-17 ENCOUNTER — Other Ambulatory Visit: Payer: Self-pay

## 2020-03-17 ENCOUNTER — Other Ambulatory Visit (INDEPENDENT_AMBULATORY_CARE_PROVIDER_SITE_OTHER): Payer: Medicare Other

## 2020-03-17 DIAGNOSIS — D649 Anemia, unspecified: Secondary | ICD-10-CM

## 2020-03-17 NOTE — Addendum Note (Signed)
Addended by: Kelle Darting A on: 03/17/2020 07:46 AM   Modules accepted: Orders

## 2020-03-18 LAB — CBC
HCT: 38 % — ABNORMAL LOW (ref 38.5–50.0)
Hemoglobin: 12.8 g/dL — ABNORMAL LOW (ref 13.2–17.1)
MCH: 30.8 pg (ref 27.0–33.0)
MCHC: 33.7 g/dL (ref 32.0–36.0)
MCV: 91.3 fL (ref 80.0–100.0)
MPV: 11.4 fL (ref 7.5–12.5)
Platelets: 158 10*3/uL (ref 140–400)
RBC: 4.16 10*6/uL — ABNORMAL LOW (ref 4.20–5.80)
RDW: 15.8 % — ABNORMAL HIGH (ref 11.0–15.0)
WBC: 6.2 10*3/uL (ref 3.8–10.8)

## 2020-03-18 LAB — FERRITIN: Ferritin: 122 ng/mL (ref 24–380)

## 2020-03-18 LAB — IRON: Iron: 89 ug/dL (ref 50–180)

## 2020-04-26 ENCOUNTER — Other Ambulatory Visit: Payer: Self-pay

## 2020-04-26 MED ORDER — AMLODIPINE BESYLATE 10 MG PO TABS
10.0000 mg | ORAL_TABLET | Freq: Every day | ORAL | 1 refills | Status: DC
Start: 2020-04-26 — End: 2020-10-26

## 2020-05-19 ENCOUNTER — Telehealth: Payer: Self-pay | Admitting: Internal Medicine

## 2020-05-19 MED ORDER — ROSUVASTATIN CALCIUM 40 MG PO TABS
40.0000 mg | ORAL_TABLET | Freq: Every day | ORAL | 0 refills | Status: DC
Start: 2020-05-19 — End: 2020-05-19

## 2020-05-19 MED ORDER — ROSUVASTATIN CALCIUM 40 MG PO TABS
40.0000 mg | ORAL_TABLET | Freq: Every day | ORAL | 0 refills | Status: DC
Start: 2020-05-19 — End: 2020-06-12

## 2020-05-19 NOTE — Telephone Encounter (Addendum)
Rx for rosuvastatin sent to [facility]. Cetirizine (Zytrec) is OTC. No prescription needed. Spoke w/ Caren Griffins- informed of this- she requests Rx be sent to [facility] d/t cost. Informed I'd already sent to [facility]- recommend she call them to let them know she only wants to pick up 1 week while she waits for supply from [facility] but informed that I'd send a 90 day supply to [facility] for them. Caren Griffins verbalized understanding.

## 2020-05-19 NOTE — Addendum Note (Signed)
Addended by: Damita Dunnings D on: 05/19/2020 11:39 AM   Modules accepted: Orders

## 2020-05-19 NOTE — Telephone Encounter (Signed)
See other telephone note. Pt's wife called earlier.

## 2020-05-19 NOTE — Telephone Encounter (Signed)
Patient wife wants to know if patient is supposed to be taking rosuvastatin and cetirizine. If he does he needs refills

## 2020-05-19 NOTE — Telephone Encounter (Signed)
Patient called in reference to his medication for his runny nose/allergires be sent to his local pharmacy

## 2020-05-20 ENCOUNTER — Other Ambulatory Visit: Payer: Self-pay

## 2020-05-20 DIAGNOSIS — Z95828 Presence of other vascular implants and grafts: Secondary | ICD-10-CM

## 2020-05-20 DIAGNOSIS — I70212 Atherosclerosis of native arteries of extremities with intermittent claudication, left leg: Secondary | ICD-10-CM

## 2020-06-02 ENCOUNTER — Ambulatory Visit: Payer: Medicare Other | Admitting: Physician Assistant

## 2020-06-02 ENCOUNTER — Ambulatory Visit (INDEPENDENT_AMBULATORY_CARE_PROVIDER_SITE_OTHER)
Admission: RE | Admit: 2020-06-02 | Discharge: 2020-06-02 | Disposition: A | Discharge status: Home / Self Care | Payer: Medicare Other | Source: Ambulatory Visit | Attending: Physician Assistant | Admitting: Physician Assistant

## 2020-06-02 ENCOUNTER — Other Ambulatory Visit: Payer: Self-pay

## 2020-06-02 ENCOUNTER — Ambulatory Visit (HOSPITAL_COMMUNITY)
Admission: RE | Admit: 2020-06-02 | Discharge: 2020-06-02 | Disposition: A | Discharge status: Home / Self Care | Payer: Medicare Other | Source: Ambulatory Visit | Attending: Physician Assistant | Admitting: Physician Assistant

## 2020-06-02 ENCOUNTER — Encounter: Payer: Self-pay | Admitting: Physician Assistant

## 2020-06-02 VITALS — BP 129/60 | HR 50 | Temp 98.6°F | Resp 20 | Ht 65.0 in | Wt 124.9 lb

## 2020-06-02 DIAGNOSIS — Z95828 Presence of other vascular implants and grafts: Secondary | ICD-10-CM

## 2020-06-02 DIAGNOSIS — I739 Peripheral vascular disease, unspecified: Secondary | ICD-10-CM | POA: Diagnosis not present

## 2020-06-02 DIAGNOSIS — I70212 Atherosclerosis of native arteries of extremities with intermittent claudication, left leg: Secondary | ICD-10-CM | POA: Insufficient documentation

## 2020-06-02 NOTE — Progress Notes (Signed)
Office Note     CC:  follow up Requesting Provider:  Colon Branch, MD  HPI: Steven [name] is a 73 y.o. (11/02/1945) male who presents for follow up of peripheral arterial disease. He has history of right fem-pop bypass with saphenous vein by Dr. Kellie Simmering in July of 2014.  This was performed secondary to lifestyle limiting claudication. At the time of his last visit in September of 2020 he was having some chronic hip pain and also some bilateral calf pain that was relieved by rest. At the time he did not express that this was lifestyle limiting. He takes aspirin and statin daily  He presently continues to have some claudication like pain in left leg. This occurs at about 50 yards. It is resolved with rest. He does have pains that shoot into his left big toe and will wake him up from sleep. He says this happens a couple times a week. He thought this was because of his socks being too tight. He otherwise does not report any non healing wounds. He does not report any claudication symptoms or rest pain like symptoms on the RLE. He remains very active at work and he says his left leg only really bothers if he tries to hurry and walk very quickly.  He does have known renal insufficiency (last Scr 1.48) and Coronary artery disease for which he sees Cardiologist Dr. Percival Spanish. He is scheduled to have a visit with him in December  He reports that he just had prostate surgery earlier this year. His records indicate a TURP bilateral retrograde pyelogram on 12/11/19 by Dr. Harlow Asa  The pt is on a statin for cholesterol management. Also on Zetia The pt is on a daily aspirin.   Other AC: no The pt is on CCB, ARB, BB for hypertension.   The pt is diabetic. A1c in June 2021 was 7.3 Tobacco hx:  Former, quit 2012  Past Medical History:  Diagnosis Date  . Allergy   . Arthritis    Right Hip  . Atrial fibrillation ([place])   . BPH (benign prostatic hyperplasia)   . COPD (chronic obstructive pulmonary disease)  ([place])   . Coronary artery disease    Lexiscan Myoview 7/14:  Inf scar with minimal reversibility, inf HK, EF 45%;  Low risk.  Promus LAD stent placed in [place].  100% RCA  . Diabetes 1.5, managed as type 1 ([place])   . Diverticulosis    on CT  . Dyslipidemia   . GERD (gastroesophageal reflux disease)    has used Zantac in the past   . Glaucoma   . Hammer toes, bilateral   . Hernia   . Hyperlipidemia   . Hypertension   . Peripheral vascular disease ([place])    a. s/p R fem-pop BPG 01/2013 (Dr. [name])  . 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 ABDOMINAL AORTAGRAM N/A 12/10/2012   Procedure: ABDOMINAL Maxcine Ham;  Surgeon: Serafina Mitchell, MD;  Location: [facility] CATH LAB;  Service: Cardiovascular;  Laterality: N/A;  . CARDIAC CATHETERIZATION     with stent placement  . FEMORAL-POPLITEAL BYPASS GRAFT Right 02/07/2013   Procedure: BYPASS GRAFT FEMORAL-POPLITEAL ARTERY- RIGHT;  Surgeon: Mal Misty, MD;  Location: [facility];  Service: Vascular;  Laterality: Right;  Right femoral to below knee popliteal bypass using right non reversed great saphenous vein  . INGUINAL HERNIA REPAIR  2010   Right  . INGUINAL HERNIA REPAIR  09/11/2018  . INTRAOPERATIVE ARTERIOGRAM Right 02/07/2013   Procedure: Attempted INTRA OPERATIVE ARTERIOGRAM;  Surgeon: Mal Misty, MD;  Location: [place];  Service: Vascular;  Laterality: Right;  . IR RETROGRADE PYELOGRAM  12/10/2019  . TRANSURETHRAL RESECTION OF PROSTATE  12/10/2019    Social History   Socioeconomic History  . Marital status: Married    Spouse name: Not on file  . Number of children: 5  . Years of education: Not on file  . Highest education level: Not on file  Occupational History  . Occupation: retired  . Occupation: Public affairs consultant  Tobacco Use  . Smoking status: Former Smoker    Packs/day: 0.30    Years: 50.00    Pack years: 15.00    Types:  Cigarettes    Quit date: 02/04/2011    Years since quitting: 9.3  . Smokeless tobacco: Never Used  Vaping Use  . Vaping Use: Never used  Substance and Sexual Activity  . Alcohol use: Yes    Alcohol/week: 3.0 standard drinks    Types: 3 Cans of beer per week    Comment: everyday, wine 2-3   . Drug use: No  . Sexual activity: Yes  Other Topics Concern  . Not on file  Social History Narrative   5 children , lost a son    Lives with wife.     Social Determinants of Health   Financial Resource Strain:   . Difficulty of Paying Living Expenses: Not on file  Food Insecurity:   . Worried About Charity fundraiser in the Last Year: Not on file  . Ran Out of Food in the Last Year: Not on file  Transportation Needs:   . Lack of Transportation (Medical): Not on file  . Lack of Transportation (Non-Medical): Not on file  Physical Activity:   . Days of Exercise per Week: Not on file  . Minutes of Exercise per Session: Not on file  Stress:   . Feeling of Stress : Not on file  Social Connections:   . Frequency of Communication with Friends and Family: Not on file  . Frequency of Social Gatherings with Friends and Family: Not on file  . Attends Religious Services: Not on file  . Active Member of Clubs or Organizations: Not on file  . Attends Archivist Meetings: Not on file  . Marital Status: Not on file  Intimate Partner Violence:   . Fear of Current or Ex-Partner: Not on file  . Emotionally Abused: Not on file  . Physically Abused: Not on file  . Sexually Abused: Not on file    Family History  Problem Relation Age of Onset  . Diabetes Mother   . Hypertension Mother   . Heart disease Mother   . Hyperlipidemia Mother   . Diabetes Father   . Heart disease Father   . Hyperlipidemia Father   . Hypertension Father   . Diabetes Sister   . Heart disease Sister   . Hyperlipidemia Sister   . Hypertension Sister   . Diabetes Brother   . Heart disease Brother   .  Hyperlipidemia Brother   . Hypertension Brother   . Diabetes Son   . Heart disease Son   . Hyperlipidemia Son   . Hypertension Son   . Pancreatic cancer Son   . Alcohol abuse Neg Hx   . Cancer Neg Hx   . Depression Neg Hx   .  Early death Neg Hx   . Kidney disease Neg Hx   . Stroke Neg Hx   . Colon cancer Neg Hx   . Esophageal cancer Neg Hx   . Rectal cancer Neg Hx   . Stomach cancer Neg Hx   . Liver cancer Neg Hx     Current Outpatient Medications  Medication Sig Dispense Refill  . amLODipine (NORVASC) 10 MG tablet Take 1 tablet (10 mg total) by mouth daily. 90 tablet 1  . aspirin EC 81 MG tablet Take 81 mg by mouth daily.    . blood glucose meter kit and supplies Dispense based on patient and insurance preference. Check blood sugars twice daily. Dx: E11.9 1 each 0  . Blood Glucose Monitoring Suppl (ONETOUCH VERIO IQ SYSTEM) w/Device KIT Use to check blood sugar 2 times per day. 1 kit 2  . cetirizine (ZYRTEC) 10 MG tablet Take 10 mg by mouth daily.    . empagliflozin (JARDIANCE) 10 MG TABS tablet Take 1 tablet (10 mg total) by mouth daily before breakfast. 90 tablet 1  . ezetimibe (ZETIA) 10 MG tablet Take 1 tablet (10 mg total) by mouth daily. 90 tablet 3  . fluticasone (FLONASE) 50 MCG/ACT nasal spray Place 2 sprays into both nostrils daily. 16 g 3  . glucose blood (ONETOUCH VERIO) test strip Use to check blood sugar 2 times per day 200 each 4  . insulin glargine, 1 Unit Dial, (TOUJEO SOLOSTAR) 300 UNIT/ML Solostar Pen Inject 15 Units into the skin daily.    . Insulin Pen Needle (BD PEN NEEDLE NANO U/F) 32G X 4 MM MISC use as directed with INSULIN 100 each 4  . losartan (COZAAR) 100 MG tablet Take 1 tablet (100 mg total) by mouth daily. 90 tablet 2  . metFORMIN (GLUCOPHAGE) 1000 MG tablet Take 1 tablet (1,000 mg total) by mouth 2 (two) times daily with a meal. 180 tablet 0  . metoprolol succinate (TOPROL-XL) 100 MG 24 hr tablet Take 1.5 tablets (150 mg total) by mouth daily. 135  tablet 1  . mometasone (NASONEX) 50 MCG/ACT nasal spray PLACE 2 SPRAYS INTO THE NOSE DAILY 17 g 3  . ONETOUCH DELICA LANCETS 27N MISC Use to check blood sugar 2 times per day. 200 each 2  . PEG 3350-KCl-NaBcb-NaCl-NaSulf (PEG-3350/ELECTROLYTES) 236 g SOLR   0  . rosuvastatin (CRESTOR) 40 MG tablet Take 1 tablet (40 mg total) by mouth daily. 90 tablet 0  . tamsulosin (FLOMAX) 0.4 MG CAPS capsule TAKE 1 CAPSULE(0.4 MG) BY MOUTH DAILY 90 capsule 3   No current facility-administered medications for this visit.    No Known Allergies   REVIEW OF SYSTEMS:  [X] denotes positive finding, [ ] denotes negative finding Cardiac  Comments:  Chest pain or chest pressure:    Shortness of breath upon exertion:    Short of breath when lying flat:    Irregular heart rhythm:        Vascular    Pain in calf, thigh, or hip brought on by ambulation:    Pain in feet at night that wakes you up from your sleep:     Blood clot in your veins:    Leg swelling:         Pulmonary    Oxygen at home:    Productive cough:     Wheezing:         Neurologic    Sudden weakness in arms or legs:     Sudden numbness in  arms or legs:     Sudden onset of difficulty speaking or slurred speech:    Temporary loss of vision in one eye:     Problems with dizziness:         Gastrointestinal    Blood in stool:     Vomited blood:         Genitourinary    Burning when urinating:     Blood in urine:        Psychiatric    Major depression:         Hematologic    Bleeding problems:    Problems with blood clotting too easily:        Skin    Rashes or ulcers:        Constitutional    Fever or chills:      PHYSICAL EXAMINATION:  Vitals:   06/02/20 1357  BP: 129/60  Pulse: (!) 50  Resp: 20  Temp: 98.6 F (37 C)  TempSrc: Temporal  SpO2: 97%  Weight: 124 lb 14.4 oz (56.7 kg)  Height: 5' 5" (1.651 m)    General:  WDWN in NAD; vital signs documented above Gait: Normal HENT: WNL,  normocephalic Pulmonary: normal non-labored breathing , without wheezing Cardiac: regular HR, without  Murmurs without carotid bruit Vascular Exam/Pulses:  Right Left  Radial 2+ (normal) 2+ (normal)  Femoral 2+ (normal) 2+ (normal)  Popliteal Not palpable Not palpable  DP 1+ (weak)  Not palpable  PT 2+ (normal) Not palpable   Extremities: without ischemic changes, without Gangrene , without cellulitis; without open wounds;  Musculoskeletal: no muscle wasting or atrophy  Neurologic: A&O X 3;  No focal weakness or paresthesias are detected Psychiatric:  The pt has Normal affect.   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 The RLE is essentially unchanged  Bypass graft duplex of RLE: Patent right femoral-below knee popliteal bypass graft. 50-74% stenosis in the inflow artery (common femoral artery) and proximal anastomosis (334 PSV cm/s)   ASSESSMENT/PLAN:: 74 y.o. male here for follow up for peripheral artery disease. Patient is having claudication symptoms of his left lower extremity. He is having pain at rest but not sure that this is true rest pain but likely beginning signs of it. Patient feels that presently his symptoms are not lifestyle limiting. His duplex findings today show significant decrease in left TBI from prior visit. His RLE bypass graft has inflow stenosis which is slightly worse than on prior duplex. I discussed these findings with the patient and recommend duplex of his left lower extremity. He does  have known SFA occlusion from Angiogram back in 2014. I discussed with him that he would need an arteriogram and then likely he will need surgical intervention on his left leg should his symptoms become disabling. He however just returned to work after having TURP procedure in May. His symptoms are not disabling and he knows that likely surgery will be required to improve his symptoms. I also discussed that he has some inflow stenosis in the bypass that could jeopardize the bypass so this could be be simultaneously address at time of Angiogram but he would  like to hold off on follow up for several months. He says he plans to stop working next year and then he can plan to take more time to take care of his legs - he will continue his aspirin and statin and I have encouraged him to continue his exercise/ walking to promote development of collaterals - Patient will follow up sooner should he have new or worsening symptoms -He will follow up in 3 months with bypass duplex of RLE, arterial duplex of LLE and ABI's and I will have him see Dr. Jeri Lager, PA-C [facility] [phone]  Clinic MD: Dr. [name]

## 2020-06-04 ENCOUNTER — Ambulatory Visit: Payer: Medicare Other | Admitting: Internal Medicine

## 2020-06-07 ENCOUNTER — Other Ambulatory Visit: Payer: Self-pay

## 2020-06-07 ENCOUNTER — Encounter: Payer: Self-pay | Admitting: Internal Medicine

## 2020-06-07 ENCOUNTER — Ambulatory Visit: Payer: Medicare Other | Admitting: Internal Medicine

## 2020-06-07 VITALS — BP 108/52 | HR 68 | Temp 98.1°F | Resp 18 | Ht 65.0 in | Wt 122.5 lb

## 2020-06-07 DIAGNOSIS — E1159 Type 2 diabetes mellitus with other circulatory complications: Secondary | ICD-10-CM

## 2020-06-07 DIAGNOSIS — R634 Abnormal weight loss: Secondary | ICD-10-CM

## 2020-06-07 DIAGNOSIS — I10 Essential (primary) hypertension: Secondary | ICD-10-CM | POA: Diagnosis not present

## 2020-06-07 DIAGNOSIS — Z09 Encounter for follow-up examination after completed treatment for conditions other than malignant neoplasm: Secondary | ICD-10-CM

## 2020-06-07 DIAGNOSIS — Z794 Long term (current) use of insulin: Secondary | ICD-10-CM

## 2020-06-07 DIAGNOSIS — Z23 Encounter for immunization: Secondary | ICD-10-CM

## 2020-06-07 DIAGNOSIS — I739 Peripheral vascular disease, unspecified: Secondary | ICD-10-CM

## 2020-06-07 LAB — POCT GLUCOSE (DEVICE FOR HOME USE): POC Glucose: 339 mg/dl — AB (ref 70–99)

## 2020-06-07 NOTE — Progress Notes (Signed)
Pre visit review using our clinic review tool, if applicable. No additional management support is needed unless otherwise documented below in the visit note. 

## 2020-06-07 NOTE — Patient Instructions (Addendum)
Check the  blood pressure every other day  BP GOAL is between 110/65 and  135/85. If it is consistently higher or lower, let me know   Diabetes: Check your blood sugar  once a day      at different times of the day  GOALS: Fasting before a meal 70- 130 2 hours after a meal less than 180    GO TO THE FRONT DESK, [place] back for   a checkup in 3 months

## 2020-06-07 NOTE — Progress Notes (Addendum)
Subjective:    Patient ID: Steven [name], male    DOB: 01/29/1946, 74 y.o.   MRN: [id number]  DOS:  06/07/2020 Type of visit - description: Follow-up Since the last office visit he is doing well.  On looking back, his thinks  the teeth plate he has is making eating difficult and that was the reason why he was not eating well and losing weight. He is currently working 40 hours a week, denies any major problems with fatigue.  Wt Readings from Last 3 Encounters:  06/07/20 122 lb 8 oz (55.6 kg)  06/02/20 124 lb 14.4 oz (56.7 kg)  03/02/20 128 lb 9.6 oz (58.3 kg)   BP Readings from Last 3 Encounters:  06/07/20 (!) 108/52  06/02/20 129/60  03/02/20 124/69     Review of Systems Denies chest pain or difficulty breathing No nausea, vomiting, diarrhea.  No blood in the stool.  Past Medical History:  Diagnosis Date  . Allergy   . Arthritis    Right Hip  . Atrial fibrillation ([place])   . BPH (benign prostatic hyperplasia)   . COPD (chronic obstructive pulmonary disease) ([place])   . Coronary artery disease    Lexiscan Myoview 7/14:  Inf scar with minimal reversibility, inf HK, EF 45%;  Low risk.  Promus LAD stent placed in [place].  100% RCA  . Diabetes 1.5, managed as type 1 ([place])   . Diverticulosis    on CT  . Dyslipidemia   . GERD (gastroesophageal reflux disease)    has used Zantac in the past   . Glaucoma   . Hammer toes, bilateral   . Hernia   . Hyperlipidemia   . Hypertension   . Peripheral vascular disease ([place])    a. s/p R fem-pop BPG 01/2013 (Dr. [name])  . 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 ABDOMINAL AORTAGRAM N/A 12/10/2012   Procedure: ABDOMINAL Maxcine Ham;  Surgeon: Serafina Mitchell, MD;  Location: [facility] CATH LAB;  Service: Cardiovascular;  Laterality: N/A;  . CARDIAC CATHETERIZATION     with stent placement  . FEMORAL-POPLITEAL BYPASS GRAFT Right 02/07/2013    Procedure: BYPASS GRAFT FEMORAL-POPLITEAL ARTERY- RIGHT;  Surgeon: Mal Misty, MD;  Location[facility] OR;  Service: Vascular;  Laterality: Right;  Right femoral to below knee popliteal bypass using right non reversed great saphenous vein  . INGUINAL HERNIA REPAIR  2010   Right  . INGUINAL HERNIA REPAIR  09/11/2018  . INTRAOPERATIVE ARTERIOGRAM Right 02/07/2013   Procedure: Attempted INTRA OPERATIVE ARTERIOGRAM;  Surgeon: Mal Misty, MD;  Location: [place];  Service: Vascular;  Laterality: Right;  . IR RETROGRADE PYELOGRAM  12/10/2019  . TRANSURETHRAL RESECTION OF PROSTATE  12/10/2019    Allergies as of 06/07/2020   No Known Allergies     Medication List       Accurate as of June 07, 2020 11:59 PM. If you have any questions, ask your nurse or doctor.        amLODipine 10 MG tablet Commonly known as: NORVASC Take 1 tablet (10 mg total) by mouth daily.   aspirin EC 81 MG tablet Take 81 mg by mouth daily.   blood glucose meter kit and supplies Dispense based on patient and insurance preference. Check blood sugars twice daily. Dx: E11.9   cetirizine 10 MG tablet Commonly known as: ZYRTEC Take 10 mg by mouth daily.  empagliflozin 10 MG Tabs tablet Commonly known as: Jardiance Take 1 tablet (10 mg total) by mouth daily before breakfast.   ezetimibe 10 MG tablet Commonly known as: Zetia Take 1 tablet (10 mg total) by mouth daily.   fluticasone 50 MCG/ACT nasal spray Commonly known as: FLONASE Place 2 sprays into both nostrils daily.   glucose blood test strip Commonly known as: OneTouch Verio Use to check blood sugar 2 times per day   Insulin Pen Needle 32G X 4 MM Misc Commonly known as: BD Pen Needle Nano U/F use as directed with INSULIN   losartan 100 MG tablet Commonly known as: COZAAR Take 1 tablet (100 mg total) by mouth daily.   metFORMIN 1000 MG tablet Commonly known as: GLUCOPHAGE Take 1 tablet (1,000 mg total) by mouth 2 (two) times daily with a  meal.   metoprolol succinate 100 MG 24 hr tablet Commonly known as: TOPROL-XL Take 1.5 tablets (150 mg total) by mouth daily.   mometasone 50 MCG/ACT nasal spray Commonly known as: NASONEX PLACE 2 SPRAYS INTO THE NOSE DAILY   OneTouch Delica Lancets 01E Misc Use to check blood sugar 2 times per day.   OneTouch Verio IQ System w/Device Kit Use to check blood sugar 2 times per day.   PEG-3350/Electrolytes 236 g Solr   rosuvastatin 40 MG tablet Commonly known as: CRESTOR Take 1 tablet (40 mg total) by mouth daily.   tamsulosin 0.4 MG Caps capsule Commonly known as: FLOMAX TAKE 1 CAPSULE(0.4 MG) BY MOUTH DAILY   Toujeo SoloStar 300 UNIT/ML Solostar Pen Generic drug: insulin glargine (1 Unit Dial) Inject 15 Units into the skin daily.          Objective:   Physical Exam BP (!) 108/52 (BP Location: Left Arm)   Pulse 68   Temp 98.1 F (36.7 C) (Oral)   Resp 18   Ht _0  (1.651 m)   Wt 122 lb 8 oz (55.6 kg)   SpO2 97%   BMI 20.39 kg/m  General:   Well developed, NAD, BMI noted. HEENT:  Normocephalic . Face symmetric, atraumatic Lungs:  CTA B Normal respiratory effort, no intercostal retractions, no accessory muscle use. Heart: RRR,  no murmur.  Lower extremities: no pretibial edema bilaterally  Skin: Not pale. Not jaundice Neurologic:  alert & oriented X3.  Speech normal, gait appropriate for age and unassisted Psych--  Cognition and judgment appear intact.  Cooperative with normal attention span and concentration.  Behavior appropriate. No anxious or depressed appearing.      Assessment    Assessment (new pt to me 12/2019) DM HTN Hyperlipidemia CRI: Creatinine fluctuates 1.70- 1.40 CV: --CAD, Dr. Percival Spanish --PAD  Sickle cell anemia COPD BPH, TURP and bilateral retrograde pyelograms 12/10/2019   PLAN OF:HQRFXJOIT on Metformin, insulin 15 units, Jardiance (started 12/2019).  CBG this afternoon: 339. CBGs @ home in  the 130s except 1 time was 59.   Reports no symptoms with low sugars. Check A1c, consider adjust meds. HTN: Currently on losartan, metoprolol, amlodipine, BP today is low for the first time (100/61, recheck 108/52),  at home reportedly in the 130s. Check a BMP.  No change for now. Weight loss, change in bowel habits, decreased appetite: See last visit, hemoglobin was increasing, iron ferritin normal.  Looking back, patient thinks his appetite is lost because he has teeth plates that are uncomfortable, his thinking about implants.  He is a still losing a small amount of weight (122 pounds today). He does not  have postprandial abdominal pain thus GI claudication as the cause of weight loss is unlikely. Preventive care: Flu shot today, plans to have a booster soon. RTC 3 months   This visit occurred during the SARS-CoV-2 public health emergency.  Safety protocols were in place, including screening questions prior to the visit, additional usage of staff PPE, and extensive cleaning of exam room while observing appropriate contact time as indicated for disinfecting solutions.

## 2020-06-08 ENCOUNTER — Telehealth: Payer: Self-pay | Admitting: Internal Medicine

## 2020-06-08 LAB — BASIC METABOLIC PANEL
BUN: 37 mg/dL — ABNORMAL HIGH (ref 6–23)
CO2: 21 mEq/L (ref 19–32)
Calcium: 9.3 mg/dL (ref 8.4–10.5)
Chloride: 99 mEq/L (ref 96–112)
Creatinine, Ser: 2.69 mg/dL — ABNORMAL HIGH (ref 0.40–1.50)
GFR: 22.99 mL/min — ABNORMAL LOW (ref >60.00)
Glucose, Bld: 356 mg/dL — ABNORMAL HIGH (ref 70–99)
Potassium: 4.4 mEq/L (ref 3.5–5.1)
Sodium: 134 mEq/L — ABNORMAL LOW (ref 135–145)

## 2020-06-08 LAB — HEMOGLOBIN A1C: Hgb A1c MFr Bld: 7 % — ABNORMAL HIGH (ref 4.6–6.5)

## 2020-06-08 NOTE — Telephone Encounter (Signed)
Labs: Creatinine increased from 1.48 to 2.6 A1c slightly better at 7.0 He is started [place] a couple of months ago. Has been on losartan for a while. Plan: Advise patient, kidney function decreased. -Stop Metformin and Jardiance.  Increase insulin from 15 to 20 units daily.  Monitor CBGs -Stop losartan, drink plenty of fluids, monitor ambulatory BPs  - If he is taking any OTC NSAID need to stop -BMP in 1 week -Renal ultrasound DX renal failure Further advised with results

## 2020-06-08 NOTE — Telephone Encounter (Signed)
LMOM informing Pt to return call.  

## 2020-06-08 NOTE — Assessment & Plan Note (Addendum)
RK:SYSDBNRWK on Metformin, insulin 15 units, Jardiance (started 12/2019).  CBG this afternoon: 339. CBGs @ home in  the 130s except 1 time was 59.  Reports no symptoms with low sugars. Check A1c, consider adjust meds. HTN: Currently on losartan, metoprolol, amlodipine, BP today is low for the first time (100/61, recheck 108/52),  at home reportedly in the 130s. Check a BMP.  No change for now. Weight loss, change in bowel habits, decreased appetite: See last visit, hemoglobin was increasing, iron ferritin normal.  Looking back, patient thinks his appetite is lost because he has teeth plates that are uncomfortable, his thinking about implants.  He is a still losing a small amount of weight (122 pounds today). He does not have postprandial abdominal pain thus GI claudication as the cause of weight loss is unlikely. Preventive care: Flu shot today, plans to have a booster soon. RTC 3 months

## 2020-06-09 ENCOUNTER — Encounter (HOSPITAL_BASED_OUTPATIENT_CLINIC_OR_DEPARTMENT_OTHER): Payer: Self-pay

## 2020-06-09 ENCOUNTER — Telehealth: Payer: Self-pay

## 2020-06-09 ENCOUNTER — Inpatient Hospital Stay (HOSPITAL_BASED_OUTPATIENT_CLINIC_OR_DEPARTMENT_OTHER)
Admission: EM | Admit: 2020-06-09 | Discharge: 2020-06-12 | DRG: 682 | Disposition: A | Discharge status: Home / Self Care | Payer: Medicare Other | Attending: Internal Medicine | Admitting: Internal Medicine

## 2020-06-09 ENCOUNTER — Emergency Department (HOSPITAL_BASED_OUTPATIENT_CLINIC_OR_DEPARTMENT_OTHER): Payer: Medicare Other

## 2020-06-09 ENCOUNTER — Other Ambulatory Visit: Payer: Self-pay

## 2020-06-09 DIAGNOSIS — J449 Chronic obstructive pulmonary disease, unspecified: Secondary | ICD-10-CM | POA: Diagnosis present

## 2020-06-09 DIAGNOSIS — E1165 Type 2 diabetes mellitus with hyperglycemia: Secondary | ICD-10-CM | POA: Diagnosis present

## 2020-06-09 DIAGNOSIS — Z79899 Other long term (current) drug therapy: Secondary | ICD-10-CM

## 2020-06-09 DIAGNOSIS — I10 Essential (primary) hypertension: Secondary | ICD-10-CM | POA: Diagnosis not present

## 2020-06-09 DIAGNOSIS — K859 Acute pancreatitis without necrosis or infection, unspecified: Secondary | ICD-10-CM | POA: Diagnosis not present

## 2020-06-09 DIAGNOSIS — Z833 Family history of diabetes mellitus: Secondary | ICD-10-CM

## 2020-06-09 DIAGNOSIS — Z20822 Contact with and (suspected) exposure to covid-19: Secondary | ICD-10-CM | POA: Diagnosis present

## 2020-06-09 DIAGNOSIS — K8500 Idiopathic acute pancreatitis without necrosis or infection: Secondary | ICD-10-CM

## 2020-06-09 DIAGNOSIS — E1122 Type 2 diabetes mellitus with diabetic chronic kidney disease: Secondary | ICD-10-CM | POA: Diagnosis present

## 2020-06-09 DIAGNOSIS — N400 Enlarged prostate without lower urinary tract symptoms: Secondary | ICD-10-CM | POA: Diagnosis present

## 2020-06-09 DIAGNOSIS — Z8249 Family history of ischemic heart disease and other diseases of the circulatory system: Secondary | ICD-10-CM

## 2020-06-09 DIAGNOSIS — R109 Unspecified abdominal pain: Secondary | ICD-10-CM | POA: Diagnosis present

## 2020-06-09 DIAGNOSIS — R748 Abnormal levels of other serum enzymes: Secondary | ICD-10-CM | POA: Diagnosis not present

## 2020-06-09 DIAGNOSIS — Z955 Presence of coronary angioplasty implant and graft: Secondary | ICD-10-CM | POA: Diagnosis not present

## 2020-06-09 DIAGNOSIS — N179 Acute kidney failure, unspecified: Secondary | ICD-10-CM | POA: Diagnosis present

## 2020-06-09 DIAGNOSIS — K852 Alcohol induced acute pancreatitis without necrosis or infection: Secondary | ICD-10-CM | POA: Diagnosis present

## 2020-06-09 DIAGNOSIS — E0800 Diabetes mellitus due to underlying condition with hyperosmolarity without nonketotic hyperglycemic-hyperosmolar coma (NKHHC): Secondary | ICD-10-CM

## 2020-06-09 DIAGNOSIS — H409 Unspecified glaucoma: Secondary | ICD-10-CM | POA: Diagnosis present

## 2020-06-09 DIAGNOSIS — N189 Chronic kidney disease, unspecified: Secondary | ICD-10-CM | POA: Diagnosis present

## 2020-06-09 DIAGNOSIS — K529 Noninfective gastroenteritis and colitis, unspecified: Secondary | ICD-10-CM | POA: Diagnosis present

## 2020-06-09 DIAGNOSIS — N1831 Chronic kidney disease, stage 3a: Secondary | ICD-10-CM | POA: Diagnosis present

## 2020-06-09 DIAGNOSIS — I2510 Atherosclerotic heart disease of native coronary artery without angina pectoris: Secondary | ICD-10-CM | POA: Diagnosis present

## 2020-06-09 DIAGNOSIS — E785 Hyperlipidemia, unspecified: Secondary | ICD-10-CM | POA: Diagnosis present

## 2020-06-09 DIAGNOSIS — I129 Hypertensive chronic kidney disease with stage 1 through stage 4 chronic kidney disease, or unspecified chronic kidney disease: Secondary | ICD-10-CM | POA: Diagnosis present

## 2020-06-09 DIAGNOSIS — E860 Dehydration: Secondary | ICD-10-CM | POA: Diagnosis present

## 2020-06-09 DIAGNOSIS — D571 Sickle-cell disease without crisis: Secondary | ICD-10-CM | POA: Diagnosis present

## 2020-06-09 DIAGNOSIS — Z83438 Family history of other disorder of lipoprotein metabolism and other lipidemia: Secondary | ICD-10-CM | POA: Diagnosis not present

## 2020-06-09 DIAGNOSIS — K219 Gastro-esophageal reflux disease without esophagitis: Secondary | ICD-10-CM | POA: Diagnosis present

## 2020-06-09 DIAGNOSIS — Z794 Long term (current) use of insulin: Secondary | ICD-10-CM

## 2020-06-09 DIAGNOSIS — Z87891 Personal history of nicotine dependence: Secondary | ICD-10-CM

## 2020-06-09 DIAGNOSIS — A084 Viral intestinal infection, unspecified: Secondary | ICD-10-CM | POA: Diagnosis not present

## 2020-06-09 DIAGNOSIS — I4891 Unspecified atrial fibrillation: Secondary | ICD-10-CM | POA: Diagnosis present

## 2020-06-09 DIAGNOSIS — E1151 Type 2 diabetes mellitus with diabetic peripheral angiopathy without gangrene: Secondary | ICD-10-CM | POA: Diagnosis present

## 2020-06-09 DIAGNOSIS — Z7982 Long term (current) use of aspirin: Secondary | ICD-10-CM

## 2020-06-09 LAB — COMPREHENSIVE METABOLIC PANEL
ALT: 481 U/L — ABNORMAL HIGH (ref 0–44)
AST: 246 U/L — ABNORMAL HIGH (ref 15–41)
Albumin: 3.5 g/dL (ref 3.5–5.0)
Alkaline Phosphatase: 131 U/L — ABNORMAL HIGH (ref 38–126)
Anion gap: 11 (ref 5–15)
BUN: 30 mg/dL — ABNORMAL HIGH (ref 8–23)
CO2: 25 mmol/L (ref 22–32)
Calcium: 9.8 mg/dL (ref 8.9–10.3)
Chloride: 101 mmol/L (ref 98–111)
Creatinine, Ser: 2.6 mg/dL — ABNORMAL HIGH (ref 0.61–1.24)
GFR, Estimated: 25 mL/min — ABNORMAL LOW (ref >60)
Glucose, Bld: 241 mg/dL — ABNORMAL HIGH (ref 70–99)
Potassium: 4.5 mmol/L (ref 3.5–5.1)
Sodium: 137 mmol/L (ref 135–145)
Total Bilirubin: 0.4 mg/dL (ref 0.3–1.2)
Total Protein: 6.8 g/dL (ref 6.5–8.1)

## 2020-06-09 LAB — CBC
HCT: 39.1 % (ref 39.0–52.0)
Hemoglobin: 13.1 g/dL (ref 13.0–17.0)
MCH: 30.3 pg (ref 26.0–34.0)
MCHC: 33.5 g/dL (ref 30.0–36.0)
MCV: 90.3 fL (ref 80.0–100.0)
Platelets: 148 10*3/uL — ABNORMAL LOW (ref 150–400)
RBC: 4.33 MIL/uL (ref 4.22–5.81)
RDW: 13.6 % (ref 11.5–15.5)
WBC: 5.8 10*3/uL (ref 4.0–10.5)
nRBC: 0 % (ref 0.0–0.2)

## 2020-06-09 LAB — URINALYSIS, MICROSCOPIC (REFLEX)

## 2020-06-09 LAB — GLUCOSE, CAPILLARY: Glucose-Capillary: 75 mg/dL (ref 70–99)

## 2020-06-09 LAB — RESPIRATORY PANEL BY RT PCR (FLU A&B, COVID)
Influenza A by PCR: NEGATIVE
Influenza B by PCR: NEGATIVE
SARS Coronavirus 2 by RT PCR: NEGATIVE

## 2020-06-09 LAB — URINALYSIS, ROUTINE W REFLEX MICROSCOPIC
Bilirubin Urine: NEGATIVE
Glucose, UA: >=500 mg/dL — AB
Ketones, ur: NEGATIVE mg/dL
Leukocytes,Ua: NEGATIVE
Nitrite: NEGATIVE
Protein, ur: NEGATIVE mg/dL
Specific Gravity, Urine: 1.015 (ref 1.005–1.030)
pH: 7 (ref 5.0–8.0)

## 2020-06-09 LAB — LIPASE, BLOOD: Lipase: 175 U/L — ABNORMAL HIGH (ref 11–51)

## 2020-06-09 LAB — LACTIC ACID, PLASMA: Lactic Acid, Venous: 2.8 mmol/L (ref 0.5–1.9)

## 2020-06-09 MED ORDER — HEPARIN SODIUM (PORCINE) 5000 UNIT/ML IJ SOLN
5000.0000 [IU] | Freq: Three times a day (TID) | INTRAMUSCULAR | Status: DC
  Administered 2020-06-10 – 2020-06-11 (×5): 5000 [IU] via SUBCUTANEOUS
  Filled 2020-06-09 (×5): qty 1

## 2020-06-09 MED ORDER — METOPROLOL SUCCINATE ER 50 MG PO TB24
150.0000 mg | ORAL_TABLET | Freq: Every day | ORAL | Status: DC
  Administered 2020-06-10 – 2020-06-12 (×3): 150 mg via ORAL
  Filled 2020-06-09 (×3): qty 3

## 2020-06-09 MED ORDER — LORATADINE 10 MG PO TABS
10.0000 mg | ORAL_TABLET | Freq: Every day | ORAL | Status: DC
  Administered 2020-06-10 – 2020-06-12 (×3): 10 mg via ORAL
  Filled 2020-06-09 (×3): qty 1

## 2020-06-09 MED ORDER — ROSUVASTATIN CALCIUM 20 MG PO TABS
40.0000 mg | ORAL_TABLET | Freq: Every day | ORAL | Status: DC
  Administered 2020-06-10: 40 mg via ORAL
  Filled 2020-06-09: qty 2

## 2020-06-09 MED ORDER — SODIUM CHLORIDE 0.9 % IV BOLUS
1000.0000 mL | Freq: Once | INTRAVENOUS | Status: AC
  Administered 2020-06-09: 1000 mL via INTRAVENOUS

## 2020-06-09 MED ORDER — TAMSULOSIN HCL 0.4 MG PO CAPS
0.4000 mg | ORAL_CAPSULE | Freq: Every day | ORAL | Status: DC
  Administered 2020-06-10 – 2020-06-12 (×3): 0.4 mg via ORAL
  Filled 2020-06-09 (×4): qty 1

## 2020-06-09 MED ORDER — EMPAGLIFLOZIN 10 MG PO TABS: 10.0000 mg | ORAL_TABLET | Freq: Every evening | ORAL | Status: DC

## 2020-06-09 MED ORDER — INSULIN GLARGINE 100 UNIT/ML ~~LOC~~ SOLN
15.0000 [IU] | Freq: Every day | SUBCUTANEOUS | Status: DC
  Administered 2020-06-10 – 2020-06-11 (×2): 15 [IU] via SUBCUTANEOUS
  Filled 2020-06-09 (×3): qty 0.15

## 2020-06-09 MED ORDER — FLUTICASONE PROPIONATE 50 MCG/ACT NA SUSP
1.0000 | Freq: Every day | NASAL | Status: DC
  Administered 2020-06-11 – 2020-06-12 (×2): 1 via NASAL
  Filled 2020-06-09: qty 16

## 2020-06-09 MED ORDER — ACETAMINOPHEN 325 MG PO TABS
650.0000 mg | ORAL_TABLET | Freq: Four times a day (QID) | ORAL | Status: DC | PRN
  Administered 2020-06-10 – 2020-06-11 (×2): 650 mg via ORAL
  Filled 2020-06-09 (×2): qty 2

## 2020-06-09 MED ORDER — INSULIN ASPART 100 UNIT/ML ~~LOC~~ SOLN
0.0000 [IU] | Freq: Three times a day (TID) | SUBCUTANEOUS | Status: DC
  Administered 2020-06-10 (×2): 3 [IU] via SUBCUTANEOUS
  Administered 2020-06-10: 08:00:00 2 [IU] via SUBCUTANEOUS
  Administered 2020-06-11: 17:00:00 3 [IU] via SUBCUTANEOUS
  Administered 2020-06-11 – 2020-06-12 (×2): 5 [IU] via SUBCUTANEOUS

## 2020-06-09 MED ORDER — SODIUM CHLORIDE 0.9 % IV SOLN: Freq: Once | INTRAVENOUS | Status: AC

## 2020-06-09 MED ORDER — SODIUM CHLORIDE 0.9 % IV SOLN: INTRAVENOUS | Status: DC

## 2020-06-09 MED ORDER — ONDANSETRON HCL 4 MG/2ML IJ SOLN: 4.0000 mg | Freq: Four times a day (QID) | INTRAMUSCULAR | Status: DC | PRN

## 2020-06-09 MED ORDER — INSULIN ASPART 100 UNIT/ML ~~LOC~~ SOLN
0.0000 [IU] | Freq: Every day | SUBCUTANEOUS | Status: DC
  Administered 2020-06-11: 22:00:00 2 [IU] via SUBCUTANEOUS

## 2020-06-09 MED ORDER — ACETAMINOPHEN 650 MG RE SUPP: 650.0000 mg | Freq: Four times a day (QID) | RECTAL | Status: DC | PRN

## 2020-06-09 MED ORDER — ASPIRIN EC 81 MG PO TBEC
81.0000 mg | DELAYED_RELEASE_TABLET | Freq: Every day | ORAL | Status: DC
  Administered 2020-06-10 – 2020-06-12 (×3): 81 mg via ORAL
  Filled 2020-06-09 (×3): qty 1

## 2020-06-09 MED ORDER — AMLODIPINE BESYLATE 10 MG PO TABS
10.0000 mg | ORAL_TABLET | Freq: Every day | ORAL | Status: DC
  Administered 2020-06-10 – 2020-06-12 (×3): 10 mg via ORAL
  Filled 2020-06-09 (×3): qty 1

## 2020-06-09 MED ORDER — ONDANSETRON HCL 4 MG PO TABS: 4.0000 mg | ORAL_TABLET | Freq: Four times a day (QID) | ORAL | Status: DC | PRN

## 2020-06-09 NOTE — Telephone Encounter (Signed)
Nurse Assessment Nurse: Clovis Riley RN, Georgina Peer Date/Time (Eastern Time): 06/09/2020 11:56:43 AM Confirm and document reason for call. If symptomatic, describe symptoms. ---Caller states he has pain in chest when eating and drinking. Vomitted on saturday and the pain has been there since. Does the patient have any new or worsening symptoms? ---Yes Will a triage be completed? ---Yes Related visit to physician within the last 2 weeks? ---No Does the PT have any chronic conditions? (i.e. diabetes, asthma, this includes High risk factors for pregnancy, etc.) ---Yes List chronic conditions. ---diabetes, HTN, cholesterol, stents in heart, Is this a behavioral health or substance abuse call? ---No Guidelines Guideline Title Affirmed Question Affirmed Notes Nurse Date/Time (Eastern Time) Swallowing Difficulty Symptoms of food or bone stuck in throat or esophagus (e.g., pain in throat or chest, FB sensation, blood-tinged saliva) [place], RN, Georgina Peer 06/09/2020 12:00:47 PM Disp. Time Eilene Ghazi Time) Disposition Final User 06/09/2020 11:54:24 AM Send to Urgent Queue LEVESQUE, JENNIFER PLEASE NOTE: All timestamps contained within this report are represented as [place] Standard Time. CONFIDENTIALTY NOTICE: This fax transmission is intended only for the addressee. It contains information that is legally privileged, confidential or otherwise protected from use or disclosure. If you are not the intended recipient, you are strictly prohibited from reviewing, disclosing, copying using or disseminating any of this information or taking any action in reliance on or regarding this information. If you have received this fax in error, please notify us immediately by telephone so that we can arrange for its return to [place]. Phone: [phone], Toll-Free: [phone], Fax: [phone] Page: 2 of 2 Call Id: [id number] 06/09/2020 12:02:55 PM Go to ED Now Yes Clovis Riley, RN, Leilani Merl Disagree/Comply Comply Caller  Understands Yes PreDisposition Search internet for information Care Advice Given Per Guideline GO TO ED NOW: * You need to be seen in the Emergency Department. * Go to the ED at ___________ [place] now. Drive carefully. NOTHING BY MOUTH: * Do not eat or drink anything for now. * (Reason: condition may require sedation, anesthesia, and endoscopy) CARE ADVICE given per Swallowed Difficulty (Adult) guideline. Referrals GO TO FACILITY OTHER - SPECIFY   Pt in ED now.

## 2020-06-09 NOTE — ED Provider Notes (Signed)
[facility] EMERGENCY DEPARTMENT Provider Note   CSN: [id number] Arrival date & time: 06/09/20  1254     History Chief Complaint  Patient presents with  . Abdominal Pain    Steven [name] is a 74 y.o. male.  HPI 74 year old male presents with vomiting and abdominal pain.  Abdominal pain started on 11/13.  The patient states that he has had 2 episodes of vomiting on that same day but ever since he just has the abdominal pain.  Had some looser stools this morning but has not been eating very well.  The pain is upper abdominal that then radiates down to his lower abdomen.  Occurs whenever he eats immediately.  No chest pain.  No back pain or fever. Has never had this before.    Past Medical History:  Diagnosis Date  . Allergy   . Arthritis    Right Hip  . Atrial fibrillation ([place])   . BPH (benign prostatic hyperplasia)   . COPD (chronic obstructive pulmonary disease) ([place])   . Coronary artery disease    Lexiscan Myoview 7/14:  Inf scar with minimal reversibility, inf HK, EF 45%;  Low risk.  Promus LAD stent placed in [place].  100% RCA  . Diabetes 1.5, managed as type 1 ([place])   . Diverticulosis    on CT  . Dyslipidemia   . GERD (gastroesophageal reflux disease)    has used Zantac in the past   . Glaucoma   . Hammer toes, bilateral   . Hernia   . Hyperlipidemia   . Hypertension   . Peripheral vascular disease ([place])    a. s/p R fem-pop BPG 01/2013 (Dr. [name])  . 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    Patient Active Problem List   Diagnosis Date Noted  . PCP NOTES >>>>>>>>>>>>>> 02/11/2020  . Inguinal hernia of left side without obstruction or gangrene 09/05/2018  . Choroidal nevus, left 04/17/2018  . Nuclear sclerotic cataract of both eyes 04/17/2018  . OAG (open angle glaucoma) suspect, high risk, bilateral 04/17/2018  . Diabetes ([place]) 05/28/2017  . Dyslipidemia 05/28/2017  . Pterygium eye, bilateral 11/09/2016    . PSA elevation 08/22/2016  . BPH (benign prostatic hyperplasia) 12/29/2014  . Bronchiectasis ([place]) 07/30/2014  . Hyperlipidemia with target LDL less than 70 08/25/2013  . Atherosclerotic PVD with intermittent claudication ([place]) 06/24/2013  . Chronic renal insufficiency 12/24/2012  . Seborrheic dermatitis 12/24/2012  . Chronic obstructive pulmonary disease ([place]) 12/01/2012  . Allergic rhinitis 11/29/2012  . GERD (gastroesophageal reflux disease) 09/10/2012  . CAD (coronary artery disease) 08/08/2012  . Benign essential hypertension 08/08/2012    Past Surgical History:  Procedure Laterality Date  . ABDOMINAL AORTAGRAM N/A 12/10/2012   Procedure: ABDOMINAL Maxcine Ham;  Surgeon: Serafina Mitchell, MD;  Location: [facility] CATH LAB;  Service: Cardiovascular;  Laterality: N/A;  . CARDIAC CATHETERIZATION     with stent placement  . FEMORAL-POPLITEAL BYPASS GRAFT Right 02/07/2013   Procedure: BYPASS GRAFT FEMORAL-POPLITEAL ARTERY- RIGHT;  Surgeon: Mal Misty, MD;  Location: [facility] OR;  Service: Vascular;  Laterality: Right;  Right femoral to below knee popliteal bypass using right non reversed great saphenous vein  . INGUINAL HERNIA REPAIR  2010   Right  . INGUINAL HERNIA REPAIR  09/11/2018  . INTRAOPERATIVE ARTERIOGRAM Right 02/07/2013   Procedure: Attempted INTRA OPERATIVE ARTERIOGRAM;  Surgeon: Mal Misty, MD;  Location: [facility];  Service: Vascular;  Laterality: Right;  .  IR RETROGRADE PYELOGRAM  12/10/2019  . TRANSURETHRAL RESECTION OF PROSTATE  12/10/2019       Family History  Problem Relation Age of Onset  . Diabetes Mother   . Hypertension Mother   . Heart disease Mother   . Hyperlipidemia Mother   . Diabetes Father   . Heart disease Father   . Hyperlipidemia Father   . Hypertension Father   . Diabetes Sister   . Heart disease Sister   . Hyperlipidemia Sister   . Hypertension Sister   . Diabetes Brother   . Heart disease Brother   . Hyperlipidemia Brother   . Hypertension  Brother   . Diabetes Son   . Heart disease Son   . Hyperlipidemia Son   . Hypertension Son   . Pancreatic cancer Son   . Alcohol abuse Neg Hx   . Cancer Neg Hx   . Depression Neg Hx   . Early death Neg Hx   . Kidney disease Neg Hx   . Stroke Neg Hx   . Colon cancer Neg Hx   . Esophageal cancer Neg Hx   . Rectal cancer Neg Hx   . Stomach cancer Neg Hx   . Liver cancer Neg Hx     Social History   Tobacco Use  . Smoking status: Former Smoker    Packs/day: 0.30    Years: 50.00    Pack years: 15.00    Types: Cigarettes    Quit date: 02/04/2011    Years since quitting: 9.3  . Smokeless tobacco: Never Used  Vaping Use  . Vaping Use: Never used  Substance Use Topics  . Alcohol use: Yes    Comment: wine daily  . Drug use: No    Home Medications Prior to Admission medications   Medication Sig Start Date End Date Taking? Authorizing Provider  amLODipine (NORVASC) 10 MG tablet Take 1 tablet (10 mg total) by mouth daily. 04/26/20   Colon Branch, MD  aspirin EC 81 MG tablet Take 81 mg by mouth daily.    [provider]  blood glucose meter kit and supplies Dispense based on patient and insurance preference. Check blood sugars twice daily. Dx: E11.9 02/10/20   Colon Branch, MD  Blood Glucose Monitoring Suppl (ONETOUCH VERIO IQ SYSTEM) w/Device KIT Use to check blood sugar 2 times per day. 10/09/16   Renato Shin, MD  cetirizine (ZYRTEC) 10 MG tablet Take 10 mg by mouth daily. 03/22/20   [provider]  empagliflozin (JARDIANCE) 10 MG TABS tablet Take 1 tablet (10 mg total) by mouth daily before breakfast. 03/15/20   Colon Branch, MD  ezetimibe (ZETIA) 10 MG tablet Take 1 tablet (10 mg total) by mouth daily. 07/15/15   Janith Lima, MD  fluticasone (FLONASE) 50 MCG/ACT nasal spray Place 2 sprays into both nostrils daily. 05/16/18   Shelda Pal, DO  glucose blood [facility] VERIO) test strip Use to check blood sugar 2 times per day 10/09/16   Renato Shin, MD    insulin glargine, 1 Unit Dial, (TOUJEO SOLOSTAR) 300 UNIT/ML Solostar Pen Inject 15 Units into the skin daily. 03/02/20   Colon Branch, MD  Insulin Pen Needle (BD PEN NEEDLE NANO U/F) 32G X 4 MM MISC use as directed with INSULIN 11/10/13   Janith Lima, MD  losartan (COZAAR) 100 MG tablet Take 1 tablet (100 mg total) by mouth daily. 10/01/18   Minus Breeding, MD  metFORMIN (GLUCOPHAGE) 1000 MG tablet  Take 1 tablet (1,000 mg total) by mouth 2 (two) times daily with a meal. 10/22/18   Wendling, Crosby Oyster, DO  metoprolol succinate (TOPROL-XL) 100 MG 24 hr tablet Take 1.5 tablets (150 mg total) by mouth daily. 02/10/20   Colon Branch, MD  mometasone (NASONEX) 50 MCG/ACT nasal spray PLACE 2 SPRAYS INTO THE NOSE DAILY 10/10/18   Wendling, Crosby Oyster, DO  [facility] DELICA LANCETS 76K MISC Use to check blood sugar 2 times per day. 10/09/16   Renato Shin, MD  PEG 3350-KCl-NaBcb-NaCl-NaSulf (PEG-3350/ELECTROLYTES) 236 g SOLR  12/04/17   [provider]  rosuvastatin (CRESTOR) 40 MG tablet Take 1 tablet (40 mg total) by mouth daily. 05/19/20   Colon Branch, MD  tamsulosin (FLOMAX) 0.4 MG CAPS capsule TAKE 1 CAPSULE(0.4 MG) BY MOUTH DAILY 10/10/18   Shelda Pal, DO    Allergies    Patient has no known allergies.  Review of Systems   Review of Systems  Constitutional: Negative for fever.  Cardiovascular: Negative for chest pain.  Gastrointestinal: Positive for abdominal pain, diarrhea and vomiting. Negative for constipation.  Genitourinary: Negative for dysuria.  All other systems reviewed and are negative.   Physical Exam Updated Vital Signs BP (!) 124/56 (BP Location: Right Arm)   Pulse 60   Temp 98.3 F (36.8 C)   Resp 15   SpO2 96%   Physical Exam Vitals and nursing note reviewed.  Constitutional:      General: He is not in acute distress.    Appearance: He is well-developed. He is not ill-appearing or diaphoretic.  HENT:     Head: Normocephalic and atraumatic.      Right Ear: External ear normal.     Left Ear: External ear normal.     Nose: Nose normal.  Eyes:     General:        Right eye: No discharge.        Left eye: No discharge.  Cardiovascular:     Rate and Rhythm: Normal rate and regular rhythm.     Heart sounds: Normal heart sounds.  Pulmonary:     Effort: Pulmonary effort is normal.     Breath sounds: Normal breath sounds.  Abdominal:     General: There is no distension.     Palpations: Abdomen is soft.     Tenderness: There is no abdominal tenderness.  Musculoskeletal:     Cervical back: Neck supple.  Skin:    General: Skin is warm and dry.  Neurological:     Mental Status: He is alert.  Psychiatric:        Mood and Affect: Mood is not anxious.     ED Results / Procedures / Treatments   Labs (all labs ordered are listed, but only abnormal results are displayed) Labs Reviewed  LIPASE, BLOOD - Abnormal; Notable for the following components:      Result Value   Lipase 175 (*)    All other components within normal limits  COMPREHENSIVE METABOLIC PANEL - Abnormal; Notable for the following components:   Glucose, Bld 241 (*)    BUN 30 (*)    Creatinine, Ser 2.60 (*)    AST 246 (*)    ALT 481 (*)    Alkaline Phosphatase 131 (*)    GFR, Estimated 25 (*)    All other components within normal limits  CBC - Abnormal; Notable for the following components:   Platelets 148 (*)    All other components within  normal limits  URINALYSIS, ROUTINE W REFLEX MICROSCOPIC  LACTIC ACID, PLASMA    EKG EKG Interpretation  Date/Time:  Wednesday June 09 2020 12:59:49 EST Ventricular Rate:  69 PR Interval:  158 QRS Duration: 98 QT Interval:  386 QTC Calculation: 413 R Axis:   40 Text Interpretation: Normal sinus rhythm Inferior infarct , age undetermined ST & T wave abnormality, consider lateral ischemia Abnormal ECG Interpretation limited secondary to artifact otherwise similar to 2014 Confirmed by Sherwood Gambler [phone]) on  06/09/2020 1:58:02 PM   Radiology CT ABDOMEN PELVIS WO CONTRAST  Result Date: 06/09/2020 CLINICAL DATA:  Nausea and vomiting beginning 5 days ago. Abdominal pain. EXAM: CT ABDOMEN AND PELVIS WITHOUT CONTRAST TECHNIQUE: Multidetector CT imaging of the abdomen and pelvis was performed following the standard protocol without IV contrast. COMPARISON:  06/14/2019 FINDINGS: Lower chest: Chronic scarring at the lung bases, right more than left. No active process identified. Hepatobiliary: Normal appearance of the liver without contrast. No calcified gallstones. Few scattered punctate arterial calcifications. Pancreas: Normal Spleen: Normal Adrenals/Urinary Tract: Adrenal glands are normal. Kidneys are normal. Extensive arterial calcification. Bladder is normal. Stomach/Bowel: Stomach and small intestine appear normal. Normal appendix. Moderate amount of fecal matter in the colon, within normal limits. Evidence of diverticulitis or obstruction Vascular/Lymphatic: Extensive aortic atherosclerosis and branch vessel atherosclerosis. IVC is normal. No adenopathy. Reproductive: Normal Other: No free fluid or air. Musculoskeletal: Negative IMPRESSION: 1. No acute finding to explain the clinical presentation. No evidence of bowel obstruction or other acute bowel pathology. 2. Extensive aortic atherosclerosis and branch vessel atherosclerosis. 3. Chronic scarring at the lung bases, right more than left. Aortic Atherosclerosis (ICD10-I70.0). Electronically Signed   By: Nelson Chimes M.D.   On: 06/09/2020 15:12    Procedures Procedures (including critical care time)  Medications Ordered in ED Medications  0.9 %  sodium chloride infusion (has no administration in time range)  sodium chloride 0.9 % bolus 1,000 mL (1,000 mLs Intravenous New Bag/Given 06/09/20 1449)    ED Course  I have reviewed the triage vital signs and the nursing notes.  Pertinent labs & imaging results that were available during my care of the  patient were reviewed by me and considered in my medical decision making (see chart for details).    MDM Rules/Calculators/A&P                          Patient's labs show AKI. Cr same as a couple days ago but new for him otherwise. Given his symptoms started before that, this could all be acute. Given IV fluids. Consider mesenteric ischemia, but no pain right now. Will add on lactate. Get RUQ ultrasound given LFT abnormalities. Unable to get CTA because of renal dysfunction. Care to Dr. Roslynn Amble.  Final Clinical Impression(s) / ED Diagnoses Final diagnoses:  None    Rx / DC Orders ED Discharge Orders    None       Sherwood Gambler, MD 06/09/20 1539

## 2020-06-09 NOTE — H&P (Signed)
History and Physical   ESA RADEN ESP:[id number] DOB: 10-06-1945 DOA: 06/09/2020  Referring MD/NP/PA: Dr. Roslynn Amble  PCP: Colon Branch, MD   Outpatient Specialists: None  Patient coming from: Home  Chief Complaint: Abdominal pain nausea with vomiting  HPI: Steven [name] is a 74 y.o. male with medical history significant of diabetes, hypertension, coronary artery disease, hypertension, peripheral vascular disease, COPD, atrial fibrillation, GERD who came in to the ER was sudden onset of abdominal pain nausea vomiting. He is having significant pain mainly in the right lower quadrant and upper quadrant. Associated with the vomiting which has stopped in the ER. He was having significant colicky pain at 6 out of 10. Denied any hematemesis no melena no bright red blood per rectum. Patient was seen to have elevated liver enzymes lipase. He has had CT abdomen and pelvis showed no acute findings. Right upper quadrant abdominal ultrasound was also negative. He continues to have ongoing pain. Patient noted to have acute kidney injury however with doubling of his baseline creatinine. He is being admitted with AKI most likely due to dehydration and nonspecific abdominal pain..  ED Course: Temperature 98.7, blood pressure 107/54, pulse 85 respirate 20 oxygen sat 96% on room air. Chemistry showed glucose 241 BUN 30 creatinine 2.60 calcium 9.8. Lipase is 175 AST was 75 ALT 246 ALT 481 and total bilirubin of 0.4. His CBC appeared to be within normal. CT abdomen pelvis shows no acute findings no evidence of bowel obstruction does extensive atherosclerosis. Right upper quadrant abdominal ultrasound is normal. Patient being admitted with a AKI.  Review of Systems: As per HPI otherwise 10 point review of systems negative.    Past Medical History:  Diagnosis Date  . Allergy   . Arthritis    Right Hip  . Atrial fibrillation ([place])   . BPH (benign prostatic hyperplasia)   . COPD (chronic obstructive  pulmonary disease) ([place])   . Coronary artery disease    Lexiscan Myoview 7/14:  Inf scar with minimal reversibility, inf HK, EF 45%;  Low risk.  Promus LAD stent placed in [place].  100% RCA  . Diabetes 1.5, managed as type 1 ([place])   . Diverticulosis    on CT  . Dyslipidemia   . GERD (gastroesophageal reflux disease)    has used Zantac in the past   . Glaucoma   . Hammer toes, bilateral   . Hernia   . Hyperlipidemia   . Hypertension   . Peripheral vascular disease ([place])    a. s/p R fem-pop BPG 01/2013 (Dr. [name])  . 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 ABDOMINAL AORTAGRAM N/A 12/10/2012   Procedure: ABDOMINAL Maxcine Ham;  Surgeon: Serafina Mitchell, MD;  Location: [facility] C[facility] LAB;  Service: Cardiovascular;  Laterality: N/A;  . CARDIAC CATHETERIZATION     with stent placement  . FEMORAL-POPLITEAL BYPASS GRAFT Right 02/07/2013   Procedure: BYPASS GRAFT FEMORAL-POPLITEAL ARTERY- RIGHT;  Surgeon: Mal Misty, MD;  Location: [facility] OR;  Service: Vascular;  Laterality: Right;  Right femoral to below knee popliteal bypass using right non reversed great saphenous vein  . INGUINAL HERNIA REPAIR  2010   Right  . INGUINAL HERNIA REPAIR  09/11/2018  . INTRAOPERATIVE ARTERIOGRAM Right 02/07/2013   Procedure: Attempted INTRA OPERATIVE ARTERIOGRAM;  Surgeon: Mal Misty, MD;  Location: [place];  Service: Vascular;  Laterality: Right;  .  IR RETROGRADE PYELOGRAM  12/10/2019  . TRANSURETHRAL RESECTION OF PROSTATE  12/10/2019     reports that he quit smoking about 9 years ago. His smoking use included cigarettes. He has a 15.00 pack-year smoking history. He has never used smokeless tobacco. He reports current alcohol use. He reports that he does not use drugs.  No Known Allergies  Family History  Problem Relation Age of Onset  . Diabetes Mother   . Hypertension Mother   . Heart disease Mother   .  Hyperlipidemia Mother   . Diabetes Father   . Heart disease Father   . Hyperlipidemia Father   . Hypertension Father   . Diabetes Sister   . Heart disease Sister   . Hyperlipidemia Sister   . Hypertension Sister   . Diabetes Brother   . Heart disease Brother   . Hyperlipidemia Brother   . Hypertension Brother   . Diabetes Son   . Heart disease Son   . Hyperlipidemia Son   . Hypertension Son   . Pancreatic cancer Son   . Alcohol abuse Neg Hx   . Cancer Neg Hx   . Depression Neg Hx   . Early death Neg Hx   . Kidney disease Neg Hx   . Stroke Neg Hx   . Colon cancer Neg Hx   . Esophageal cancer Neg Hx   . Rectal cancer Neg Hx   . Stomach cancer Neg Hx   . Liver cancer Neg Hx      Prior to Admission medications   Medication Sig Start Date End Date Taking? Authorizing Provider  amLODipine (NORVASC) 10 MG tablet Take 1 tablet (10 mg total) by mouth daily. 04/26/20  Yes Colon Branch, MD  aspirin EC 81 MG tablet Take 81 mg by mouth daily.   Yes [provider]  cetirizine (ZYRTEC) 10 MG tablet Take 10 mg by mouth daily. 03/22/20  Yes [provider]  empagliflozin (JARDIANCE) 10 MG TABS tablet Take 1 tablet (10 mg total) by mouth daily before breakfast. Patient taking differently: Take 10 mg by mouth every evening.  03/15/20  Yes Paz, Jacqulyn Bath E, MD  insulin glargine, 1 Unit Dial, (TOUJEO SOLOSTAR) 300 UNIT/ML Solostar Pen Inject 15 Units into the skin daily. 03/02/20  Yes Paz, Alda Berthold, MD  losartan (COZAAR) 100 MG tablet Take 1 tablet (100 mg total) by mouth daily. 10/01/18  Yes Minus Breeding, MD  metFORMIN (GLUCOPHAGE) 1000 MG tablet Take 1 tablet (1,000 mg total) by mouth 2 (two) times daily with a meal. 10/22/18  Yes Wendling, Crosby Oyster, DO  metoprolol succinate (TOPROL-XL) 100 MG 24 hr tablet Take 1.5 tablets (150 mg total) by mouth daily. 02/10/20  Yes Paz, Alda Berthold, MD  mometasone (NASONEX) 50 MCG/ACT nasal spray PLACE 2 SPRAYS INTO THE NOSE DAILY 10/10/18  Yes  Shelda Pal, DO  rosuvastatin (CRESTOR) 40 MG tablet Take 1 tablet (40 mg total) by mouth daily. 05/19/20  Yes Paz, Alda Berthold, MD  tamsulosin (FLOMAX) 0.4 MG CAPS capsule TAKE 1 CAPSULE(0.4 MG) BY MOUTH DAILY Patient taking differently: Take 0.4 mg by mouth daily.  10/10/18  Yes Shelda Pal, DO  blood glucose meter kit and supplies Dispense based on patient and insurance preference. Check blood sugars twice daily. Dx: E11.9 02/10/20   Colon Branch, MD  Blood Glucose Monitoring Suppl (ONETOUCH VERIO IQ SYSTEM) w/Device KIT Use to check blood sugar 2 times per day. 10/09/16   Renato Shin, MD  ezetimibe (  ZETIA) 10 MG tablet Take 1 tablet (10 mg total) by mouth daily. Patient not taking: Reported on 06/09/2020 07/15/15   Janith Lima, MD  fluticasone [facility]) 50 MCG/ACT nasal spray Place 2 sprays into both nostrils daily. Patient not taking: Reported on 06/09/2020 05/16/18   Shelda Pal, DO  glucose blood [facility] VERIO) test strip Use to check blood sugar 2 times per day 10/09/16   Renato Shin, MD  Insulin Pen Needle (BD PEN NEEDLE NANO U/F) 32G X 4 MM MISC use as directed with INSULIN 11/10/13   Janith Lima, MD  [facility] DELICA LANCETS 14J MISC Use to check blood sugar 2 times per day. 10/09/16   Renato Shin, MD    Physical Exam: Vitals:   06/09/20 1833 06/09/20 2000 06/09/20 2019 06/09/20 2117  BP: 123/63 119/63  (!) 123/54  Pulse: 74 75  72  Resp: _0 Temp:   98.7 F (37.1 C) 98.5 F (36.9 C)  TempSrc:   Oral Oral  SpO2: 100% 99%  98%      Constitutional: Acutely ill looking no distress Vitals:   06/09/20 1833 06/09/20 2000 06/09/20 2019 06/09/20 2117  BP: 123/63 119/63  (!) 123/54  Pulse: 74 75  72  Resp: _1 Temp:   98.7 F (37.1 C) 98.5 F (36.9 C)  TempSrc:   Oral Oral  SpO2: 100% 99%  98%   Eyes: PERRL, lids and conjunctivae normal ENMT: Mucous membranes are dry. Posterior pharynx clear of any exudate or  lesions.Normal dentition.  Neck: normal, supple, no masses, no thyromegaly Respiratory: clear to auscultation bilaterally, no wheezing, no crackles. Normal respiratory effort. No accessory muscle use.  Cardiovascular: Regular rate and rhythm, no murmurs / rubs / gallops. No extremity edema. 2+ pedal pulses. No carotid bruits.  Abdomen: Diffuse tenderness, no masses palpated. No hepatosplenomegaly. Bowel sounds positive.  Musculoskeletal: no clubbing / cyanosis. No joint deformity upper and lower extremities. Good ROM, no contractures. Normal muscle tone.  Skin: no rashes, lesions, ulcers. No induration Neurologic: CN 2-12 grossly intact. Sensation intact, DTR normal. Strength 5/5 in all 4.  Psychiatric: Normal judgment and insight. Alert and oriented x 3. Normal mood.     Labs on Admission: I have personally reviewed following labs and imaging studies  CBC: Recent Labs  Lab 06/09/20 1348  WBC 5.8  HGB 13.1  HCT 39.1  MCV 90.3  PLT 092*   Basic Metabolic Panel: Recent Labs  Lab 06/07/20 1622 06/09/20 1348  NA 134* 137  K 4.4 4.5  CL 99 101  CO2 21 25  GLUCOSE 356* 241*  BUN 37* 30*  CREATININE 2.69* 2.60*  CALCIUM 9.3 9.8   GFR: Estimated Creatinine Clearance: 20.2 mL/min (A) (by C-G formula based on SCr of 2.6 mg/dL (H)). Liver Function Tests: Recent Labs  Lab 06/09/20 1348  AST 246*  ALT 481*  ALKPHOS 131*  BILITOT 0.4  PROT 6.8  ALBUMIN 3.5   Recent Labs  Lab 06/09/20 1348  LIPASE 175*   No results for input(s): AMMONIA in the last 168 hours. Coagulation Profile: No results for input(s): INR, PROTIME in the last 168 hours. Cardiac Enzymes: No results for input(s): CKTOTAL, CKMB, CKMBINDEX, TROPONINI in the last 168 hours. BNP (last 3 results) No results for input(s): PROBNP in the last 8760 hours. HbA1C: Recent Labs    06/07/20 1622  HGBA1C 7.0*   CBG: No results for input(s): GLUCAP in the last 168 hours.  Lipid Profile: No results for  input(s): CHOL, HDL, LDLCALC, TRIG, CHOLHDL, LDLDIRECT in the last 72 hours. Thyroid Function Tests: No results for input(s): TSH, T4TOTAL, FREET4, T3FREE, THYROIDAB in the last 72 hours. Anemia Panel: No results for input(s): VITAMINB12, FOLATE, FERRITIN, TIBC, IRON, RETICCTPCT in the last 72 hours. Urine analysis:    Component Value Date/Time   COLORURINE YELLOW 06/09/2020 2008   APPEARANCEUR CLEAR 06/09/2020 2008   LABSPEC 1.015 06/09/2020 2008   PHURINE 7.0 06/09/2020 2008   GLUCOSEU >=500 (A) 06/09/2020 2008   GLUCOSEU >=1000 (A) 02/07/2016 0914   HGBUR SMALL (A) 06/09/2020 2008   BILIRUBINUR NEGATIVE 06/09/2020 2008   KETONESUR NEGATIVE 06/09/2020 2008   PROTEINUR NEGATIVE 06/09/2020 2008   UROBILINOGEN 0.2 02/07/2016 0914   NITRITE NEGATIVE 06/09/2020 2008   LEUKOCYTESUR NEGATIVE 06/09/2020 2008   Sepsis Labs: _0 (procalcitonin:4,lacticidven:4) ) Recent Results (from the past 240 hour(s))  Respiratory Panel by RT PCR (Flu A&B, Covid) - Nasopharyngeal Swab     Status: None   Collection Time: 06/09/20  6:54 PM   Specimen: Nasopharyngeal Swab  Result Value Ref Range Status   SARS Coronavirus 2 by RT PCR NEGATIVE NEGATIVE Final    Comment: (NOTE) SARS-CoV-2 target nucleic acids are NOT DETECTED.  The SARS-CoV-2 RNA is generally detectable in upper respiratoy specimens during the acute phase of infection. The lowest concentration of SARS-CoV-2 viral copies this assay can detect is 131 copies/mL. A negative result does not preclude SARS-Cov-2 infection and should not be used as the sole basis for treatment or other patient management decisions. A negative result may occur with  improper specimen collection/handling, submission of specimen other than nasopharyngeal swab, presence of viral mutation(s) within the areas targeted by this assay, and inadequate number of viral copies (<131 copies/mL). A negative result must be combined with clinical observations, patient  history, and epidemiological information. The expected result is Negative.  Fact Sheet for Patients:  PinkCheek.be  Fact Sheet for Healthcare Providers:  GravelBags.it  This test is no t yet approved or cleared by the [place] FDA and  has been authorized for detection and/or diagnosis of SARS-CoV-2 by FDA under an Emergency Use Authorization (EUA). This EUA will remain  in effect (meaning this test can be used) for the duration of the COVID-19 declaration under Section 564(b)(1) of the Act, 21 U.S.C. section 360bbb-3(b)(1), unless the authorization is terminated or revoked sooner.     Influenza A by PCR NEGATIVE NEGATIVE Final   Influenza B by PCR NEGATIVE NEGATIVE Final    Comment: (NOTE) The Xpert Xpress SARS-CoV-2/FLU/RSV assay is intended as an aid in  the diagnosis of influenza from Nasopharyngeal swab specimens and  should not be used as a sole basis for treatment. Nasal washings and  aspirates are unacceptable for Xpert Xpress SARS-CoV-2/FLU/RSV  testing.  Fact Sheet for Patients: PinkCheek.be  Fact Sheet for Healthcare Providers: GravelBags.it  This test is not yet approved or cleared by the [place] FDA and  has been authorized for detection and/or diagnosis of SARS-CoV-2 by  FDA under an Emergency Use Authorization (EUA). This EUA will remain  in effect (meaning this test can be used) for the duration of the  Covid-19 declaration under Section 564(b)(1) of the Act, 21  U.S.C. section 360bbb-3(b)(1), unless the authorization is  terminated or revoked. Performed at [facility], [address] [place], [place] [address]      Radiological Exams on Admission: CT ABDOMEN PELVIS WO CONTRAST  Result Date:  06/09/2020 CLINICAL DATA:  Nausea and vomiting beginning 5 days ago. Abdominal pain. EXAM: CT ABDOMEN AND PELVIS WITHOUT  CONTRAST TECHNIQUE: Multidetector CT imaging of the abdomen and pelvis was performed following the standard protocol without IV contrast. COMPARISON:  06/14/2019 FINDINGS: Lower chest: Chronic scarring at the lung bases, right more than left. No active process identified. Hepatobiliary: Normal appearance of the liver without contrast. No calcified gallstones. Few scattered punctate arterial calcifications. Pancreas: Normal Spleen: Normal Adrenals/Urinary Tract: Adrenal glands are normal. Kidneys are normal. Extensive arterial calcification. Bladder is normal. Stomach/Bowel: Stomach and small intestine appear normal. Normal appendix. Moderate amount of fecal matter in the colon, within normal limits. Evidence of diverticulitis or obstruction Vascular/Lymphatic: Extensive aortic atherosclerosis and branch vessel atherosclerosis. IVC is normal. No adenopathy. Reproductive: Normal Other: No free fluid or air. Musculoskeletal: Negative IMPRESSION: 1. No acute finding to explain the clinical presentation. No evidence of bowel obstruction or other acute bowel pathology. 2. Extensive aortic atherosclerosis and branch vessel atherosclerosis. 3. Chronic scarring at the lung bases, right more than left. Aortic Atherosclerosis (ICD10-I70.0). Electronically Signed   By: Nelson Chimes M.D.   On: 06/09/2020 15:12   US Abdomen Limited  Result Date: 06/09/2020 CLINICAL DATA:  Increased LFTs EXAM: ULTRASOUND ABDOMEN LIMITED RIGHT UPPER QUADRANT COMPARISON:  None. FINDINGS: Gallbladder: No gallstones or wall thickening visualized. No sonographic Murphy sign noted by sonographer. Common bile duct: Diameter: 14 mm Liver: No focal lesion identified. Within normal limits in parenchymal echogenicity. Portal vein is patent on color Doppler imaging with normal direction of blood flow towards the liver. Other: None. IMPRESSION: Normal right upper quadrant ultrasound Electronically Signed   By: Prudencio Pair M.D.   On: 06/09/2020 17:52       Assessment/Plan Principal Problem:   AKI (acute kidney injury) ([place])     #1 AKI on CKD 3: Patient will be admitted and hydrated aggressively. Most likely prerenal from his nausea vomiting. Once renal function improves and back to normal patient will then be transition to home regimen at discharge. Hold nephrotoxic medications.  #2 abdominal pain: Negative imaging but elevated lipase. Could be some form of pancreatitis. Patient has history of hyperlipidemia. Will check fasting lipid panel. No evidence of gallstones. Patient could also have ischemic bowel but less likely based on the CT findings. We will treat symptomatically. Trial of clear liquid diet.  #3 COPD: No exacerbation.  #4 diabetes: Sliding scale insulin  #5 hyperlipidemia: Check fasting lipid panel. Resume home regimen if tolerated     DVT prophylaxis: Heparin Code Status: Full code Family Communication: No family at bedside Disposition Plan: Home Consults called: None Admission status: Inpatient  Severity of Illness: The appropriate patient status for this patient is INPATIENT. Inpatient status is judged to be reasonable and necessary in order to provide the required intensity of service to ensure the patient's safety. The patient's presenting symptoms, physical exam findings, and initial radiographic and laboratory data in the context of their chronic comorbidities is felt to place them at high risk for further clinical deterioration. Furthermore, it is not anticipated that the patient will be medically stable for discharge from the hospital within 2 midnights of admission. The following factors support the patient status of inpatient.   " The patient's presenting symptoms include abdominal pain nausea vomiting. " The worrisome physical exam findings include dehydration with abdominal tenderness. " The initial radiographic and laboratory data are worrisome because of elevated creatinine. " The chronic  co-morbidities include diabetes with hypertension.   *  I certify that at the point of admission it is my clinical judgment that the patient will require inpatient hospital care spanning beyond 2 midnights from the point of admission due to high intensity of service, high risk for further deterioration and high frequency of surveillance required.Barbette Merino MD [facility] Pager [phone]  If 7PM-7AM, please contact night-coverage [email or web address] Password [facility]  06/09/2020, 10:15 PM

## 2020-06-09 NOTE — Telephone Encounter (Signed)
LMOM informing to return call regarding labs.

## 2020-06-09 NOTE — Telephone Encounter (Signed)
Pt currently in ED.

## 2020-06-09 NOTE — ED Provider Notes (Signed)
Signout note  74 year old abdominal pain, vomiting.  Labs concerning for AKI, transaminitis, elevated lipase.  CT abdomen pelvis negative for acute pathology.  3:30 PM Received signout from Dr. Regenia Skeeter, follow-up right quadrant ultrasound, lactate, reassess patient  6:52 PM Lactate mildly elevated, right upper quadrant ultrasound negative, reassessed patient, he has no ongoing pain, he has a completely benign abdominal exam with no ongoing pain, suspect lab derangements most likely from dehydration, low suspicion for mesenteric ischemia.  Believe he would benefit from further observation, rehydration and repeat of his blood work tomorrow morning.  Discussed case with hospitalist, Dr. Jonelle Sidle who will accept patient.   Lucrezia Starch, MD 06/09/20 [phone]

## 2020-06-09 NOTE — Progress Notes (Signed)
Pt stated he notified them

## 2020-06-09 NOTE — Progress Notes (Signed)
MD on call notified that patient is here on the floor  Will continue to monitor

## 2020-06-09 NOTE — ED Triage Notes (Signed)
Pt c/o abd pain and vomited x 2 11/13-NAD-steady gait

## 2020-06-10 DIAGNOSIS — E785 Hyperlipidemia, unspecified: Secondary | ICD-10-CM

## 2020-06-10 DIAGNOSIS — I2510 Atherosclerotic heart disease of native coronary artery without angina pectoris: Secondary | ICD-10-CM

## 2020-06-10 DIAGNOSIS — K859 Acute pancreatitis without necrosis or infection, unspecified: Principal | ICD-10-CM | POA: Diagnosis present

## 2020-06-10 DIAGNOSIS — K219 Gastro-esophageal reflux disease without esophagitis: Secondary | ICD-10-CM

## 2020-06-10 LAB — LIPID PANEL
Cholesterol: 77 mg/dL (ref 0–200)
HDL: 26 mg/dL — ABNORMAL LOW (ref >40)
LDL Cholesterol: 19 mg/dL (ref 0–99)
Total CHOL/HDL Ratio: 3 RATIO
Triglycerides: 159 mg/dL — ABNORMAL HIGH (ref <150)
VLDL: 32 mg/dL (ref 0–40)

## 2020-06-10 LAB — COMPREHENSIVE METABOLIC PANEL
ALT: 340 U/L — ABNORMAL HIGH (ref 0–44)
AST: 166 U/L — ABNORMAL HIGH (ref 15–41)
Albumin: 3.3 g/dL — ABNORMAL LOW (ref 3.5–5.0)
Alkaline Phosphatase: 109 U/L (ref 38–126)
Anion gap: 10 (ref 5–15)
BUN: 22 mg/dL (ref 8–23)
CO2: 23 mmol/L (ref 22–32)
Calcium: 9.2 mg/dL (ref 8.9–10.3)
Chloride: 108 mmol/L (ref 98–111)
Creatinine, Ser: 1.95 mg/dL — ABNORMAL HIGH (ref 0.61–1.24)
GFR, Estimated: 36 mL/min — ABNORMAL LOW (ref >60)
Glucose, Bld: 137 mg/dL — ABNORMAL HIGH (ref 70–99)
Potassium: 3.8 mmol/L (ref 3.5–5.1)
Sodium: 141 mmol/L (ref 135–145)
Total Bilirubin: 0.7 mg/dL (ref 0.3–1.2)
Total Protein: 6 g/dL — ABNORMAL LOW (ref 6.5–8.1)

## 2020-06-10 LAB — CBC
HCT: 38.5 % — ABNORMAL LOW (ref 39.0–52.0)
Hemoglobin: 12.8 g/dL — ABNORMAL LOW (ref 13.0–17.0)
MCH: 30.8 pg (ref 26.0–34.0)
MCHC: 33.2 g/dL (ref 30.0–36.0)
MCV: 92.8 fL (ref 80.0–100.0)
Platelets: 147 10*3/uL — ABNORMAL LOW (ref 150–400)
RBC: 4.15 MIL/uL — ABNORMAL LOW (ref 4.22–5.81)
RDW: 13.5 % (ref 11.5–15.5)
WBC: 5.3 10*3/uL (ref 4.0–10.5)
nRBC: 0 % (ref 0.0–0.2)

## 2020-06-10 LAB — GLUCOSE, CAPILLARY
Glucose-Capillary: 123 mg/dL — ABNORMAL HIGH (ref 70–99)
Glucose-Capillary: 188 mg/dL — ABNORMAL HIGH (ref 70–99)
Glucose-Capillary: 184 mg/dL — ABNORMAL HIGH (ref 70–99)
Glucose-Capillary: 236 mg/dL — ABNORMAL HIGH (ref 70–99)

## 2020-06-10 LAB — HEPATITIS PANEL, ACUTE
HCV Ab: NONREACTIVE
Hep A IgM: NONREACTIVE
Hep B C IgM: NONREACTIVE
Hepatitis B Surface Ag: NONREACTIVE

## 2020-06-10 LAB — LIPASE, BLOOD: Lipase: 135 U/L — ABNORMAL HIGH (ref 11–51)

## 2020-06-10 MED ORDER — SUCRALFATE 1 G PO TABS
1.0000 g | ORAL_TABLET | Freq: Three times a day (TID) | ORAL | Status: DC
  Administered 2020-06-10 – 2020-06-12 (×7): 1 g via ORAL
  Filled 2020-06-10 (×7): qty 1

## 2020-06-10 MED ORDER — FAMOTIDINE IN NACL 20-0.9 MG/50ML-% IV SOLN
20.0000 mg | Freq: Two times a day (BID) | INTRAVENOUS | Status: DC
  Administered 2020-06-10 – 2020-06-12 (×5): 20 mg via INTRAVENOUS
  Filled 2020-06-10 (×6): qty 50

## 2020-06-10 NOTE — Progress Notes (Signed)
PROGRESS NOTE    Steven [name]  RKY:[id number] DOB: [birth year] DOA: 06/09/2020 PCP: Colon Branch, MD    Brief Narrative:  74 year old gentleman with extensive medical issues including type 2 diabetes on insulin, hypertension, coronary artery disease, hyper lipidemia, COPD, atrial fibrillation and GERD who came to the ER with sudden onset of abdominal pain, nausea and vomiting.  Also had bilateral upper quadrant abdominal pain.  No recent fever.  No recent exposure.  He has CKD stage IIIa and has outpatient follow-up. In the emergency room hemodynamically stable.  He was found to have creatinine of 2.6, baseline creatinine about 1.5.  Lipase 175.  Total bilirubin normal.  Transaminases elevated to 400s.   Assessment & Plan:   Principal Problem:   Pancreatitis, acute Active Problems:   CAD (coronary artery disease)   Benign essential hypertension   GERD (gastroesophageal reflux disease)   Chronic obstructive pulmonary disease ([name])   Hyperlipidemia with target LDL less than 70   AKI (acute kidney injury) ([place])  #1 .right upper quadrant abdominal pain/elevated transaminases with normal bilirubin/elevated lipase: Exact cause unknown. Possible low-grade pancreatitis, acute viral hepatitis.  Already clinically stabilizing and levels trending down. Acute hepatitis panel negative. CT scan abdomen pelvis with no evidence of pancreatic abnormality, bile duct normal. Right upper quadrant ultrasound with normal gallbladder.  No stone. Received flu shot on 10/15 and started having symptoms, this may be viral syndrome.  Also has significant history of GERD, not on any PPI.  Plan: Symptomatic treatment.  Continue IV fluid.  Encourage oral intake.  We will try Pepcid IV as he has significant reflux symptoms.  Will discharge home on oral PPI. Since his LFTs and lipase is already improving, do not anticipate further investigations. He drinks about 2 drinks of wine every day, we discussed about  complete alcohol cessation. Hold Crestor until LFTs normalize.  #2. acute kidney injury with history of chronic kidney disease stage IIIa: Baseline creatinine about 1.5-1.7.  Treating with IV fluids.  No evidence of obstruction.  Continue IV fluids today.  Hold losartan until then.  Recheck tomorrow morning.  #3. Type 2 diabetes, well controlled: On insulin.  Continue same doses.  #4.  Hypertension: Blood pressure is stable.  Holding losartan for AKI.  Continue other medications.  #5. CAD: Stable.  No evidence of acute coronary syndrome.   DVT prophylaxis: heparin injection 5,000 Units Start: 06/09/20 2315   Code Status: Full code Family Communication: None Disposition Plan: Status is: Inpatient  Remains inpatient appropriate because:Inpatient level of care appropriate due to severity of illness   Dispo: The patient is from: Home              Anticipated d/c is to: Home              Anticipated d/c date is: 2 days              Patient currently is not medically stable to d/c.         Consultants:   None  Procedures:   None  Antimicrobials:   None   Subjective: Patient seen and examined.  Abdomen pain is better.  Is more worried about burning and hurting sensation when he swallows food.  Normal bowel movement.  Afebrile.  Objective: Vitals:   06/09/20 2120 06/10/20 0146 06/10/20 0557 06/10/20 1331  BP: (!) 123/54 125/63 (!) 150/65 (!) 113/54  Pulse: 72 70 67 (!) 57  Resp: 19 17 17 18   Temp: 98.4 F (36.9  C) 98.3 F (36.8 C) 98.2 F (36.8 C) 98.8 F (37.1 C)  TempSrc: Oral Oral Oral Oral  SpO2:  97% 100% 97%  Weight: 55.4 kg     Height: 5\' 5"  (1.651 m)       Intake/Output Summary (Last 24 hours) at 06/10/2020 1351 Last data filed at 06/10/2020 1000 Gross per 24 hour  Intake 1304.22 ml  Output 700 ml  Net 604.22 ml   Filed Weights   06/09/20 2120  Weight: 55.4 kg    Examination:  General exam: Appears calm and comfortable  Respiratory  system: Clear to auscultation. Respiratory effort normal. Cardiovascular system: S1 & S2 heard, RRR. No JVD, murmurs, rubs, gallops or clicks. No pedal edema. Gastrointestinal system: Abdomen is nondistended, soft and nontender. No organomegaly or masses felt. Normal bowel sounds heard.  No rigidity or guarding. Central nervous system: Alert and oriented. No focal neurological deficits. Extremities: Symmetric 5 x 5 power. Skin: No rashes, lesions or ulcers Psychiatry: Judgement and insight appear normal. Mood & affect appropriate.     Data Reviewed: I have personally reviewed following labs and imaging studies  CBC: Recent Labs  Lab 06/09/20 1348 06/10/20 0457  WBC 5.8 5.3  HGB 13.1 12.8*  HCT 39.1 38.5*  MCV 90.3 92.8  PLT 148* 786*   Basic Metabolic Panel: Recent Labs  Lab 06/07/20 1622 06/09/20 1348 06/10/20 0457  NA 134* 137 141  K 4.4 4.5 3.8  CL 99 101 108  CO2 21 25 23   GLUCOSE 356* 241* 137*  BUN 37* 30* 22  CREATININE 2.69* 2.60* 1.95*  CALCIUM 9.3 9.8 9.2   GFR: Estimated Creatinine Clearance: 26.8 mL/min (A) (by C-G formula based on SCr of 1.95 mg/dL (H)). Liver Function Tests: Recent Labs  Lab 06/09/20 1348 06/10/20 0457  AST 246* 166*  ALT 481* 340*  ALKPHOS 131* 109  BILITOT 0.4 0.7  PROT 6.8 6.0*  ALBUMIN 3.5 3.3*   Recent Labs  Lab 06/09/20 1348 06/10/20 0457  LIPASE 175* 135*   No results for input(s): AMMONIA in the last 168 hours. Coagulation Profile: No results for input(s): INR, PROTIME in the last 168 hours. Cardiac Enzymes: No results for input(s): CKTOTAL, CKMB, CKMBINDEX, TROPONINI in the last 168 hours. BNP (last 3 results) No results for input(s): PROBNP in the last 8760 hours. HbA1C: Recent Labs    06/07/20 1622  HGBA1C 7.0*   CBG: Recent Labs  Lab 06/09/20 2357 06/10/20 0737 06/10/20 1149  GLUCAP 75 123* 188*   Lipid Profile: Recent Labs    06/10/20 0704  CHOL 77  HDL 26*  LDLCALC 19  TRIG 159*    CHOLHDL 3.0   Thyroid Function Tests: No results for input(s): TSH, T4TOTAL, FREET4, T3FREE, THYROIDAB in the last 72 hours. Anemia Panel: No results for input(s): VITAMINB12, FOLATE, FERRITIN, TIBC, IRON, RETICCTPCT in the last 72 hours. Sepsis Labs: Recent Labs  Lab 06/09/20 1617  LATICACIDVEN 2.8*    Recent Results (from the past 240 hour(s))  Respiratory Panel by RT PCR (Flu A&B, Covid) - Nasopharyngeal Swab     Status: None   Collection Time: 06/09/20  6:54 PM   Specimen: Nasopharyngeal Swab  Result Value Ref Range Status   SARS Coronavirus 2 by RT PCR NEGATIVE NEGATIVE Final    Comment: (NOTE) SARS-CoV-2 target nucleic acids are NOT DETECTED.  The SARS-CoV-2 RNA is generally detectable in upper respiratoy specimens during the acute phase of infection. The lowest concentration of SARS-CoV-2 viral copies this assay  can detect is 131 copies/mL. A negative result does not preclude SARS-Cov-2 infection and should not be used as the sole basis for treatment or other patient management decisions. A negative result may occur with  improper specimen collection/handling, submission of specimen other than nasopharyngeal swab, presence of viral mutation(s) within the areas targeted by this assay, and inadequate number of viral copies (<131 copies/mL). A negative result must be combined with clinical observations, patient history, and epidemiological information. The expected result is Negative.  Fact Sheet for Patients:  PinkCheek.be  Fact Sheet for Healthcare Providers:  GravelBags.it  This test is no t yet approved or cleared by the [place] FDA and  has been authorized for detection and/or diagnosis of SARS-CoV-2 by FDA under an Emergency Use Authorization (EUA). This EUA will remain  in effect (meaning this test can be used) for the duration of the COVID-19 declaration under Section 564(b)(1) of the Act, 21  U.S.C. section 360bbb-3(b)(1), unless the authorization is terminated or revoked sooner.     Influenza A by PCR NEGATIVE NEGATIVE Final   Influenza B by PCR NEGATIVE NEGATIVE Final    Comment: (NOTE) The Xpert Xpress SARS-CoV-2/FLU/RSV assay is intended as an aid in  the diagnosis of influenza from Nasopharyngeal swab specimens and  should not be used as a sole basis for treatment. Nasal washings and  aspirates are unacceptable for Xpert Xpress SARS-CoV-2/FLU/RSV  testing.  Fact Sheet for Patients: PinkCheek.be  Fact Sheet for Healthcare Providers: GravelBags.it  This test is not yet approved or cleared by the [place] FDA and  has been authorized for detection and/or diagnosis of SARS-CoV-2 by  FDA under an Emergency Use Authorization (EUA). This EUA will remain  in effect (meaning this test can be used) for the duration of the  Covid-19 declaration under Section 564(b)(1) of the Act, 21  U.S.C. section 360bbb-3(b)(1), unless the authorization is  terminated or revoked. Performed at [facility], [address] [place], [place] [address]          Radiology Studies: CT ABDOMEN PELVIS WO CONTRAST  Result Date: 06/09/2020 CLINICAL DATA:  Nausea and vomiting beginning 5 days ago. Abdominal pain. EXAM: CT ABDOMEN AND PELVIS WITHOUT CONTRAST TECHNIQUE: Multidetector CT imaging of the abdomen and pelvis was performed following the standard protocol without IV contrast. COMPARISON:  06/14/2019 FINDINGS: Lower chest: Chronic scarring at the lung bases, right more than left. No active process identified. Hepatobiliary: Normal appearance of the liver without contrast. No calcified gallstones. Few scattered punctate arterial calcifications. Pancreas: Normal Spleen: Normal Adrenals/Urinary Tract: Adrenal glands are normal. Kidneys are normal. Extensive arterial calcification. Bladder is normal. Stomach/Bowel:  Stomach and small intestine appear normal. Normal appendix. Moderate amount of fecal matter in the colon, within normal limits. Evidence of diverticulitis or obstruction Vascular/Lymphatic: Extensive aortic atherosclerosis and branch vessel atherosclerosis. IVC is normal. No adenopathy. Reproductive: Normal Other: No free fluid or air. Musculoskeletal: Negative IMPRESSION: 1. No acute finding to explain the clinical presentation. No evidence of bowel obstruction or other acute bowel pathology. 2. Extensive aortic atherosclerosis and branch vessel atherosclerosis. 3. Chronic scarring at the lung bases, right more than left. Aortic Atherosclerosis (ICD10-I70.0). Electronically Signed   By: Nelson Chimes M.D.   On: 06/09/2020 15:12   US Abdomen Limited  Result Date: 06/09/2020 CLINICAL DATA:  Increased LFTs EXAM: ULTRASOUND ABDOMEN LIMITED RIGHT UPPER QUADRANT COMPARISON:  None. FINDINGS: Gallbladder: No gallstones or wall thickening visualized. No sonographic Murphy sign noted by sonographer. Common  bile duct: Diameter: 14 mm Liver: No focal lesion identified. Within normal limits in parenchymal echogenicity. Portal vein is patent on color Doppler imaging with normal direction of blood flow towards the liver. Other: None. IMPRESSION: Normal right upper quadrant ultrasound Electronically Signed   By: Prudencio Pair M.D.   On: 06/09/2020 17:52        Scheduled Meds: . amLODipine  10 mg Oral Daily  . aspirin EC  81 mg Oral Daily  . fluticasone  1 spray Each Nare Daily  . heparin  5,000 Units Subcutaneous Q8H  . insulin aspart  0-15 Units Subcutaneous TID WC  . insulin aspart  0-5 Units Subcutaneous QHS  . insulin glargine  15 Units Subcutaneous Daily  . loratadine  10 mg Oral Daily  . metoprolol succinate  150 mg Oral Daily  . tamsulosin  0.4 mg Oral Daily   Continuous Infusions: . sodium chloride 125 mL/hr at 06/09/20 2315  . famotidine (PEPCID) IV 20 mg (06/10/20 1302)     LOS: 1 day     Time spent: 35 minutes    Barb Merino, MD [facility] Pager [phone]

## 2020-06-11 DIAGNOSIS — I10 Essential (primary) hypertension: Secondary | ICD-10-CM

## 2020-06-11 DIAGNOSIS — A084 Viral intestinal infection, unspecified: Secondary | ICD-10-CM

## 2020-06-11 DIAGNOSIS — K8500 Idiopathic acute pancreatitis without necrosis or infection: Secondary | ICD-10-CM

## 2020-06-11 DIAGNOSIS — R748 Abnormal levels of other serum enzymes: Secondary | ICD-10-CM

## 2020-06-11 LAB — COMPREHENSIVE METABOLIC PANEL
ALT: 275 U/L — ABNORMAL HIGH (ref 0–44)
ALT: 291 U/L — ABNORMAL HIGH (ref 0–44)
AST: 232 U/L — ABNORMAL HIGH (ref 15–41)
AST: 277 U/L — ABNORMAL HIGH (ref 15–41)
Albumin: 2.9 g/dL — ABNORMAL LOW (ref 3.5–5.0)
Albumin: 3.1 g/dL — ABNORMAL LOW (ref 3.5–5.0)
Alkaline Phosphatase: 100 U/L (ref 38–126)
Alkaline Phosphatase: 109 U/L (ref 38–126)
Anion gap: 3 — ABNORMAL LOW (ref 5–15)
Anion gap: 10 (ref 5–15)
BUN: 17 mg/dL (ref 8–23)
BUN: 14 mg/dL (ref 8–23)
CO2: 23 mmol/L (ref 22–32)
CO2: 20 mmol/L — ABNORMAL LOW (ref 22–32)
Calcium: 8.5 mg/dL — ABNORMAL LOW (ref 8.9–10.3)
Calcium: 8.8 mg/dL — ABNORMAL LOW (ref 8.9–10.3)
Chloride: 111 mmol/L (ref 98–111)
Chloride: 109 mmol/L (ref 98–111)
Creatinine, Ser: 1.57 mg/dL — ABNORMAL HIGH (ref 0.61–1.24)
Creatinine, Ser: 1.65 mg/dL — ABNORMAL HIGH (ref 0.61–1.24)
GFR, Estimated: 47 mL/min — ABNORMAL LOW (ref >60)
GFR, Estimated: 44 mL/min — ABNORMAL LOW (ref >60)
Glucose, Bld: 70 mg/dL (ref 70–99)
Glucose, Bld: 136 mg/dL — ABNORMAL HIGH (ref 70–99)
Potassium: 3.3 mmol/L — ABNORMAL LOW (ref 3.5–5.1)
Potassium: 3.8 mmol/L (ref 3.5–5.1)
Sodium: 137 mmol/L (ref 135–145)
Sodium: 139 mmol/L (ref 135–145)
Total Bilirubin: 0.4 mg/dL (ref 0.3–1.2)
Total Bilirubin: 0.6 mg/dL (ref 0.3–1.2)
Total Protein: 5.4 g/dL — ABNORMAL LOW (ref 6.5–8.1)
Total Protein: 5.7 g/dL — ABNORMAL LOW (ref 6.5–8.1)

## 2020-06-11 LAB — CBC
HCT: 35.5 % — ABNORMAL LOW (ref 39.0–52.0)
Hemoglobin: 12 g/dL — ABNORMAL LOW (ref 13.0–17.0)
MCH: 31 pg (ref 26.0–34.0)
MCHC: 33.8 g/dL (ref 30.0–36.0)
MCV: 91.7 fL (ref 80.0–100.0)
Platelets: 142 10*3/uL — ABNORMAL LOW (ref 150–400)
RBC: 3.87 MIL/uL — ABNORMAL LOW (ref 4.22–5.81)
RDW: 13.3 % (ref 11.5–15.5)
WBC: 5.2 10*3/uL (ref 4.0–10.5)
nRBC: 0 % (ref 0.0–0.2)

## 2020-06-11 LAB — LIPASE, BLOOD: Lipase: 149 U/L — ABNORMAL HIGH (ref 11–51)

## 2020-06-11 LAB — CBC WITH DIFFERENTIAL/PLATELET
Abs Immature Granulocytes: 0.01 10*3/uL (ref 0.00–0.07)
Basophils Absolute: 0 10*3/uL (ref 0.0–0.1)
Basophils Relative: 0 %
Eosinophils Absolute: 0.1 10*3/uL (ref 0.0–0.5)
Eosinophils Relative: 3 %
HCT: 33.4 % — ABNORMAL LOW (ref 39.0–52.0)
Hemoglobin: 11.4 g/dL — ABNORMAL LOW (ref 13.0–17.0)
Immature Granulocytes: 0 %
Lymphocytes Relative: 34 %
Lymphs Abs: 1.4 10*3/uL (ref 0.7–4.0)
MCH: 31 pg (ref 26.0–34.0)
MCHC: 34.1 g/dL (ref 30.0–36.0)
MCV: 90.8 fL (ref 80.0–100.0)
Monocytes Absolute: 0.6 10*3/uL (ref 0.1–1.0)
Monocytes Relative: 14 %
Neutro Abs: 2 10*3/uL (ref 1.7–7.7)
Neutrophils Relative %: 49 %
Platelets: 143 10*3/uL — ABNORMAL LOW (ref 150–400)
RBC: 3.68 MIL/uL — ABNORMAL LOW (ref 4.22–5.81)
RDW: 13.2 % (ref 11.5–15.5)
WBC: 4.1 10*3/uL (ref 4.0–10.5)
nRBC: 0 % (ref 0.0–0.2)

## 2020-06-11 LAB — MAGNESIUM
Magnesium: 1.7 mg/dL (ref 1.7–2.4)
Magnesium: 1.7 mg/dL (ref 1.7–2.4)

## 2020-06-11 LAB — GLUCOSE, CAPILLARY
Glucose-Capillary: 55 mg/dL — ABNORMAL LOW (ref 70–99)
Glucose-Capillary: 141 mg/dL — ABNORMAL HIGH (ref 70–99)
Glucose-Capillary: 216 mg/dL — ABNORMAL HIGH (ref 70–99)
Glucose-Capillary: 158 mg/dL — ABNORMAL HIGH (ref 70–99)
Glucose-Capillary: 204 mg/dL — ABNORMAL HIGH (ref 70–99)

## 2020-06-11 LAB — PHOSPHORUS
Phosphorus: 2.6 mg/dL (ref 2.5–4.6)
Phosphorus: 2.3 mg/dL — ABNORMAL LOW (ref 2.5–4.6)

## 2020-06-11 MED ORDER — FOLIC ACID 1 MG PO TABS
1.0000 mg | ORAL_TABLET | Freq: Every day | ORAL | Status: DC
  Administered 2020-06-11 – 2020-06-12 (×2): 1 mg via ORAL
  Filled 2020-06-11 (×2): qty 1

## 2020-06-11 MED ORDER — BENEPROTEIN PO POWD: 1.0000 | Freq: Three times a day (TID) | ORAL | Status: DC

## 2020-06-11 MED ORDER — LACTATED RINGERS IV SOLN: INTRAVENOUS | Status: DC

## 2020-06-11 MED ORDER — THIAMINE HCL 100 MG/ML IJ SOLN
100.0000 mg | Freq: Every day | INTRAMUSCULAR | Status: DC
  Filled 2020-06-11: qty 2

## 2020-06-11 MED ORDER — LORAZEPAM 1 MG PO TABS: 1.0000 mg | ORAL_TABLET | ORAL | Status: DC | PRN

## 2020-06-11 MED ORDER — ADULT MULTIVITAMIN W/MINERALS CH
1.0000 | ORAL_TABLET | Freq: Every day | ORAL | Status: DC
  Administered 2020-06-11 – 2020-06-12 (×2): 1 via ORAL
  Filled 2020-06-11 (×2): qty 1

## 2020-06-11 MED ORDER — THIAMINE HCL 100 MG PO TABS
100.0000 mg | ORAL_TABLET | Freq: Every day | ORAL | Status: DC
  Administered 2020-06-11 – 2020-06-12 (×2): 100 mg via ORAL
  Filled 2020-06-11 (×2): qty 1

## 2020-06-11 MED ORDER — DEXTROSE-NACL 5-0.45 % IV SOLN: INTRAVENOUS | Status: DC

## 2020-06-11 MED ORDER — RESOURCE INSTANT PROTEIN PO PWD PACKET
1.0000 | Freq: Three times a day (TID) | ORAL | Status: DC
  Administered 2020-06-12 (×2): 6 g via ORAL
  Filled 2020-06-11 (×4): qty 6

## 2020-06-11 MED ORDER — LORAZEPAM 2 MG/ML IJ SOLN: 1.0000 mg | INTRAMUSCULAR | Status: DC | PRN

## 2020-06-11 NOTE — Care Management Important Message (Signed)
Important Message  Patient Details IM Letter given to the Patient. Name: Steven [name] MRN: [id number] Date of Birth: [birth year]   Medicare Important Message Given:  Yes     Kerin Salen 06/11/2020, 12:38 PM

## 2020-06-11 NOTE — Progress Notes (Signed)
Initial Nutrition Assessment  INTERVENTION:   -Beneprotein powder TID with meals, each provides 25 kcals and 6g protein  Once diet advanced: -Glucerna Shake po TID, each supplement provides 220 kcal and 10 grams of protein  NUTRITION DIAGNOSIS:   Inadequate oral intake related to nausea, vomiting, poor appetite as evidenced by per patient/family report.  GOAL:   Patient will meet greater than or equal to 90% of their needs  MONITOR:   PO intake, Supplement acceptance, Labs, Weight trends, I & O's  REASON FOR ASSESSMENT:   Malnutrition Screening Tool    ASSESSMENT:   74 y.o. male with medical history significant of diabetes, hypertension, coronary artery disease, hypertension, peripheral vascular disease, COPD, atrial fibrillation, GERD who came in to the ER was sudden onset of abdominal pain nausea vomiting.  Patient reports his difficulties in eating began when he had his teeth pulled earlier this year and states it was hard to chew foods following that. Pt states it is difficult for him to chew some foods even with dentures. Pt began to have N/V on 11/13.  Patient consumed 100% of his meals yesterday when he was on a CHO modified diet. Pt states he has continued to have this painful sensation in his esophagus after he eats. States the clear liquids do the same thing. Concerned about the sugary beverages that come with his tray d/t his diabetes. Not interested in clear liquid Boost for this reason. Was agreeable to trying Beneprotein powder added to liquid of his choice for added protein. Recommend Glucerna once did is advanced past clears.  Per weight records, pt has lost 11 lbs since 6/18 (8% wt loss x 5 months, insignificant for time frame).   Medications: Folic acid, Multivitamin with minerals daily, Carafate, Thiamine, Lactated ringers   Labs reviewed: CBGs: 55-216 Low Phos  NUTRITION - FOCUSED PHYSICAL EXAM:    Most Recent Value  Orbital Region No depletion  Upper  Arm Region Mild depletion  Thoracic and Lumbar Region No depletion  Buccal Regionac No depletion  Temple Region Mild depletion  Clavicle Bone Region No depletion  Clavicle and Acromion Bone Region No depletion  Scapular Bone Region No depletion  Dorsal Hand No depletion  Patellar Region Unable to assess  Anterior Thigh Region Unable to assess  Posterior Calf Region Unable to assess  Edema (RD Assessment) None  Mouth --  [dentures]       Diet Order:   Diet Order            Diet clear liquid Room service appropriate? Yes; Fluid consistency: Thin  Diet effective now                 EDUCATION NEEDS:   No education needs have been identified at this time  Skin:  Skin Assessment: Reviewed RN Assessment  Last BM:  PTA  Height:   Ht Readings from Last 1 Encounters:  06/09/20 5\' 5"  (1.651 m)    Weight:   Wt Readings from Last 1 Encounters:  06/09/20 55.4 kg   BMI:  Body mass index is 20.32 kg/m.  Estimated Nutritional Needs:   Kcal:  1400-1600  Protein:  65-80g  Fluid:  1.6L/day   Clayton Bibles, MS, RD, LDN Inpatient Clinical Dietitian Contact information available via Amion

## 2020-06-11 NOTE — Consult Note (Addendum)
Referring Provider:  Dr. Tana Coast, [facility] Primary Care Physician:  Colon Branch, MD Primary Gastroenterologist:  Dr. Hilarie Fredrickson  Reason for Consultation:  Elevated lipase, elevated transaminases, epigastric pain  HPI: Steven [name] is a 74 y.o. male with medical history significant of diabetes, hypertension, coronary artery disease, hypertension, peripheral vascular disease, COPD, atrial fibrillation, GERD who came in to the ER was sudden onset of upper abdominal pain, nausea, vomiting.  He says that this started on Monday.  He had only 2 episodes of vomiting.  He describes the pain as burning and is in his upper abdomen.  He also relates that he has been having some diarrhea.   CT abdomen and pelvis without contrast:  IMPRESSION: 1. No acute finding to explain the clinical presentation. No evidence of bowel obstruction or other acute bowel pathology. 2. Extensive aortic atherosclerosis and branch vessel atherosclerosis. 3. Chronic scarring at the lung bases, right more than left.  Aortic Atherosclerosis (ICD10-I70.0).  Normal RUQ abdominal ultrasound.  Lipase is elevated 175 and today is down to 149.  AST is elevated at 277, ALT 290 Mom.  Alk phos is normal and total bili is normal.  5 months ago LFTs are normal except for an AST of 55.  He is currently tolerating full with diet and was anticipating going home today.  He does admit to drinking a couple glasses of wine every night.  Had colonoscopy with Dr. Hilarie Fredrickson in 11/2017 but has never had EGD.  Past Medical History:  Diagnosis Date  . Allergy   . Arthritis    Right Hip  . Atrial fibrillation ([place])   . BPH (benign prostatic hyperplasia)   . COPD (chronic obstructive pulmonary disease) ([place])   . Coronary artery disease    Lexiscan Myoview 7/14:  Inf scar with minimal reversibility, inf HK, EF 45%;  Low risk.  Promus LAD stent placed in [place].  100% RCA  . Diabetes 1.5, managed as type 1 ([place])   . Diverticulosis    on CT  .  Dyslipidemia   . GERD (gastroesophageal reflux disease)    has used Zantac in the past   . Glaucoma   . Hammer toes, bilateral   . Hernia   . Hyperlipidemia   . Hypertension   . Peripheral vascular disease ([place])    a. s/p R fem-pop BPG 01/2013 (Dr. [name])  . 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 ABDOMINAL AORTAGRAM N/A 12/10/2012   Procedure: ABDOMINAL Maxcine Ham;  Surgeon: Serafina Mitchell, MD;  Location: [facility] CATH LAB;  Service: Cardiovascular;  Laterality: N/A;  . CARDIAC CATHETERIZATION     with stent placement  . FEMORAL-POPLITEAL BYPASS GRAFT Right 02/07/2013   Procedure: BYPASS GRAFT FEMORAL-POPLITEAL ARTERY- RIGHT;  Surgeon: Mal Misty, MD;  Location: [facility] OR;  Service: Vascular;  Laterality: Right;  Right femoral to below knee popliteal bypass using right non reversed great saphenous vein  . INGUINAL HERNIA REPAIR  2010   Right  . INGUINAL HERNIA REPAIR  09/11/2018  . INTRAOPERATIVE ARTERIOGRAM Right 02/07/2013   Procedure: Attempted INTRA OPERATIVE ARTERIOGRAM;  Surgeon: Mal Misty, MD;  Location: [facility];  Service: Vascular;  Laterality: Right;  . IR RETROGRADE PYELOGRAM  12/10/2019  . TRANSURETHRAL RESECTION OF PROSTATE  12/10/2019    Prior to Admission medications   Medication Sig Start Date End Date Taking? Authorizing Provider  amLODipine (  NORVASC) 10 MG tablet Take 1 tablet (10 mg total) by mouth daily. 04/26/20  Yes Colon Branch, MD  aspirin EC 81 MG tablet Take 81 mg by mouth daily.   Yes [provider]  cetirizine (ZYRTEC) 10 MG tablet Take 10 mg by mouth daily. 03/22/20  Yes [provider]  empagliflozin (JARDIANCE) 10 MG TABS tablet Take 1 tablet (10 mg total) by mouth daily before breakfast. Patient taking differently: Take 10 mg by mouth every evening.  03/15/20  Yes Paz, Jacqulyn Bath E, MD  insulin glargine, 1 Unit Dial, (TOUJEO SOLOSTAR) 300 UNIT/ML  Solostar Pen Inject 15 Units into the skin daily. 03/02/20  Yes Paz, Alda Berthold, MD  losartan (COZAAR) 100 MG tablet Take 1 tablet (100 mg total) by mouth daily. 10/01/18  Yes Minus Breeding, MD  metFORMIN (GLUCOPHAGE) 1000 MG tablet Take 1 tablet (1,000 mg total) by mouth 2 (two) times daily with a meal. 10/22/18  Yes Wendling, Crosby Oyster, DO  metoprolol succinate (TOPROL-XL) 100 MG 24 hr tablet Take 1.5 tablets (150 mg total) by mouth daily. 02/10/20  Yes Paz, Alda Berthold, MD  mometasone (NASONEX) 50 MCG/ACT nasal spray PLACE 2 SPRAYS INTO THE NOSE DAILY 10/10/18  Yes Shelda Pal, DO  rosuvastatin (CRESTOR) 40 MG tablet Take 1 tablet (40 mg total) by mouth daily. 05/19/20  Yes Paz, Alda Berthold, MD  tamsulosin (FLOMAX) 0.4 MG CAPS capsule TAKE 1 CAPSULE(0.4 MG) BY MOUTH DAILY Patient taking differently: Take 0.4 mg by mouth daily.  10/10/18  Yes Shelda Pal, DO  blood glucose meter kit and supplies Dispense based on patient and insurance preference. Check blood sugars twice daily. Dx: E11.9 02/10/20   Colon Branch, MD  Blood Glucose Monitoring Suppl (ONETOUCH VERIO IQ SYSTEM) w/Device KIT Use to check blood sugar 2 times per day. 10/09/16   Renato Shin, MD  ezetimibe (ZETIA) 10 MG tablet Take 1 tablet (10 mg total) by mouth daily. Patient not taking: Reported on 06/09/2020 07/15/15   Janith Lima, MD  fluticasone [facility]) 50 MCG/ACT nasal spray Place 2 sprays into both nostrils daily. Patient not taking: Reported on 06/09/2020 05/16/18   Shelda Pal, DO  glucose blood [facility] VERIO) test strip Use to check blood sugar 2 times per day 10/09/16   Renato Shin, MD  Insulin Pen Needle (BD PEN NEEDLE NANO U/F) 32G X 4 MM MISC use as directed with INSULIN 11/10/13   Janith Lima, MD  [facility] DELICA LANCETS 80D MISC Use to check blood sugar 2 times per day. 10/09/16   Renato Shin, MD    Current Facility-Administered Medications  Medication Dose Route Frequency Provider Last  Rate Last Admin  . acetaminophen (TYLENOL) tablet 650 mg  650 mg Oral Q6H PRN Elwyn Reach, MD   650 mg at 06/10/20 2143   Or  . acetaminophen (TYLENOL) suppository 650 mg  650 mg Rectal Q6H PRN Gala Romney L, MD      . amLODipine (NORVASC) tablet 10 mg  10 mg Oral Daily Gala Romney L, MD   10 mg at 06/11/20 1013  . aspirin EC tablet 81 mg  81 mg Oral Daily Gala Romney L, MD   81 mg at 06/11/20 1013  . famotidine (PEPCID) IVPB 20 mg premix  20 mg Intravenous Q12H Barb Merino, MD 100 mL/hr at 06/11/20 1024 20 mg at 06/11/20 1024  . fluticasone (FLONASE) 50 MCG/ACT nasal spray 1 spray  1 spray Each Nare Daily Gala Romney  L, MD   1 spray at 06/11/20 1019  . folic acid (FOLVITE) tablet 1 mg  1 mg Oral Daily Rai, Ripudeep K, MD   1 mg at 06/11/20 1012  . heparin injection 5,000 Units  5,000 Units Subcutaneous Q8H Elwyn Reach, MD   5,000 Units at 06/10/20 2142  . insulin aspart (novoLOG) injection 0-15 Units  0-15 Units Subcutaneous TID WC Elwyn Reach, MD   5 Units at 06/11/20 1256  . insulin aspart (novoLOG) injection 0-5 Units  0-5 Units Subcutaneous QHS Garba, Mohammad L, MD      . insulin glargine (LANTUS) injection 15 Units  15 Units Subcutaneous Daily Elwyn Reach, MD   15 Units at 06/11/20 1017  . lactated ringers infusion   Intravenous Continuous Rai, Ripudeep K, MD 150 mL/hr at 06/11/20 1256 New Bag at 06/11/20 1256  . loratadine (CLARITIN) tablet 10 mg  10 mg Oral Daily Gala Romney L, MD   10 mg at 06/11/20 1013  . LORazepam (ATIVAN) tablet 1-4 mg  1-4 mg Oral Q1H PRN Rai, Vernelle Emerald, MD       Or  . LORazepam (ATIVAN) injection 1-4 mg  1-4 mg Intravenous Q1H PRN Rai, Ripudeep K, MD      . metoprolol succinate (TOPROL-XL) 24 hr tablet 150 mg  150 mg Oral Daily Gala Romney L, MD   150 mg at 06/11/20 1013  . multivitamin with minerals tablet 1 tablet  1 tablet Oral Daily Rai, Ripudeep K, MD   1 tablet at 06/11/20 1013  . ondansetron (ZOFRAN)  tablet 4 mg  4 mg Oral Q6H PRN Elwyn Reach, MD       Or  . ondansetron (ZOFRAN) injection 4 mg  4 mg Intravenous Q6H PRN Gala Romney L, MD      . protein supplement (RESOURCE BENEPROTEIN) powder packet 6 g  1 Scoop Oral TID WC Rai, Ripudeep K, MD      . sucralfate (CARAFATE) tablet 1 g  1 g Oral TID WC & HS Barb Merino, MD   1 g at 06/11/20 1257  . tamsulosin (FLOMAX) capsule 0.4 mg  0.4 mg Oral Daily Gala Romney L, MD   0.4 mg at 06/11/20 1013  . thiamine tablet 100 mg  100 mg Oral Daily Rai, Ripudeep K, MD   100 mg at 06/11/20 1013   Or  . thiamine (B-1) injection 100 mg  100 mg Intravenous Daily Rai, Ripudeep K, MD        Allergies as of 06/09/2020  . (No Known Allergies)    Family History  Problem Relation Age of Onset  . Diabetes Mother   . Hypertension Mother   . Heart disease Mother   . Hyperlipidemia Mother   . Diabetes Father   . Heart disease Father   . Hyperlipidemia Father   . Hypertension Father   . Diabetes Sister   . Heart disease Sister   . Hyperlipidemia Sister   . Hypertension Sister   . Diabetes Brother   . Heart disease Brother   . Hyperlipidemia Brother   . Hypertension Brother   . Diabetes Son   . Heart disease Son   . Hyperlipidemia Son   . Hypertension Son   . Pancreatic cancer Son   . Alcohol abuse Neg Hx   . Cancer Neg Hx   . Depression Neg Hx   . Early death Neg Hx   . Kidney disease Neg Hx   . Stroke Neg  Hx   . Colon cancer Neg Hx   . Esophageal cancer Neg Hx   . Rectal cancer Neg Hx   . Stomach cancer Neg Hx   . Liver cancer Neg Hx     Social History   Socioeconomic History  . Marital status: Married    Spouse name: Not on file  . Number of children: 5  . Years of education: Not on file  . Highest education level: Not on file  Occupational History  . Occupation: retired  . Occupation: Public affairs consultant  Tobacco Use  . Smoking status: Former Smoker    Packs/day: 0.30    Years: 50.00    Pack years:  15.00    Types: Cigarettes    Quit date: 02/04/2011    Years since quitting: 9.3  . Smokeless tobacco: Never Used  Vaping Use  . Vaping Use: Never used  Substance and Sexual Activity  . Alcohol use: Yes    Comment: wine daily  . Drug use: No  . Sexual activity: Not on file  Other Topics Concern  . Not on file  Social History Narrative   5 children , lost a son    Lives with wife.     Social Determinants of Health   Financial Resource Strain:   . Difficulty of Paying Living Expenses: Not on file  Food Insecurity:   . Worried About Charity fundraiser in the Last Year: Not on file  . Ran Out of Food in the Last Year: Not on file  Transportation Needs:   . Lack of Transportation (Medical): Not on file  . Lack of Transportation (Non-Medical): Not on file  Physical Activity:   . Days of Exercise per Week: Not on file  . Minutes of Exercise per Session: Not on file  Stress:   . Feeling of Stress : Not on file  Social Connections:   . Frequency of Communication with Friends and Family: Not on file  . Frequency of Social Gatherings with Friends and Family: Not on file  . Attends Religious Services: Not on file  . Active Member of Clubs or Organizations: Not on file  . Attends Archivist Meetings: Not on file  . Marital Status: Not on file  Intimate Partner Violence:   . Fear of Current or Ex-Partner: Not on file  . Emotionally Abused: Not on file  . Physically Abused: Not on file  . Sexually Abused: Not on file    Review of Systems: ROS is O/W negative except as mentioned in HPI.  Physical Exam: Vital signs in last 24 hours: Temp:  [97.9 F (36.6 C)-98.8 F (37.1 C)] 97.9 F (36.6 C) (11/19 0644) Pulse Rate:  [57-66] 57 (11/19 0644) Resp:  [17-18] 18 (11/19 0644) BP: (113-127)/(54-58) 125/58 (11/19 0644) SpO2:  [97 %-100 %] 100 % (11/19 0644)   General:  Alert, Well-developed, well-nourished, pleasant and cooperative in NAD Head:  Normocephalic and  atraumatic. Eyes:  Sclera clear, no icterus.  Conjunctiva pink. Ears:  Normal auditory acuity. Mouth:  No deformity or lesions. Lungs:  Clear throughout to auscultation.  No wheezes, crackles, or rhonchi.  Heart:  Regular rate and rhythm; no murmurs, clicks, rubs, or gallops. Abdomen:  Soft, non-distended.  BS present.  Non-tender. Msk:  Symmetrical without gross deformities. Pulses:  Normal pulses noted. Extremities:  Without clubbing or edema. Neurologic:  Alert and oriented x 4;  grossly normal neurologically. Skin:  Intact without significant lesions or rashes. Psych:  Alert  and cooperative. Normal mood and affect.  Intake/Output from previous day: 11/18 0701 - 11/19 0700 In: 3430 [P.O.:1080; I.V.:2250; IV Piggyback:100] Out: 2675 [Urine:2675] Intake/Output this shift: Total I/O In: 360 [P.O.:360] Out: 600 [Urine:600]  Lab Results: Recent Labs    06/10/20 0457 06/11/20 0611 06/11/20 1044  WBC 5.3 4.1 5.2  HGB 12.8* 11.4* 12.0*  HCT 38.5* 33.4* 35.5*  PLT 147* 143* 142*   BMET Recent Labs    06/10/20 0457 06/11/20 0611 06/11/20 1044  NA 141 137 139  K 3.8 3.3* 3.8  CL 108 111 109  CO2 23 23 20*  GLUCOSE 137* 70 136*  BUN '22 17 14  ' CREATININE 1.95* 1.57* 1.65*  CALCIUM 9.2 8.5* 8.8*   LFT Recent Labs    06/11/20 1044  PROT 5.7*  ALBUMIN 3.1*  AST 277*  ALT 291*  ALKPHOS 109  BILITOT 0.6   Hepatitis Panel Recent Labs    06/10/20 0457  HEPBSAG NON REACTIVE  HCVAB NON REACTIVE  HEPAIGM NON REACTIVE  HEPBIGM NON REACTIVE   Studies/Results: CT ABDOMEN PELVIS WO CONTRAST  Result Date: 06/09/2020 CLINICAL DATA:  Nausea and vomiting beginning 5 days ago. Abdominal pain. EXAM: CT ABDOMEN AND PELVIS WITHOUT CONTRAST TECHNIQUE: Multidetector CT imaging of the abdomen and pelvis was performed following the standard protocol without IV contrast. COMPARISON:  06/14/2019 FINDINGS: Lower chest: Chronic scarring at the lung bases, right more than left. No  active process identified. Hepatobiliary: Normal appearance of the liver without contrast. No calcified gallstones. Few scattered punctate arterial calcifications. Pancreas: Normal Spleen: Normal Adrenals/Urinary Tract: Adrenal glands are normal. Kidneys are normal. Extensive arterial calcification. Bladder is normal. Stomach/Bowel: Stomach and small intestine appear normal. Normal appendix. Moderate amount of fecal matter in the colon, within normal limits. Evidence of diverticulitis or obstruction Vascular/Lymphatic: Extensive aortic atherosclerosis and branch vessel atherosclerosis. IVC is normal. No adenopathy. Reproductive: Normal Other: No free fluid or air. Musculoskeletal: Negative IMPRESSION: 1. No acute finding to explain the clinical presentation. No evidence of bowel obstruction or other acute bowel pathology. 2. Extensive aortic atherosclerosis and branch vessel atherosclerosis. 3. Chronic scarring at the lung bases, right more than left. Aortic Atherosclerosis (ICD10-I70.0). Electronically Signed   By: Nelson Chimes M.D.   On: 06/09/2020 15:12   US Abdomen Limited  Result Date: 06/09/2020 CLINICAL DATA:  Increased LFTs EXAM: ULTRASOUND ABDOMEN LIMITED RIGHT UPPER QUADRANT COMPARISON:  None. FINDINGS: Gallbladder: No gallstones or wall thickening visualized. No sonographic Murphy sign noted by sonographer. Common bile duct: Diameter: 14 mm Liver: No focal lesion identified. Within normal limits in parenchymal echogenicity. Portal vein is patent on color Doppler imaging with normal direction of blood flow towards the liver. Other: None. IMPRESSION: Normal right upper quadrant ultrasound Electronically Signed   By: Prudencio Pair M.D.   On: 06/09/2020 17:52   IMPRESSION:  *Acute onset of upper abdominal pain associated with a couple of episodes of vomiting and some diarrhea.  Likely a viral illness/gastroenteritis. *Elevated LFTs with AST 277 and ALT 291, normal total bili and ALP.  5 months ago all  were normal except AST of 55.  Acute hepatitis panel is negative.  Possibly reactive in nature secondary to above. *Elevated lipase up to 175, now 149 today.  ? Also reactive or due to nausea/vomiting *Mild thrombocytopenia:  Platelets 143.  PLAN: -Continue to advance diet as tolerated.  If he is tolerating soft diet tomorrow and continues to feel well then he can be discharged home. -I will get him  an office follow-up with [place] at which time we can recheck his LFTs. -He should reduce his alcohol use for now until we see what his LFTs look like at follow-up.   Laban Emperor. Zehr  06/11/2020, 1:28 PM  GI ATTENDING  History, laboratories, x-rays reviewed.  Case reviewed and discussed in detail with GI physician assistant.  Agree with comprehensive consultation note as outlined above.  The patient's overall clinical picture is consistent with an acute gastroenteritis.  You can see mild elevations of lipase with gastroenteritis and other intra-abdominal processes.  He does not have pancreatitis.  In terms of his liver test abnormalities, these may be in part reactive and in part due to alcohol.  Clinically he has improved.  I recommend advancing diet as tolerated.  He should be ready for discharge home in the morning.  He can follow-up with his primary gastroenterologist, Dr. Hilarie Fredrickson as an outpatient to trend his liver test.  If abnormalities resolved, then no further work-up.  However if abnormalities persist, he may warrant additional work-up or investigation.  Thank you We will sign off.  Docia Chuck. Geri Seminole., M.D. [facility] Division of Gastroenterology

## 2020-06-11 NOTE — Progress Notes (Signed)
Inpatient Diabetes Program Recommendations  AACE/ADA: New Consensus Statement on Inpatient Glycemic Control (2015)  Target Ranges:  Prepandial:   less than 140 mg/dL      Peak postprandial:   less than 180 mg/dL (1-2 hours)      Critically ill patients:  140 - 180 mg/dL   Lab Results  Component Value Date   GLUCAP 216 (H) 06/11/2020   HGBA1C 7.0 (H) 06/07/2020    Review of Glycemic Control Results for CHEVON, LAUFER (MRN [id number]) as of 06/11/2020 14:32  Ref. Range 06/11/2020 08:05 06/11/2020 08:48 06/11/2020 11:54  Glucose-Capillary Latest Ref Range: 70 - 99 mg/dL 55 (L) 141 (H) 216 (H)   Diabetes history: Type 2 DM Outpatient Diabetes medications: Jardiance 10 mg QPM, Touejo 15 units QD, Metformin 1000 mg BID Current orders for Inpatient glycemic control: Lantus 15 units QD, Novolog 0-15 units TID, Novolog 0-5 units QHS  Inpatient Diabetes Program Recommendations:    Noted hypoglycemia of 55 mg/dL this AM. Unclear of last dose time of Toujeo, assuming partially related to renal status.  Could consider decreasing Lantus 12 units QD.   Thanks, Bronson Curb, MSN, RNC-OB Diabetes Coordinator [phone] (8a-5p)

## 2020-06-11 NOTE — Progress Notes (Signed)
[facility]                                                                              Patient Demographics  Steven [name], is a 74 y.o. male, DOB - 1945/08/07, STM:[id number]  Admit date - 06/09/2020   Admitting Physician Elwyn Reach, MD  Outpatient Primary MD for the patient is Colon Branch, MD  Outpatient specialists:   LOS - 2  days   Medical records reviewed and are as summarized below:    Chief Complaint  Patient presents with  . Abdominal Pain       Brief summary   74 year old gentleman with extensive medical issues including type 2 diabetes on insulin, hypertension, coronary artery disease, hyper lipidemia, COPD, atrial fibrillation and GERD who came to the ER with sudden onset of abdominal pain, nausea and vomiting.  Also had bilateral upper quadrant abdominal pain.  No recent fever.  No recent exposure.  He has CKD stage IIIa and has outpatient follow-up. In the emergency room hemodynamically stable.  He was found to have creatinine of 2.6, baseline creatinine about 1.5.  Lipase 175.  Total bilirubin normal.  Transaminases elevated to 400s. Patient was admitted for acute pancreatitis with transaminitis  Assessment & Plan    Principal Problem:   Pancreatitis, acute, likely due to alcohol use, epigastric pain -CT abdomen pelvis showed no acute findings, no gallstones.  Abdominal ultrasound normal, portal vein patent, liver Doppler normal. -Lipase 175 at the time of admission AST 246, ALT 481, total bilirubin 0.4 -Lipase improving, will start on clear liquid diet, continue aggressive IV fluid hydration -Triglycerides 159 -Patient continues to complain of epigastric pain, with anything he is eats or drinks.  Will consult GI.  It could be acute pancreatitis due to alcohol use however rule out any underlying gastritis/ulcer.  Active Problems: Elevated transaminitis -Unclear etiology, ALT 481 at the time of admission -> 291, AST 246> 166, alkaline  phosphatase 131 on admission-> 109, improving -Hepatitis panel negative, T bi within normal limits -Continue to hold statin -Consulted on alcohol cessation  Acute kidney injury, likely has CKD stage IIIa -Creatinine 2.6 at the time of admission->  1.6 -Continue IV fluid hydration -Continue to hold losartan, Metformin  Diabetes mellitus type 2, IDDM, -Continue sliding scale insulin Hemoglobin A1c 7.0 on 06/07/2020 Hold Metformin    CAD (coronary artery disease) -Currently no evidence of acute coronary syndrome, continue nebs  GERD Continue PPI  History of alcohol use Per patient, he drinks daily, counseled on alcohol cessation Placed on Ativan with CIWA protocol   Code Status: Full CODE STATUS DVT Prophylaxis: Heparin subcu Family Communication: Discussed all imaging results, lab results, explained to the patient    Disposition Plan:     Status is: Inpatient  Remains inpatient appropriate because:IV treatments appropriate due to intensity of illness or inability to take PO   Dispo:  Patient From: Home  Planned Disposition: Home  Expected discharge date: 06/12/20  Medically stable for discharge: No       Time Spent in minutes 35 minutes  Procedures:  None  Consultants:   Gastroenterology  Antimicrobials:  Anti-infectives (From admission, onward)   None          Medications  Scheduled Meds: . amLODipine  10 mg Oral Daily  . aspirin EC  81 mg Oral Daily  . fluticasone  1 spray Each Nare Daily  . folic acid  1 mg Oral Daily  . heparin  5,000 Units Subcutaneous Q8H  . insulin aspart  0-15 Units Subcutaneous TID WC  . insulin aspart  0-5 Units Subcutaneous QHS  . insulin glargine  15 Units Subcutaneous Daily  . loratadine  10 mg Oral Daily  . metoprolol succinate  150 mg Oral Daily  . multivitamin with minerals  1 tablet Oral Daily  . protein supplement  1 Scoop Oral TID WC  . sucralfate  1 g Oral TID WC & HS  . tamsulosin  0.4 mg Oral Daily   . thiamine  100 mg Oral Daily   Or  . thiamine  100 mg Intravenous Daily   Continuous Infusions: . famotidine (PEPCID) IV 20 mg (06/11/20 1024)  . lactated ringers     PRN Meds:.acetaminophen **OR** acetaminophen, LORazepam **OR** LORazepam, ondansetron **OR** ondansetron (ZOFRAN) IV      Subjective:   Steven [name] was seen and examined today.  Continues to complain of epigastric pain, 6/10, no active nausea or vomiting.no fevers or chills, chest pain or shortness of breath.   No acute events overnight.    Objective:   Vitals:   06/10/20 0557 06/10/20 1331 06/10/20 2117 06/11/20 0644  BP: (!) 150/65 (!) 113/54 (!) 127/54 (!) 125/58  Pulse: 67 (!) 57 66 (!) 57  Resp: 17 18 17 18   Temp: 98.2 F (36.8 C) 98.8 F (37.1 C) 98.2 F (36.8 C) 97.9 F (36.6 C)  TempSrc: Oral Oral    SpO2: 100% 97% 99% 100%  Weight:      Height:        Intake/Output Summary (Last 24 hours) at 06/11/2020 1226 Last data filed at 06/11/2020 1000 Gross per 24 hour  Intake 3550 ml  Output 2575 ml  Net 975 ml     Wt Readings from Last 3 Encounters:  06/09/20 55.4 kg  06/07/20 55.6 kg  06/02/20 56.7 kg     Exam  General: Alert and oriented x 3, NAD  Cardiovascular: S1 S2 auscultated, no murmurs, RRR  Respiratory: Clear to auscultation bilaterally, no wheezing, rales or rhonchi  Gastrointestinal: Soft, epigastric tenderness to deep palpation, ND, NBS  Ext: no pedal edema bilaterally  Neuro: no neuro deficits  Musculoskeletal: No digital cyanosis, clubbing  Skin: No rashes  Psych: Normal affect and demeanor, alert and oriented x3    Data Reviewed:  I have personally reviewed following labs and imaging studies  Micro Results Recent Results (from the past 240 hour(s))  Respiratory Panel by RT PCR (Flu A&B, Covid) - Nasopharyngeal Swab     Status: None   Collection Time: 06/09/20  6:54 PM   Specimen: Nasopharyngeal Swab  Result Value Ref Range Status   SARS Coronavirus 2  by RT PCR NEGATIVE NEGATIVE Final    Comment: (NOTE) SARS-CoV-2 target nucleic acids are NOT DETECTED.  The SARS-CoV-2 RNA is generally detectable in upper respiratoy specimens during the acute phase of infection. The lowest concentration of SARS-CoV-2 viral copies this assay can detect is 131 copies/mL. A negative result does not preclude SARS-Cov-2 infection and should not be used as the sole basis for treatment or other patient management decisions. A negative result may occur with  improper specimen collection/handling, submission of specimen other than nasopharyngeal swab, presence of viral mutation(s) within the areas targeted by this assay, and inadequate number of viral copies (<131 copies/mL). A negative result must be combined with clinical observations, patient history, and epidemiological information. The expected result is Negative.  Fact Sheet for Patients:  PinkCheek.be  Fact Sheet for Healthcare Providers:  GravelBags.it  This test is no t yet approved or cleared by the [place] FDA and  has been authorized for detection and/or diagnosis of SARS-CoV-2 by FDA under an Emergency Use Authorization (EUA). This EUA will remain  in effect (meaning this test can be used) for the duration of the COVID-19 declaration under Section 564(b)(1) of the Act, 21 U.S.C. section 360bbb-3(b)(1), unless the authorization is terminated or revoked sooner.     Influenza A by PCR NEGATIVE NEGATIVE Final   Influenza B by PCR NEGATIVE NEGATIVE Final    Comment: (NOTE) The Xpert Xpress SARS-CoV-2/FLU/RSV assay is intended as an aid in  the diagnosis of influenza from Nasopharyngeal swab specimens and  should not be used as a sole basis for treatment. Nasal washings and  aspirates are unacceptable for Xpert Xpress SARS-CoV-2/FLU/RSV  testing.  Fact Sheet for Patients: PinkCheek.be  Fact Sheet  for Healthcare Providers: GravelBags.it  This test is not yet approved or cleared by the [place] FDA and  has been authorized for detection and/or diagnosis of SARS-CoV-2 by  FDA under an Emergency Use Authorization (EUA). This EUA will remain  in effect (meaning this test can be used) for the duration of the  Covid-19 declaration under Section 564(b)(1) of the Act, 21  U.S.C. section 360bbb-3(b)(1), unless the authorization is  terminated or revoked. Performed at [facility], [address] [place], [place] [address]     Radiology Reports CT ABDOMEN PELVIS WO CONTRAST  Result Date: 06/09/2020 CLINICAL DATA:  Nausea and vomiting beginning 5 days ago. Abdominal pain. EXAM: CT ABDOMEN AND PELVIS WITHOUT CONTRAST TECHNIQUE: Multidetector CT imaging of the abdomen and pelvis was performed following the standard protocol without IV contrast. COMPARISON:  06/14/2019 FINDINGS: Lower chest: Chronic scarring at the lung bases, right more than left. No active process identified. Hepatobiliary: Normal appearance of the liver without contrast. No calcified gallstones. Few scattered punctate arterial calcifications. Pancreas: Normal Spleen: Normal Adrenals/Urinary Tract: Adrenal glands are normal. Kidneys are normal. Extensive arterial calcification. Bladder is normal. Stomach/Bowel: Stomach and small intestine appear normal. Normal appendix. Moderate amount of fecal matter in the colon, within normal limits. Evidence of diverticulitis or obstruction Vascular/Lymphatic: Extensive aortic atherosclerosis and branch vessel atherosclerosis. IVC is normal. No adenopathy. Reproductive: Normal Other: No free fluid or air. Musculoskeletal: Negative IMPRESSION: 1. No acute finding to explain the clinical presentation. No evidence of bowel obstruction or other acute bowel pathology. 2. Extensive aortic atherosclerosis and branch vessel atherosclerosis. 3. Chronic scarring  at the lung bases, right more than left. Aortic Atherosclerosis (ICD10-I70.0). Electronically Signed   By: Nelson Chimes M.D.   On: 06/09/2020 15:12   US Abdomen Limited  Result Date: 06/09/2020 CLINICAL DATA:  Increased LFTs EXAM: ULTRASOUND ABDOMEN LIMITED RIGHT UPPER QUADRANT COMPARISON:  None. FINDINGS: Gallbladder: No gallstones or wall thickening visualized. No sonographic Murphy sign noted by sonographer. Common bile duct: Diameter: 14 mm Liver: No focal lesion identified. Within normal limits in parenchymal echogenicity. Portal vein is patent on color Doppler imaging with normal direction of blood flow towards the liver. Other: None. IMPRESSION: Normal right upper quadrant ultrasound  Electronically Signed   By: Prudencio Pair M.D.   On: 06/09/2020 17:52   VAS [place] ABI WITH/WO TBI  Result Date: 06/02/2020 LOWER EXTREMITY DOPPLER STUDY Indications: Peripheral artery disease. High Risk Factors: Hypertension, hyperlipidemia, Diabetes.  Vascular Interventions: 02/07/2013: Right femoral- below knee popliteal bypass                         graft. Comparison Study: 04/16/2019: Rt- ABI 1.23, TBI 0.53; Lt- ABI 0.70, TBI 0.47. Performing Technologist: Ivan Croft  Examination Guidelines: A complete evaluation includes at minimum, Doppler waveform signals and systolic blood pressure reading at the level of bilateral brachial, anterior tibial, and posterior tibial arteries, when vessel segments are accessible. Bilateral testing is considered an integral part of a complete examination. Photoelectric Plethysmograph (PPG) waveforms and toe systolic pressure readings are included as required and additional duplex testing as needed. Limited examinations for reoccurring indications may be performed as noted.  ABI Findings: +---------+------------------+-----+---------+--------+ Right    Rt Pressure (mmHg)IndexWaveform Comment  +---------+------------------+-----+---------+--------+ Brachial 132                                       +---------+------------------+-----+---------+--------+ PTA      140               1.06 triphasic         +---------+------------------+-----+---------+--------+ DP       132               1.00 triphasic         +---------+------------------+-----+---------+--------+ Great Toe51                0.39                   +---------+------------------+-----+---------+--------+ +---------+------------------+-----+----------+-------+ Left     Lt Pressure (mmHg)IndexWaveform  Comment +---------+------------------+-----+----------+-------+ Brachial 131                                      +---------+------------------+-----+----------+-------+ PTA      64                0.48 monophasic        +---------+------------------+-----+----------+-------+ DP       75                0.57 monophasic        +---------+------------------+-----+----------+-------+ Great Toe0                 0.00 Absent            +---------+------------------+-----+----------+-------+ +-------+-----------+-----------+------------+------------+ ABI/TBIToday's ABIToday's TBIPrevious ABIPrevious TBI +-------+-----------+-----------+------------+------------+ Right  1.06       0.39       1.23        0.53         +-------+-----------+-----------+------------+------------+ Left   0.57       0          0.70        0.47         +-------+-----------+-----------+------------+------------+   Summary: Right: Resting right ankle-brachial index is within normal range. No evidence of significant right lower extremity arterial disease. The right toe-brachial index is abnormal. Left: Resting left ankle-brachial index indicates moderate left lower extremity arterial disease.  *See table(s) above for measurements and observations.  Electronically signed  by Deitra Mayo MD on 06/02/2020 at 2:27:27 PM.    Final    VAS [place] LOWER EXTREMITY BYPASS GRAFT DUPL  Result Date:  06/02/2020 LOWER EXTREMITY ARTERIAL DUPLEX STUDY Indications: Peripheral artery disease.  Vascular Interventions: 02/07/2013: Right femoral- below knee popliteal bypass                         graft. Current ABI:            Rt- 1.06, Lt- 0.57 Comparison Study: 04/16/2019: Patent right femoral-popliteal bypass graft.                   Stenosis noted in the inflow artery (50-74%) and proximal                   anastomosis (50-74%). Performing Technologist: Ivan Croft  Examination Guidelines: A complete evaluation includes B-mode imaging, spectral Doppler, color Doppler, and power Doppler as needed of all accessible portions of each vessel. Bilateral testing is considered an integral part of a complete examination. Limited examinations for reoccurring indications may be performed as noted.  Right Graft #1: Femoral-popliteal +------------------+--------+---------------+--------+-----------------+                   PSV cm/sStenosis       WaveformComments          +------------------+--------+---------------+--------+-----------------+ Inflow            334     50-74% stenosisbiphasicPlaque visualized +------------------+--------+---------------+--------+-----------------+ Prox Anastomosis  292     50-70% stenosisbiphasicPlaque visualized +------------------+--------+---------------+--------+-----------------+ Proximal Graft    98                     biphasic                  +------------------+--------+---------------+--------+-----------------+ Mid Graft         93                     biphasic                  +------------------+--------+---------------+--------+-----------------+ Distal Graft      53                     biphasic                  +------------------+--------+---------------+--------+-----------------+ Distal Anastomosis108                    biphasic                  +------------------+--------+---------------+--------+-----------------+ Outflow            130                    biphasic                  +------------------+--------+---------------+--------+-----------------+   Summary:  Right: Patent right femoral-below knee popliteal bypass graft. 50-74% stenosis in the inflow artery (common femoral artery) and proximal anastamosis.  See table(s) above for measurements and observations. Electronically signed by Deitra Mayo MD on 06/02/2020 at 2:27:41 PM.    Final     Lab Data:  CBC: Recent Labs  Lab 06/09/20 1348 06/10/20 0457 06/11/20 0611 06/11/20 1044  WBC 5.8 5.3 4.1 5.2  NEUTROABS  --   --  2.0  --   HGB 13.1 12.8* 11.4* 12.0*  HCT 39.1 38.5* 33.4* 35.5*  MCV 90.3 92.8 90.8 91.7  PLT 148* 147* 143* 735*   Basic Metabolic Panel: Recent Labs  Lab 06/07/20 1622 06/09/20 1348 06/10/20 0457 06/11/20 0611 06/11/20 1044  NA 134* 137 141 137 139  K 4.4 4.5 3.8 3.3* 3.8  CL 99 101 108 111 109  CO2 21 25 23 23  20*  GLUCOSE 356* 241* 137* 70 136*  BUN 37* 30* 22 17 14   CREATININE 2.69* 2.60* 1.95* 1.57* 1.65*  CALCIUM 9.3 9.8 9.2 8.5* 8.8*  MG  --   --   --  1.7 1.7  PHOS  --   --   --  2.6 2.3*   GFR: Estimated Creatinine Clearance: 31.7 mL/min (A) (by C-G formula based on SCr of 1.65 mg/dL (H)). Liver Function Tests: Recent Labs  Lab 06/09/20 1348 06/10/20 0457 06/11/20 0611 06/11/20 1044  AST 246* 166* 232* 277*  ALT 481* 340* 275* 291*  ALKPHOS 131* 109 100 109  BILITOT 0.4 0.7 0.4 0.6  PROT 6.8 6.0* 5.4* 5.7*  ALBUMIN 3.5 3.3* 2.9* 3.1*   Recent Labs  Lab 06/09/20 1348 06/10/20 0457 06/11/20 0611  LIPASE 175* 135* 149*   No results for input(s): AMMONIA in the last 168 hours. Coagulation Profile: No results for input(s): INR, PROTIME in the last 168 hours. Cardiac Enzymes: No results for input(s): CKTOTAL, CKMB, CKMBINDEX, TROPONINI in the last 168 hours. BNP (last 3 results) No results for input(s): PROBNP in the last 8760 hours. HbA1C: No results for input(s): HGBA1C in the last 72  hours. CBG: Recent Labs  Lab 06/10/20 1709 06/10/20 2159 06/11/20 0805 06/11/20 0848 06/11/20 1154  GLUCAP 184* 236* 55* 141* 216*   Lipid Profile: Recent Labs    06/10/20 0704  CHOL 77  HDL 26*  LDLCALC 19  TRIG 159*  CHOLHDL 3.0   Thyroid Function Tests: No results for input(s): TSH, T4TOTAL, FREET4, T3FREE, THYROIDAB in the last 72 hours. Anemia Panel: No results for input(s): VITAMINB12, FOLATE, FERRITIN, TIBC, IRON, RETICCTPCT in the last 72 hours. Urine analysis:    Component Value Date/Time   COLORURINE YELLOW 06/09/2020 2008   APPEARANCEUR CLEAR 06/09/2020 2008   LABSPEC 1.015 06/09/2020 2008   PHURINE 7.0 06/09/2020 2008   GLUCOSEU >=500 (A) 06/09/2020 2008   GLUCOSEU >=1000 (A) 02/07/2016 0914   HGBUR SMALL (A) 06/09/2020 2008   BILIRUBINUR NEGATIVE 06/09/2020 2008   KETONESUR NEGATIVE 06/09/2020 2008   PROTEINUR NEGATIVE 06/09/2020 2008   UROBILINOGEN 0.2 02/07/2016 0914   NITRITE NEGATIVE 06/09/2020 2008   LEUKOCYTESUR NEGATIVE 06/09/2020 2008     Alea Ryer M.D. [facility] 06/11/2020, 12:26 PM   Call night coverage person covering after 7pm

## 2020-06-12 LAB — LIPASE, BLOOD: Lipase: 119 U/L — ABNORMAL HIGH (ref 11–51)

## 2020-06-12 LAB — COMPREHENSIVE METABOLIC PANEL
ALT: 262 U/L — ABNORMAL HIGH (ref 0–44)
AST: 212 U/L — ABNORMAL HIGH (ref 15–41)
Albumin: 3.1 g/dL — ABNORMAL LOW (ref 3.5–5.0)
Alkaline Phosphatase: 109 U/L (ref 38–126)
Anion gap: 10 (ref 5–15)
BUN: 8 mg/dL (ref 8–23)
CO2: 22 mmol/L (ref 22–32)
Calcium: 9 mg/dL (ref 8.9–10.3)
Chloride: 107 mmol/L (ref 98–111)
Creatinine, Ser: 1.52 mg/dL — ABNORMAL HIGH (ref 0.61–1.24)
GFR, Estimated: 48 mL/min — ABNORMAL LOW (ref >60)
Glucose, Bld: 90 mg/dL (ref 70–99)
Potassium: 3.1 mmol/L — ABNORMAL LOW (ref 3.5–5.1)
Sodium: 139 mmol/L (ref 135–145)
Total Bilirubin: 0.4 mg/dL (ref 0.3–1.2)
Total Protein: 5.6 g/dL — ABNORMAL LOW (ref 6.5–8.1)

## 2020-06-12 LAB — GLUCOSE, CAPILLARY
Glucose-Capillary: 79 mg/dL (ref 70–99)
Glucose-Capillary: 238 mg/dL — ABNORMAL HIGH (ref 70–99)

## 2020-06-12 MED ORDER — ONDANSETRON 4 MG PO TBDP: 4.0000 mg | ORAL_TABLET | Freq: Three times a day (TID) | ORAL | 0 refills | Status: DC | PRN

## 2020-06-12 MED ORDER — INSULIN GLARGINE 100 UNIT/ML ~~LOC~~ SOLN
12.0000 [IU] | Freq: Every day | SUBCUTANEOUS | Status: DC
  Administered 2020-06-12: 12 [IU] via SUBCUTANEOUS
  Filled 2020-06-12: qty 0.12

## 2020-06-12 MED ORDER — SUCRALFATE 1 G PO TABS: 1.0000 g | ORAL_TABLET | Freq: Three times a day (TID) | ORAL | 0 refills | Status: DC

## 2020-06-12 MED ORDER — LOSARTAN POTASSIUM 100 MG PO TABS: 100.0000 mg | ORAL_TABLET | Freq: Every day | ORAL | 2 refills | Status: DC

## 2020-06-12 MED ORDER — METFORMIN HCL 1000 MG PO TABS
1000.0000 mg | ORAL_TABLET | Freq: Two times a day (BID) | ORAL | 0 refills | Status: DC
Start: 2020-06-12 — End: 2020-06-22

## 2020-06-12 MED ORDER — POTASSIUM CHLORIDE CRYS ER 20 MEQ PO TBCR
40.0000 meq | EXTENDED_RELEASE_TABLET | Freq: Once | ORAL | Status: AC
  Administered 2020-06-12: 40 meq via ORAL
  Filled 2020-06-12: qty 2

## 2020-06-12 MED ORDER — FAMOTIDINE 20 MG PO TABS: 20.0000 mg | ORAL_TABLET | Freq: Two times a day (BID) | ORAL | 1 refills | Status: DC

## 2020-06-12 NOTE — Progress Notes (Signed)
Assessment unchanged. Dc instructions given to wife and pt with verbalized understanding of Medications to resume, follow up care, and to take CBGs before meals and at bedtime per Dr Tana Coast. Understood to record blood sugars and take record to PCP. Discharged via wc to front entrance accompanied by wife and NT.

## 2020-06-12 NOTE — TOC Transition Note (Signed)
Transition of Care [facility]) - CM/SW Discharge Note   Patient Details  Name: Steven [name] MRN: [id number] Date of Birth: 1945/10/22  Transition of Care [facility]) CM/SW Contact:  Trish Mage, LCSW Phone Number: 06/12/2020, 10:34 AM   Clinical Narrative:   Patient seen in follow up to MD consult for alcohol abuse.  I found [name] to be alert, oriented, eager for d/c.  He initially denied alcohol was related to his admission, then stated he was "told" it is related, and finally admitted to having previous experience with liquor and beer, from which he has abstained, now drinks wine only.  "I guess the wine has to go as well.  I want to be healthy. My job is demanding."  Denied needing any resources to help with alcohol cessation.  [name] wife was on the phone at the same time.  She was asking for resources, but none related to his drinking.  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 Let her know I have no job resources, and that patient would have appointments or at least contacts for all needed medical follow ups.  No further needs identified. TOC sign off.    Final next level of care: Home/Self Care Barriers to Discharge: No Barriers Identified   Patient Goals and CMS Choice        Discharge Placement                       Discharge Plan and Services                                     Social Determinants of Health (SDOH) Interventions     Readmission Risk Interventions No flowsheet data found.

## 2020-06-12 NOTE — Discharge Summary (Signed)
Physician Discharge Summary   Patient ID: Steven [name] MRN: [id number] DOB/AGE: 72-Feb-1947 72 y.o.  Admit date: 06/09/2020 Discharge date: 06/12/2020  Primary Care Physician:  Colon Branch, MD   Recommendations for Outpatient Follow-up:  1. Follow up with PCP in 1-2 weeks 2. Please obtain LFTs in 1 week for follow-up 3. Ambulatory referral to endocrinology sent for uncontrolled diabetes mellitus  4. Hold statin due to transaminitis 5. Patient was given instruction to hold Metformin until follow-up with PCP.  However he may resume if the blood sugars are running high in 200s to 300s 6. 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 AKI (acute kidney injury) ([place]) on CKD stage IIIa Diabetes mellitus, type II, IDDM, with hyper and hypoglycemia . Benign essential hypertension . CAD (coronary artery disease) . Chronic obstructive pulmonary disease ([place]) . Hyperlipidemia with target LDL less than 70 . GERD (gastroesophageal reflux disease)   Consults:  GI     Allergies:  No Known Allergies   DISCHARGE MEDICATIONS: Allergies as of 06/12/2020   No Known Allergies     Medication List    STOP taking these medications   ezetimibe 10 MG tablet Commonly known as: Zetia   rosuvastatin 40 MG tablet Commonly known as: CRESTOR     TAKE these medications   amLODipine 10 MG tablet Commonly known as: NORVASC Take 1 tablet (10 mg total) by mouth daily.   aspirin EC 81 MG tablet Take 81 mg by mouth daily.   blood glucose meter kit and supplies Dispense based on patient and insurance preference. Check blood sugars twice daily. Dx: E11.9   cetirizine 10 MG tablet Commonly known as: ZYRTEC Take 10 mg by mouth daily.    empagliflozin 10 MG Tabs tablet Commonly known as: Jardiance Take 1 tablet (10 mg total) by mouth daily before breakfast. What changed: when to take this   famotidine 20 MG tablet Commonly known as: Pepcid Take 1 tablet (20 mg total) by mouth 2 (two) times daily.   fluticasone 50 MCG/ACT nasal spray Commonly known as: FLONASE Place 2 sprays into both nostrils daily.   glucose blood test strip Commonly known as: OneTouch Verio Use to check blood sugar 2 times per day   Insulin Pen Needle 32G X 4 MM Misc Commonly known as: BD Pen Needle Nano U/F use as directed with INSULIN   losartan 100 MG tablet Commonly known as: COZAAR Take 1 tablet (100 mg total) by mouth daily. Start taking on: June 14, 2020 What changed: These instructions start on June 14, 2020. If you are unsure what to do until then, ask your doctor or other care provider.   metFORMIN 1000 MG tablet Commonly known as: GLUCOPHAGE Take 1 tablet (1,000 mg total) by mouth 2 (two) times daily with a meal. HOLD UNTIL FOLLOW UP WITH DR. PAZ What changed: additional instructions   metoprolol succinate 100 MG 24 hr tablet Commonly known as: TOPROL-XL Take 1.5 tablets (150 mg total) by mouth daily.   mometasone 50 MCG/ACT nasal spray Commonly known as: NASONEX PLACE 2 SPRAYS INTO THE NOSE DAILY   ondansetron 4 MG disintegrating tablet Commonly known as: Zofran ODT Take 1 tablet (4 mg total) by mouth every 8 (eight) hours as needed for nausea or vomiting.  OneTouch Delica Lancets 28U Misc Use to check blood sugar 2 times per day.   OneTouch Verio IQ System w/Device Kit Use to check blood sugar 2 times per day.   sucralfate 1 g tablet Commonly known as: CARAFATE Take 1 tablet (1 g total) by mouth 4 (four) times daily -  with meals and at bedtime.   tamsulosin 0.4 MG Caps capsule Commonly known as: FLOMAX TAKE 1 CAPSULE(0.4 MG) BY MOUTH DAILY What changed: See the new instructions.   Toujeo SoloStar  300 UNIT/ML Solostar Pen Generic drug: insulin glargine (1 Unit Dial) Inject 15 Units into the skin daily.        Brief H and P: For complete details please refer to admission H and P, but in brief 74 year old gentleman with extensive medical issues including type 2 diabetes on insulin, hypertension, coronary artery disease, hyper lipidemia, COPD, atrial fibrillation and GERD who came to the ER with sudden onset of abdominal pain, nausea and vomiting. Also had bilateral upper quadrant abdominal pain. No recent fever. No recent exposure. He has CKD stage IIIaand has outpatient follow-up. In the emergency room hemodynamically stable. He was found to have creatinine of 2.6, baseline creatinine about 1.5. Lipase 175. Total bilirubin normal. Transaminases elevated to 400s. Patient was admitted for acute pancreatitis with transaminitis  Hospital Course:  Acute abdominal pain with nausea and vomiting likely due to acute gastroenteritis, possible mild acute pancreatitis due to alcohol use -CT abdomen pelvis showed no acute findings, no gallstones.  Abdominal ultrasound normal, portal vein patent, liver Doppler normal. -Lipase 175 at the time of admission AST 246, ALT 481, total bilirubin 0.4 -Initially placed on n.p.o. status, IV fluid hydration, lipase has improved to 119, LFTs are improving.  Statin held -GI was consulted, recommended conservative management, outpatient follow-up with GI, obtain LFTs for the trend.  If still elevated, may need further work-up. Patient is now tolerating solid diet without any difficulty.  Elevated transaminitis -Unclear etiology, ALT 481 at the time of admission -> 291, AST 246> 166, alkaline phosphatase 131 on admission-> 109, improving -Hepatitis panel negative, T bi within normal limits Continue to hold statin  Acute kidney injury, likely has CKD stage IIIa -Creatinine 2.6 at the time of admission, patient was placed on IV fluid hydration Creatinine  improved to 1.5 at the time of discharge Metformin, losartan were held   Diabetes mellitus type 2, IDDM, -Patient was placed on Lantus and sliding scale insulin while inpatient.  Jardiance and Metformin were held Hemoglobin A1c 7.0 on 06/07/2020 Patient was noted to have hypo and hyperglycemia, continue Jardiance and glargine. Patient was recommended to hold Metformin until follow-up with his PCP with instruction to resume if blood sugars are running high in 200s to 300s.  Ambulatory referral to endocrinology sent.    CAD (coronary artery disease) -Currently no evidence of acute coronary syndrome, continue nebs  GERD Continue PPI  History of alcohol use  counseled on alcohol cessation Patient was placed on Ativan with CIWA protocol, did not go into any acute alcohol withdrawals  Plan was discussed with patient and wife on the phone, feel stable to go home.  Day of Discharge S: No acute complaints, feeling better, looking forward to go home.  BP (!) 146/62   Pulse 60   Temp 98.1 F (36.7 C) (Oral)   Resp 18   Ht 5' 5" (1.651 m)   Wt 55.4 kg   SpO2 100%   BMI 20.32 kg/m   Physical Exam: General:  Alert and awake oriented x3 not in any acute distress. HEENT: anicteric sclera, pupils reactive to light and accommodation CVS: S1-S2 clear no murmur rubs or gallops Chest: clear to auscultation bilaterally, no wheezing rales or rhonchi Abdomen: soft nontender, nondistended, normal bowel sounds Extremities: no cyanosis, clubbing or edema noted bilaterally Neuro: Cranial nerves II-XII intact, no focal neurological deficits    Get Medicines reviewed and adjusted: Please take all your medications with you for your next visit with your Primary MD  Please request your Primary MD to go over all hospital tests and procedure/radiological results at the follow up. Please ask your Primary MD to get all Hospital records sent to his/her office.  If you experience worsening of your  admission symptoms, develop shortness of breath, life threatening emergency, suicidal or homicidal thoughts you must seek medical attention immediately by calling 911 or calling your MD immediately  if symptoms less severe.  You must read complete instructions/literature along with all the possible adverse reactions/side effects for all the Medicines you take and that have been prescribed to you. Take any new Medicines after you have completely understood and accept all the possible adverse reactions/side effects.   Do not drive when taking pain medications.   Do not take more than prescribed Pain, Sleep and Anxiety Medications  Special Instructions: If you have smoked or chewed Tobacco  in the last 2 yrs please stop smoking, stop any regular Alcohol  and or any Recreational drug use.  Wear Seat belts while driving.  Please note  You were cared for by a hospitalist during your hospital stay. Once you are discharged, your primary care physician will handle any further medical issues. Please note that NO REFILLS for any discharge medications will be authorized once you are discharged, as it is imperative that you return to your primary care physician (or establish a relationship with a primary care physician if you do not have one) for your aftercare needs so that they can reassess your need for medications and monitor your lab values.   The results of significant diagnostics from this hospitalization (including imaging, microbiology, ancillary and laboratory) are listed below for reference.      Procedures/Studies:  CT ABDOMEN PELVIS WO CONTRAST  Result Date: 06/09/2020 CLINICAL DATA:  Nausea and vomiting beginning 5 days ago. Abdominal pain. EXAM: CT ABDOMEN AND PELVIS WITHOUT CONTRAST TECHNIQUE: Multidetector CT imaging of the abdomen and pelvis was performed following the standard protocol without IV contrast. COMPARISON:  06/14/2019 FINDINGS: Lower chest: Chronic scarring at the lung  bases, right more than left. No active process identified. Hepatobiliary: Normal appearance of the liver without contrast. No calcified gallstones. Few scattered punctate arterial calcifications. Pancreas: Normal Spleen: Normal Adrenals/Urinary Tract: Adrenal glands are normal. Kidneys are normal. Extensive arterial calcification. Bladder is normal. Stomach/Bowel: Stomach and small intestine appear normal. Normal appendix. Moderate amount of fecal matter in the colon, within normal limits. Evidence of diverticulitis or obstruction Vascular/Lymphatic: Extensive aortic atherosclerosis and branch vessel atherosclerosis. IVC is normal. No adenopathy. Reproductive: Normal Other: No free fluid or air. Musculoskeletal: Negative IMPRESSION: 1. No acute finding to explain the clinical presentation. No evidence of bowel obstruction or other acute bowel pathology. 2. Extensive aortic atherosclerosis and branch vessel atherosclerosis. 3. Chronic scarring at the lung bases, right more than left. Aortic Atherosclerosis (ICD10-I70.0). Electronically Signed   By: Nelson Chimes M.D.   On: 06/09/2020 15:12   US Abdomen Limited  Addendum Date: 06/11/2020   ADDENDUM REPORT: 06/11/2020 15:02 ADDENDUM: Relative  to the original report there is a typographical error. At the request of the referring physician this addendum is made for clarification. Instead of 14 mm the common bile duct measures 1.4 mm maximal caliber. Electronically Signed   By: Zetta Bills M.D.   On: 06/11/2020 15:02   Result Date: 06/11/2020 CLINICAL DATA:  Increased LFTs EXAM: ULTRASOUND ABDOMEN LIMITED RIGHT UPPER QUADRANT COMPARISON:  None. FINDINGS: Gallbladder: No gallstones or wall thickening visualized. No sonographic Murphy sign noted by sonographer. Common bile duct: Diameter: 14 mm Liver: No focal lesion identified. Within normal limits in parenchymal echogenicity. Portal vein is patent on color Doppler imaging with normal direction of blood flow  towards the liver. Other: None. IMPRESSION: Normal right upper quadrant ultrasound Electronically Signed: By: Prudencio Pair M.D. On: 06/09/2020 17:52   VAS [place] ABI WITH/WO TBI  Result Date: 06/02/2020 LOWER EXTREMITY DOPPLER STUDY Indications: Peripheral artery disease. High Risk Factors: Hypertension, hyperlipidemia, Diabetes.  Vascular Interventions: 02/07/2013: Right femoral- below knee popliteal bypass                         graft. Comparison Study: 04/16/2019: Rt- ABI 1.23, TBI 0.53; Lt- ABI 0.70, TBI 0.47. Performing Technologist: Ivan Croft  Examination Guidelines: A complete evaluation includes at minimum, Doppler waveform signals and systolic blood pressure reading at the level of bilateral brachial, anterior tibial, and posterior tibial arteries, when vessel segments are accessible. Bilateral testing is considered an integral part of a complete examination. Photoelectric Plethysmograph (PPG) waveforms and toe systolic pressure readings are included as required and additional duplex testing as needed. Limited examinations for reoccurring indications may be performed as noted.  ABI Findings: +---------+------------------+-----+---------+--------+ Right    Rt Pressure (mmHg)IndexWaveform Comment  +---------+------------------+-----+---------+--------+ Brachial 132                                      +---------+------------------+-----+---------+--------+ PTA      140               1.06 triphasic         +---------+------------------+-----+---------+--------+ DP       132               1.00 triphasic         +---------+------------------+-----+---------+--------+ Great Toe51                0.39                   +---------+------------------+-----+---------+--------+ +---------+------------------+-----+----------+-------+ Left     Lt Pressure (mmHg)IndexWaveform  Comment +---------+------------------+-----+----------+-------+ Brachial 131                                       +---------+------------------+-----+----------+-------+ PTA      64                0.48 monophasic        +---------+------------------+-----+----------+-------+ DP       75                0.57 monophasic        +---------+------------------+-----+----------+-------+ Great Toe0                 0.00 Absent            +---------+------------------+-----+----------+-------+ +-------+-----------+-----------+------------+------------+ ABI/TBIToday's ABIToday's TBIPrevious ABIPrevious TBI +-------+-----------+-----------+------------+------------+  Right  1.06       0.39       1.23        0.53         +-------+-----------+-----------+------------+------------+ Left   0.57       0          0.70        0.47         +-------+-----------+-----------+------------+------------+   Summary: Right: Resting right ankle-brachial index is within normal range. No evidence of significant right lower extremity arterial disease. The right toe-brachial index is abnormal. Left: Resting left ankle-brachial index indicates moderate left lower extremity arterial disease.  *See table(s) above for measurements and observations.  Electronically signed by Deitra Mayo MD on 06/02/2020 at 2:27:27 PM.    Final    VAS [place] LOWER EXTREMITY BYPASS GRAFT DUPL  Result Date: 06/02/2020 LOWER EXTREMITY ARTERIAL DUPLEX STUDY Indications: Peripheral artery disease.  Vascular Interventions: 02/07/2013: Right femoral- below knee popliteal bypass                         graft. Current ABI:            Rt- 1.06, Lt- 0.57 Comparison Study: 04/16/2019: Patent right femoral-popliteal bypass graft.                   Stenosis noted in the inflow artery (50-74%) and proximal                   anastomosis (50-74%). Performing Technologist: Ivan Croft  Examination Guidelines: A complete evaluation includes B-mode imaging, spectral Doppler, color Doppler, and power Doppler as needed of all accessible portions of  each vessel. Bilateral testing is considered an integral part of a complete examination. Limited examinations for reoccurring indications may be performed as noted.  Right Graft #1: Femoral-popliteal +------------------+--------+---------------+--------+-----------------+                   PSV cm/sStenosis       WaveformComments          +------------------+--------+---------------+--------+-----------------+ Inflow            334     50-74% stenosisbiphasicPlaque visualized +------------------+--------+---------------+--------+-----------------+ Prox Anastomosis  292     50-70% stenosisbiphasicPlaque visualized +------------------+--------+---------------+--------+-----------------+ Proximal Graft    98                     biphasic                  +------------------+--------+---------------+--------+-----------------+ Mid Graft         93                     biphasic                  +------------------+--------+---------------+--------+-----------------+ Distal Graft      53                     biphasic                  +------------------+--------+---------------+--------+-----------------+ Distal Anastomosis108                    biphasic                  +------------------+--------+---------------+--------+-----------------+ Outflow           130  biphasic                  +------------------+--------+---------------+--------+-----------------+   Summary:  Right: Patent right femoral-below knee popliteal bypass graft. 50-74% stenosis in the inflow artery (common femoral artery) and proximal anastamosis.  See table(s) above for measurements and observations. Electronically signed by Deitra Mayo MD on 06/02/2020 at 2:27:41 PM.    Final       LAB RESULTS: Basic Metabolic Panel: Recent Labs  Lab 06/11/20 1044 06/12/20 0557  NA 139 139  K 3.8 3.1*  CL 109 107  CO2 20* 22  GLUCOSE 136* 90  BUN 14 8  CREATININE 1.65* 1.52*   CALCIUM 8.8* 9.0  MG 1.7  --   PHOS 2.3*  --    Liver Function Tests: Recent Labs  Lab 06/11/20 1044 06/12/20 0557  AST 277* 212*  ALT 291* 262*  ALKPHOS 109 109  BILITOT 0.6 0.4  PROT 5.7* 5.6*  ALBUMIN 3.1* 3.1*   Recent Labs  Lab 06/11/20 0611 06/12/20 0557  LIPASE 149* 119*   No results for input(s): AMMONIA in the last 168 hours. CBC: Recent Labs  Lab 06/11/20 0611 06/11/20 0611 06/11/20 1044  WBC 4.1  --  5.2  NEUTROABS 2.0  --   --   HGB 11.4*  --  12.0*  HCT 33.4*  --  35.5*  MCV 90.8   < > 91.7  PLT 143*  --  142*   < > = values in this interval not displayed.   Cardiac Enzymes: No results for input(s): CKTOTAL, CKMB, CKMBINDEX, TROPONINI in the last 168 hours. BNP: Invalid input(s): POCBNP CBG: Recent Labs  Lab 06/12/20 0751 06/12/20 1203  GLUCAP 79 238*       Disposition and Follow-up: Discharge Instructions    Ambulatory referral to Endocrinology   Complete by: As directed    Uncontrolled dm   Diet Carb Modified   Complete by: As directed    Discharge instructions   Complete by: As directed    It is VERY IMPORTANT that you follow up with a PCP on a regular basis.  Check your blood glucoses before each meal and at bedtime and maintain a log of your readings.  Bring this log with you when you follow up with your PCP so that he or she can adjust your insulin and oral medications at your follow up visit.  You may resume Metformin if your blood sugars are running high 200s -300s at home.  Follow-up with Dr. Larose Kells in 1 week.   Increase activity slowly   Complete by: As directed    Increase activity slowly   Complete by: As directed        DISPOSITION: Home   DISCHARGE FOLLOW-UP  Follow-up Information    Zehr, Laban Emperor, PA-C Follow up on 07/01/2020.   Specialty: Gastroenterology Why: 10 AM Contact information: [place] [address] [phone]        Philemon Kingdom, MD. Schedule an appointment as soon as  possible for a visit in 2 week(s).   Specialty: Internal Medicine Why: Please call on Tuesday if the office has received the referral and for appointment for diabetes  Contact information: [address] [place] [address] [phone]        Colon Branch, MD Follow up in 1 week(s).   Specialty: Internal Medicine Contact information: [address] [place] [address] [phone]  Time coordinating discharge:  35 minutes  Signed:   Estill Cotta M.D. [facility] 06/12/2020, 12:24 PM

## 2020-06-14 ENCOUNTER — Telehealth: Payer: Self-pay

## 2020-06-14 NOTE — Telephone Encounter (Signed)
1st attempte TCM call.  No answer

## 2020-06-15 ENCOUNTER — Telehealth: Payer: Self-pay

## 2020-06-15 NOTE — Telephone Encounter (Signed)
Transition Care Management Follow-up Telephone Call  Date of discharge and from where: 06/12/20-[place]  How have you been since you were released from the hospital? Much better  Any questions or concerns? No  Items Reviewed:  Did the pt receive and understand the discharge instructions provided? No Patient received instructions but  he did not know he was suppose to hold his Metformin and Statins. Went over all discharge instructions with patient. Patient voiced understanding  Medications obtained and verified? Yes   Other? Yes   Any new allergies since your discharge? No   Dietary orders reviewed? Yes  Do you have support at home? Yes   Home Care and Equipment/Supplies: Were home health services ordered? no If so, what is the name of the agency? n/a  Has the agency set up a time to come to the patient's home? not applicable Were any new equipment or medical supplies ordered?  No What is the name of the medical supply agency? n/a Were you able to get the supplies/equipment? not applicable Do you have any questions related to the use of the equipment or supplies? No  Functional Questionnaire: (I = Independent and D = Dependent) ADLs: I  Bathing/Dressing- I  Meal Prep- I  Eating- I  Maintaining continence- I  Transferring/Ambulation- I  Managing Meds- I  Follow up appointments reviewed:   PCP Hospital f/u appt confirmed? Yes  Scheduled to see Dr. Larose Kells on 06/22/20 @ 11:20.  [facility] f/u appt confirmed? Yes  Scheduled to see Dr. Myrtice Lauth on 07/01/20 @ 10:00.  Are transportation arrangements needed? No   If their condition worsens, is the pt aware to call PCP or go to the Emergency Dept.? Yes  Was the patient provided with contact information for the PCP's office or ED? Yes  Was to pt encouraged to call back with questions or concerns? Yes

## 2020-06-22 ENCOUNTER — Ambulatory Visit: Payer: Medicare Other | Admitting: Internal Medicine

## 2020-06-22 ENCOUNTER — Ambulatory Visit: Payer: Medicare Other | Attending: Internal Medicine

## 2020-06-22 ENCOUNTER — Encounter: Payer: Self-pay | Admitting: Internal Medicine

## 2020-06-22 ENCOUNTER — Other Ambulatory Visit (HOSPITAL_BASED_OUTPATIENT_CLINIC_OR_DEPARTMENT_OTHER): Payer: Self-pay | Admitting: Internal Medicine

## 2020-06-22 ENCOUNTER — Other Ambulatory Visit: Payer: Self-pay

## 2020-06-22 VITALS — BP 130/72 | HR 57 | Temp 97.7°F | Ht 65.0 in | Wt 127.8 lb

## 2020-06-22 DIAGNOSIS — R194 Change in bowel habit: Secondary | ICD-10-CM

## 2020-06-22 DIAGNOSIS — K859 Acute pancreatitis without necrosis or infection, unspecified: Secondary | ICD-10-CM | POA: Diagnosis not present

## 2020-06-22 DIAGNOSIS — E1159 Type 2 diabetes mellitus with other circulatory complications: Secondary | ICD-10-CM

## 2020-06-22 DIAGNOSIS — R634 Abnormal weight loss: Secondary | ICD-10-CM

## 2020-06-22 DIAGNOSIS — Z794 Long term (current) use of insulin: Secondary | ICD-10-CM

## 2020-06-22 DIAGNOSIS — Z23 Encounter for immunization: Secondary | ICD-10-CM

## 2020-06-22 LAB — LIPID PANEL
Cholesterol: 173 mg/dL (ref 0–200)
HDL: 57.6 mg/dL (ref >39.00)
LDL Cholesterol: 94 mg/dL (ref 0–99)
NonHDL: 115.89
Total CHOL/HDL Ratio: 3
Triglycerides: 111 mg/dL (ref 0.0–149.0)
VLDL: 22.2 mg/dL (ref 0.0–40.0)

## 2020-06-22 LAB — COMPREHENSIVE METABOLIC PANEL
ALT: 46 U/L (ref 0–53)
AST: 33 U/L (ref 0–37)
Albumin: 4.1 g/dL (ref 3.5–5.2)
Alkaline Phosphatase: 93 U/L (ref 39–117)
BUN: 21 mg/dL (ref 6–23)
CO2: 31 mEq/L (ref 19–32)
Calcium: 10.1 mg/dL (ref 8.4–10.5)
Chloride: 105 mEq/L (ref 96–112)
Creatinine, Ser: 1.73 mg/dL — ABNORMAL HIGH (ref 0.40–1.50)
GFR: 39.03 mL/min — ABNORMAL LOW (ref >60.00)
Glucose, Bld: 140 mg/dL — ABNORMAL HIGH (ref 70–99)
Potassium: 4.2 mEq/L (ref 3.5–5.1)
Sodium: 143 mEq/L (ref 135–145)
Total Bilirubin: 0.4 mg/dL (ref 0.2–1.2)
Total Protein: 6.7 g/dL (ref 6.0–8.3)

## 2020-06-22 LAB — LIPASE: Lipase: 279 U/L — ABNORMAL HIGH (ref 11.0–59.0)

## 2020-06-22 LAB — AMYLASE: Amylase: 222 U/L — ABNORMAL HIGH (ref 27–131)

## 2020-06-22 MED FILL — PFIZER-BIONTECH COVID-19 VA: 30 | 1 days supply | Qty: 0 | Fill #0

## 2020-06-22 NOTE — Patient Instructions (Addendum)
Check the  blood pressure regularly. BP GOAL is between 110/65 and  135/85. If it is consistently higher or lower, let me know  Diabetes: Check your blood sugar   at different times of the day  GOALS: Fasting before a meal 100- 130 2 hours after a meal less than 180 We are referring to the endocrinologist at this location  We are referring you to our gastroenterologist   GO TO THE LAB : Get the blood work     [place], [place] back for a checkup in 3 weeks

## 2020-06-22 NOTE — Progress Notes (Signed)
° °  Covid-19 Vaccination Clinic  Name:  Steven [name]    MRN: [id number] DOB: Oct 22, 1945  06/22/2020  [name] was observed post Covid-19 immunization for 15 minutes without incident. He was provided with Vaccine Information Sheet and instruction to access the V-Safe system.   [name] was instructed to call 911 with any severe reactions post vaccine:  Difficulty breathing   Swelling of face and throat   A fast heartbeat   A bad rash all over body   Dizziness and weakness   Immunizations Administered    Name Date Dose VIS Date Route   Pfizer COVID-19 Vaccine 06/22/2020 10:54 AM 0.3 mL 05/12/2020 Intramuscular   Manufacturer: [place]   Lot: [id number]   [place]: [id number]

## 2020-06-22 NOTE — Progress Notes (Addendum)
Subjective:    Patient ID: Steven [name], male    DOB: February 02, 1946, 74 y.o.   MRN: [id number]  DOS:  06/22/2020 Type of visit - description: Hospital follow-up, here with his wife  Patient was seen here 06/07/2020, at the time he reported weight loss, change in bowel habits, decreased appetite, labs that day showed his creatinine increased from 1.48-2.6.  The next day however he presented to the hospital with sudden onset of abdominal pain, nausea and vomiting. Lipase was 175, elevated.  LFTs elevated as well (in the 200-400). CT abdomen nonacute. Abdominal ultrasound with normal   He was provided supportive treatment and statins were held. LFTs improved gradually.   Creatinine decreased to 1.5 prior to d/c. For diabetes was referred to Endo. EtOH seems to be the trigger of some of his problems.  Wt Readings from Last 3 Encounters:  06/22/20 127 lb 12.8 oz (58 kg)  06/09/20 122 lb 2.2 oz (55.4 kg)  06/07/20 122 lb 8 oz (55.6 kg)    Review of Systems Since he left the hospital he is doing some better. Appetite continues to be decreased. Abdominal pain has decreased Denies nausea vomiting or blood in the stools. Reports some burning at the esophagus which he swallows but the food is not getting stuck. We review his ambulatory CBGs and BPs.   Past Medical History:  Diagnosis Date  . Allergy   . Arthritis    Right Hip  . Atrial fibrillation ([place])   . BPH (benign prostatic hyperplasia)   . COPD (chronic obstructive pulmonary disease) ([place])   . Coronary artery disease    Lexiscan Myoview 7/14:  Inf scar with minimal reversibility, inf HK, EF 45%;  Low risk.  Promus LAD stent placed in [place].  100% RCA  . Diabetes 1.5, managed as type 1 ([place])   . Diverticulosis    on CT  . Dyslipidemia   . GERD (gastroesophageal reflux disease)    has used Zantac in the past   . Glaucoma   . Hammer toes, bilateral   . Hernia   . Hyperlipidemia   . Hypertension   . Peripheral  vascular disease ([place])    a. s/p R fem-pop BPG 01/2013 (Dr. [name])  . 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 ABDOMINAL AORTAGRAM N/A 12/10/2012   Procedure: ABDOMINAL Maxcine Ham;  Surgeon: Serafina Mitchell, MD;  Location: [facility] CATH LAB;  Service: Cardiovascular;  Laterality: N/A;  . CARDIAC CATHETERIZATION     with stent placement  . FEMORAL-POPLITEAL BYPASS GRAFT Right 02/07/2013   Procedure: BYPASS GRAFT FEMORAL-POPLITEAL ARTERY- RIGHT;  Surgeon: Mal Misty, MD;  Location: [facility] OR;  Service: Vascular;  Laterality: Right;  Right femoral to below knee popliteal bypass using right non reversed great saphenous vein  . INGUINAL HERNIA REPAIR  2010   Right  . INGUINAL HERNIA REPAIR  09/11/2018  . INTRAOPERATIVE ARTERIOGRAM Right 02/07/2013   Procedure: Attempted INTRA OPERATIVE ARTERIOGRAM;  Surgeon: Mal Misty, MD;  Location: [place];  Service: Vascular;  Laterality: Right;  . IR RETROGRADE PYELOGRAM  12/10/2019  . TRANSURETHRAL RESECTION OF PROSTATE  12/10/2019    Allergies as of 06/22/2020   No Known Allergies     Medication List       Accurate as of June 22, 2020 11:59 PM. If you have any questions, ask your nurse or doctor.  STOP taking these medications   losartan 100 MG tablet Commonly known as: COZAAR Stopped by: Kathlene November, MD   metFORMIN 1000 MG tablet Commonly known as: GLUCOPHAGE Stopped by: Kathlene November, MD     TAKE these medications   amLODipine 10 MG tablet Commonly known as: NORVASC Take 1 tablet (10 mg total) by mouth daily.   aspirin EC 81 MG tablet Take 81 mg by mouth daily.   blood glucose meter kit and supplies Dispense based on patient and insurance preference. Check blood sugars twice daily. Dx: E11.9   cetirizine 10 MG tablet Commonly known as: ZYRTEC Take 10 mg by mouth daily.   empagliflozin 10 MG Tabs tablet Commonly known as:  Jardiance Take 1 tablet (10 mg total) by mouth daily before breakfast. What changed: when to take this   famotidine 20 MG tablet Commonly known as: Pepcid Take 1 tablet (20 mg total) by mouth 2 (two) times daily.   fluticasone 50 MCG/ACT nasal spray Commonly known as: FLONASE Place 2 sprays into both nostrils daily.   glucose blood test strip Commonly known as: OneTouch Verio Use to check blood sugar 2 times per day   Insulin Pen Needle 32G X 4 MM Misc Commonly known as: BD Pen Needle Nano U/F use as directed with INSULIN   metoprolol succinate 100 MG 24 hr tablet Commonly known as: TOPROL-XL Take 1.5 tablets (150 mg total) by mouth daily.   mometasone 50 MCG/ACT nasal spray Commonly known as: NASONEX PLACE 2 SPRAYS INTO THE NOSE DAILY   ondansetron 4 MG disintegrating tablet Commonly known as: Zofran ODT Take 1 tablet (4 mg total) by mouth every 8 (eight) hours as needed for nausea or vomiting.   OneTouch Delica Lancets 75Z Misc Use to check blood sugar 2 times per day.   OneTouch Verio IQ System w/Device Kit Use to check blood sugar 2 times per day.   sucralfate 1 g tablet Commonly known as: CARAFATE Take 1 tablet (1 g total) by mouth 4 (four) times daily -  with meals and at bedtime.   tamsulosin 0.4 MG Caps capsule Commonly known as: FLOMAX TAKE 1 CAPSULE(0.4 MG) BY MOUTH DAILY What changed: See the new instructions.   Toujeo SoloStar 300 UNIT/ML Solostar Pen Generic drug: insulin glargine (1 Unit Dial) Inject 15 Units into the skin daily.          Objective:   Physical Exam BP 130/72 (BP Location: Left Arm, Patient Position: Sitting, Cuff Size: Large)   Pulse (!) 57   Temp 97.7 F (36.5 C) (Oral)   Ht '5\' 5"'  (1.651 m)   Wt 127 lb 12.8 oz (58 kg)   SpO2 100%   BMI 21.27 kg/m  General:   Well developed, NAD, BMI noted.  HEENT:  Normocephalic . Face symmetric, atraumatic Lungs:  CTA B Normal respiratory effort, no intercostal retractions, no  accessory muscle use. Heart: RRR,  no murmur.  Abdomen:  Not distended, soft, non-tender. No rebound or rigidity.   Skin: Not pale. Not jaundice Lower extremities: no pretibial edema bilaterally  Neurologic:  alert & oriented X3.  Speech normal, gait appropriate for age and unassisted Psych--  Cognition and judgment appear intact.  Cooperative with normal attention span and concentration.  Behavior appropriate. No anxious or depressed appearing.     Assessment      Assessment (new pt to me 12/2019) DM HTN Hyperlipidemia CRI: Creatinine fluctuates 1.70- 1.40 CV: --CAD, Dr. Percival Spanish --PAD  Sickle cell anemia COPD BPH,  TURP and bilateral retrograde pyelograms 12/10/2019   PLAN Recently admitted with the following issues: Pancreatitis, transaminitis (EtOH?),  AKI: Presented with abdominal pain, found to have pancreatitis and transaminitis as well as acute kidney injury. Was treated conservatively. Hepatitis serology negative CT and abdominal /ultrasound nonacute.  CT did show extensive atherosclerosis. EtOH was felt to be the culprit, he drinks almost a bottle of wine every night. The only new medication he takes is Jardiance (not known to cause pancreatitis or hepatitis) Plan: CMP, CBC, amylase, lipase. EtOH strongly recommend moderation or full abstinence. GI referral for above issues and also weight loss and change in bowel habits. DM: Due to recent AKI will stop Metformin although it could be restarted at some point. Ambulatory CBGs AM: 96, 104, 111. CBGs in the afternoon: 218, 237, 233. Current meds:, Insulin 15 units daily,Jardiance Refer to Endo here at this location. HTN: Currently on amlodipine, metoprolol, losartan DC'd due to AKI.  Ambulatory BPs range from 145-152. Checking labs today, consider restart losartan. Follow-up 3 weeks  This visit occurred during the SARS-CoV-2 public health emergency.  Safety protocols were in place, including screening  questions prior to the visit, additional usage of staff PPE, and extensive cleaning of exam room while observing appropriate contact time as indicated for disinfecting solutions.

## 2020-06-23 NOTE — Assessment & Plan Note (Signed)
Recently admitted with the following issues: Pancreatitis, transaminitis (EtOH?),  AKI: Presented with abdominal pain, found to have pancreatitis and transaminitis as well as acute kidney injury. Was treated conservatively. Hepatitis serology negative CT and abdominal /ultrasound nonacute.  CT did show extensive atherosclerosis. EtOH was felt to be the culprit, he drinks almost a bottle of wine every night. The only new medication he takes is Jardiance (not known to cause pancreatitis or hepatitis) Plan: CMP, CBC, amylase, lipase. EtOH strongly recommend moderation or full abstinence. GI referral for above issues and also weight loss and change in bowel habits. DM: Due to recent AKI will stop Metformin although it could be restarted at some point. Ambulatory CBGs AM: 96, 104, 111. CBGs in the afternoon: 218, 237, 233. Current meds:, Insulin 15 units daily,Jardiance Refer to Endo here at this location. HTN: Currently on amlodipine, metoprolol, losartan DC'd due to AKI.  Ambulatory BPs range from 145-152. Checking labs today, consider restart losartan. Follow-up 3 weeks

## 2020-06-30 ENCOUNTER — Telehealth: Payer: Self-pay | Admitting: Internal Medicine

## 2020-06-30 NOTE — Telephone Encounter (Signed)
Spoke w/ Caren Griffins- informed that she only dropped off signed authorization to complete forms but no forms. She will contact [employer] to have them fax over the rest to [place]. Requesting for dates 06/09/2020 to 06/30/2020.

## 2020-06-30 NOTE — Telephone Encounter (Signed)
Patient's wife dropped of disability form for provider to sign. --Pt request [place] to fax Completed form to [employer] @ [phone] & to contact them when form complete (they will come by to pick up original) call [phone].  --thanks gail

## 2020-06-30 NOTE — Telephone Encounter (Signed)
Did you discuss disability paperwork w/ Pt at visit? When can he return to work?

## 2020-07-01 ENCOUNTER — Ambulatory Visit: Payer: Medicare Other | Admitting: Gastroenterology

## 2020-07-01 DIAGNOSIS — N1832 Chronic kidney disease, stage 3b: Secondary | ICD-10-CM | POA: Insufficient documentation

## 2020-07-01 DIAGNOSIS — N182 Chronic kidney disease, stage 2 (mild): Secondary | ICD-10-CM | POA: Insufficient documentation

## 2020-07-01 NOTE — Progress Notes (Addendum)
Cardiology Office Note   Date:  07/02/2020   ID:  Steven [name], Steven [name] October 20, 1945, MRN [id number]  PCP:  Colon Branch, MD  Cardiologist:   Minus Breeding, MD    Chief Complaint  Patient presents with  . Coronary Artery Disease      History of Present Illness: Steven [name] is a 74 y.o. male who presents for follow up of CAD and coronary stent.  Since I last saw him he had prostate surgery for benign prostatic hypertrophy.  He also had pancreatitis and November.  I reviewed these records.  There was some question whether this was related to the wine he drinks at night.  He did have elevated liver enzymes.  I see that he was taken off of Crestor and Zetia.  He has been back to work.  He is active. The patient denies any new symptoms such as chest discomfort, neck or arm discomfort. There has been no new shortness of breath, PND or orthopnea. There have been no reported palpitations, presyncope or syncope.   Past Medical History:  Diagnosis Date  . Allergy   . Arthritis    Right Hip  . Atrial fibrillation ([place])   . BPH (benign prostatic hyperplasia)   . COPD (chronic obstructive pulmonary disease) ([place])   . Coronary artery disease    Lexiscan Myoview 7/14:  Inf scar with minimal reversibility, inf HK, EF 45%;  Low risk.  Promus LAD stent placed in [place].  100% RCA  . Diabetes 1.5, managed as type 1 ([place])   . Diverticulosis    on CT  . Dyslipidemia   . GERD (gastroesophageal reflux disease)    has used Zantac in the past   . Glaucoma   . Hammer toes, bilateral   . Hernia   . Hyperlipidemia   . Hypertension   . Peripheral vascular disease ([place])    a. s/p R fem-pop BPG 01/2013 (Dr. [name])  . 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 ABDOMINAL AORTAGRAM N/A 12/10/2012   Procedure: ABDOMINAL Maxcine Ham;  Surgeon: Serafina Mitchell, MD;  [facility] CATH LAB;  Service:  Cardiovascular;  Laterality: N/A;  . CARDIAC CATHETERIZATION     with stent placement  . FEMORAL-POPLITEAL BYPASS GRAFT Right 02/07/2013   Procedure: BYPASS GRAFT FEMORAL-POPLITEAL ARTERY- RIGHT;  Surgeon: Mal Misty, MD;  Location: [facility] OR;  Service: Vascular;  Laterality: Right;  Right femoral to below knee popliteal bypass using right non reversed great saphenous vein  . INGUINAL HERNIA REPAIR  2010   Right  . INGUINAL HERNIA REPAIR  09/11/2018  . INTRAOPERATIVE ARTERIOGRAM Right 02/07/2013   Procedure: Attempted INTRA OPERATIVE ARTERIOGRAM;  Surgeon: Mal Misty, MD;  Location: [place];  Service: Vascular;  Laterality: Right;  . IR RETROGRADE PYELOGRAM  12/10/2019  . TRANSURETHRAL RESECTION OF PROSTATE  12/10/2019     Current Outpatient Medications  Medication Sig Dispense Refill  . amLODipine (NORVASC) 10 MG tablet Take 1 tablet (10 mg total) by mouth daily. 90 tablet 1  . aspirin EC 81 MG tablet Take 81 mg by mouth daily.    . blood glucose meter kit and supplies Dispense based on patient and insurance preference. Check blood sugars twice daily. Dx: E11.9 1 each 0  . Blood Glucose Monitoring Suppl (ONETOUCH VERIO IQ SYSTEM) w/Device KIT Use to check blood sugar 2 times per  day. 1 kit 2  . cetirizine (ZYRTEC) 10 MG tablet Take 10 mg by mouth daily.    . empagliflozin (JARDIANCE) 10 MG TABS tablet Take 1 tablet (10 mg total) by mouth daily before breakfast. (Patient taking differently: Take 10 mg by mouth every evening.) 90 tablet 1  . famotidine (PEPCID) 20 MG tablet Take 1 tablet (20 mg total) by mouth 2 (two) times daily. 60 tablet 1  . fluticasone (FLONASE) 50 MCG/ACT nasal spray Place 2 sprays into both nostrils daily. 16 g 3  . glucose blood (ONETOUCH VERIO) test strip Use to check blood sugar 2 times per day 200 each 4  . insulin glargine, 1 Unit Dial, (TOUJEO SOLOSTAR) 300 UNIT/ML Solostar Pen Inject 15 Units into the skin daily.    . Insulin Pen Needle (BD PEN NEEDLE NANO  U/F) 32G X 4 MM MISC use as directed with INSULIN 100 each 4  . metoprolol succinate (TOPROL-XL) 100 MG 24 hr tablet Take 1.5 tablets (150 mg total) by mouth daily. 135 tablet 1  . mometasone (NASONEX) 50 MCG/ACT nasal spray PLACE 2 SPRAYS INTO THE NOSE DAILY 17 g 3  . ondansetron (ZOFRAN ODT) 4 MG disintegrating tablet Take 1 tablet (4 mg total) by mouth every 8 (eight) hours as needed for nausea or vomiting. 20 tablet 0  . ONETOUCH DELICA LANCETS 16X MISC Use to check blood sugar 2 times per day. 200 each 2  . sucralfate (CARAFATE) 1 g tablet Take 1 tablet (1 g total) by mouth 4 (four) times daily -  with meals and at bedtime. 120 tablet 0  . tamsulosin (FLOMAX) 0.4 MG CAPS capsule TAKE 1 CAPSULE(0.4 MG) BY MOUTH DAILY (Patient taking differently: Take 0.4 mg by mouth daily.) 90 capsule 3  . pravastatin (PRAVACHOL) 80 MG tablet Take 1 tablet (80 mg total) by mouth every evening. 90 tablet 3   No current facility-administered medications for this visit.    Allergies:   Patient has no known allergies.    ROS:  Please see the history of present illness.   Otherwise, review of systems are positive for none.   All other systems are reviewed and negative.    PHYSICAL EXAM: VS:  BP 128/74   Pulse 68   Ht '5\' 5"'  (1.651 m)   Wt 129 lb (58.5 kg)   BMI 21.47 kg/m  , BMI Body mass index is 21.47 kg/m. GENERAL:  Well appearing NECK:  No jugular venous distention, waveform within normal limits, carotid upstroke brisk and symmetric, no bruits, no thyromegaly LUNGS:  Clear to auscultation bilaterally CHEST:  Unremarkable HEART:  PMI not displaced or sustained,S1 and S2 within normal limits, no S3, no S4, no clicks, no rubs, no murmurs ABD:  Flat, positive bowel sounds normal in frequency in pitch, no bruits, no rebound, no guarding, no midline pulsatile mass, no hepatomegaly, no splenomegaly EXT:  2 plus pulses decreased left dorsalis pedis and posterior tibialis, no edema, no cyanosis no clubbing,  bilateral femoral bruits  EKG:  EKG is  ordered today. The ekg ordered today demonstrates sinus rhythm, rate 68, axis within normal limits, intervals within normal limits, old inferior infarct, nonspecific inferior lateral ST depression.  Significant criteria for left ventricular hypertrophy, premature ventricular contraction   Recent Labs: 01/09/2020: TSH 1.16 06/11/2020: Hemoglobin 12.0; Magnesium 1.7; Platelets 142 06/22/2020: ALT 46; BUN 21; Creatinine, Ser 1.73; Potassium 4.2; Sodium 143    Lipid Panel    Component Value Date/Time   CHOL 173 06/22/2020  1232   CHOL 163 06/07/2018 0915   TRIG 111.0 06/22/2020 1232   HDL 57.60 06/22/2020 1232   HDL 79 06/07/2018 0915   CHOLHDL 3 06/22/2020 1232   VLDL 22.2 06/22/2020 1232   LDLCALC 94 06/22/2020 1232   LDLCALC 64 06/07/2018 0915   Lab Results  Component Value Date   HGBA1C 7.0 (H) 06/07/2020      Wt Readings from Last 3 Encounters:  07/02/20 129 lb (58.5 kg)  06/22/20 127 lb 12.8 oz (58 kg)  06/09/20 122 lb 2.2 oz (55.4 kg)      Other studies Reviewed: Additional studies/ records that were reviewed today include: Hospital records, labs Review of the above records demonstrates: See elsewhere  ASSESSMENT AND PLAN:  CAD  He has no symptoms.  Has been quite active.  I will continue with aggressive secondary risk reduction.   Dyslipidemia His LDL was 94 is not at target and he is off of Crestor.  I am going to start pravastatin 80 mg daily which will not affect his liver.  I do note that his liver enzymes have returned to near baseline.  I will check a lipid profile in about 10 weeks.   HTN The blood pressure is at target.  No change in therapy.   CKD Previous creatinine was 1.73.  It was significantly elevated in the hospital when he was dehydrated but came back down.   DM His hemoglobin A1c was 7.0.  He has had his meds adjusted and I will defer to Dr. Larose Kells.   LVH He has significant left ventricular  hypertrophy by voltage criteria on EKG I am going to check an echocardiogram.  PVD He does have some claudication in his left leg and is seeing [facility].    Current medicines are reviewed at length with the patient today.  The patient does not have concerns regarding medicines  The following changes have been made: As above  Labs/ tests ordered today include:    Orders Placed This Encounter  Procedures  . Lipid panel  . Hepatic function panel  . EKG 12-Lead  . ECHOCARDIOGRAM COMPLETE     Disposition:   FU with me in 12 months   Signed, Minus Breeding, MD  07/02/2020 8:26 AM    [facility]

## 2020-07-02 ENCOUNTER — Encounter: Payer: Self-pay | Admitting: Cardiology

## 2020-07-02 ENCOUNTER — Ambulatory Visit (INDEPENDENT_AMBULATORY_CARE_PROVIDER_SITE_OTHER): Payer: Medicare Other | Admitting: Cardiology

## 2020-07-02 ENCOUNTER — Other Ambulatory Visit: Payer: Self-pay

## 2020-07-02 VITALS — BP 128/74 | HR 68 | Ht 65.0 in | Wt 129.0 lb

## 2020-07-02 DIAGNOSIS — I10 Essential (primary) hypertension: Secondary | ICD-10-CM | POA: Diagnosis not present

## 2020-07-02 DIAGNOSIS — E118 Type 2 diabetes mellitus with unspecified complications: Secondary | ICD-10-CM

## 2020-07-02 DIAGNOSIS — N182 Chronic kidney disease, stage 2 (mild): Secondary | ICD-10-CM

## 2020-07-02 DIAGNOSIS — I2510 Atherosclerotic heart disease of native coronary artery without angina pectoris: Secondary | ICD-10-CM

## 2020-07-02 DIAGNOSIS — E785 Hyperlipidemia, unspecified: Secondary | ICD-10-CM | POA: Diagnosis not present

## 2020-07-02 DIAGNOSIS — I517 Cardiomegaly: Secondary | ICD-10-CM

## 2020-07-02 MED ORDER — PRAVASTATIN SODIUM 80 MG PO TABS: 80.0000 mg | ORAL_TABLET | Freq: Every evening | ORAL | 3 refills | Status: DC

## 2020-07-02 NOTE — Patient Instructions (Addendum)
Medication Instructions:  Start Pravastatin 80mg  daily in the evening *If you need a refill on your cardiac medications before your next appointment, please call your pharmacy*  Lab Work: Your physician recommends that you return for lab work in: 10 weeks - February 18th - (Fasting Lipids / Liver)  Testing/Procedures: Your physician has requested that you have an echocardiogram. Echocardiography is a painless test that uses sound waves to create images of your heart. It provides your doctor with information about the size and shape of your heart and how well your heart's chambers and valves are working. This procedure takes approximately one hour. There are no restrictions for this procedure. [address]  Follow-Up: At [facility], you and your health needs are our priority.  As part of our continuing mission to provide you with exceptional heart care, we have created designated Provider Care Teams.  These Care Teams include your primary Cardiologist (physician) and Advanced Practice Providers (APPs -  Physician Assistants and Nurse Practitioners) who all work together to provide you with the care you need, when you need it.    Your next appointment:   12 month(s)  You will receive a reminder letter in the mail two months in advance. If you don't receive a letter, please call our office to schedule the follow-up appointment.  The format for your next appointment:   In Person  Provider:   Dr. Minus Breeding

## 2020-07-05 NOTE — Telephone Encounter (Signed)
Patient's spouse called to check the status of forms dropped off,per last note and after speaking to [facility] we have no recieved the proper documentation in order to complete patient's request  From [place]

## 2020-07-06 NOTE — Telephone Encounter (Signed)
Form from [employer] received. Completed, awaiting MD signature, placed in PCP red folder.

## 2020-07-06 NOTE — Telephone Encounter (Signed)
Pt's wife dropped off other paperwork that is the rest of the paperwork that needs to be filled out. (2 pages form Return to work Authorization Form) Pt would like to be called when copy is ready to pick up and would like document to be faxed when ready also. Document put at front office tray under providers name.

## 2020-07-07 ENCOUNTER — Encounter: Payer: Self-pay | Admitting: Nurse Practitioner

## 2020-07-07 ENCOUNTER — Ambulatory Visit: Payer: Medicare Other | Admitting: Nurse Practitioner

## 2020-07-07 VITALS — BP 130/60 | HR 64 | Ht 65.0 in | Wt 129.8 lb

## 2020-07-07 DIAGNOSIS — K5900 Constipation, unspecified: Secondary | ICD-10-CM

## 2020-07-07 DIAGNOSIS — R748 Abnormal levels of other serum enzymes: Secondary | ICD-10-CM | POA: Diagnosis not present

## 2020-07-07 DIAGNOSIS — Z0279 Encounter for issue of other medical certificate: Secondary | ICD-10-CM

## 2020-07-07 DIAGNOSIS — R7989 Other specified abnormal findings of blood chemistry: Secondary | ICD-10-CM

## 2020-07-07 NOTE — Patient Instructions (Signed)
Increase water intake to a minium of 48 ounces per day  Take a daily stool softener at bedtime  Follow up as needed  If you are age 72 or older, your body mass index should be between 23-30. Your Body mass index is 21.6 kg/m. If this is out of the aforementioned range listed, please consider follow up with your Primary Care Provider.  If you are age 56 or younger, your body mass index should be between 19-25. Your Body mass index is 21.6 kg/m. If this is out of the aformentioned range listed, please consider follow up with your Primary Care Provider.    Due to recent changes in healthcare laws, you may see the results of your imaging and laboratory studies on MyChart before your provider has had a chance to review them.  We understand that in some cases there may be results that are confusing or concerning to you. Not all laboratory results come back in the same time frame and the provider may be waiting for multiple results in order to interpret others.  Please give [place] 48 hours in order for your provider to thoroughly review all the results before contacting the office for clarification of your results.   I appreciate the  opportunity to care for you  Thank You   [place]

## 2020-07-07 NOTE — Progress Notes (Signed)
Addendum: Reviewed and agree with assessment and management plan. Elayjah Chaney M, MD  

## 2020-07-07 NOTE — Progress Notes (Signed)
ASSESSMENT AND PLAN    # Recent admission for nausea, vomiting, upper abdominal pain, mildly elevated lipase and elevated liver chemistries.  We saw him in consultation, felt he had gastroenteritis with abnormal liver chemistries being reactive and / or related to Etoh and elevated lipase secondary to nausea and vomiting.  --Liver chemistries have normalized. He hasn't had any Etoh since hospital discharge but also Zetia was hold upon hospital discharge.  --Abdominal pain, nausea and vomiting have also resolved. Lipase improved to 119 but recently rose again to 279 in the absence of symptoms. CT scan in hospital was negative however it was a non-contrast study. Unclear why lipase is still elevated.  --Will repeat lipase in a couple of weeks. He will continue to avoid Etoh. If lipase remains elevated he may need repeat imaging of pancreas though renal insufficiency may preclude use of IV --No need to refill Carafate, he is better . Also appears to have renal insufficiency.      # Constipation with hard stool --Only drinking a bottle of water a day. Needs to increase water intake to a minimun of 48 oz daily --Colace , one at bedtime.   # GERD, asymptomatic --Continue BID Pepcid.    HISTORY OF PRESENT ILLNESS     Primary Gastroenterologist :  Zenovia Jarred, MD  Chief Complaint : hospital follow up   Steven [name] is a 74 y.o. male with a PMH / [place] of history of CAD/ remote DES, DM, CKD,  hypertension, PVD, COPD, atrial fibrillation, colon polyps, and GERD.  Patient was hospitalized mid November for upper abdominal pain and vomiting.  Noncontrast CT scan unrevealing.  Abdominal ultrasound negative for gallstones or gallbladder wall thickening.  CBD was 14 mm but this was later addended to say that it was 1.4 mm.  His lipase was elevated at 175.  AST 277, ALT 290, his alk phos and total bilirubin were normal.  Liver chemistries previously normal except for mildly elevated AST several  months earlier.  Overall clinical picture consistent with acute gastroenteritis.  Liver abnormalities probably due in part to alcohol and/or reactive in nature.  No further work-up recommended,  advised to follow-up in the office  Interval history:   Patient's lipase did improve to ~ 120 on 06/12/20 but on 11/30 was back up to 279 and amylase was 222. He lost several pounds over the last year but weight has been stable over the last 6 months.  He was drinking 1-2 glasses most days of the week but not since hospital discharge. He hasn't had any Etoh since discharge.  He hasn't had any further abdominal pain nor nausea / vomiting. He takes a low dose daily ASA, no other NSAIDs. He does complain of stool recently. He admits to not drinking much water, maybe one bottle a day   Previous Endoscopic Evaluations / Pertinent Studies:   May 2019 Surveillance colonoscopy -One 3 mm polyp at the splenic flexure, removed with a cold snare. Resected and retrieved. - One 5 mm polyp in the sigmoid colon, removed with a cold snare. Resected and retrieved. - Moderate diverticulosis in the sigmoid colon, in the descending colon and in the ascending colon. - Small internal hemorrhoids.   Polyp path - tubular adenoma without HGD  Past Medical History:  Diagnosis Date  . Allergy   . Arthritis    Right Hip  . Atrial fibrillation ([place])   . BPH (benign prostatic hyperplasia)   . COPD (chronic obstructive pulmonary  disease) ([place])   . Coronary artery disease    Lexiscan Myoview 7/14:  Inf scar with minimal reversibility, inf HK, EF 45%;  Low risk.  Promus LAD stent placed in [place].  100% RCA  . Diabetes 1.5, managed as type 1 ([place])   . Diverticulosis    on CT  . Dyslipidemia   . GERD (gastroesophageal reflux disease)    has used Zantac in the past   . Glaucoma   . Hammer toes, bilateral   . Hernia   . Hyperlipidemia   . Hypertension   . Peripheral vascular disease ([place])    a. s/p R fem-pop BPG 01/2013  (Dr. [name])  . 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   Current Outpatient Medications  Medication Sig Dispense Refill  . amLODipine (NORVASC) 10 MG tablet Take 1 tablet (10 mg total) by mouth daily. 90 tablet 1  . aspirin EC 81 MG tablet Take 81 mg by mouth daily.    . blood glucose meter kit and supplies Dispense based on patient and insurance preference. Check blood sugars twice daily. Dx: E11.9 1 each 0  . Blood Glucose Monitoring Suppl (ONETOUCH VERIO IQ SYSTEM) w/Device KIT Use to check blood sugar 2 times per day. 1 kit 2  . cetirizine (ZYRTEC) 10 MG tablet Take 10 mg by mouth daily.    . empagliflozin (JARDIANCE) 10 MG TABS tablet Take 1 tablet (10 mg total) by mouth daily before breakfast. (Patient taking differently: Take 10 mg by mouth every evening.) 90 tablet 1  . famotidine (PEPCID) 20 MG tablet Take 1 tablet (20 mg total) by mouth 2 (two) times daily. 60 tablet 1  . fluticasone (FLONASE) 50 MCG/ACT nasal spray Place 2 sprays into both nostrils daily. 16 g 3  . glucose blood (ONETOUCH VERIO) test strip Use to check blood sugar 2 times per day 200 each 4  . insulin glargine, 1 Unit Dial, (TOUJEO SOLOSTAR) 300 UNIT/ML Solostar Pen Inject 15 Units into the skin daily.    . Insulin Pen Needle (BD PEN NEEDLE NANO U/F) 32G X 4 MM MISC use as directed with INSULIN 100 each 4  . metoprolol succinate (TOPROL-XL) 100 MG 24 hr tablet Take 1.5 tablets (150 mg total) by mouth daily. 135 tablet 1  . mometasone (NASONEX) 50 MCG/ACT nasal spray PLACE 2 SPRAYS INTO THE NOSE DAILY 17 g 3  . ondansetron (ZOFRAN ODT) 4 MG disintegrating tablet Take 1 tablet (4 mg total) by mouth every 8 (eight) hours as needed for nausea or vomiting. 20 tablet 0  . ONETOUCH DELICA LANCETS 79G MISC Use to check blood sugar 2  times per day. 200 each 2  . pravastatin (PRAVACHOL) 80 MG tablet Take 1 tablet (80 mg total) by mouth every evening. 90 tablet 3  . sucralfate (CARAFATE) 1 g tablet Take 1 tablet (1 g total) by mouth 4 (four) times daily -  with meals and at bedtime. 120 tablet 0  . tamsulosin (FLOMAX) 0.4 MG CAPS capsule TAKE 1 CAPSULE(0.4 MG) BY MOUTH DAILY (Patient taking differently: Take 0.4 mg by mouth daily.) 90 capsule 3   No current facility-administered medications for this visit.    Review of Systems: No chest pain. No shortness of breath. No urinary complaints.   PHYSICAL EXAM :    Wt Readings  from Last 3 Encounters:  07/02/20 129 lb (58.5 kg)  06/22/20 127 lb 12.8 oz (58 kg)  06/09/20 122 lb 2.2 oz (55.4 kg)    BP 130/60   Pulse 64   Ht '5\' 5"'  (1.651 m)   Wt 129 lb 12.8 oz (58.9 kg)   BMI 21.60 kg/m  Constitutional:  Pleasant thin male in no acute distress. Psychiatric: Normal mood and affect. Behavior is normal. EENT: Pupils normal.  Conjunctivae are normal. No scleral icterus. Neck supple.  Cardiovascular: Normal rate, regular rhythm. No edema Pulmonary/chest: Effort normal and breath sounds normal. No wheezing, rales or rhonchi. Abdominal: Soft, nondistended, nontender. Bowel sounds active throughout. There are no masses palpable. No hepatomegaly. Neurological: Alert and oriented to person place and time. Skin: Skin is warm and dry. No rashes noted.  Tye Savoy, NP  07/07/2020, 8:47 AM   I spent 30 minutes total reviewing records, obtaining history, performing exam, counseling patient and documenting visit / findings.   Cc:  Colon Branch, MD

## 2020-07-08 NOTE — Telephone Encounter (Signed)
Form faxed to [employer] at [phone]. Copy sent for scanning.

## 2020-07-08 NOTE — Telephone Encounter (Signed)
Spoke w/ Caren Griffins- informed that form has been faxed and ready for pick up. Caren Griffins verbalized understanding.

## 2020-07-13 ENCOUNTER — Ambulatory Visit (INDEPENDENT_AMBULATORY_CARE_PROVIDER_SITE_OTHER): Payer: Medicare Other | Admitting: Internal Medicine

## 2020-07-13 ENCOUNTER — Other Ambulatory Visit: Payer: Self-pay

## 2020-07-13 VITALS — BP 154/64 | HR 82 | Temp 100.3°F | Ht 65.0 in | Wt 129.0 lb

## 2020-07-13 DIAGNOSIS — E1159 Type 2 diabetes mellitus with other circulatory complications: Secondary | ICD-10-CM

## 2020-07-13 DIAGNOSIS — I10 Essential (primary) hypertension: Secondary | ICD-10-CM | POA: Diagnosis not present

## 2020-07-13 DIAGNOSIS — Z794 Long term (current) use of insulin: Secondary | ICD-10-CM | POA: Diagnosis not present

## 2020-07-13 DIAGNOSIS — N182 Chronic kidney disease, stage 2 (mild): Secondary | ICD-10-CM

## 2020-07-13 NOTE — Patient Instructions (Signed)
Drink plenty of water  Avoid taking medication such as ibuprofen or naproxen, they may for your kidney.  Check the  blood pressure twice a week. BP GOAL is between 110/65 and  135/85. If it is consistently higher or lower, let me know  Continue checking your blood sugars  GO TO THE LAB : Get the blood work     [place], [place] back for a checkup in 2 to 3 months

## 2020-07-13 NOTE — Progress Notes (Signed)
Subjective:    Patient ID: Steven [name], male    DOB: 1945-10-26, 74 y.o.   MRN: [id number]  DOS:  07/13/2020 Type of visit - description: Follow-up Since the last office visit he is doing well. Saw GI, cardiology, notes reviewed. We reviewed his ambulatory BPs and CBGs. Medication list updated    Wt Readings from Last 3 Encounters:  07/13/20 129 lb (58.5 kg)  07/07/20 129 lb 12.8 oz (58.9 kg)  07/02/20 129 lb (58.5 kg)     BP Readings from Last 3 Encounters:  07/13/20 (!) 154/64  07/07/20 130/60  07/02/20 128/74     Review of Systems Report appetite is good. No nausea, vomiting, diarrhea. No EtOH  Past Medical History:  Diagnosis Date  . Allergy   . Arthritis    Right Hip  . Atrial fibrillation ([place])   . BPH (benign prostatic hyperplasia)   . COPD (chronic obstructive pulmonary disease) ([place])   . Coronary artery disease    Lexiscan Myoview 7/14:  Inf scar with minimal reversibility, inf HK, EF 45%;  Low risk.  Promus LAD stent placed in [place].  100% RCA  . Diabetes 1.5, managed as type 1 ([place])   . Diverticulosis    on CT  . Dyslipidemia   . GERD (gastroesophageal reflux disease)    has used Zantac in the past   . Glaucoma   . Hammer toes, bilateral   . Hernia   . Hyperlipidemia   . Hypertension   . Peripheral vascular disease ([place])    a. s/p R fem-pop BPG 01/2013 (Dr. [name])  . 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 ABDOMINAL AORTAGRAM N/A 12/10/2012   Procedure: ABDOMINAL Maxcine Ham;  Surgeon: Serafina Mitchell, MD;  Location: [facility] CATH LAB;  Service: Cardiovascular;  Laterality: N/A;  . CARDIAC CATHETERIZATION     with stent placement  . FEMORAL-POPLITEAL BYPASS GRAFT Right 02/07/2013   Procedure: BYPASS GRAFT FEMORAL-POPLITEAL ARTERY- RIGHT;  Surgeon: Mal Misty, MD;  Location: [facility] OR;  Service: Vascular;  Laterality: Right;  Right femoral to below  knee popliteal bypass using right non reversed great saphenous vein  . INGUINAL HERNIA REPAIR  2010   Right  . INGUINAL HERNIA REPAIR  09/11/2018  . INTRAOPERATIVE ARTERIOGRAM Right 02/07/2013   Procedure: Attempted INTRA OPERATIVE ARTERIOGRAM;  Surgeon: Mal Misty, MD;  Location: [facility];  Service: Vascular;  Laterality: Right;  . IR RETROGRADE PYELOGRAM  12/10/2019  . TRANSURETHRAL RESECTION OF PROSTATE  12/10/2019    Allergies as of 07/13/2020   No Known Allergies     Medication List       Accurate as of July 13, 2020 11:59 PM. If you have any questions, ask your nurse or doctor.        amLODipine 10 MG tablet Commonly known as: NORVASC Take 1 tablet (10 mg total) by mouth daily.   aspirin EC 81 MG tablet Take 81 mg by mouth daily.   blood glucose meter kit and supplies Dispense based on patient and insurance preference. Check blood sugars twice daily. Dx: E11.9   cetirizine 10 MG tablet Commonly known as: ZYRTEC Take 10 mg by mouth daily.   empagliflozin 10 MG Tabs tablet Commonly known as: Jardiance Take 1 tablet (10 mg total) by mouth daily before breakfast. What changed: when to take this   famotidine 20 MG tablet Commonly  known as: Pepcid Take 1 tablet (20 mg total) by mouth 2 (two) times daily.   fluticasone 50 MCG/ACT nasal spray Commonly known as: FLONASE Place 2 sprays into both nostrils daily.   glucose blood test strip Commonly known as: OneTouch Verio Use to check blood sugar 2 times per day   Insulin Pen Needle 32G X 4 MM Misc Commonly known as: BD Pen Needle Nano U/F use as directed with INSULIN   losartan 100 MG tablet Commonly known as: COZAAR Take 100 mg by mouth daily.   metFORMIN 1000 MG tablet Commonly known as: GLUCOPHAGE   metoprolol succinate 100 MG 24 hr tablet Commonly known as: TOPROL-XL Take 1.5 tablets (150 mg total) by mouth daily.   mometasone 50 MCG/ACT nasal spray Commonly known as: NASONEX PLACE 2 SPRAYS  INTO THE NOSE DAILY   ondansetron 4 MG disintegrating tablet Commonly known as: Zofran ODT Take 1 tablet (4 mg total) by mouth every 8 (eight) hours as needed for nausea or vomiting.   OneTouch Delica Lancets 51W Misc Use to check blood sugar 2 times per day.   OneTouch Verio IQ System w/Device Kit Use to check blood sugar 2 times per day.   pravastatin 80 MG tablet Commonly known as: PRAVACHOL Take 1 tablet (80 mg total) by mouth every evening.   sucralfate 1 g tablet Commonly known as: CARAFATE Take 1 tablet (1 g total) by mouth 4 (four) times daily -  with meals and at bedtime.   tamsulosin 0.4 MG Caps capsule Commonly known as: FLOMAX TAKE 1 CAPSULE(0.4 MG) BY MOUTH DAILY What changed: See the new instructions.   Toujeo SoloStar 300 UNIT/ML Solostar Pen Generic drug: insulin glargine (1 Unit Dial) Inject 15 Units into the skin daily.          Objective:   Physical Exam BP (!) 154/64 (BP Location: Right Arm, Patient Position: Sitting, Cuff Size: Large)   Pulse 82   Temp 100.3 F (37.9 C) (Oral)   Ht _0  (1.651 m)   Wt 129 lb (58.5 kg)   SpO2 97%   BMI 21.47 kg/m   General:   Well developed, NAD, BMI noted. HEENT:  Normocephalic . Face symmetric, atraumatic Lungs:  CTA B Normal respiratory effort, no intercostal retractions, no accessory muscle use. Heart: RRR,  no murmur.  Lower extremities: no pretibial edema bilaterally  Skin: Not pale. Not jaundice Neurologic:  alert & oriented X3.  Speech normal, gait appropriate for age and unassisted Psych--  Cognition and judgment appear intact.  Cooperative with normal attention span and concentration.  Behavior appropriate. No anxious or depressed appearing.      Assessment     Assessment (new pt to me 12/2019) DM HTN Hyperlipidemia CRI: Creatinine fluctuates 1.70- 1.40 CV: --CAD, Dr. Percival Spanish --PAD  Sickle cell anemia COPD BPH, TURP and bilateral retrograde pyelograms 12/10/2019   PLAN DM:  Since the last office visit self restart Metformin, noted his CBGs improved. Currently also takes Jardiance, insulin 15 units. CBGs fasting: 163, 133, 157, 166. Reassess in 2 to 3 months HTN:   Since the last office visit self restart losartan and metoprolol, also takes amlodipine, ambulatory BPs: Range 126-1 50.  Last two BPs on record at the GI and cardiology office very good.  No change, check a BMP-CBC. AKI: Recheck a BMP today, encourage to drink plenty of water and avoid NSAIDs.  Based on results consider adjust Metformin and losartan. Pancreatitis, transaminitis: Saw GI 07/07/2020, they noted that the  LFTs were near normal after the admission however the lipase increased to 279 without any symptoms.  They plan to recheck the lipase by the end of the month. EtOH: Used to drink 1 bottle of wine a night, recommend to continue avoiding EtOH. GERD: Controlled on Pepcid twice daily per GI note CAD: Saw cardiology 07/02/2020.  No CAD symptoms, they switch Crestor to pravastatin due to recent LFT increase. LVH was noted on EKG, echo ordered. RTC to 3 months  This visit occurred during the SARS-CoV-2 public health emergency.  Safety protocols were in place, including screening questions prior to the visit, additional usage of staff PPE, and extensive cleaning of exam room while observing appropriate contact time as indicated for disinfecting solutions.

## 2020-07-14 ENCOUNTER — Telehealth: Payer: Self-pay | Admitting: Internal Medicine

## 2020-07-14 LAB — CBC WITH DIFFERENTIAL/PLATELET
Basophils Absolute: 0.1 10*3/uL (ref 0.0–0.1)
Basophils Relative: 0.9 % (ref 0.0–3.0)
Eosinophils Absolute: 0 10*3/uL (ref 0.0–0.7)
Eosinophils Relative: 0.4 % (ref 0.0–5.0)
HCT: 42.2 % (ref 39.0–52.0)
Hemoglobin: 13.8 g/dL (ref 13.0–17.0)
Lymphocytes Relative: 20 % (ref 12.0–46.0)
Lymphs Abs: 1.5 10*3/uL (ref 0.7–4.0)
MCHC: 32.8 g/dL (ref 30.0–36.0)
MCV: 89 fl (ref 78.0–100.0)
Monocytes Absolute: 1.1 10*3/uL — ABNORMAL HIGH (ref 0.1–1.0)
Monocytes Relative: 14 % — ABNORMAL HIGH (ref 3.0–12.0)
Neutro Abs: 4.9 10*3/uL (ref 1.4–7.7)
Neutrophils Relative %: 64.7 % (ref 43.0–77.0)
Platelets: 124 10*3/uL — ABNORMAL LOW (ref 150.0–400.0)
RBC: 4.74 Mil/uL (ref 4.22–5.81)
RDW: 14.8 % (ref 11.5–15.5)
WBC: 7.6 10*3/uL (ref 4.0–10.5)

## 2020-07-14 LAB — BASIC METABOLIC PANEL
BUN: 23 mg/dL (ref 6–23)
CO2: 27 mEq/L (ref 19–32)
Calcium: 10.1 mg/dL (ref 8.4–10.5)
Chloride: 98 mEq/L (ref 96–112)
Creatinine, Ser: 2.23 mg/dL — ABNORMAL HIGH (ref 0.40–1.50)
GFR: 28.77 mL/min — ABNORMAL LOW (ref >60.00)
Glucose, Bld: 209 mg/dL — ABNORMAL HIGH (ref 70–99)
Potassium: 4.2 mEq/L (ref 3.5–5.1)
Sodium: 135 mEq/L (ref 135–145)

## 2020-07-14 NOTE — Telephone Encounter (Signed)
Creatinine increased again. I called the patient, we agreed on stop Metformin and losartan. Monitor BPs. His kidneys were visualized by CT 06/09/2020 and they were normal with no obstruction.  We will reconsider nephrology referral if creatinine not back to baseline (over the year has been 1.4).  Please arrange for nurse visit in 10 days: BP check, BMP, DX kidney failure.

## 2020-07-14 NOTE — Assessment & Plan Note (Signed)
DM: Since the last office visit self restart Metformin, noted his CBGs improved. Currently also takes Jardiance, insulin 15 units. CBGs fasting: 163, 133, 157, 166. Reassess in 2 to 3 months HTN:   Since the last office visit self restart losartan and metoprolol, also takes amlodipine, ambulatory BPs: Range 126-1 50.  Last two BPs on record at the GI and cardiology office very good.  No change, check a BMP-CBC. AKI: Recheck a BMP today, encourage to drink plenty of water and avoid NSAIDs.  Based on results consider adjust Metformin and losartan. Pancreatitis, transaminitis: Saw GI 07/07/2020, they noted that the LFTs were near normal after the admission however the lipase increased to 279 without any symptoms.  They plan to recheck the lipase by the end of the month. EtOH: Used to drink 1 bottle of wine a night, recommend to continue avoiding EtOH. GERD: Controlled on Pepcid twice daily per GI note CAD: Saw cardiology 07/02/2020.  No CAD symptoms, they switch Crestor to pravastatin due to recent LFT increase. LVH was noted on EKG, echo ordered. RTC to 3 months

## 2020-07-15 NOTE — Telephone Encounter (Signed)
Spoke with patient he was driving unable to talk and make appointment, will call back when able too.

## 2020-07-15 NOTE — Telephone Encounter (Signed)
Called patient and left VM to return call to office. 

## 2020-07-27 ENCOUNTER — Telehealth: Payer: Self-pay | Admitting: *Deleted

## 2020-07-27 DIAGNOSIS — N182 Chronic kidney disease, stage 2 (mild): Secondary | ICD-10-CM

## 2020-07-27 NOTE — Telephone Encounter (Signed)
Thank you :)

## 2020-07-27 NOTE — Telephone Encounter (Signed)
Pt has lab appt 07/29/20 but I do not see any orders in Epic. Schedule notes says BMP.  Please place order.

## 2020-07-27 NOTE — Telephone Encounter (Signed)
BMP ordered

## 2020-07-28 ENCOUNTER — Other Ambulatory Visit: Payer: Self-pay | Admitting: Internal Medicine

## 2020-07-29 ENCOUNTER — Other Ambulatory Visit: Payer: Self-pay

## 2020-07-29 ENCOUNTER — Other Ambulatory Visit (INDEPENDENT_AMBULATORY_CARE_PROVIDER_SITE_OTHER): Payer: Medicare Other

## 2020-07-29 ENCOUNTER — Other Ambulatory Visit: Payer: Medicare Other

## 2020-07-29 ENCOUNTER — Ambulatory Visit (INDEPENDENT_AMBULATORY_CARE_PROVIDER_SITE_OTHER): Payer: Medicare Other | Admitting: Internal Medicine

## 2020-07-29 VITALS — BP 120/60 | HR 77

## 2020-07-29 DIAGNOSIS — N182 Chronic kidney disease, stage 2 (mild): Secondary | ICD-10-CM

## 2020-07-29 DIAGNOSIS — I10 Essential (primary) hypertension: Secondary | ICD-10-CM

## 2020-07-29 LAB — BASIC METABOLIC PANEL
BUN: 18 mg/dL (ref 6–23)
CO2: 29 mEq/L (ref 19–32)
Calcium: 10.7 mg/dL — ABNORMAL HIGH (ref 8.4–10.5)
Chloride: 103 mEq/L (ref 96–112)
Creatinine, Ser: 1.49 mg/dL (ref 0.40–1.50)
GFR: 46.66 mL/min — ABNORMAL LOW (ref >60.00)
Glucose, Bld: 191 mg/dL — ABNORMAL HIGH (ref 70–99)
Potassium: 4.2 mEq/L (ref 3.5–5.1)
Sodium: 140 mEq/L (ref 135–145)

## 2020-07-29 NOTE — Progress Notes (Signed)
Pt here for Blood pressure check per Dr. Drue Novel.  Pt currently takes: amlodipine 10mg  and metoprolol succinate 100mg    Pt reports compliance with medication.  BP today @ = 120/60 HR = 77   Pt advised per Dr. , blood pressure looks good today and to keep checking at home and we will see him at next appointment on 09/08/20  Drue Novel, MD

## 2020-08-02 ENCOUNTER — Telehealth: Payer: Self-pay | Admitting: Internal Medicine

## 2020-08-02 NOTE — Telephone Encounter (Signed)
Form in PCP red folder for completion.

## 2020-08-02 NOTE — Telephone Encounter (Signed)
Patients wife dropped off paperwork to be filled out by Larose Kells  Would like to be called on [phone] when ready to be picked up  Paperwork put into [place] folder up front

## 2020-08-03 ENCOUNTER — Ambulatory Visit: Payer: Medicare Other | Admitting: Internal Medicine

## 2020-08-03 ENCOUNTER — Telehealth: Payer: Self-pay | Admitting: Internal Medicine

## 2020-08-03 ENCOUNTER — Ambulatory Visit (HOSPITAL_COMMUNITY): Payer: Medicare Other | Attending: Cardiovascular Disease

## 2020-08-03 ENCOUNTER — Other Ambulatory Visit: Payer: Self-pay

## 2020-08-03 DIAGNOSIS — I517 Cardiomegaly: Secondary | ICD-10-CM | POA: Diagnosis not present

## 2020-08-03 LAB — ECHOCARDIOGRAM COMPLETE
Area-P 1/2: 2.5 cm2
P 1/2 time: 377 msec
S' Lateral: 3 cm

## 2020-08-03 NOTE — Telephone Encounter (Signed)
Spoke w/ Caren Griffins- Pt's wife- informed form is ready for pick up at her convenience.

## 2020-08-03 NOTE — Telephone Encounter (Signed)
Caller [name]  Call Back @ [phone] Fax # [phone]  Calling in reference to request sent to our office . Steven [name] is requesting last office notes and lab results.

## 2020-08-03 NOTE — Telephone Encounter (Signed)
Form completed, faxed to [employer] at [phone]. Copy of form sent for scanning.

## 2020-08-03 NOTE — Telephone Encounter (Signed)
Records sent

## 2020-08-04 NOTE — Telephone Encounter (Signed)
Re-faxed again.

## 2020-08-04 NOTE — Telephone Encounter (Signed)
Steven [name], with [employer]  States she did not received office note from 07/29/2020 , she would like to those notes faxed   Please advuse

## 2020-08-06 ENCOUNTER — Telehealth: Payer: Self-pay | Admitting: Internal Medicine

## 2020-08-06 NOTE — Telephone Encounter (Signed)
Caller: Maranda from [facility] [employer] back [phone]  Toney Rakes states that patient express to her that he did not know how to use his blood glucose meter kit and supplies   Please advice

## 2020-08-06 NOTE — Telephone Encounter (Signed)
Pt to see Endo later this month.

## 2020-08-16 ENCOUNTER — Telehealth: Payer: Self-pay

## 2020-08-16 ENCOUNTER — Other Ambulatory Visit: Payer: Self-pay

## 2020-08-16 MED ORDER — BD PEN NEEDLE NANO U/F 32G X 4 MM MISC: 4 refills | Status: DC

## 2020-08-16 NOTE — Telephone Encounter (Signed)
Steven [name] [facility]) Call back # [phone]  Steven [name] states she spoke with patient in regards to glucometer. Patient still does not know how to use it. Steven [name] is wondering if maybe Dr. Larose Kells could send a referral to home health.

## 2020-08-16 NOTE — Telephone Encounter (Signed)
Form received from [facility]. Completed, awaiting MD signature. Form in PCP red folder.

## 2020-08-16 NOTE — Telephone Encounter (Signed)
Please advise 

## 2020-08-16 NOTE — Telephone Encounter (Signed)
Noted  

## 2020-08-16 NOTE — Telephone Encounter (Signed)
Will forward to Endo.

## 2020-08-16 NOTE — Telephone Encounter (Signed)
Will show the pt on next visit. He has not established care with me yet

## 2020-08-17 NOTE — Telephone Encounter (Signed)
Patient dropped of second form.....  Patient said company needs second one filled out before getting shoes?     If any questions he said call him at [phone]   Forms put into paz bin

## 2020-08-17 NOTE — Telephone Encounter (Signed)
Form sent for scanning.  

## 2020-08-17 NOTE — Telephone Encounter (Signed)
Completed form signed and faxed back to [facility] at [phone]. Last foot exam 2017.

## 2020-08-18 NOTE — Telephone Encounter (Addendum)
Form reviewed- it is the same form I completed and faxed back to [facility] for him.

## 2020-08-23 ENCOUNTER — Telehealth: Payer: Self-pay

## 2020-08-23 NOTE — Telephone Encounter (Signed)
PA approved. Case ID number [id number]

## 2020-08-23 NOTE — Telephone Encounter (Signed)
PA initiated via Covermymeds; KEY: [id number]. Awaiting determination.

## 2020-09-03 ENCOUNTER — Ambulatory Visit (HOSPITAL_COMMUNITY)
Admission: RE | Admit: 2020-09-03 | Discharge: 2020-09-03 | Disposition: A | Discharge status: Home / Self Care | Payer: Medicare Other | Source: Ambulatory Visit | Attending: Vascular Surgery | Admitting: Vascular Surgery

## 2020-09-03 ENCOUNTER — Ambulatory Visit (INDEPENDENT_AMBULATORY_CARE_PROVIDER_SITE_OTHER): Payer: Medicare Other | Admitting: Vascular Surgery

## 2020-09-03 ENCOUNTER — Other Ambulatory Visit: Payer: Self-pay

## 2020-09-03 ENCOUNTER — Encounter: Payer: Self-pay | Admitting: Vascular Surgery

## 2020-09-03 ENCOUNTER — Ambulatory Visit (INDEPENDENT_AMBULATORY_CARE_PROVIDER_SITE_OTHER)
Admission: RE | Admit: 2020-09-03 | Discharge: 2020-09-03 | Disposition: A | Discharge status: Home / Self Care | Payer: Medicare Other | Source: Ambulatory Visit | Attending: Physician Assistant | Admitting: Physician Assistant

## 2020-09-03 VITALS — BP 148/64 | HR 54 | Temp 98.7°F | Resp 20 | Ht 65.0 in | Wt 131.0 lb

## 2020-09-03 DIAGNOSIS — I739 Peripheral vascular disease, unspecified: Secondary | ICD-10-CM | POA: Insufficient documentation

## 2020-09-03 DIAGNOSIS — Z87891 Personal history of nicotine dependence: Secondary | ICD-10-CM

## 2020-09-03 NOTE — Progress Notes (Signed)
Patient ID: Steven [name], male   DOB: [birth year], 75 y.o.   MRN: [id number]  Reason for Consult: No chief complaint on file.   Referred by Colon Branch, MD  Subjective:     HPI:  Steven [name] is a 75 y.o. male from [place] who has a previous history of a right femoral-to-popliteal artery bypass with vein. That was done for severe claudication.  He was previously seen with similar symptoms on the left with plan for angiography for occasional rest pain.  He works as a Tax inspector he does have relatively short distance claudication but states that if he walks slow enough he does not have any issues.  He does not have tissue loss or ulceration. He has never had stroke TIA or amaurosis. He is doing well from right lower extremity standpoint. He takes aspirin and statin daily.  Past Medical History:  Diagnosis Date  . Allergy   . Arthritis    Right Hip  . Atrial fibrillation ([place])   . BPH (benign prostatic hyperplasia)   . COPD (chronic obstructive pulmonary disease) ([place])   . Coronary artery disease    Lexiscan Myoview 7/14:  Inf scar with minimal reversibility, inf HK, EF 45%;  Low risk.  Promus LAD stent placed in [place].  100% RCA  . Diabetes 1.5, managed as type 1 ([place])   . Diverticulosis    on CT  . Dyslipidemia   . GERD (gastroesophageal reflux disease)    has used Zantac in the past   . Glaucoma   . Hammer toes, bilateral   . Hernia   . Hyperlipidemia   . Hypertension   . Peripheral vascular disease ([place])    a. s/p R fem-pop BPG 01/2013 (Dr. [name])  . 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 Diabetes Mother   . Hypertension Mother   . Heart disease Mother   . Hyperlipidemia Mother   . Diabetes Father   . Heart disease Father   . Hyperlipidemia Father   . Hypertension Father   . Diabetes Sister   . Heart disease Sister   . Hyperlipidemia Sister   .  Hypertension Sister   . Diabetes Brother   . Heart disease Brother   . Hyperlipidemia Brother   . Hypertension Brother   . Diabetes Son   . Heart disease Son   . Hyperlipidemia Son   . Hypertension Son   . Pancreatic cancer Son   . Alcohol abuse Neg Hx   . Cancer Neg Hx   . Depression Neg Hx   . Early death Neg Hx   . Kidney disease Neg Hx   . Stroke Neg Hx   . Colon cancer Neg Hx   . Esophageal cancer Neg Hx   . Rectal cancer Neg Hx   . Stomach cancer Neg Hx   . Liver cancer Neg Hx    Past Surgical History:  Procedure Laterality Date  . ABDOMINAL AORTAGRAM N/A 12/10/2012   Procedure: ABDOMINAL Maxcine Ham;  Surgeon: Serafina Mitchell, MD;  Location[facility] C[facility] LAB;  Service: Cardiovascular;  Laterality: N/A;  . CARDIAC CATHETERIZATION     with stent placement  . FEMORAL-POPLITEAL BYPASS GRAFT Right 02/07/2013   Procedure: BYPASS GRAFT FEMORAL-POPLITEAL ARTERY- RIGHT;  Surgeon: Mal Misty, MD;  Location: [place];  Service: Vascular;  Laterality: Right;  Right femoral to  below knee popliteal bypass using right non reversed great saphenous vein  . INGUINAL HERNIA REPAIR  2010   Right  . INGUINAL HERNIA REPAIR  09/11/2018  . INTRAOPERATIVE ARTERIOGRAM Right 02/07/2013   Procedure: Attempted INTRA OPERATIVE ARTERIOGRAM;  Surgeon: Mal Misty, MD;  Location: [facility];  Service: Vascular;  Laterality: Right;  . IR RETROGRADE PYELOGRAM  12/10/2019  . TRANSURETHRAL RESECTION OF PROSTATE  12/10/2019    Short Social History:  Social History   Tobacco Use  . Smoking status: Former Smoker    Packs/day: 0.30    Years: 50.00    Pack years: 15.00    Types: Cigarettes    Quit date: 02/04/2011    Years since quitting: 9.5  . Smokeless tobacco: Never Used  Substance Use Topics  . Alcohol use: Yes    Comment: wine daily    No Known Allergies  Current Outpatient Medications  Medication Sig Dispense Refill  . empagliflozin (JARDIANCE) 10 MG TABS tablet Take 1 tablet (10 mg total) by  mouth daily. 90 tablet 0  . amLODipine (NORVASC) 10 MG tablet Take 1 tablet (10 mg total) by mouth daily. 90 tablet 1  . aspirin EC 81 MG tablet Take 81 mg by mouth daily.    . blood glucose meter kit and supplies Dispense based on patient and insurance preference. Check blood sugars twice daily. Dx: E11.9 1 each 0  . Blood Glucose Monitoring Suppl (ONETOUCH VERIO IQ SYSTEM) w/Device KIT Use to check blood sugar 2 times per day. 1 kit 2  . cetirizine (ZYRTEC) 10 MG tablet Take 10 mg by mouth daily.    . famotidine (PEPCID) 20 MG tablet Take 1 tablet (20 mg total) by mouth 2 (two) times daily. 60 tablet 1  . fluticasone (FLONASE) 50 MCG/ACT nasal spray Place 2 sprays into both nostrils daily. 16 g 3  . glucose blood (ONETOUCH VERIO) test strip Use to check blood sugar 2 times per day 200 each 4  . insulin glargine, 1 Unit Dial, (TOUJEO SOLOSTAR) 300 UNIT/ML Solostar Pen Inject 15 Units into the skin daily.    . Insulin Pen Needle (BD PEN NEEDLE NANO U/F) 32G X 4 MM MISC use as directed with INSULIN 100 each 4  . metoprolol succinate (TOPROL-XL) 100 MG 24 hr tablet Take 1.5 tablets (150 mg total) by mouth daily. 135 tablet 1  . mometasone (NASONEX) 50 MCG/ACT nasal spray PLACE 2 SPRAYS INTO THE NOSE DAILY 17 g 3  . ondansetron (ZOFRAN ODT) 4 MG disintegrating tablet Take 1 tablet (4 mg total) by mouth every 8 (eight) hours as needed for nausea or vomiting. 20 tablet 0  . ONETOUCH DELICA LANCETS 88E MISC Use to check blood sugar 2 times per day. 200 each 2  . pravastatin (PRAVACHOL) 80 MG tablet Take 1 tablet (80 mg total) by mouth every evening. 90 tablet 3  . sucralfate (CARAFATE) 1 g tablet Take 1 tablet (1 g total) by mouth 4 (four) times daily -  with meals and at bedtime. 120 tablet 0  . tamsulosin (FLOMAX) 0.4 MG CAPS capsule TAKE 1 CAPSULE(0.4 MG) BY MOUTH DAILY (Patient taking differently: Take 0.4 mg by mouth daily.) 90 capsule 3   No current facility-administered medications for this  visit.    Review of Systems  Constitutional:  Constitutional negative. HENT: HENT negative.  Eyes: Eyes negative.  Cardiovascular: Positive for claudication.  GI: Gastrointestinal negative.  Musculoskeletal: Positive for leg pain.  Skin: Skin negative.  Neurological: Neurological  negative. Hematologic: Hematologic/lymphatic negative.  Psychiatric: Psychiatric negative.        Objective:  Objective  Vitals:   09/03/20 1343  BP: (!) 148/64  Pulse: (!) 54  Resp: 20  Temp: 98.7 F (37.1 C)  SpO2: 98%      Physical Exam HENT:     Head: Normocephalic.     Nose:     Comments: Wearing a mask Eyes:     Pupils: Pupils are equal, round, and reactive to light.  Cardiovascular:     Rate and Rhythm: Normal rate.     Pulses:          Popliteal pulses are 2+ on the right side and 0 on the left side.       Dorsalis pedis pulses are 0 on the left side.       Posterior tibial pulses are 1+ on the right side and 0 on the left side.  Pulmonary:     Effort: Pulmonary effort is normal.  Abdominal:     General: Abdomen is flat.     Palpations: Abdomen is soft. There is no mass.  Skin:    General: Skin is warm and dry.  Neurological:     General: No focal deficit present.     Mental Status: He is alert.  Psychiatric:        Mood and Affect: Mood normal.        Behavior: Behavior normal.        Thought Content: Thought content normal.        Judgment: Judgment normal.     Data: ABI Findings:  +---------+------------------+-----+--------+--------+  Right  Rt Pressure (mmHg)IndexWaveformComment   +---------+------------------+-----+--------+--------+  Brachial 146                     +---------+------------------+-----+--------+--------+  ATA   118        0.81 biphasic      +---------+------------------+-----+--------+--------+  PTA   137        0.94 biphasic       +---------+------------------+-----+--------+--------+  Great Toe62        0.42 Abnormal      +---------+------------------+-----+--------+--------+   +---------+------------------+-----+----------+-------+  Left   Lt Pressure (mmHg)IndexWaveform Comment  +---------+------------------+-----+----------+-------+  Brachial 134                      +---------+------------------+-----+----------+-------+  ATA   67        0.46 monophasic      +---------+------------------+-----+----------+-------+  PTA   71        0.49 monophasic      +---------+------------------+-----+----------+-------+  Great Toe22        0.15 Abnormal       +---------+------------------+-----+----------+-------+   +-------+-----------+-----------+------------+------------+  ABI/TBIToday's ABIToday's TBIPrevious ABIPrevious TBI  +-------+-----------+-----------+------------+------------+  Right 0.94    0.42    1.06    0.39      +-------+-----------+-----------+------------+------------+  Left  0.49    0.15    0.57    0        +-------+-----------+-----------+------------+------------+      Assessment/Plan:     75 year old male with left lower claudication symptoms previously did appear to have rest pain but this has resolved.  Right lower extremity is doing well status post bypass.  We have discussed continued walking.  At this time he can walk far enough has not really life limiting despite his ABI of 0.49 and as such we will not pursue any angiography.  He will follow-up  in 1 year with repeat ABIs unless his symptoms worsen we could consider angiography prior.    Waynetta Sandy MD [facility]

## 2020-09-06 ENCOUNTER — Ambulatory Visit: Payer: Medicare Other | Admitting: Internal Medicine

## 2020-09-06 ENCOUNTER — Other Ambulatory Visit: Payer: Self-pay

## 2020-09-06 VITALS — BP 153/70 | HR 52 | Temp 98.4°F | Ht 65.0 in | Wt 134.0 lb

## 2020-09-06 DIAGNOSIS — I10 Essential (primary) hypertension: Secondary | ICD-10-CM | POA: Diagnosis not present

## 2020-09-06 DIAGNOSIS — I70219 Atherosclerosis of native arteries of extremities with intermittent claudication, unspecified extremity: Secondary | ICD-10-CM | POA: Diagnosis not present

## 2020-09-06 DIAGNOSIS — Z794 Long term (current) use of insulin: Secondary | ICD-10-CM | POA: Diagnosis not present

## 2020-09-06 DIAGNOSIS — E1159 Type 2 diabetes mellitus with other circulatory complications: Secondary | ICD-10-CM

## 2020-09-06 NOTE — Patient Instructions (Signed)
Check the  blood pressure twice weekly. BP GOAL is between 110/65 and  135/85. If it is consistently higher or lower, let me know

## 2020-09-06 NOTE — Progress Notes (Signed)
Subjective:    Patient ID: Steven [name], male    DOB: 1946/05/15, 75 y.o.   MRN: [id number]  DOS:  09/06/2020 Type of visit - description: Follow-up Since the last office visit is doing well. Still have intermittent claudication with excessive walking but no pain at rest. Denies any numbness or tingling of the lower extremities  BP Readings from Last 3 Encounters:  09/06/20 (!) 153/70  09/03/20 (!) 148/64  07/29/20 120/60    Review of Systems See above   Past Medical History:  Diagnosis Date  . Allergy   . Arthritis    Right Hip  . Atrial fibrillation ([place])   . BPH (benign prostatic hyperplasia)   . COPD (chronic obstructive pulmonary disease) ([place])   . Coronary artery disease    Lexiscan Myoview 7/14:  Inf scar with minimal reversibility, inf HK, EF 45%;  Low risk.  Promus LAD stent placed in [place].  100% RCA  . Diabetes 1.5, managed as type 1 ([place])   . Diverticulosis    on CT  . Dyslipidemia   . GERD (gastroesophageal reflux disease)    has used Zantac in the past   . Glaucoma   . Hammer toes, bilateral   . Hernia   . Hyperlipidemia   . Hypertension   . Peripheral vascular disease ([place])    a. s/p R fem-pop BPG 01/2013 (Dr. [name])  . 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 ABDOMINAL AORTAGRAM N/A 12/10/2012   Procedure: ABDOMINAL Maxcine Ham;  Surgeon: Serafina Mitchell, MD;  Location: [facility] C[facility] LAB;  Service: Cardiovascular;  Laterality: N/A;  . CARDIAC CATHETERIZATION     with stent placement  . FEMORAL-POPLITEAL BYPASS GRAFT Right 02/07/2013   Procedure: BYPASS GRAFT FEMORAL-POPLITEAL ARTERY- RIGHT;  Surgeon: Mal Misty, MD;  Location: [facility] OR;  Service: Vascular;  Laterality: Right;  Right femoral to below knee popliteal bypass using right non reversed great saphenous vein  . INGUINAL HERNIA REPAIR  2010   Right  . INGUINAL HERNIA REPAIR  09/11/2018  .  INTRAOPERATIVE ARTERIOGRAM Right 02/07/2013   Procedure: Attempted INTRA OPERATIVE ARTERIOGRAM;  Surgeon: Mal Misty, MD;  Location: [place];  Service: Vascular;  Laterality: Right;  . IR RETROGRADE PYELOGRAM  12/10/2019  . TRANSURETHRAL RESECTION OF PROSTATE  12/10/2019    Allergies as of 09/06/2020   No Known Allergies     Medication List       Accurate as of September 06, 2020  3:53 PM. If you have any questions, ask your nurse or doctor.        amLODipine 10 MG tablet Commonly known as: NORVASC Take 1 tablet (10 mg total) by mouth daily.   aspirin EC 81 MG tablet Take 81 mg by mouth daily.   BD Pen Needle Nano U/F 32G X 4 MM Misc Generic drug: Insulin Pen Needle use as directed with INSULIN   blood glucose meter kit and supplies Dispense based on patient and insurance preference. Check blood sugars twice daily. Dx: E11.9   cetirizine 10 MG tablet Commonly known as: ZYRTEC Take 10 mg by mouth daily.   empagliflozin 10 MG Tabs tablet Commonly known as: Jardiance Take 1 tablet (10 mg total) by mouth daily.   famotidine 20 MG tablet Commonly known as: Pepcid Take 1 tablet (20 mg total) by mouth 2 (two) times daily.   fluticasone 50  MCG/ACT nasal spray Commonly known as: FLONASE Place 2 sprays into both nostrils daily.   glucose blood test strip Commonly known as: OneTouch Verio Use to check blood sugar 2 times per day   latanoprost 0.005 % ophthalmic solution Commonly known as: XALATAN SMARTSIG:In Eye(s)   metoprolol succinate 100 MG 24 hr tablet Commonly known as: TOPROL-XL Take 1.5 tablets (150 mg total) by mouth daily.   mometasone 50 MCG/ACT nasal spray Commonly known as: NASONEX PLACE 2 SPRAYS INTO THE NOSE DAILY   ondansetron 4 MG disintegrating tablet Commonly known as: Zofran ODT Take 1 tablet (4 mg total) by mouth every 8 (eight) hours as needed for nausea or vomiting.   OneTouch Delica Lancets 31V Misc Use to check blood sugar 2 times per  day.   OneTouch Verio IQ System w/Device Kit Use to check blood sugar 2 times per day.   pravastatin 80 MG tablet Commonly known as: PRAVACHOL Take 1 tablet (80 mg total) by mouth every evening.   sucralfate 1 g tablet Commonly known as: CARAFATE Take 1 tablet (1 g total) by mouth 4 (four) times daily -  with meals and at bedtime.   tamsulosin 0.4 MG Caps capsule Commonly known as: FLOMAX TAKE 1 CAPSULE(0.4 MG) BY MOUTH DAILY What changed: See the new instructions.   Toujeo SoloStar 300 UNIT/ML Solostar Pen Generic drug: insulin glargine (1 Unit Dial) Inject 15 Units into the skin daily.          Objective:   Physical Exam BP (!) 153/70 (BP Location: Right Arm, Patient Position: Sitting, Cuff Size: Large)   Pulse (!) 52   Temp 98.4 F (36.9 C) (Oral)   Ht '5\' 5"'  (1.651 m)   Wt 134 lb (60.8 kg)   SpO2 98%   BMI 22.30 kg/m  General:   Well developed, NAD, BMI noted. HEENT:  Normocephalic . Face symmetric, atraumatic DM foot exam: No edema, diminished pulses, pinprick examination normal, no deformities or major calluses. Skin: Not pale. Not jaundice Neurologic:  alert & oriented X3.  Speech normal, gait appropriate for age and unassisted Psych--  Cognition and judgment appear intact.  Cooperative with normal attention span and concentration.  Behavior appropriate. No anxious or depressed appearing.      Assessment   Assessment (new pt to me 12/2019) DM HTN Hyperlipidemia CRI: Creatinine fluctuates 1.70- 1.40 CV: --CAD, Dr. Percival Spanish --PAD  Sickle cell anemia COPD BPH, TURP and bilateral retrograde pyelograms 12/10/2019   PLAN DM, no neuropathy but + peripheral vascular disease with intermittent claudication, paperwork for diabetic foot shoes completed. PVD: Last visit with vascular surgery 09/03/2020, right lower extremity doing well after vascular bypass. Despite intermittent claudication, he was doing ok w/  ADLs thus  angiography was not pursued, next  visit in 1 year HTN: Currently on Metoprolol, amlodipine 10 mg, BP upon arrival 153/70, recheck: 138/72. Last creatinine improved to 1.49. No change for now, recommend ambulatory BPs.  At some point we could consider reintroduce ARBs     This visit occurred during the SARS-CoV-2 public health emergency.  Safety protocols were in place, including screening questions prior to the visit, additional usage of staff PPE, and extensive cleaning of exam room while observing appropriate contact time as indicated for disinfecting solutions.

## 2020-09-07 ENCOUNTER — Encounter: Payer: Self-pay | Admitting: Internal Medicine

## 2020-09-07 ENCOUNTER — Ambulatory Visit: Payer: Medicare Other | Admitting: Internal Medicine

## 2020-09-07 ENCOUNTER — Other Ambulatory Visit: Payer: Self-pay

## 2020-09-07 VITALS — BP 144/74 | HR 58 | Ht 65.0 in | Wt 132.4 lb

## 2020-09-07 DIAGNOSIS — E1165 Type 2 diabetes mellitus with hyperglycemia: Secondary | ICD-10-CM | POA: Diagnosis not present

## 2020-09-07 DIAGNOSIS — Z794 Long term (current) use of insulin: Secondary | ICD-10-CM | POA: Diagnosis not present

## 2020-09-07 DIAGNOSIS — E785 Hyperlipidemia, unspecified: Secondary | ICD-10-CM

## 2020-09-07 DIAGNOSIS — N1831 Chronic kidney disease, stage 3a: Secondary | ICD-10-CM

## 2020-09-07 DIAGNOSIS — E1142 Type 2 diabetes mellitus with diabetic polyneuropathy: Secondary | ICD-10-CM | POA: Diagnosis not present

## 2020-09-07 DIAGNOSIS — E1122 Type 2 diabetes mellitus with diabetic chronic kidney disease: Secondary | ICD-10-CM

## 2020-09-07 DIAGNOSIS — E1159 Type 2 diabetes mellitus with other circulatory complications: Secondary | ICD-10-CM

## 2020-09-07 LAB — POCT GLYCOSYLATED HEMOGLOBIN (HGB A1C): Hemoglobin A1C: 8.1 % — AB (ref 4.0–5.6)

## 2020-09-07 LAB — POCT GLUCOSE (DEVICE FOR HOME USE): POC Glucose: 212 mg/dl — AB (ref 70–99)

## 2020-09-07 MED ORDER — METFORMIN HCL ER 500 MG PO TB24: 500.0000 mg | ORAL_TABLET | Freq: Two times a day (BID) | ORAL | 3 refills | Status: DC

## 2020-09-07 MED ORDER — EMPAGLIFLOZIN 25 MG PO TABS: 25.0000 mg | ORAL_TABLET | Freq: Every day | ORAL | 6 refills | Status: DC

## 2020-09-07 NOTE — Patient Instructions (Signed)
-   Start Metformin 500 mg XR, 1 tablet with Breakfast and 1 tablet with Supper - Increase Jardiance to 25 mg, 1 tablet with Breakfast  - Continue Toujeo 15 units daily       HOW TO TREAT LOW BLOOD SUGARS (Blood sugar LESS THAN 70 MG/DL)  Please follow the RULE OF 15 for the treatment of hypoglycemia treatment (when your (blood sugars are less than 70 mg/dL)    STEP 1: Take 15 grams of carbohydrates when your blood sugar is low, which includes:   3-4 GLUCOSE TABS  OR  3-4 OZ OF JUICE OR REGULAR SODA OR  ONE TUBE OF GLUCOSE GEL     STEP 2: RECHECK blood sugar in 15 MINUTES STEP 3: If your blood sugar is still low at the 15 minute recheck --> then, go back to STEP 1 and treat AGAIN with another 15 grams of carbohydrates.

## 2020-09-07 NOTE — Assessment & Plan Note (Addendum)
DM, no neuropathy but + peripheral vascular disease with intermittent claudication, paperwork for diabetic foot shoes completed. PVD: Last visit with vascular surgery 09/03/2020, right lower extremity doing well after vascular bypass. Despite intermittent claudication, he was doing ok w/  ADLs thus  angiography was not pursued, next visit in 1 year HTN: Currently on Metoprolol, amlodipine 10 mg, BP upon arrival 153/70, recheck: 138/72. Last creatinine improved to 1.49. No change for now, recommend ambulatory BPs.  At some point we could consider reintroduce ARBs

## 2020-09-07 NOTE — Progress Notes (Signed)
Name: SPYROS WINCH  MRN/ DOB: [id number], 06-07-1946   Age/ Sex: 75 y.o., male    PCP: Colon Branch, MD   Reason for Endocrinology Evaluation: Type 2 Diabetes Mellitus     Date of Initial Endocrinology Visit: 09/07/2020     PATIENT IDENTIFIER: Mr. RAHSHAWN REMO is a 74 y.o. male with a past medical history of T2DM, A.Fibrillation, COPD CAD and Dyslipidemia. The patient presented for initial endocrinology clinic visit on 09/07/2020 for consultative assistance with his diabetes management.    HPI: Mr. Theiss was    Diagnosed with DM in 1977 Prior Medications tried/Intolerance: Has been on Metformin- no intolerance but had to be stopped while in hospital. Has been on insulin since 2014 Currently checking blood sugars 1 x / day Hypoglycemia episodes : no          Hemoglobin A1c has ranged from 7.0% in 2021, peaking at 8.4% in 2016. Patient required assistance for hypoglycemia:  Patient has required hospitalization within the last 1 year from hyper or hypoglycemia: no  In terms of diet, the patient eats 3 meals a day, snacks occasionally   HOME DIABETES REGIMEN: Jardiance 10 mg daily  Toujeo 15 units daily    Statin: yes ACE-I/ARB: no   METER DOWNLOAD SUMMARY: Did not bring   DIABETIC COMPLICATIONS: Microvascular complications:   Neuropathy, CKD III  Denies: retinopathy   Last eye exam: Completed 2021  Macrovascular complications:   CAD  Denies:  PVD, CVA   PAST HISTORY: Past Medical History:  Past Medical History:  Diagnosis Date  . Allergy   . Arthritis    Right Hip  . Atrial fibrillation ([place])   . BPH (benign prostatic hyperplasia)   . COPD (chronic obstructive pulmonary disease) ([place])   . Coronary artery disease    Lexiscan Myoview 7/14:  Inf scar with minimal reversibility, inf HK, EF 45%;  Low risk.  Promus LAD stent placed in [place].  100% RCA  . Diabetes 1.5, managed as type 1 ([place])   . Diverticulosis    on CT  . Dyslipidemia   . GERD  (gastroesophageal reflux disease)    has used Zantac in the past   . Glaucoma   . Hammer toes, bilateral   . Hernia   . Hyperlipidemia   . Hypertension   . Peripheral vascular disease ([place])    a. s/p R fem-pop BPG 01/2013 (Dr. [name])  . 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   Past Surgical History:  Past Surgical History:  Procedure Laterality Date  . ABDOMINAL AORTAGRAM N/A 12/10/2012   Procedure: ABDOMINAL Maxcine Ham;  Surgeon: Serafina Mitchell, MD;  Location: [facility] CATH LAB;  Service: Cardiovascular;  Laterality: N/A;  . CARDIAC CATHETERIZATION     with stent placement  . FEMORAL-POPLITEAL BYPASS GRAFT Right 02/07/2013   Procedure: BYPASS GRAFT FEMORAL-POPLITEAL ARTERY- RIGHT;  Surgeon: Mal Misty, MD;  [facility];  Service: Vascular;  Laterality: Right;  Right femoral to below knee popliteal bypass using right non reversed great saphenous vein  . INGUINAL HERNIA REPAIR  2010   Right  . INGUINAL HERNIA REPAIR  09/11/2018  . INTRAOPERATIVE ARTERIOGRAM Right 02/07/2013   Procedure: Attempted INTRA OPERATIVE ARTERIOGRAM;  Surgeon: Mal Misty, MD;  Location: [place];  Service: Vascular;  Laterality: Right;  . IR RETROGRADE PYELOGRAM  12/10/2019  . TRANSURETHRAL RESECTION OF PROSTATE  12/10/2019      Social History:  reports  that he quit smoking about 9 years ago. His smoking use included cigarettes. He has a 15.00 pack-year smoking history. He has never used smokeless tobacco. He reports current alcohol use. He reports that he does not use drugs. Family History:  Family History  Problem Relation Age of Onset  . Diabetes Mother   . Hypertension Mother   . Heart disease Mother   . Hyperlipidemia Mother   . Diabetes Father   . Heart disease Father   . Hyperlipidemia Father   . Hypertension Father   . Diabetes Sister   . Heart disease Sister   . Hyperlipidemia Sister   . Hypertension Sister   . Diabetes Brother   . Heart disease  Brother   . Hyperlipidemia Brother   . Hypertension Brother   . Diabetes Son   . Heart disease Son   . Hyperlipidemia Son   . Hypertension Son   . Pancreatic cancer Son   . Alcohol abuse Neg Hx   . Cancer Neg Hx   . Depression Neg Hx   . Early death Neg Hx   . Kidney disease Neg Hx   . Stroke Neg Hx   . Colon cancer Neg Hx   . Esophageal cancer Neg Hx   . Rectal cancer Neg Hx   . Stomach cancer Neg Hx   . Liver cancer Neg Hx      HOME MEDICATIONS: Allergies as of 09/07/2020   No Known Allergies     Medication List       Accurate as of September 07, 2020  8:06 AM. If you have any questions, ask your nurse or doctor.        amLODipine 10 MG tablet Commonly known as: NORVASC Take 1 tablet (10 mg total) by mouth daily.   aspirin EC 81 MG tablet Take 81 mg by mouth daily.   BD Pen Needle Nano U/F 32G X 4 MM Misc Generic drug: Insulin Pen Needle use as directed with INSULIN   blood glucose meter kit and supplies Dispense based on patient and insurance preference. Check blood sugars twice daily. Dx: E11.9   empagliflozin 10 MG Tabs tablet Commonly known as: Jardiance Take 1 tablet (10 mg total) by mouth daily.   fluticasone 50 MCG/ACT nasal spray Commonly known as: FLONASE Place 2 sprays into both nostrils daily.   glucose blood test strip Commonly known as: OneTouch Verio Use to check blood sugar 2 times per day   latanoprost 0.005 % ophthalmic solution Commonly known as: XALATAN SMARTSIG:In Eye(s)   metoprolol succinate 100 MG 24 hr tablet Commonly known as: TOPROL-XL Take 1.5 tablets (150 mg total) by mouth daily.   OneTouch Delica Lancets 46T Misc Use to check blood sugar 2 times per day.   OneTouch Verio IQ System w/Device Kit Use to check blood sugar 2 times per day.   pravastatin 80 MG tablet Commonly known as: PRAVACHOL Take 1 tablet (80 mg total) by mouth every evening.   sucralfate 1 g tablet Commonly known as: CARAFATE Take 1 tablet (1  g total) by mouth 4 (four) times daily -  with meals and at bedtime.   tamsulosin 0.4 MG Caps capsule Commonly known as: FLOMAX TAKE 1 CAPSULE(0.4 MG) BY MOUTH DAILY What changed: See the new instructions.   Toujeo SoloStar 300 UNIT/ML Solostar Pen Generic drug: insulin glargine (1 Unit Dial) Inject 15 Units into the skin daily.        ALLERGIES: No Known Allergies   REVIEW OF  SYSTEMS: A comprehensive ROS was conducted with the patient and is negative except as per HPI    OBJECTIVE:   VITAL SIGNS: BP (!) 144/74   Pulse (!) 58   Ht '5\' 5"'  (1.651 m)   Wt 132 lb 6 oz (60 kg)   SpO2 98%   BMI 22.03 kg/m    PHYSICAL EXAM:  General: Pt appears well and is in NAD  Neck: General: Supple without adenopathy or carotid bruits. Thyroid: Thyroid size normal.  No goiter or nodules appreciated.  Lungs: Clear with good BS bilat with no rales, rhonchi, or wheezes  Heart: RRR with normal S1 and S2 and no gallops; no murmurs; no rub  Abdomen: Normoactive bowel sounds, soft, nontender, without masses or organomegaly palpable  Extremities:  Lower extremities - No pretibial edema. No lesions.  Skin: Normal texture and temperature to palpation. No rash noted. No Acanthosis nigricans/skin tags. No lipohypertrophy.  Neuro: MS is good with appropriate affect, pt is alert and Ox3    DM foot exam: 09/07/2020  The skin of the feet is intact without sores or ulcerations. The pedal pulses are absent The sensation is intact to a screening 5.07, 10 gram monofilament bilaterally   DATA REVIEWED:  Lab Results  Component Value Date   HGBA1C 7.0 (H) 06/07/2020   HGBA1C 7.3 (H) 01/09/2020   HGBA1C 7.3 (H) 04/15/2018   Lab Results  Component Value Date   MICROALBUR 74.0 (H) 05/24/2017   LDLCALC 94 06/22/2020   CREATININE 1.49 07/29/2020   Lab Results  Component Value Date   MICRALBCREAT 51.6 (H) 05/24/2017    Lab Results  Component Value Date   CHOL 173 06/22/2020   HDL 57.60  06/22/2020   LDLCALC 94 06/22/2020   TRIG 111.0 06/22/2020   CHOLHDL 3 06/22/2020        ASSESSMENT / PLAN / RECOMMENDATIONS:   1) Type 2 Diabetes Mellitus, Poorly controlled, With Neuropathic, CKD III and macrovascular complications - Most recent A1c of 8.1 %. Goal A1c < 7.0 %.    Plan: GENERAL: We stressed the importance of lifestyle changes including diet and exercise. I explained the complications associated with diabetes including retinopathy, nephropathy, neuropathy as well as increased risk of cardiovascular disease. We went over the benefit seen with glycemic control.    I explained to the patient that diabetic patients are at higher than normal risk for amputations  Will refer to our RD for CGM training   He presented to the hospital in 05/2020 with N/V with Acute renal insufficiency and metformin was stopped at the time, recent GFR > 35 at 46 , will restart Metformin at 50 % dose. I am also going to increase jardiance and monitor renal function on next visit   I would avoid considering GLP-1 agonists due to elevated lipase and LFT's . Pt with hx of ETOH use, pt advised to avoid use due to risk of hyperglycemia and hypoglycemia with insulin and ETOH intake.   MEDICATIONS: - Start Metformin 500 mg XR, 1 tablet with Breakfast and 1 tablet with Supper - Increase Jardiance to 25 mg, 1 tablet with Breakfast  - Continue Toujeo 15 units daily    EDUCATION / INSTRUCTIONS:  BG monitoring instructions: Patient is instructed to check his blood sugars 1 times a day, fasting .  Call [place] [facility] if: BG persistently < 70  . I reviewed the Rule of 15 for the treatment of hypoglycemia in detail with the patient. Literature supplied.  2) Diabetic complications:   Eye: Does not have known diabetic retinopathy.   Neuro/ Feet: Does  have known diabetic peripheral neuropathy.  Renal: Patient does  have known baseline CKD. He is not on an ACEI/ARB at  present.  3)Dyslipidemia:  LDL at goal. Pt on Pravastatin  . No intolerance issues. Had elevated LFT's in 05/2020 and was held but per pt he is back on it.    F/U in 3 months    Signed electronically by: Mack Guise, MD  [facility]  [facility] [address] [place], [place] [address] Phone: [phone] FAX: [phone]   CC: Colon Branch, [address] [place] [address] Phone: [phone]  Fax: [phone]    Return to Endocrinology clinic as below: Future Appointments  Date Time Provider [place]  09/08/2020  4:00 PM Colon Branch, MD [facility]  09/27/2020  3:40 PM Colon Branch, MD [facility] [place]

## 2020-09-08 ENCOUNTER — Ambulatory Visit: Payer: Medicare Other | Admitting: Internal Medicine

## 2020-09-10 LAB — HEPATIC FUNCTION PANEL
ALT: 12 IU/L (ref 0–44)
AST: 24 IU/L (ref 0–40)
Albumin: 3.8 g/dL (ref 3.7–4.7)
Alkaline Phosphatase: 88 IU/L (ref 44–121)
Bilirubin Total: 0.3 mg/dL (ref 0.0–1.2)
Bilirubin, Direct: 0.11 mg/dL (ref 0.00–0.40)
Total Protein: 7.4 g/dL (ref 6.0–8.5)

## 2020-09-10 LAB — LIPID PANEL
Chol/HDL Ratio: 3.1 ratio (ref 0.0–5.0)
Cholesterol, Total: 140 mg/dL (ref 100–199)
HDL: 45 mg/dL (ref >39)
LDL Chol Calc (NIH): 81 mg/dL (ref 0–99)
Triglycerides: 67 mg/dL (ref 0–149)
VLDL Cholesterol Cal: 14 mg/dL (ref 5–40)

## 2020-09-21 ENCOUNTER — Other Ambulatory Visit: Payer: Self-pay | Admitting: Internal Medicine

## 2020-09-21 MED ORDER — EMPAGLIFLOZIN 25 MG PO TABS: 25.0000 mg | ORAL_TABLET | Freq: Every day | ORAL | 1 refills | Status: DC

## 2020-09-27 ENCOUNTER — Ambulatory Visit: Payer: Medicare Other | Admitting: Internal Medicine

## 2020-10-26 ENCOUNTER — Telehealth: Payer: Self-pay | Admitting: Internal Medicine

## 2020-10-26 MED ORDER — AMLODIPINE BESYLATE 10 MG PO TABS: 10.0000 mg | ORAL_TABLET | Freq: Every day | ORAL | 1 refills | Status: DC

## 2020-10-26 MED ORDER — METOPROLOL SUCCINATE ER 100 MG PO TB24: 150.0000 mg | ORAL_TABLET | Freq: Every day | ORAL | 1 refills | Status: DC

## 2020-10-26 NOTE — Telephone Encounter (Signed)
Patient called stating that medication is to high, patient would like another medication or  Cheaper vision of the medication sent to pharmacy.  empagliflozin (JARDIANCE) 25 MG TABS tablet [[id number]]   Please send new script to pharmacy below  [place], [place] Phone:  [phone]  Fax:  [phone]       Please advise

## 2020-10-26 NOTE — Telephone Encounter (Signed)
Spoken to patient and notified him if he don't mind to call the insurance to asked what they are willing to pay.

## 2020-10-26 NOTE — Telephone Encounter (Signed)
Rxs sent

## 2020-10-26 NOTE — Telephone Encounter (Signed)
Medication: amLODipine (NORVASC) 10 MG tablet [[id number]  metoprolol succinate (TOPROL-XL) 100 MG 24 hr tablet [[id number]]   Has the patient contacted their pharmacy? No. (If no, request that the patient contact the pharmacy for the refill.) (If yes, when and what did the pharmacy advise?)  Preferred Pharmacy (with phone number or street name): [place], [place]: Please be advised that RX refills may take up to 3 business days. We ask that you follow-up with your pharmacy.

## 2020-10-29 ENCOUNTER — Other Ambulatory Visit: Payer: Self-pay | Admitting: Internal Medicine

## 2020-11-11 ENCOUNTER — Encounter: Payer: Medicare Other | Attending: Internal Medicine | Admitting: Dietician

## 2020-11-11 ENCOUNTER — Other Ambulatory Visit: Payer: Self-pay

## 2020-11-11 ENCOUNTER — Encounter: Payer: Self-pay | Admitting: Dietician

## 2020-11-11 DIAGNOSIS — Z794 Long term (current) use of insulin: Secondary | ICD-10-CM | POA: Diagnosis present

## 2020-11-11 DIAGNOSIS — E1159 Type 2 diabetes mellitus with other circulatory complications: Secondary | ICD-10-CM | POA: Insufficient documentation

## 2020-11-11 NOTE — Patient Instructions (Addendum)
Plan:  Aim for 3-4 Carb Choices per meal (45-60 grams) +/- 1 either way  Aim for 0-1 Carbs per snack if hungry  Include protein in moderation with your meals and snacks Consider reading food labels for Total Carbohydrate of foods Consider  increasing your activity level by staying active for 30 minutes daily as tolerated Continue checking BG at alternate times per day  Continue taking medication as directed by MD  Scan Elenor Legato every 8 hours or more as desired.  When you wake, midday, before bed. Replace sensor every 10 days.  Obtain a Games developer for the [employer]. Please call for questions if you upgrade your phone or for other reasons.    Marland Kitchen

## 2020-11-11 NOTE — Progress Notes (Signed)
Diabetes Self-Management Education  Visit Type: First/Initial  Appt. Start Time: 1500 Appt. End Time: 1600  11/11/2020  [name], identified by name and date of birth, is a 75 y.o. male with a diagnosis of Diabetes: Type 2.   ASSESSMENT Patient is here today alone.  He states that he would like to learn everything. He brought his [facility] and would like to learn to put this on and use this. Referral to help patient with CGM.  History includes Type 2 diabetes (1977), COPD, CAD, dyslipidemia, .CKD, sickle cell trait Labs noted to include:  A1C 8.1% 09/07/2020, cholesterol 140, HDL 45, LDL 81, Triglycerides 09/10/2020, GFR 46 07/29/20 increased from 28 07/13/20 Medications include Metformin, Jardiance, Toujeo 15 units q HS.  Patient brought the [employer] from home.  He has never used this.  It had expired in 2021.  Provided him a sample.  He was able to apply the sensor.  Verbalized to avoid high doses of vitamin C. Instructed him to scan blood sugar every 8 hours or more frequently to learn about his blood sugar control. He has no prescription.  MD/MA messaged for script. Current phone is not compatible.  Patient is planning to upgrade.  List of compatible options provided.  Weight hx: 138 lbs 11/11/20 132 lbs 11/05/2020 145 lbs 05/2020 He reports that he lost a lot of weight when he had prostate surgery.  Patient lives with his wife.  She does the shopping and cooking.  Boils or bakes and patient states that she is quite strict with the amount of carbohydrates that she allows. 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 Does not tolerate dairy.  Height 5\' 5"  (1.651 m), weight 138 lb (62.6 kg). Body mass index is 22.96 kg/m.   Diabetes Self-Management Education - 11/11/20 1552      Visit Information   Visit Type First/Initial      Initial Visit   Diabetes Type Type 2    Are you currently following a meal plan? No    Are you taking your medications as  prescribed? Yes    Date Diagnosed [place]   How would you rate your overall health? Good      Psychosocial Assessment   Patient Belief/Attitude about Diabetes Motivated to manage diabetes    Self-care barriers None    Self-management support Doctor's office    Other persons present Patient    Patient Concerns Nutrition/Meal planning;Glycemic Control;Monitoring    Special Needs None    Preferred Learning Style No preference indicated    Learning Readiness Ready    How often do you need to have someone help you when you read instructions, pamphlets, or other written materials from your doctor or pharmacy? 1 - Never    What is the last grade level you completed in school? college      Pre-Education Assessment   Patient understands the diabetes disease and treatment process. Needs Review    Patient understands incorporating nutritional management into lifestyle. Needs Review    Patient undertands incorporating physical activity into lifestyle. Needs Review    Patient understands using medications safely. Needs Review    Patient understands monitoring blood glucose, interpreting and using results Needs Review    Patient understands prevention, detection, and treatment of acute complications. Needs Review    Patient understands prevention, detection, and treatment of chronic complications. Needs Review    Patient understands how to develop strategies to address psychosocial  issues. Needs Review    Patient understands how to develop strategies to promote health/change behavior. Needs Review      Complications   Last HgB A1C per patient/outside source 8.1 %   09/07/2020   How often do you check your blood sugar? 1-2 times/day    Fasting Blood glucose range (mg/dL) 70-129;130-179    Number of hypoglycemic episodes per month 0    Number of hyperglycemic episodes per week 2    Can you tell when your blood sugar is high? Yes    What do you do if your blood sugar is high? takes  medications    Have you had a dilated eye exam in the past 12 months? Yes    Have you had a dental exam in the past 12 months? Yes    Are you checking your feet? Yes    How many days per week are you checking your feet? 2      Dietary Intake   Breakfast boiled egg, 1 slice bread or grits, banana OR oatmeal    Lunch slimfast, beans or chicken, vegetables    Snack (afternoon) nuts, grapes    Dinner chicken, beans, vegetables OR rice, chicken, vegetables OR lasagne, garlic bread, salad    Snack (evening) nuts    Beverage(s) water, coffee with sugar soft, slimfast      Exercise   Exercise Type Light (walking / raking leaves)      Patient Education   Previous Diabetes Education Yes (please comment)   2014   Nutrition management  Food label reading, portion sizes and measuring food.;Role of diet in the treatment of diabetes and the relationship between the three main macronutrients and blood glucose level;Meal options for control of blood glucose level and chronic complications.    Physical activity and exercise  Role of exercise on diabetes management, blood pressure control and cardiac health.    Medications Reviewed patients medication for diabetes, action, purpose, timing of dose and side effects.    Monitoring Taught/discussed recording of test results and interpretation of SMBG.;Identified appropriate SMBG and/or A1C goals.;Daily foot exams;Yearly dilated eye exam    Acute complications Taught treatment of hypoglycemia - the 15 rule.    Chronic complications Relationship between chronic complications and blood glucose control    Psychosocial adjustment Role of stress on diabetes      Individualized Goals (developed by patient)   Nutrition General guidelines for healthy choices and portions discussed    Physical Activity Exercise 3-5 times per week;30 minutes per day    Medications take my medication as prescribed    Monitoring  test my blood glucose as discussed    Reducing Risk  increase portions of healthy fats;examine blood glucose patterns      Post-Education Assessment   Patient understands the diabetes disease and treatment process. Demonstrates understanding / competency    Patient understands incorporating nutritional management into lifestyle. Needs Review    Patient undertands incorporating physical activity into lifestyle. Demonstrates understanding / competency    Patient understands using medications safely. Demonstrates understanding / competency    Patient understands monitoring blood glucose, interpreting and using results Needs Review    Patient understands prevention, detection, and treatment of acute complications. Demonstrates understanding / competency    Patient understands prevention, detection, and treatment of chronic complications. Demonstrates understanding / competency    Patient understands how to develop strategies to address psychosocial issues. Demonstrates understanding / competency    Patient understands how to develop strategies to  promote health/change behavior. Demonstrates understanding / competency      Outcomes   Expected Outcomes Demonstrated interest in learning. Expect positive outcomes    Future DMSE 2 months    Program Status Not Completed           Individualized Plan for Diabetes Self-Management Training:   Learning Objective:  Patient will have a greater understanding of diabetes self-management. Patient education plan is to attend individual and/or group sessions per assessed needs and concerns.   Plan:   Patient Instructions  Plan:  Aim for 3-4 Carb Choices per meal (45-60 grams) +/- 1 either way  Aim for 0-1 Carbs per snack if hungry  Include protein in moderation with your meals and snacks Consider reading food labels for Total Carbohydrate of foods Consider  increasing your activity level by staying active for 30 minutes daily as tolerated Continue checking BG at alternate times per day  Continue taking  medication as directed by MD  Scan Elenor Legato every 8 hours or more as desired.  When you wake, midday, before bed. Replace sensor every 10 days.  Obtain a Games developer for the [employer]. Please call for questions if you upgrade your phone or for other reasons.    .   Expected Outcomes:  Demonstrated interest in learning. Expect positive outcomes  Education material provided: ADA - How to Thrive: A Guide for Your Journey with Diabetes and Meal plan card; list of compatible phones for use with the [facility] 2  If problems or questions, patient to contact team via:  Phone  Future DSME appointment: 2 months

## 2020-11-12 ENCOUNTER — Other Ambulatory Visit: Payer: Self-pay | Admitting: Internal Medicine

## 2020-11-12 MED ORDER — FREESTYLE LIBRE 2 SENSOR MISC: 1.0000 | 3 refills | Status: DC

## 2020-11-23 ENCOUNTER — Other Ambulatory Visit: Payer: Self-pay

## 2020-11-23 ENCOUNTER — Ambulatory Visit (INDEPENDENT_AMBULATORY_CARE_PROVIDER_SITE_OTHER): Payer: Medicare Other | Admitting: Internal Medicine

## 2020-11-23 VITALS — BP 157/71 | HR 54 | Temp 98.0°F | Ht 65.0 in | Wt 134.2 lb

## 2020-11-23 DIAGNOSIS — E785 Hyperlipidemia, unspecified: Secondary | ICD-10-CM | POA: Diagnosis not present

## 2020-11-23 DIAGNOSIS — I10 Essential (primary) hypertension: Secondary | ICD-10-CM | POA: Diagnosis not present

## 2020-11-23 DIAGNOSIS — N189 Chronic kidney disease, unspecified: Secondary | ICD-10-CM | POA: Diagnosis not present

## 2020-11-23 NOTE — Patient Instructions (Addendum)
Check the  blood pressure 2 or 3 times a week BP GOAL is between 110/65 and  135/85. If it is consistently higher or lower, let me know  Watch the salt intake closely  Please consider your COVID-vaccine #4  GO TO THE LAB : Get the blood work    Depending on your results, I might add another medication for blood pressure   GO TO THE FRONT DESK, [place] back for a physical exam in 4 months

## 2020-11-23 NOTE — Progress Notes (Signed)
Subjective:    Patient ID: Steven [name], male    DOB: [birth year], 75 y.o.   MRN: [id number]  DOS:  11/23/2020 Type of visit - description: Follow-up Since the last office visit he is doing well. Denies any problems or concerns Ambulatory BPs are rarely checked. Notes from endocrinology reviewed, available labs reviewed.   BP Readings from Last 3 Encounters:  11/23/20 (!) 157/71  09/07/20 (!) 144/74  09/06/20 (!) 153/70     Review of Systems Denies chest pain or difficulty breathing.  No edema  Past Medical History:  Diagnosis Date  . Allergy   . Arthritis    Right Hip  . Atrial fibrillation ([place])   . BPH (benign prostatic hyperplasia)   . COPD (chronic obstructive pulmonary disease) ([place])   . Coronary artery disease    Lexiscan Myoview 7/14:  Inf scar with minimal reversibility, inf HK, EF 45%;  Low risk.  Promus LAD stent placed in [place].  100% RCA  . Diabetes 1.5, managed as type 1 ([place])   . Diverticulosis    on CT  . Dyslipidemia   . GERD (gastroesophageal reflux disease)    has used Zantac in the past   . Glaucoma   . Hammer toes, bilateral   . Hernia   . Hyperlipidemia   . Hypertension   . Peripheral vascular disease ([place])    a. s/p R fem-pop BPG 01/2013 (Dr. [name])  . 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 ABDOMINAL AORTAGRAM N/A 12/10/2012   Procedure: ABDOMINAL Maxcine Ham;  Surgeon: Serafina Mitchell, MD;  Location: [facility] CATH LAB;  Service: Cardiovascular;  Laterality: N/A;  . CARDIAC CATHETERIZATION     with stent placement  . FEMORAL-POPLITEAL BYPASS GRAFT Right 02/07/2013   Procedure: BYPASS GRAFT FEMORAL-POPLITEAL ARTERY- RIGHT;  Surgeon: Mal Misty, MD;  [facility]: [facility] OR;  Service: Vascular;  Laterality: Right;  Right femoral to below knee popliteal bypass using right non reversed great saphenous vein  . INGUINAL HERNIA REPAIR  2010   Right  .  INGUINAL HERNIA REPAIR  09/11/2018  . INTRAOPERATIVE ARTERIOGRAM Right 02/07/2013   Procedure: Attempted INTRA OPERATIVE ARTERIOGRAM;  Surgeon: Mal Misty, MD;  Location: [place];  Service: Vascular;  Laterality: Right;  . IR RETROGRADE PYELOGRAM  12/10/2019  . TRANSURETHRAL RESECTION OF PROSTATE  12/10/2019    Allergies as of 11/23/2020   No Known Allergies     Medication List       Accurate as of Nov 23, 2020 11:59 PM. If you have any questions, ask your nurse or doctor.        amLODipine 10 MG tablet Commonly known as: NORVASC Take 1 tablet (10 mg total) by mouth daily.   aspirin EC 81 MG tablet Take 81 mg by mouth daily.   BD Pen Needle Nano U/F 32G X 4 MM Misc Generic drug: Insulin Pen Needle use as directed with INSULIN   blood glucose meter kit and supplies Dispense based on patient and insurance preference. Check blood sugars twice daily. Dx: E11.9   empagliflozin 25 MG Tabs tablet Commonly known as: Jardiance Take 1 tablet (25 mg total) by mouth daily before breakfast.   ferrous sulfate 325 (65 FE) MG tablet Take 325 mg by mouth daily with breakfast.   fluticasone 50 MCG/ACT nasal spray Commonly known as: FLONASE Place 2 sprays into both nostrils daily.  FreeStyle Libre 2 Sensor Misc 1 Device by Does not apply route as directed.   glucose blood test strip Commonly known as: OneTouch Verio Use to check blood sugar 2 times per day   latanoprost 0.005 % ophthalmic solution Commonly known as: XALATAN SMARTSIG:In Eye(s)   metFORMIN 500 MG 24 hr tablet Commonly known as: GLUCOPHAGE-XR Take 1 tablet (500 mg total) by mouth 2 (two) times daily.   metoprolol succinate 100 MG 24 hr tablet Commonly known as: TOPROL-XL Take 1.5 tablets (150 mg total) by mouth daily.   OneTouch Delica Lancets 29H Misc Use to check blood sugar 2 times per day.   OneTouch Verio IQ System w/Device Kit Use to check blood sugar 2 times per day.   Pfizer-BioNTech COVID-19 Vacc  30 MCG/0.3ML injection Generic drug: COVID-19 mRNA vaccine (Pfizer) INJECT AS DIRECTED   pravastatin 80 MG tablet Commonly known as: PRAVACHOL Take 1 tablet (80 mg total) by mouth every evening.   sucralfate 1 g tablet Commonly known as: CARAFATE Take 1 tablet (1 g total) by mouth 4 (four) times daily -  with meals and at bedtime.   tamsulosin 0.4 MG Caps capsule Commonly known as: FLOMAX TAKE 1 CAPSULE(0.4 MG) BY MOUTH DAILY   Toujeo SoloStar 300 UNIT/ML Solostar Pen Generic drug: insulin glargine (1 Unit Dial) Inject 15 Units into the skin daily.          Objective:   Physical Exam BP (!) 157/71 (BP Location: Right Arm, Patient Position: Sitting, Cuff Size: Large)   Pulse (!) 54   Temp 98 F (36.7 C) (Temporal)   Ht '5\' 5"'  (1.651 m)   Wt 134 lb 3.2 oz (60.9 kg)   SpO2 97%   BMI 22.33 kg/m  General:   Well developed, NAD, BMI noted. HEENT:  Normocephalic . Face symmetric, atraumatic Lungs:  CTA B Normal respiratory effort, no intercostal retractions, no accessory muscle use. Heart: RRR,  no murmur.  Lower extremities: no pretibial edema bilaterally  Skin: Not pale. Not jaundice Neurologic:  alert & oriented X3.  Speech normal, gait appropriate for age and unassisted Psych--  Cognition and judgment appear intact.  Cooperative with normal attention span and concentration.  Behavior appropriate. No anxious or depressed appearing.      Assessment    ASSESSMENT (new pt to me 12/2019) DM, first visit with Endo 09/07/2020 HTN Hyperlipidemia CRI: Creatinine fluctuates 1.70- 1.40 CV: --CAD, Dr. Percival Spanish --PAD, fem-pop  2014 Sickle cell anemia COPD BPH, TURP and bilateral retrograde pyelograms 12/10/2019, Dr Hazle Nordmann   PLAN DM: Now managed by endocrinology, since 09-07-2020 HTN: Currently on amlodipine 10 mg, Metoprolol XL 100 mg 1.5 tablets daily.  BPs at the office remain slightly elevated Plan: Monitor BPs closely, see AVS.  BMP today, consider add  ARB's.  Low-salt diet. High cholesterol: On Pravachol, last LDL 81, per cardiology comments, has not been able to tolerate other statin due to increased LFTs.  No change CKD: Checking labs today Preventive care reviewed, will proceed with a CPX in few months: Tdap 2014 PNM 13 2016 PNM 23 2014 COVID VAX x4 CCS: Colonoscopy 2019, next per GI Prostate cancer screening: sees urology RTC CPX 4 months   This visit occurred during the SARS-CoV-2 public health emergency.  Safety protocols were in place, including screening questions prior to the visit, additional usage of staff PPE, and extensive cleaning of exam room while observing appropriate contact time as indicated for disinfecting solutions.

## 2020-11-24 LAB — BASIC METABOLIC PANEL
BUN: 27 mg/dL — ABNORMAL HIGH (ref 6–23)
CO2: 26 mEq/L (ref 19–32)
Calcium: 9.5 mg/dL (ref 8.4–10.5)
Chloride: 105 mEq/L (ref 96–112)
Creatinine, Ser: 1.9 mg/dL — ABNORMAL HIGH (ref 0.40–1.50)
GFR: 34.78 mL/min — ABNORMAL LOW (ref >60.00)
Glucose, Bld: 94 mg/dL (ref 70–99)
Potassium: 4.1 mEq/L (ref 3.5–5.1)
Sodium: 138 mEq/L (ref 135–145)

## 2020-11-25 NOTE — Assessment & Plan Note (Signed)
DM: Now managed by endocrinology, since 09-07-2020 HTN: Currently on amlodipine 10 mg, Metoprolol XL 100 mg 1.5 tablets daily.  BPs at the office remain slightly elevated Plan: Monitor BPs closely, see AVS.  BMP today, consider add ARB's.  Low-salt diet. High cholesterol: On Pravachol, last LDL 81, per cardiology comments, has not been able to tolerate other statin due to increased LFTs.  No change CKD: Checking labs today Preventive care reviewed, will proceed with a CPX in few months: Tdap 2014 PNM 13 2016 PNM 23 2014 COVID VAX x4 CCS: Colonoscopy 2019, next per GI Prostate cancer screening: sees urology RTC CPX 4 months

## 2020-11-26 NOTE — Addendum Note (Signed)
Addended byDamita Dunnings D on: 11/26/2020 07:48 AM   Modules accepted: Orders

## 2020-12-28 ENCOUNTER — Other Ambulatory Visit: Payer: Self-pay | Admitting: *Deleted

## 2020-12-28 DIAGNOSIS — E785 Hyperlipidemia, unspecified: Secondary | ICD-10-CM

## 2020-12-28 MED ORDER — PRAVASTATIN SODIUM 80 MG PO TABS: 80.0000 mg | ORAL_TABLET | Freq: Every evening | ORAL | 1 refills | Status: DC

## 2021-01-06 ENCOUNTER — Encounter: Payer: Medicare Other | Admitting: Dietician

## 2021-01-10 ENCOUNTER — Encounter: Payer: Self-pay | Admitting: Internal Medicine

## 2021-01-10 ENCOUNTER — Other Ambulatory Visit: Payer: Self-pay

## 2021-01-10 ENCOUNTER — Ambulatory Visit: Payer: Medicare Other | Admitting: Internal Medicine

## 2021-01-10 VITALS — BP 142/72 | HR 62 | Ht 65.0 in | Wt 135.0 lb

## 2021-01-10 DIAGNOSIS — E1142 Type 2 diabetes mellitus with diabetic polyneuropathy: Secondary | ICD-10-CM | POA: Diagnosis not present

## 2021-01-10 DIAGNOSIS — Z794 Long term (current) use of insulin: Secondary | ICD-10-CM

## 2021-01-10 DIAGNOSIS — E1122 Type 2 diabetes mellitus with diabetic chronic kidney disease: Secondary | ICD-10-CM | POA: Diagnosis not present

## 2021-01-10 DIAGNOSIS — N1831 Chronic kidney disease, stage 3a: Secondary | ICD-10-CM

## 2021-01-10 LAB — BASIC METABOLIC PANEL
BUN: 24 mg/dL — ABNORMAL HIGH (ref 6–23)
CO2: 26 mEq/L (ref 19–32)
Calcium: 10.3 mg/dL (ref 8.4–10.5)
Chloride: 103 mEq/L (ref 96–112)
Creatinine, Ser: 1.71 mg/dL — ABNORMAL HIGH (ref 0.40–1.50)
GFR: 39.43 mL/min — ABNORMAL LOW (ref >60.00)
Glucose, Bld: 161 mg/dL — ABNORMAL HIGH (ref 70–99)
Potassium: 4 mEq/L (ref 3.5–5.1)
Sodium: 138 mEq/L (ref 135–145)

## 2021-01-10 LAB — POCT GLYCOSYLATED HEMOGLOBIN (HGB A1C): Hemoglobin A1C: 6.5 % — AB (ref 4.0–5.6)

## 2021-01-10 MED ORDER — TOUJEO SOLOSTAR 300 UNIT/ML ~~LOC~~ SOPN: 12.0000 [IU] | PEN_INJECTOR | Freq: Every day | SUBCUTANEOUS | 3 refills | Status: DC

## 2021-01-10 NOTE — Patient Instructions (Addendum)
-   Continue Metformin 500 mg XR, with Breakfast and  Supper - Continue  Jardiance 25 mg, 1 tablet with Breakfast  - Decrease Toujeo 12 units daily      HOW TO TREAT LOW BLOOD SUGARS (Blood sugar LESS THAN 70 MG/DL) Please follow the RULE OF 15 for the treatment of hypoglycemia treatment (when your (blood sugars are less than 70 mg/dL)   STEP 1: Take 15 grams of carbohydrates when your blood sugar is low, which includes:  3-4 GLUCOSE TABS  OR 3-4 OZ OF JUICE OR REGULAR SODA OR ONE TUBE OF GLUCOSE GEL    STEP 2: RECHECK blood sugar in 15 MINUTES STEP 3: If your blood sugar is still low at the 15 minute recheck --> then, go back to STEP 1 and treat AGAIN with another 15 grams of carbohydrates.

## 2021-01-10 NOTE — Progress Notes (Signed)
Name: Steven [name]  Age/ Sex: 75 y.o., male   MRN/ DOB: [id number], 08-Sep-1945     PCP: Colon Branch, MD   Reason for Endocrinology Evaluation: Type 2 Diabetes Mellitus  Initial Endocrine Consultative Visit: 09/07/2020    PATIENT IDENTIFIER: Steven [name] is a 75 y.o. male with a past medical history of T2DM, A.Fibrillation, COPD CAD and Dyslipidemia. The patient has followed with Endocrinology clinic since 09/07/2020 for consultative assistance with management of his diabetes.  DIABETIC HISTORY:  Mr. Grahn was diagnosed with DM in 1977, Has been on Metformin- no intolerance but had to be stopped while in hospital. Has been on insulin since 2014. His hemoglobin A1c has ranged from  in 7.0% in 2021, peaking at 8.4% in 2016.   He was seen by Dr. Loanne Drilling in 2018 but was lost to follow-up until his return to our clinic in 2022.  On his initial visit with me he had an A1c of 8.1% he was on Jardiance and Toujeo, his GFR was 46 at the time and we restarted 50% dose of metformin we also increase his Jardiance and continued insulin.  SUBJECTIVE:   During the last visit (09/07/2020): A1c 8.1%, started metformin, increase Jardiance, and continued Toujeo  Today (01/10/2021): Steven [name]  is here for diabetes management  He checks his blood sugars multiple  times daily, through CGM . The patient has had hypoglycemic episodes since the last clinic visit.  He is symptomatic with these episodes, these mostly occur in the morning hours.  Otherwise he denies any nausea, vomiting, or diarrhea   HOME DIABETES REGIMEN:  Metformin 500 mg XR, 1 tablet with Breakfast and 1 tablet with Supper ( Has 1000 mg tabs and takes half a tablet twice daily ) Jardiance to 25 mg, 1 tablet with Breakfast Toujeo 15 units daily         Statin: yes ACE-I/ARB: no    CONTINUOUS GLUCOSE MONITORING RECORD INTERPRETATION: Unable to download       DIABETIC COMPLICATIONS: Microvascular complications:   Neuropathy, CKD III Denies: retinopathy  Last eye exam: Completed 2021   Macrovascular complications:  CAD Denies:  PVD, CVA   HISTORY:  Past Medical History:  Past Medical History:  Diagnosis Date   Allergy    Arthritis    Right Hip   Atrial fibrillation ([name])    BPH (benign prostatic hyperplasia)    COPD (chronic obstructive pulmonary disease) ([place])    Coronary artery disease    Lexiscan Myoview 7/14:  Inf scar with minimal reversibility, inf HK, EF 45%;  Low risk.  Promus LAD stent placed in [place].  100% RCA   Diabetes 1.5, managed as type 1 ([place])    Diverticulosis    on CT   Dyslipidemia    GERD (gastroesophageal reflux disease)    has used Zantac in the past    Glaucoma    Hammer toes, bilateral    Hernia    Hyperlipidemia    Hypertension    Peripheral vascular disease ([place])    a. s/p R fem-pop BPG 01/2013 (Dr. 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   Sickle cell anemia ([place])    has trait   Past Surgical History:  Past Surgical History:  Procedure Laterality Date   ABDOMINAL AORTAGRAM N/A 12/10/2012   Procedure: ABDOMINAL Maxcine Ham;  Surgeon: Serafina Mitchell, MD;  Location: [facility] CATH LAB;  Service: Cardiovascular;  Laterality: N/A;   CARDIAC CATHETERIZATION     with stent placement  FEMORAL-POPLITEAL BYPASS GRAFT Right 02/07/2013   Procedure: BYPASS GRAFT FEMORAL-POPLITEAL ARTERY- RIGHT;  Surgeon: Mal Misty, MD;  Location: [place];  Service: Vascular;  Laterality: Right;  Right femoral to below knee popliteal bypass using right non reversed great saphenous vein   INGUINAL HERNIA REPAIR  2010   Right   INGUINAL HERNIA REPAIR  09/11/2018   INTRAOPERATIVE ARTERIOGRAM Right 02/07/2013   Procedure: Attempted INTRA OPERATIVE ARTERIOGRAM;  Surgeon: Mal Misty, MD;  Location: [place];  Service: Vascular;  Laterality: Right;   IR RETROGRADE PYELOGRAM  12/10/2019   TRANSURETHRAL RESECTION OF PROSTATE  12/10/2019   Social History:  reports that he quit  smoking about 9 years ago. His smoking use included cigarettes. He has a 15.00 pack-year smoking history. He has never used smokeless tobacco. He reports current alcohol use. He reports that he does not use drugs. Family History:  Family History  Problem Relation Age of Onset   Diabetes Mother    Hypertension Mother    Heart disease Mother    Hyperlipidemia Mother    Diabetes Father    Heart disease Father    Hyperlipidemia Father    Hypertension Father    Diabetes Sister    Heart disease Sister    Hyperlipidemia Sister    Hypertension Sister    Diabetes Brother    Heart disease Brother    Hyperlipidemia Brother    Hypertension Brother    Diabetes Son    Heart disease Son    Hyperlipidemia Son    Hypertension Son    Pancreatic cancer Son    Alcohol abuse Neg Hx    Cancer Neg Hx    Depression Neg Hx    Early death Neg Hx    Kidney disease Neg Hx    Stroke Neg Hx    Colon cancer Neg Hx    Esophageal cancer Neg Hx    Rectal cancer Neg Hx    Stomach cancer Neg Hx    Liver cancer Neg Hx      HOME MEDICATIONS: Allergies as of 01/10/2021   No Known Allergies      Medication List        Accurate as of January 10, 2021 10:47 AM. If you have any questions, ask your nurse or doctor.          STOP taking these medications    tamsulosin 0.4 MG Caps capsule Commonly known as: FLOMAX Stopped by: Dorita Sciara, MD       TAKE these medications    amLODipine 10 MG tablet Commonly known as: NORVASC Take 1 tablet (10 mg total) by mouth daily.   aspirin EC 81 MG tablet Take 81 mg by mouth daily.   BD Pen Needle Nano U/F 32G X 4 MM Misc Generic drug: Insulin Pen Needle use as directed with INSULIN   blood glucose meter kit and supplies Dispense based on patient and insurance preference. Check blood sugars twice daily. Dx: E11.9   empagliflozin 25 MG Tabs tablet Commonly known as: Jardiance Take 1 tablet (25 mg total) by mouth daily before breakfast.    ferrous sulfate 325 (65 FE) MG tablet Take 325 mg by mouth daily with breakfast.   fluticasone 50 MCG/ACT nasal spray Commonly known as: FLONASE Place 2 sprays into both nostrils daily.   FreeStyle Libre 2 Sensor Misc 1 Device by Does not apply route as directed.   glucose blood test strip Commonly known as: OneTouch Verio Use to check blood  sugar 2 times per day   latanoprost 0.005 % ophthalmic solution Commonly known as: XALATAN SMARTSIG:In Eye(s)   metFORMIN 500 MG 24 hr tablet Commonly known as: GLUCOPHAGE-XR Take 1 tablet (500 mg total) by mouth 2 (two) times daily.   metoprolol succinate 100 MG 24 hr tablet Commonly known as: TOPROL-XL Take 1.5 tablets (150 mg total) by mouth daily.   OneTouch Delica Lancets 77L Misc Use to check blood sugar 2 times per day.   OneTouch Verio IQ System w/Device Kit Use to check blood sugar 2 times per day.   Pfizer-BioNTech COVID-19 Vacc 30 MCG/0.3ML injection Generic drug: COVID-19 mRNA vaccine (Pfizer) INJECT AS DIRECTED   pravastatin 80 MG tablet Commonly known as: PRAVACHOL Take 1 tablet (80 mg total) by mouth every evening.   sucralfate 1 g tablet Commonly known as: CARAFATE Take 1 tablet (1 g total) by mouth 4 (four) times daily -  with meals and at bedtime.   Toujeo SoloStar 300 UNIT/ML Solostar Pen Generic drug: insulin glargine (1 Unit Dial) Inject 15 Units into the skin daily.         OBJECTIVE:   Vital Signs: BP (!) 142/72   Pulse 62   Ht _0  (1.651 m)   Wt 135 lb (61.2 kg)   SpO2 99%   BMI 22.47 kg/m   Wt Readings from Last 3 Encounters:  01/10/21 135 lb (61.2 kg)  11/23/20 134 lb 3.2 oz (60.9 kg)  11/11/20 138 lb (62.6 kg)     Exam: General: Pt appears well and is in NAD  Lungs: Clear with good BS bilat with no rales, rhonchi, or wheezes  Heart: RRR with normal S1 and S2 and no gallops; no murmurs; no rub  Abdomen: Normoactive bowel sounds, soft, nontender, without masses or organomegaly  palpable  Extremities: No pretibial edema.   Neuro: MS is good with appropriate affect, pt is alert and Ox3   DM foot exam: 09/07/2020   The skin of the feet is intact without sores or ulcerations. The pedal pulses are absent The sensation is intact to a screening 5.07, 10 gram monofilament bilaterally       DATA REVIEWED:  Lab Results  Component Value Date   HGBA1C 6.5 (A) 01/10/2021   HGBA1C 8.1 (A) 09/07/2020   HGBA1C 7.0 (H) 06/07/2020   Lab Results  Component Value Date   MICROALBUR 74.0 (H) 05/24/2017   LDLCALC 81 09/10/2020   CREATININE 1.90 (H) 11/23/2020   Lab Results  Component Value Date   MICRALBCREAT 51.6 (H) 05/24/2017     Lab Results  Component Value Date   CHOL 140 09/10/2020   HDL 45 09/10/2020   LDLCALC 81 09/10/2020   TRIG 67 09/10/2020   CHOLHDL 3.1 09/10/2020      Results for TRAYVEN, LUMADUE (MRN [id number]) as of 01/10/2021 16:40  Ref. Range 01/10/2021 08:26  Sodium Latest Ref Range: 135 - 145 mEq/L 138  Potassium Latest Ref Range: 3.5 - 5.1 mEq/L 4.0  Chloride Latest Ref Range: 96 - 112 mEq/L 103  CO2 Latest Ref Range: 19 - 32 mEq/L 26  Glucose Latest Ref Range: 70 - 99 mg/dL 161 (H)  BUN Latest Ref Range: 6 - 23 mg/dL 24 (H)  Creatinine Latest Ref Range: 0.40 - 1.50 mg/dL 1.71 (H)  Calcium Latest Ref Range: 8.4 - 10.5 mg/dL 10.3  GFR Latest Ref Range: >60.00 mL/min 39.43 (L)     ASSESSMENT / PLAN / RECOMMENDATIONS:   1) Type 2 Diabetes  Mellitus, optimally controlled, With Neuropathic, CKD III and macrovascular complications - Most recent A1c of 6.5 %. Goal A1c < 7.0 %.     -His A1c down from 8.1%, unfortunately he is having fasting hypoglycemic episode, we will reduce his insulin as below -BMP today shows improvement in his GFR -He understands that metformin will have to be discontinued if his GFR is persistently less than 35, we will continue current dose at this time     MEDICATIONS:  Continue Metformin 500 mg XR, with  Breakfast and  Supper Continue  Jardiance 25 mg, 1 tablet with Breakfast  Decrease Toujeo 12 units daily   EDUCATION / INSTRUCTIONS: BG monitoring instructions: Patient is instructed to check his blood sugars 3 times a day, before meals. Call [place] [facility] if: BG persistently < 70  I reviewed the Rule of 15 for the treatment of hypoglycemia in detail with the patient. Literature supplied.   2) Diabetic complications:  Eye: Does not have known diabetic retinopathy.  Neuro/ Feet: Does have known diabetic peripheral neuropathy .  Renal: Patient does have known baseline CKD. He is under the care of nephrology    F/U in 4 months   Signed electronically by: Mack Guise, MD  [facility]  [facility] [address] [place], [place] [address] Phone: [phone] FAX: [phone]   CC: Colon Branch, [address] [place] [address] Phone: [phone]  Fax: [phone]  Return to Endocrinology clinic as below: Future Appointments  Date Time Provider [place]  02/01/2021  4:30 PM Clydell Hakim, RD [place] [facility]  04/01/2021  8:00 AM Colon Branch, MD [facility]  05/16/2021  7:30 AM Ishmel Acevedo, Melanie Crazier, MD [facility] None

## 2021-01-16 ENCOUNTER — Other Ambulatory Visit: Payer: Self-pay | Admitting: Internal Medicine

## 2021-01-19 ENCOUNTER — Other Ambulatory Visit: Payer: Self-pay

## 2021-01-19 LAB — HM DIABETES EYE EXAM

## 2021-01-19 MED ORDER — METOPROLOL SUCCINATE ER 100 MG PO TB24: 150.0000 mg | ORAL_TABLET | Freq: Every day | ORAL | 1 refills | Status: DC

## 2021-02-01 ENCOUNTER — Encounter: Payer: Medicare Other | Attending: Internal Medicine | Admitting: Dietician

## 2021-02-01 ENCOUNTER — Encounter: Payer: Self-pay | Admitting: Dietician

## 2021-02-01 ENCOUNTER — Other Ambulatory Visit: Payer: Self-pay

## 2021-02-01 DIAGNOSIS — N1831 Chronic kidney disease, stage 3a: Secondary | ICD-10-CM | POA: Diagnosis present

## 2021-02-01 DIAGNOSIS — E1122 Type 2 diabetes mellitus with diabetic chronic kidney disease: Secondary | ICD-10-CM | POA: Diagnosis present

## 2021-02-01 DIAGNOSIS — Z794 Long term (current) use of insulin: Secondary | ICD-10-CM | POA: Diagnosis present

## 2021-02-01 NOTE — Patient Instructions (Addendum)
If you are having problems with your El[place]or [place], call:  Free Style Elenor Legato [phone] Or call me with questions. If you buy a new phone be sure that you get one that works with the [place].  Scan your [place] before and 2 hours after a meal.  Generally the reading should not rise more than 40-60 points.  Evaluate what you ate, how active you are, discuss with your MD if you need more medication or decide if you need to change what you eat.  Continue to choose food that is low in fat and low in sodium. Be mindful of the sugar and jam that you use.  These can make the blood glucose rise quickly.  Consider sugar sub in your coffee . Continue to stay active. Consider having 1 meatless meal per day.  Continue to be mindful to keep your meat portion size small.  (3 ounces is about the size of a deck of cards.)  Limit meat to 6 ounces per day.  1 ounce of cheese or 1 egg counts as 1 ounce of meat.  Recommend that you get glucose tabs and put them in your work truck and personal vehicle.  If you have a low blood sugar, chew 4 glucose tabs and drink water.  Recheck your blood sugar in 15 minutes and eat a snack with protein when your glucose is normal.

## 2021-02-01 NOTE — Progress Notes (Signed)
Patient is here today alone.  He was last seen by this RD 11/11/2020.  Patient states that he is drinking a lot of fluid to stay hydrated. He is drinking 1 Glucerna daily His weight is down 3 lbs since 10/2020.  He states that he is not sure why but may be more careful with what he is eating as well as working hard.   Diabetes Self-Management Education  Visit Type: Follow-up  Appt. Start Time: 1640 Appt. End Time: 7948  02/01/2021  Mr. Rolene Course, identified by name and date of birth, is a 75 y.o. male with a diagnosis of Diabetes:  .   ASSESSMENT  He is avoiding added salt and his wife is not cooking with salt. He is trying to avoid excessive meat intake.  Reviewed his Elenor Legato results:   73% time in target for the past 7 days and 80% time in target for the past 30 days. He is having 0% lows for > 1 month. He does not keep anything to treat hypoglycemia in his truck.  He is very active at work at walks around 10,000 steps daily.   History includes Type 2 diabetes (1977), COPD, CAD, dyslipidemia, .CKD, sickle cell trait Labs noted to include:  A1C 6.5% and eGFR 39 01/10/2021, A1C8.1% 09/07/2020, cholesterol 140, HDL 45, LDL 81, Triglycerides 09/10/2020, GFR 46 07/29/20 increased from 28 07/13/20 Medications include Metformin, Jardiance, Toujeo 12 units q HS. FreeStyle Libre   Weight hx: 135 lbs 02/01/2021 138 lbs 11/11/20 132 lbs 11/05/2020 145 lbs 05/2020 He reports that he lost a lot of weight when he had prostate surgery.   Patient lives with his wife.  She does the shopping and cooking.  Boils or bakes and patient states that she is quite strict with the amount of carbohydrates that she allows. 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 Does not tolerate dairy. Lived in [place] in the past. Weight 135 lb (61.2 kg). Body mass index is 22.47 kg/m.   Diabetes Self-Management Education - 02/01/21 1744       Visit Information   Visit Type Follow-up      Initial Visit    Are you currently following a meal plan? Yes    Are you taking your medications as prescribed? Yes      Pre-Education Assessment   Patient understands the diabetes disease and treatment process. Demonstrates understanding / competency    Patient understands incorporating nutritional management into lifestyle. Needs Review    Patient undertands incorporating physical activity into lifestyle. Demonstrates understanding / competency    Patient understands using medications safely. Demonstrates understanding / competency    Patient understands monitoring blood glucose, interpreting and using results Needs Review    Patient understands prevention, detection, and treatment of acute complications. Demonstrates understanding / competency    Patient understands prevention, detection, and treatment of chronic complications. Demonstrates understanding / competency    Patient understands how to develop strategies to address psychosocial issues. Demonstrates understanding / competency    Patient understands how to develop strategies to promote health/change behavior. Needs Review      Complications   Last HgB A1C per patient/outside source 6.5 %   01/10/21 decreased from 8.1% 08/2020   How often do you check your blood sugar? > 4 times/day    Fasting Blood glucose range (mg/dL) 70-129;130-179   121-146   Postprandial Blood glucose range (mg/dL) 130-179;180-200;>200    Number of hypoglycemic episodes per month 0    Can you tell when  your blood sugar is high? Yes    What do you do if your blood sugar is high? drinks water      Dietary Intake   Breakfast boiled egg, grist, 2 slices bread with jam, occasional applesauce or other fruit, coffee with sugar    Lunch glucerna, banana or other fruit, sausage and bun OR other leftovers or sanwich    Dinner meatloaf, rice, asparagus, cabbage    Snack (evening) nuts      Exercise   Exercise Type Light (walking / raking leaves)    How many days per week to you  exercise? 6    How many minutes per day do you exercise? 60    Total minutes per week of exercise 360      Patient Education   Previous Diabetes Education Yes (please comment)   10/2020   Nutrition management  Meal options for control of blood glucose level and chronic complications.    Physical activity and exercise  Role of exercise on diabetes management, blood pressure control and cardiac health.    Monitoring Taught/evaluated SMBG meter.    Acute complications Taught treatment of hypoglycemia - the 15 rule.;Discussed and identified patients' treatment of hyperglycemia.      Individualized Goals (developed by patient)   Nutrition General guidelines for healthy choices and portions discussed;Other (comment)   low sodium   Physical Activity 60 minutes per day;Exercise 5-7 days per week    Monitoring  test my blood glucose as discussed      Patient Self-Evaluation of Goals - Patient rates self as meeting previously set goals (% of time)   Nutrition >75%    Physical Activity >75%    Medications >75%    Monitoring >75%    Problem Solving >75%    Reducing Risk >75%    Health Coping >75%      Post-Education Assessment   Patient understands the diabetes disease and treatment process. Demonstrates understanding / competency    Patient understands incorporating nutritional management into lifestyle. Needs Review    Patient undertands incorporating physical activity into lifestyle. Demonstrates understanding / competency    Patient understands using medications safely. Demonstrates understanding / competency    Patient understands monitoring blood glucose, interpreting and using results Needs Review    Patient understands prevention, detection, and treatment of acute complications. Demonstrates understanding / competency    Patient understands prevention, detection, and treatment of chronic complications. Demonstrates understanding / competency    Patient understands how to develop  strategies to address psychosocial issues. Demonstrates understanding / competency      Outcomes   Expected Outcomes Demonstrated interest in learning. Expect positive outcomes    Future DMSE 2 months    Program Status Not Completed      Subsequent Visit   Since your last visit have you experienced any weight changes? Loss    Weight Loss (lbs) 3    Since your last visit, are you checking your blood glucose at least once a day? Yes             Individualized Plan for Diabetes Self-Management Training:   Learning Objective:  Patient will have a greater understanding of diabetes self-management. Patient education plan is to attend individual and/or group sessions per assessed needs and concerns.   Plan:   Patient Instructions  If you are having problems with your Elenor Legato, call:  Free Style Elenor Legato [phone] Or call me with questions. If you buy a new phone be sure that you  get one that works with the [employer].  Scan your [place] before and 2 hours after a meal.  Generally the reading should not rise more than 40-60 points.  Evaluate what you ate, how active you are, discuss with your MD if you need more medication or decide if you need to change what you eat.  Continue to choose food that is low in fat and low in sodium. Be mindful of the sugar and jam that you use.  These can make the blood glucose rise quickly.  Consider sugar sub in your coffee . Continue to stay active. Consider having 1 meatless meal per day.  Continue to be mindful to keep your meat portion size small.  (3 ounces is about the size of a deck of cards.)  Limit meat to 6 ounces per day.  1 ounce of cheese or 1 egg counts as 1 ounce of meat.  Recommend that you get glucose tabs and put them in your work truck and personal vehicle.  If you have a low blood sugar, chew 4 glucose tabs and drink water.  Recheck your blood sugar in 15 minutes and eat a snack with protein when your glucose is normal.  Expected Outcomes:   Demonstrated interest in learning. Expect positive outcomes  Education material provided:   If problems or questions, patient to contact team via:  Phone  Future DSME appointment: 2 months

## 2021-03-02 ENCOUNTER — Ambulatory Visit: Payer: Medicare Other | Attending: Internal Medicine

## 2021-03-02 DIAGNOSIS — Z23 Encounter for immunization: Secondary | ICD-10-CM

## 2021-03-02 NOTE — Progress Notes (Signed)
   Covid-19 Vaccination Clinic  Name:  Steven [name]    MRN: [id number] DOB: Jun 12, 1946  03/02/2021  [name] was observed post Covid-19 immunization for 15 minutes without incident. He was provided with Vaccine Information Sheet and instruction to access the V-Safe system.   [name] was instructed to call 911 with any severe reactions post vaccine: Difficulty breathing  Swelling of face and throat  A fast heartbeat  A bad rash all over body  Dizziness and weakness   Immunizations Administered     Name Date Dose VIS Date Route   PFIZER Comrnaty(Gray TOP) Covid-19 Vaccine 03/02/2021  2:13 PM 0.3 mL 07/01/2020 Intramuscular   Manufacturer: [place]   Lot: [id number]   [facility]: [phone]

## 2021-03-10 ENCOUNTER — Encounter: Payer: Self-pay | Admitting: Internal Medicine

## 2021-03-14 ENCOUNTER — Other Ambulatory Visit (HOSPITAL_BASED_OUTPATIENT_CLINIC_OR_DEPARTMENT_OTHER): Payer: Self-pay

## 2021-03-14 MED ORDER — COVID-19 MRNA VAC-TRIS(PFIZER) 30 MCG/0.3ML IM SUSP
INTRAMUSCULAR | 0 refills | Status: DC
  Filled 2021-03-14: qty 0.3, 1d supply, fill #0

## 2021-03-17 ENCOUNTER — Telehealth: Payer: Self-pay | Admitting: Dietician

## 2021-03-17 NOTE — Telephone Encounter (Signed)
Returned patient call. Patient placed a FreeStyle Libre yesterday and it is not functioning today.  He is asking how to change it.  Discussed that he may need to go into settings and stop the old sensor and then restart a sensor.   He should save the malfunctioning sensor and call the [facility] to let them know as they will frequently replace these malfunctioning sensors.  They can also talk him through restarting a new one if needed. Number provided [phone].  Patient to call back as needed.  Antonieta Iba, RD, LDN, CDCES

## 2021-04-01 ENCOUNTER — Ambulatory Visit: Payer: Medicare Other | Admitting: Internal Medicine

## 2021-04-07 ENCOUNTER — Telehealth: Payer: Self-pay | Admitting: Internal Medicine

## 2021-04-07 MED ORDER — COVID-19 AT HOME ANTIGEN TEST VI KIT: PACK | 0 refills | Status: DC

## 2021-04-07 NOTE — Telephone Encounter (Signed)
Rx sent 

## 2021-04-07 NOTE — Telephone Encounter (Signed)
Pt. Wife called in and stated she was informed from his insurance company and the pharmacy that he should have a prescription sent over for a covid test so that it can be covered by his insurance.  [id number] - [place], [place] - [address] AT [facility] [address]  [address], [place] [address]  Phone:  [phone]

## 2021-05-02 ENCOUNTER — Other Ambulatory Visit: Payer: Self-pay

## 2021-05-02 ENCOUNTER — Encounter: Payer: Self-pay | Admitting: Internal Medicine

## 2021-05-02 ENCOUNTER — Ambulatory Visit: Payer: Medicare Other | Admitting: Internal Medicine

## 2021-05-02 VITALS — BP 130/70 | HR 47 | Temp 98.0°F | Resp 18 | Ht 65.0 in | Wt 132.5 lb

## 2021-05-02 DIAGNOSIS — Z23 Encounter for immunization: Secondary | ICD-10-CM | POA: Diagnosis not present

## 2021-05-02 DIAGNOSIS — Z0000 Encounter for general adult medical examination without abnormal findings: Secondary | ICD-10-CM | POA: Diagnosis not present

## 2021-05-02 DIAGNOSIS — N189 Chronic kidney disease, unspecified: Secondary | ICD-10-CM

## 2021-05-02 DIAGNOSIS — I10 Essential (primary) hypertension: Secondary | ICD-10-CM

## 2021-05-02 NOTE — Patient Instructions (Addendum)
Your prior visit with me was in May 2022  Please go to the first floor see if you are due for a COVID-vaccine Consider  the shingles vaccination  Check the  blood pressure twice a week BP GOAL is between 110/65 and  135/85. If it is consistently higher or lower, let me know      [place], [place] back for   blood work, no need to be fast    Come back for a checkup in 6 months     "Living will", "[place] of attorney": Advanced care planning  (If you already have a living will or healthcare power of attorney, please bring the copy to be scanned in your chart.)  Advance care planning is a process that supports adults in  understanding and sharing their preferences regarding future medical care.   The patient's preferences are recorded in documents called Advance Directives.    Advanced directives are completed (and can be modified at any time) while the patient is in full mental capacity.   The documentation should be available at all times to the patient, the family and the healthcare providers.  Bring in a copy to be scanned in your chart is an excellent idea and is recommended   This legal documents direct treatment decision making and/or appoint a surrogate to make the decision if the patient is not capable to do so.    Advance directives can be documented in many types of formats,  documents have names such as:  Lliving will  Durable power of attorney for healthcare (healthcare proxy or healthcare power of attorney)  Combined directives  Physician orders for life-sustaining treatment    More information at:  [email or web address]

## 2021-05-02 NOTE — Progress Notes (Signed)
Subjective:    Patient ID: Steven [name], male    DOB: January 05, 1946, 75 y.o.   MRN: [id number]  DOS:  05/02/2021 Type of visit - description: CPX  Here for CPX In general feeling well. He specifically denies any chest pain or difficulty breathing. No LUTS. Denies claudication  Review of Systems  Other than above, a 14 point review of systems is negative      Past Medical History:  Diagnosis Date   Allergy    Arthritis    Right Hip   Atrial fibrillation ([name])    BPH (benign prostatic hyperplasia)    COPD (chronic obstructive pulmonary disease) ([place])    Coronary artery disease    Lexiscan Myoview 7/14:  Inf scar with minimal reversibility, inf HK, EF 45%;  Low risk.  Promus LAD stent placed in [place].  100% RCA   Diabetes 1.5, managed as type 1 ([place])    Diverticulosis    on CT   Dyslipidemia    GERD (gastroesophageal reflux disease)    has used Zantac in the past    Glaucoma    Hammer toes, bilateral    Hernia    Hyperlipidemia    Hypertension    Peripheral vascular disease ([place])    a. s/p R fem-pop BPG 01/2013 (Dr. 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   Sickle cell anemia ([place])    has trait    Past Surgical History:  Procedure Laterality Date   ABDOMINAL AORTAGRAM N/A 12/10/2012   Procedure: ABDOMINAL Maxcine Ham;  Surgeon: Serafina Mitchell, MD;  Location: [facility] C[facility] LAB;  Service: Cardiovascular;  Laterality: N/A;   CARDIAC CATHETERIZATION     with stent placement   FEMORAL-POPLITEAL BYPASS GRAFT Right 02/07/2013   Procedure: BYPASS GRAFT FEMORAL-POPLITEAL ARTERY- RIGHT;  Surgeon: Mal Misty, MD;  Location[facility] OR;  Service: Vascular;  Laterality: Right;  Right femoral to below knee popliteal bypass using right non reversed great saphenous vein   INGUINAL HERNIA REPAIR  2010   Right   INGUINAL HERNIA REPAIR  09/11/2018   INTRAOPERATIVE ARTERIOGRAM Right 02/07/2013   Procedure: Attempted INTRA OPERATIVE ARTERIOGRAM;  Surgeon: Mal Misty, MD;   Location: [facility] OR;  Service: Vascular;  Laterality: Right;   IR RETROGRADE PYELOGRAM  12/10/2019   TRANSURETHRAL RESECTION OF PROSTATE  12/10/2019   Social History   Socioeconomic History   Marital status: Married    Spouse name: Not on file   Number of children: 5   Years of education: Not on file   Highest education level: Not on file  Occupational History   Occupation: retired   Occupation: Academic librarian industry [place]  Tobacco Use   Smoking status: Former    Packs/day: 0.30    Years: 50.00    Pack years: 15.00    Types: Cigarettes    Quit date: 02/04/2011    Years since quitting: 10.2   Smokeless tobacco: Never  Vaping Use   Vaping Use: Never used  Substance and Sexual Activity   Alcohol use: Not Currently   Drug use: No   Sexual activity: Not on file  Other Topics Concern   Not on file  Social History Narrative   5 children , lost a son    Lives with wife.     Social Determinants of Health   Financial Resource Strain: Not on file  Food Insecurity: Not on file  Transportation Needs: Not on file  Physical Activity: Not on file  Stress: Not on file  Social Connections: Not on file  Intimate Partner Violence: Not on file    Allergies as of 05/02/2021   No Known Allergies      Medication List        Accurate as of May 02, 2021 10:05 PM. If you have any questions, ask your nurse or doctor.          STOP taking these medications    COVID-19 At Home Antigen Test Kit Stopped by: Kathlene November, MD   Pfizer-BioNT COVID-19 Vac-TriS Susp injection Generic drug: COVID-19 mRNA Vac-TriS Therapist, music) Stopped by: Kathlene November, MD   Pfizer-BioNTech COVID-19 Vacc 30 MCG/0.3ML injection Generic drug: COVID-19 mRNA vaccine Therapist, music) Stopped by: Kathlene November, MD       TAKE these medications    amLODipine 10 MG tablet Commonly known as: NORVASC Take 1 tablet (10 mg total) by mouth daily.   aspirin EC 81 MG tablet Take 81 mg by mouth daily.   BD Pen Needle Nano U/F 32G X 4  MM Misc Generic drug: Insulin Pen Needle use as directed with INSULIN   blood glucose meter kit and supplies Dispense based on patient and insurance preference. Check blood sugars twice daily. Dx: E11.9   empagliflozin 25 MG Tabs tablet Commonly known as: Jardiance Take 1 tablet (25 mg total) by mouth daily before breakfast.   ferrous sulfate 325 (65 FE) MG tablet Take 325 mg by mouth daily with breakfast.   fluticasone 50 MCG/ACT nasal spray Commonly known as: FLONASE Place 2 sprays into both nostrils daily.   FreeStyle Libre 2 Sensor Misc 1 Device by Does not apply route as directed.   glucose blood test strip Commonly known as: OneTouch Verio Use to check blood sugar 2 times per day   latanoprost 0.005 % ophthalmic solution Commonly known as: XALATAN SMARTSIG:In Eye(s)   metFORMIN 500 MG 24 hr tablet Commonly known as: GLUCOPHAGE-XR Take 1 tablet (500 mg total) by mouth 2 (two) times daily.   metoprolol succinate 100 MG 24 hr tablet Commonly known as: TOPROL-XL Take 1.5 tablets (150 mg total) by mouth daily.   OneTouch Delica Lancets 56C Misc Use to check blood sugar 2 times per day.   OneTouch Verio IQ System w/Device Kit Use to check blood sugar 2 times per day.   pravastatin 80 MG tablet Commonly known as: PRAVACHOL Take 1 tablet (80 mg total) by mouth every evening.   sucralfate 1 g tablet Commonly known as: CARAFATE Take 1 tablet (1 g total) by mouth 4 (four) times daily -  with meals and at bedtime.   Toujeo SoloStar 300 UNIT/ML Solostar Pen Generic drug: insulin glargine (1 Unit Dial) Inject 12 Units into the skin daily.           Objective:   Physical Exam BP 130/70 (BP Location: Left Arm, Patient Position: Sitting, Cuff Size: Small)   Pulse (!) 47   Temp 98 F (36.7 C) (Oral)   Resp 18   Ht '5\' 5"'  (1.651 m)   Wt 132 lb 8 oz (60.1 kg)   SpO2 98%   BMI 22.05 kg/m  General: Well developed, NAD, BMI noted Neck: No  thyromegaly  HEENT:   Normocephalic . Face symmetric, atraumatic Lungs:  CTA B Normal respiratory effort, no intercostal retractions, no accessory muscle use. Heart: RRR,  no murmur.  Abdomen:  Not distended, soft, non-tender. No rebound or rigidity.   Lower extremities: no pretibial edema bilaterally  Skin: Exposed areas without rash. Not pale. Not jaundice  Neurologic:  alert & oriented X3.  Speech normal, gait appropriate for age and unassisted Strength symmetric and appropriate for age.  Psych: Cognition and judgment appear intact.  Cooperative with normal attention span and concentration.  Behavior appropriate. No anxious or depressed appearing.     Assessment     ASSESSMENT (new pt to me 12/2019) DM, first visit with Endo 09/07/2020 HTN Hyperlipidemia CKD: --Creatinine fluctuates 1.70- 1.40 --CT November 2021: Normal kidneys and bladder CV: --CAD, Dr. Percival Spanish --PAD, fem-pop  2014 Sickle cell anemia COPD BPH, TURP and bilateral retrograde pyelograms 12/10/2019, Dr Hazle Nordmann   PLAN Here for CPX DM: Per Endo, last visit 01/10/2021, A1c decreased to 6.5 CKD: Kidneys and bladder normal per CT 06/09/2020. He was referred but is unclear to me if he has seen nephrology even after I reviewed the chart.  Checking labs Hyperlipidemia: On Pravachol, LDL slightly above goal, cardiology decided not to make any changes due to history of poor tolerance to other statins CAD, PAD: asx COPD: Currently with no symptoms RTC 6 months     This visit occurred during the SARS-CoV-2 public health emergency.  Safety protocols were in place, including screening questions prior to the visit, additional usage of staff PPE, and extensive cleaning of exam room while observing appropriate contact time as indicated for disinfecting solutions.

## 2021-05-02 NOTE — Assessment & Plan Note (Signed)
Here for CPX DM: Per Endo, last visit 01/10/2021, A1c decreased to 6.5 CKD: Kidneys and bladder normal per CT 06/09/2020. He was referred but is unclear to me if he has seen nephrology even after I reviewed the chart.  Checking labs Hyperlipidemia: On Pravachol, LDL slightly above goal, cardiology decided not to make any changes due to history of poor tolerance to other statins CAD, PAD: asx COPD: Currently with no symptoms RTC 6 months

## 2021-05-02 NOTE — Assessment & Plan Note (Signed)
Tdap 2014 PNM 13 -- 2016 PNM 23 -- 2014 S/p zostavax Shingrex recommended  COVID VAX x  4? 5?.  Rec to check w/ our pharmacy at the first floor  Flu shot today  CCS: Colonoscopy 2019, next 5 years  per GI letter  Prostate cancer screening: saw urology 2 weeks ago per pt Labs: BMP CBC POA d/w pt

## 2021-05-03 ENCOUNTER — Other Ambulatory Visit (INDEPENDENT_AMBULATORY_CARE_PROVIDER_SITE_OTHER): Payer: Medicare Other

## 2021-05-03 ENCOUNTER — Telehealth: Payer: Self-pay | Admitting: Internal Medicine

## 2021-05-03 DIAGNOSIS — N189 Chronic kidney disease, unspecified: Secondary | ICD-10-CM

## 2021-05-03 DIAGNOSIS — I10 Essential (primary) hypertension: Secondary | ICD-10-CM

## 2021-05-03 LAB — BASIC METABOLIC PANEL
BUN: 21 mg/dL (ref 6–23)
CO2: 28 mEq/L (ref 19–32)
Calcium: 9.9 mg/dL (ref 8.4–10.5)
Chloride: 104 mEq/L (ref 96–112)
Creatinine, Ser: 1.76 mg/dL — ABNORMAL HIGH (ref 0.40–1.50)
GFR: 38 mL/min — ABNORMAL LOW (ref >60.00)
Glucose, Bld: 146 mg/dL — ABNORMAL HIGH (ref 70–99)
Potassium: 4.2 mEq/L (ref 3.5–5.1)
Sodium: 141 mEq/L (ref 135–145)

## 2021-05-03 LAB — CBC WITH DIFFERENTIAL/PLATELET
Basophils Absolute: 0.1 10*3/uL (ref 0.0–0.1)
Basophils Relative: 1.3 % (ref 0.0–3.0)
Eosinophils Absolute: 0.2 10*3/uL (ref 0.0–0.7)
Eosinophils Relative: 3.4 % (ref 0.0–5.0)
HCT: 49.8 % (ref 39.0–52.0)
Hemoglobin: 16.1 g/dL (ref 13.0–17.0)
Lymphocytes Relative: 37.1 % (ref 12.0–46.0)
Lymphs Abs: 2.7 10*3/uL (ref 0.7–4.0)
MCHC: 32.4 g/dL (ref 30.0–36.0)
MCV: 85.1 fl (ref 78.0–100.0)
Monocytes Absolute: 0.8 10*3/uL (ref 0.1–1.0)
Monocytes Relative: 10.9 % (ref 3.0–12.0)
Neutro Abs: 3.4 10*3/uL (ref 1.4–7.7)
Neutrophils Relative %: 47.3 % (ref 43.0–77.0)
Platelets: 134 10*3/uL — ABNORMAL LOW (ref 150.0–400.0)
RBC: 5.85 Mil/uL — ABNORMAL HIGH (ref 4.22–5.81)
RDW: 16.4 % — ABNORMAL HIGH (ref 11.5–15.5)
WBC: 7.3 10*3/uL (ref 4.0–10.5)

## 2021-05-03 NOTE — Telephone Encounter (Signed)
Form completed, in PCP red folder to be signed.  

## 2021-05-03 NOTE — Telephone Encounter (Signed)
Received

## 2021-05-03 NOTE — Telephone Encounter (Signed)
Pt dropped off document for provider to fill out ([facility] 4 pages Therapeutic Shoes for Diabetes) Pt would like to be called when ready at [phone]. Document put at front office tray under providers name.

## 2021-05-03 NOTE — Telephone Encounter (Signed)
Informed that form is ready for pick up at front desk. Pt verbalized understanding. Copy sent for scanning.

## 2021-05-09 ENCOUNTER — Telehealth: Payer: Self-pay | Admitting: Internal Medicine

## 2021-05-09 NOTE — Telephone Encounter (Signed)
Pt would like to know he was taking off meds (metFORMIN (GLUCOPHAGE-XR) 500 MG 24 hr tablet ). Please advise.      Medication:  metFORMIN (GLUCOPHAGE-XR) 500 MG 24 hr tablet    Has the patient contacted their pharmacy? Yes.   (If no, request that the patient contact the pharmacy for the refill.) (If yes, when and what did the pharmacy advise?) Pharmacy advised Pt to reach out to PCP.     Preferred Pharmacy (with phone number or street name):  [id number] - [place], [place] - [address]  [address] [place], [place] [address]  Phone:  [phone]  Fax:  [phone]

## 2021-05-10 MED ORDER — METFORMIN HCL ER 500 MG PO TB24: 500.0000 mg | ORAL_TABLET | Freq: Two times a day (BID) | ORAL | 0 refills | Status: DC

## 2021-05-10 NOTE — Telephone Encounter (Signed)
Script has been sent.

## 2021-05-12 ENCOUNTER — Other Ambulatory Visit: Payer: Self-pay | Admitting: Internal Medicine

## 2021-05-13 ENCOUNTER — Ambulatory Visit: Payer: Medicare Other | Attending: Internal Medicine

## 2021-05-13 DIAGNOSIS — Z23 Encounter for immunization: Secondary | ICD-10-CM

## 2021-05-13 NOTE — Progress Notes (Signed)
   Covid-19 Vaccination Clinic  Name:  Steven [name]    MRN: [id number] DOB: 07/17/1946  05/13/2021  [name] was observed post Covid-19 immunization for 15 minutes without incident. He was provided with Vaccine Information Sheet and instruction to access the V-Safe system.   [name] was instructed to call 911 with any severe reactions post vaccine: Difficulty breathing  Swelling of face and throat  A fast heartbeat  A bad rash all over body  Dizziness and weakness   Immunizations Administered     Name Date Dose VIS Date Route   Pfizer Covid-19 Vaccine Bivalent Booster 05/13/2021  3:06 PM 0.3 mL 03/23/2021 Intramuscular   Manufacturer: [place]   Lot: [id number]   [place]: [phone]

## 2021-05-16 ENCOUNTER — Ambulatory Visit
Admission: RE | Admit: 2021-05-16 | Discharge: 2021-05-16 | Disposition: A | Discharge status: Home / Self Care | Payer: Medicare Other | Source: Ambulatory Visit | Attending: Internal Medicine | Admitting: Internal Medicine

## 2021-05-16 ENCOUNTER — Encounter: Payer: Self-pay | Admitting: Internal Medicine

## 2021-05-16 ENCOUNTER — Other Ambulatory Visit: Payer: Self-pay

## 2021-05-16 ENCOUNTER — Encounter: Payer: Medicare Other | Attending: Internal Medicine | Admitting: Dietician

## 2021-05-16 ENCOUNTER — Ambulatory Visit: Payer: Medicare Other | Admitting: Internal Medicine

## 2021-05-16 ENCOUNTER — Encounter: Payer: Self-pay | Admitting: Dietician

## 2021-05-16 VITALS — BP 144/90 | HR 66 | Ht 65.0 in | Wt 133.2 lb

## 2021-05-16 DIAGNOSIS — N1831 Chronic kidney disease, stage 3a: Secondary | ICD-10-CM | POA: Diagnosis not present

## 2021-05-16 DIAGNOSIS — E1142 Type 2 diabetes mellitus with diabetic polyneuropathy: Secondary | ICD-10-CM | POA: Diagnosis not present

## 2021-05-16 DIAGNOSIS — M25562 Pain in left knee: Secondary | ICD-10-CM | POA: Diagnosis not present

## 2021-05-16 DIAGNOSIS — M25462 Effusion, left knee: Secondary | ICD-10-CM

## 2021-05-16 DIAGNOSIS — Z794 Long term (current) use of insulin: Secondary | ICD-10-CM | POA: Insufficient documentation

## 2021-05-16 DIAGNOSIS — E1159 Type 2 diabetes mellitus with other circulatory complications: Secondary | ICD-10-CM | POA: Insufficient documentation

## 2021-05-16 DIAGNOSIS — E1122 Type 2 diabetes mellitus with diabetic chronic kidney disease: Secondary | ICD-10-CM | POA: Diagnosis not present

## 2021-05-16 LAB — POCT GLYCOSYLATED HEMOGLOBIN (HGB A1C): Hemoglobin A1C: 7.6 % — AB (ref 4.0–5.6)

## 2021-05-16 MED ORDER — RYBELSUS 3 MG PO TABS: 3.0000 mg | ORAL_TABLET | Freq: Every day | ORAL | 1 refills | Status: DC

## 2021-05-16 MED ORDER — METFORMIN HCL ER 500 MG PO TB24: 500.0000 mg | ORAL_TABLET | Freq: Every day | ORAL | 0 refills | Status: DC

## 2021-05-16 MED ORDER — TOUJEO SOLOSTAR 300 UNIT/ML ~~LOC~~ SOPN: 12.0000 [IU] | PEN_INJECTOR | Freq: Every day | SUBCUTANEOUS | 3 refills | Status: DC

## 2021-05-16 MED ORDER — EMPAGLIFLOZIN 25 MG PO TABS: 25.0000 mg | ORAL_TABLET | Freq: Every day | ORAL | 1 refills | Status: DC

## 2021-05-16 NOTE — Progress Notes (Signed)
Diabetes Self-Management Education  Visit Type: Follow-up  Appt. Start Time: 0810 Appt. End Time: 0840  05/16/2021  [name], identified by name and date of birth, is a 75 y.o. male with a diagnosis of Diabetes:  .   ASSESSMENT Patient is here today alone.  He was last seen by myself 02/01/2021. His A1C has increased from 6.5% 01/10/2021 to 7.6% 05/16/2021. He states this is due to drinking Faygo soda.  He also has been drinking Vitamin Water and is unsure if this has sugar. He has also been out of Metformin for the past 3 weeks. He got a new phone and put the [place] on his phone. Time in range 54% for the past 30 days. Sensor reading 235 currently. He bumped his knee Friday at work and currently walking with a limp.   History includes Type 2 diabetes (1977), COPD, CAD, dyslipidemia, .CKD, sickle cell trait Labs noted to include:  A1C 7.6% 05/16/2021 increased from 6.5% 01/10/2021.  GFR 38 05/03/21. eGFR 39 01/10/2021, A1C8.1% 09/07/2020, cholesterol 140, HDL 45, LDL 81, Triglycerides 09/10/2020, GFR 46 07/29/20 increased from 28 07/13/20 Medications include Metformin, Jardiance, Toujeo 12 units q HS.  He is to start Rybelsus. FreeStyle Libre    Weight hx: 133 lbs 05/16/2021 135 lbs 02/01/2021 138 lbs 11/11/20 132 lbs 11/05/2020 145 lbs 05/2020 He reports that he lost a lot of weight when he had prostate surgery.   Patient lives with his wife.  She does the shopping and cooking.  Boils or bakes and patient states that she is quite strict with the amount of carbohydrates that she allows. 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 Does not tolerate dairy. Lived in [place] in the past.  Weight 133 lb (60.3 kg). Body mass index is 22.13 kg/m.   Diabetes Self-Management Education - 05/16/21 0925       Visit Information   Visit Type Follow-up      Psychosocial Assessment   Other persons present Patient      Pre-Education Assessment   Patient understands the diabetes  disease and treatment process. Demonstrates understanding / competency    Patient understands incorporating nutritional management into lifestyle. Needs Review    Patient undertands incorporating physical activity into lifestyle. Demonstrates understanding / competency    Patient understands using medications safely. Demonstrates understanding / competency    Patient understands monitoring blood glucose, interpreting and using results Needs Review    Patient understands prevention, detection, and treatment of acute complications. Demonstrates understanding / competency    Patient understands prevention, detection, and treatment of chronic complications. Demonstrates understanding / competency    Patient understands how to develop strategies to address psychosocial issues. Demonstrates understanding / competency    Patient understands how to develop strategies to promote health/change behavior. Needs Review      Complications   Last HgB A1C per patient/outside source 7.6 %   05/16/21   How often do you check your blood sugar? > 4 times/day    Fasting Blood glucose range (mg/dL) >200;180-200    Postprandial Blood glucose range (mg/dL) >200    Number of hypoglycemic episodes per month 0      Dietary Intake   Breakfast Quiche, coffee with sugar    Lunch Quiche, [place], bread, occasional banana or grapes    Dinner [place], coursous, rice, asparagus, 1/4 piece cake    Beverage(s) water, coffee with sugar, Faygo soda (regular), vitamin water (?sugar)      Exercise   Exercise Type  ADL's      Patient Education   Previous Diabetes Education Yes (please comment)   12/2020   Nutrition management  Role of diet in the treatment of diabetes and the relationship between the three main macronutrients and blood glucose level;Meal options for control of blood glucose level and chronic complications.    Medications Reviewed patients medication for diabetes, action, purpose, timing of dose and side effects.    how to take Rybelsus   Monitoring Taught/evaluated SMBG meter.;Identified appropriate SMBG and/or A1C goals.      Individualized Goals (developed by patient)   Nutrition General guidelines for healthy choices and portions discussed    Medications take my medication as prescribed    Monitoring  test my blood glucose as discussed    Reducing Risk increase portions of healthy fats;examine blood glucose patterns      Patient Self-Evaluation of Goals - Patient rates self as meeting previously set goals (% of time)   Nutrition 50 - 75 %    Physical Activity >75%    Medications 50 - 75 %    Monitoring >75%    Problem Solving >75%    Reducing Risk 50 - 75 %    Health Coping >75%      Post-Education Assessment   Patient understands the diabetes disease and treatment process. Demonstrates understanding / competency    Patient understands incorporating nutritional management into lifestyle. Needs Review    Patient undertands incorporating physical activity into lifestyle. Demonstrates understanding / competency    Patient understands using medications safely. Demonstrates understanding / competency    Patient understands monitoring blood glucose, interpreting and using results Needs Review    Patient understands prevention, detection, and treatment of acute complications. Demonstrates understanding / competency    Patient understands prevention, detection, and treatment of chronic complications. Demonstrates understanding / competency    Patient understands how to develop strategies to address psychosocial issues. Demonstrates understanding / competency    Patient understands how to develop strategies to promote health/change behavior. Needs Review      Outcomes   Expected Outcomes Demonstrated interest in learning. Expect positive outcomes    Future DMSE 3-4 months    Program Status Not Completed      Subsequent Visit   Since your last visit have you experienced any weight changes? Loss     Weight Loss (lbs) 2             Individualized Plan for Diabetes Self-Management Training:   Learning Objective:  Patient will have a greater understanding of diabetes self-management. Patient education plan is to attend individual and/or group sessions per assessed needs and concerns.   Plan:   Patient Instructions  Read your labels.  Your drinks should say zero total carbohydrates.   Choose fresh fruit rather than fruit juice.  Increase your vegetables.  Take the Rybelsus each am with 4 oz of water.  Eat 30 minutes after taking this medication. Continue to take your medication as prescribed.  Monitor your blood glucose.  Scan before and after each meal to see the effect of your food on your blood glucose.  A rise of 40-60 points or less is acceptable when you eat. Expected Outcomes:  Demonstrated interest in learning. Expect positive outcomes  Education material provided:   If problems or questions, patient to contact team via:  Phone  Future DSME appointment: 3-4 months

## 2021-05-16 NOTE — Patient Instructions (Signed)
-   Restart Metformin 500 mg XR, at 1 tablet with Breakfast - Start Rybelsus 3 mg, 1 tablet daily before Breakfast  - Continue  Jardiance 25 mg, 1 tablet with Breakfast  - Continue Toujeo 12 units daily    For the Knee: take Tylenol as needed for pain, apply ice  HOW TO TREAT LOW BLOOD SUGARS (Blood sugar LESS THAN 70 MG/DL) Please follow the RULE OF 15 for the treatment of hypoglycemia treatment (when your (blood sugars are less than 70 mg/dL)   STEP 1: Take 15 grams of carbohydrates when your blood sugar is low, which includes:  3-4 GLUCOSE TABS  OR 3-4 OZ OF JUICE OR REGULAR SODA OR ONE TUBE OF GLUCOSE GEL    STEP 2: RECHECK blood sugar in 15 MINUTES STEP 3: If your blood sugar is still low at the 15 minute recheck --> then, go back to STEP 1 and treat AGAIN with another 15 grams of carbohydrates.

## 2021-05-16 NOTE — Patient Instructions (Signed)
Read your labels.  Your drinks should say zero total carbohydrates.   Choose fresh fruit rather than fruit juice.  Increase your vegetables.  Take the Rybelsus each am with 4 oz of water.  Eat 30 minutes after taking this medication. Continue to take your medication as prescribed.  Monitor your blood glucose.  Scan before and after each meal to see the effect of your food on your blood glucose.  A rise of 40-60 points or less is acceptable when you eat.

## 2021-05-16 NOTE — Progress Notes (Signed)
Name: Steven [name]  Age/ Sex: 75 y.o., male   MRN/ DOB: [id number], 1945-09-28     PCP: Colon Branch, MD   Reason for Endocrinology Evaluation: Type 2 Diabetes Mellitus  Initial Endocrine Consultative Visit: 09/07/2020    PATIENT IDENTIFIER: [name] is a 75 y.o. male with a past medical history of T2DM, A.Fibrillation, COPD CAD and Dyslipidemia. The patient has followed with Endocrinology clinic since 09/07/2020 for consultative assistance with management of his diabetes.  DIABETIC HISTORY:  Steven [name] was diagnosed with DM in 1977, Has been on Metformin- no intolerance but had to be stopped while in hospital. Has been on insulin since 2014. His hemoglobin A1c has ranged from  in 7.0% in 2021, peaking at 8.4% in 2016.   He was seen by Dr. Loanne Drilling in 2018 but was lost to follow-up until his return to our clinic in 2022.  On his initial visit with me he had an A1c of 8.1% he was on Jardiance and Toujeo, his GFR was 46 at the time and we restarted 50% dose of metformin we also increase his Jardiance and continued insulin.  SUBJECTIVE:   During the last visit (01/10/2021): A1c 6.5%, Continued metformin, Jardiance, and Toujeo  Today (05/16/2021): Steven [name]  is here for diabetes management  He checks his blood sugars multiple  times daily, through CGM . The patient has  had hypoglycemic episodes since the last clinic visit.  He is symptomatic with these episodes, these mostly occur in the morning hours.   He bumped his left knee last Friday , today his knee is hurting   He is not on Metformin for the past few weeks   He has been drinking vitamin water , not sure if this has sugar or not   HOME DIABETES REGIMEN:  Metformin 500 mg XR, BID- not taking  Jardiance to 25 mg, 1 tablet with Breakfast Toujeo 12 units daily         Statin: yes ACE-I/ARB: no     CONTINUOUS GLUCOSE MONITORING RECORD INTERPRETATION    Dates of Recording: 10/11-10/24/2022  Sensor  description: freestyle libre  Results statistics:   CGM use % of time 69  Average and SD 201/27.1  Time in range     43   %  % Time Above 180 38  % Time above 250 19  % Time Below target 0    Glycemic patterns summary: Hyperglycemia noted during the day , optimal at night   Hyperglycemic episodes  postprandial   Hypoglycemic episodes occurred n/a  Overnight periods: trends down      DIABETIC COMPLICATIONS: Microvascular complications:  Neuropathy, CKD III Denies: retinopathy  Last eye exam: Completed 2021   Macrovascular complications:  CAD Denies:  PVD, CVA   HISTORY:  Past Medical History:  Past Medical History:  Diagnosis Date   Allergy    Arthritis    Right Hip   Atrial fibrillation ([name])    BPH (benign prostatic hyperplasia)    COPD (chronic obstructive pulmonary disease) ([place])    Coronary artery disease    Lexiscan Myoview 7/14:  Inf scar with minimal reversibility, inf HK, EF 45%;  Low risk.  Promus LAD stent placed in [place].  100% RCA   Diabetes 1.5, managed as type 1 ([place])    Diverticulosis    on CT   Dyslipidemia    GERD (gastroesophageal reflux disease)    has used Zantac in the past    Glaucoma  Hammer toes, bilateral    Hernia    Hyperlipidemia    Hypertension    Peripheral vascular disease ([place])    a. s/p R fem-pop BPG 01/2013 (Dr. 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   Sickle cell anemia ([place])    has trait   Past Surgical History:  Past Surgical History:  Procedure Laterality Date   ABDOMINAL AORTAGRAM N/A 12/10/2012   Procedure: ABDOMINAL Maxcine Ham;  Surgeon: Serafina Mitchell, MD;  [facility] LAB;  Service: Cardiovascular;  Laterality: N/A;   CARDIAC CATHETERIZATION     with stent placement   FEMORAL-POPLITEAL BYPASS GRAFT Right 02/07/2013   Procedure: BYPASS GRAFT FEMORAL-POPLITEAL ARTERY- RIGHT;  Surgeon: Mal Misty, MD;  Location: [facility] OR;  Service: Vascular;  Laterality: Right;  Right femoral to below knee  popliteal bypass using right non reversed great saphenous vein   INGUINAL HERNIA REPAIR  2010   Right   INGUINAL HERNIA REPAIR  09/11/2018   INTRAOPERATIVE ARTERIOGRAM Right 02/07/2013   Procedure: Attempted INTRA OPERATIVE ARTERIOGRAM;  Surgeon: Mal Misty, MD;  Location: [facility];  Service: Vascular;  Laterality: Right;   IR RETROGRADE PYELOGRAM  12/10/2019   TRANSURETHRAL RESECTION OF PROSTATE  12/10/2019   Social History:  reports that he quit smoking about 10 years ago. His smoking use included cigarettes. He has a 15.00 pack-year smoking history. He has never used smokeless tobacco. He reports that he does not currently use alcohol. He reports that he does not use drugs. Family History:  Family History  Problem Relation Age of Onset   Diabetes Mother    Hypertension Mother    Heart disease Mother    Hyperlipidemia Mother    Diabetes Father    Heart disease Father    Hyperlipidemia Father    Hypertension Father    Diabetes Sister    Heart disease Sister    Hyperlipidemia Sister    Hypertension Sister    Diabetes Brother    Heart disease Brother    Hyperlipidemia Brother    Hypertension Brother    Diabetes Son    Heart disease Son    Hyperlipidemia Son    Hypertension Son    Pancreatic cancer Son    Alcohol abuse Neg Hx    Cancer Neg Hx    Depression Neg Hx    Early death Neg Hx    Kidney disease Neg Hx    Stroke Neg Hx    Colon cancer Neg Hx    Esophageal cancer Neg Hx    Rectal cancer Neg Hx    Stomach cancer Neg Hx    Liver cancer Neg Hx      HOME MEDICATIONS: Allergies as of 05/16/2021   No Known Allergies      Medication List        Accurate as of May 16, 2021  7:47 AM. If you have any questions, ask your nurse or doctor.          STOP taking these medications    glucose blood test strip Commonly known as: Civil engineer, contracting Stopped by: Dorita Sciara, MD   OneTouch Delica Lancets 54T Misc Stopped by: Dorita Sciara, MD        TAKE these medications    amLODipine 10 MG tablet Commonly known as: NORVASC Take 1 tablet (10 mg total) by mouth daily.   aspirin EC 81 MG tablet Take 81 mg by mouth daily.   BD Pen Needle Nano U/F 32G X 4  MM Misc Generic drug: Insulin Pen Needle use as directed with INSULIN   blood glucose meter kit and supplies Dispense based on patient and insurance preference. Check blood sugars twice daily. Dx: E11.9   empagliflozin 25 MG Tabs tablet Commonly known as: Jardiance Take 1 tablet (25 mg total) by mouth daily before breakfast.   ferrous sulfate 325 (65 FE) MG tablet Take 325 mg by mouth daily with breakfast.   fluticasone 50 MCG/ACT nasal spray Commonly known as: FLONASE Place 2 sprays into both nostrils daily.   FreeStyle Libre 2 Sensor Misc 1 Device by Does not apply route as directed.   latanoprost 0.005 % ophthalmic solution Commonly known as: XALATAN SMARTSIG:In Eye(s)   metFORMIN 500 MG 24 hr tablet Commonly known as: GLUCOPHAGE-XR Take 1 tablet (500 mg total) by mouth 2 (two) times daily.   metoprolol succinate 100 MG 24 hr tablet Commonly known as: TOPROL-XL Take 1.5 tablets (150 mg total) by mouth daily.   OneTouch Verio IQ System w/Device Kit Use to check blood sugar 2 times per day.   pravastatin 80 MG tablet Commonly known as: PRAVACHOL Take 1 tablet (80 mg total) by mouth every evening.   sucralfate 1 g tablet Commonly known as: CARAFATE Take 1 tablet (1 g total) by mouth 4 (four) times daily -  with meals and at bedtime.   Toujeo SoloStar 300 UNIT/ML Solostar Pen Generic drug: insulin glargine (1 Unit Dial) Inject 12 Units into the skin daily.         OBJECTIVE:   Vital Signs: BP (!) 144/90 (BP Location: Left Arm, Patient Position: Sitting, Cuff Size: Small)   Pulse 66   Ht '5\' 5"'  (1.651 m)   Wt 133 lb 3.2 oz (60.4 kg)   SpO2 99%   BMI 22.17 kg/m   Wt Readings from Last 3 Encounters:  05/16/21 133 lb 3.2 oz (60.4 kg)   05/02/21 132 lb 8 oz (60.1 kg)  02/01/21 135 lb (61.2 kg)     Exam: General: Pt appears well and is in NAD  Lungs: Clear with good BS bilat with no rales, rhonchi, or wheezes  Heart: RRR  Extremities: No pretibial edema.  Left knee erythema and swelling   Neuro: MS is good with appropriate affect, pt is alert and Ox3   DM foot exam: 09/07/2020   The skin of the feet is intact without sores or ulcerations. The pedal pulses are absent The sensation is intact to a screening 5.07, 10 gram monofilament bilaterally       DATA REVIEWED:  Lab Results  Component Value Date   HGBA1C 7.6 (A) 05/16/2021   HGBA1C 6.5 (A) 01/10/2021   HGBA1C 8.1 (A) 09/07/2020   Lab Results  Component Value Date   MICROALBUR 74.0 (H) 05/24/2017   LDLCALC 81 09/10/2020   CREATININE 1.76 (H) 05/03/2021   Lab Results  Component Value Date   MICRALBCREAT 51.6 (H) 05/24/2017     Lab Results  Component Value Date   CHOL 140 09/10/2020   HDL 45 09/10/2020   LDLCALC 81 09/10/2020   TRIG 67 09/10/2020   CHOLHDL 3.1 09/10/2020     Results for Steven [name], Steven [name] (MRN [id number]) as of 05/16/2021 07:26  Ref. Range 05/03/2021 17:79  BASIC METABOLIC PANEL Unknown Rpt (A)  Sodium Latest Ref Range: 135 - 145 mEq/L 141  Potassium Latest Ref Range: 3.5 - 5.1 mEq/L 4.2  Chloride Latest Ref Range: 96 - 112 mEq/L 104  CO2 Latest Ref Range: 19 -  32 mEq/L 28  Glucose Latest Ref Range: 70 - 99 mg/dL 146 (H)  BUN Latest Ref Range: 6 - 23 mg/dL 21  Creatinine Latest Ref Range: 0.40 - 1.50 mg/dL 1.76 (H)  Calcium Latest Ref Range: 8.4 - 10.5 mg/dL 9.9  GFR Latest Ref Range: >60.00 mL/min 38.00 (L)     Left knee X-Ray 05/16/2021  FINDINGS: There is no evidence for acute fracture or dislocation. There is anterior knee soft tissue swelling. Vascular calcifications are seen in the soft tissues. Joint spaces are well maintained.   IMPRESSION: Negative.   ASSESSMENT / PLAN / RECOMMENDATIONS:   1) Type 2  Diabetes Mellitus, Sub- optimally controlled, With Neuropathic, CKD III and macrovascular complications - Most recent A1c of 7.6  %. Goal A1c < 7.0 %.      - Hypoglycemia resolved  -His A1c has trended up from 6.5 % to 7.6 % , pt noted with postprandial hyperglycemia, we emphasized the importance of low carb diet. I have advised to avoid sugar-sweetened beverages , he is meeting with our nutritionist today  - He has not had his Metformin in a few weeks, with his GFR fluctuating to below 35 at time, will gradually take him off, at this time will restart at 1 tab daily and add GLP-1 agonists, cautioned against GI side effects . No prior hx of pancreatitis      MEDICATIONS:  Restart Metformin 500 mg XR, with Breakfast  Start Rybelsus 3 mg daily  Continue  Jardiance 25 mg, 1 tablet with Breakfast  Continue  Toujeo 12 units daily   EDUCATION / INSTRUCTIONS: BG monitoring instructions: Patient is instructed to check his blood sugars 3 times a day, before meals. Call [facility] if: BG persistently < 70  I reviewed the Rule of 15 for the treatment of hypoglycemia in detail with the patient. Literature supplied.   2) Diabetic complications:  Eye: Does not have known diabetic retinopathy.  Neuro/ Feet: Does have known diabetic peripheral neuropathy .  Renal: Patient does have known baseline CKD. He is under the care of nephrology   3) Left Knee Contusion :  - He was provided with an X-ray order  - Pt to use Tylenol PRN, and ice packs  - Pt to avoid NSAID's due to CKD  - If not better in 1 week, pt to follow up with PCP  - X-ray negative   F/U in 3 months   Signed electronically by: Mack Guise, MD  [facility]  [facility] [address] [place], [place] [address] Phone: [phone] FAX: [phone]   CC: Colon Branch, [address] [place] [address] Phone: [phone]  Fax:  [phone]  Return to Endocrinology clinic as below: Future Appointments  Date Time Provider [place]  05/16/2021  8:00 AM Clydell Hakim, RD [facility]  10/31/2021  8:20 AM Colon Branch, MD [facility]

## 2021-05-20 ENCOUNTER — Telehealth: Payer: Self-pay | Admitting: Pharmacy Technician

## 2021-05-20 ENCOUNTER — Other Ambulatory Visit (HOSPITAL_COMMUNITY): Payer: Self-pay

## 2021-05-20 NOTE — Telephone Encounter (Signed)
Patient Advocate Encounter  Received Fax from CoverMyMeds that PA was required.    Per Test Claim: 30 tablets for 30 days is $20.  Rx was sent to pt's mail order pharmacy so there should be no need for a PA at this time.

## 2021-05-30 ENCOUNTER — Other Ambulatory Visit: Payer: Self-pay

## 2021-05-30 MED ORDER — METFORMIN HCL ER 500 MG PO TB24: 500.0000 mg | ORAL_TABLET | Freq: Every day | ORAL | 0 refills | Status: DC

## 2021-06-06 ENCOUNTER — Telehealth: Payer: Self-pay | Admitting: Internal Medicine

## 2021-06-06 MED ORDER — RYBELSUS 3 MG PO TABS: 3.0000 mg | ORAL_TABLET | Freq: Every day | ORAL | 0 refills | Status: DC

## 2021-06-06 NOTE — Telephone Encounter (Signed)
Pt calling in to request his medication Semaglutide (RYBELSUS) 3 MG TABS   to be sent to   [id number] - [place], [place] - [address] AT [place]  Was sent to [facility], but not delivered yet  Pt contact [phone]

## 2021-06-06 NOTE — Telephone Encounter (Signed)
Script sent  

## 2021-06-10 ENCOUNTER — Other Ambulatory Visit (HOSPITAL_BASED_OUTPATIENT_CLINIC_OR_DEPARTMENT_OTHER): Payer: Self-pay

## 2021-06-10 MED ORDER — PFIZER COVID-19 VAC BIVALENT 30 MCG/0.3ML IM SUSP
INTRAMUSCULAR | 0 refills | Status: DC
  Filled 2021-06-10: qty 0.3, 1d supply, fill #0

## 2021-06-13 ENCOUNTER — Telehealth: Payer: Self-pay | Admitting: Internal Medicine

## 2021-06-13 NOTE — Telephone Encounter (Signed)
Med list faxed to number provided

## 2021-06-13 NOTE — Telephone Encounter (Signed)
[employer] needs current meds faxed over to [phone].

## 2021-06-20 ENCOUNTER — Ambulatory Visit (INDEPENDENT_AMBULATORY_CARE_PROVIDER_SITE_OTHER): Payer: Medicare Other

## 2021-06-20 VITALS — Ht 63.0 in | Wt 133.0 lb

## 2021-06-20 DIAGNOSIS — Z0000 Encounter for general adult medical examination without abnormal findings: Secondary | ICD-10-CM | POA: Diagnosis not present

## 2021-06-20 NOTE — Patient Instructions (Signed)
Steven [name] , Thank you for taking time to complete your Medicare Wellness Visit. I appreciate your ongoing commitment to your health goals. Please review the following plan we discussed and let me know if I can assist you in the future.   Screening recommendations/referrals: Colonoscopy: Completed 12/19/2017-Due 12/20/2022 Recommended yearly ophthalmology/optometry visit for glaucoma screening and checkup Recommended yearly dental visit for hygiene and checkup  Vaccinations: Influenza vaccine: Up to date Pneumococcal vaccine: Up to date Tdap vaccine: Up to date Shingles vaccine: Discuss with pharmacy   Covid-19: Up to date  Advanced directives: Please bring a copy of Living Will and/or Healthcare Power of Attorney for your chart.   Conditions/risks identified: See problem list  Next appointment: Follow up in one year for your annual wellness visit. 06/22/2022 @ 7:40  Preventive Care 73 Years and Older, Male Preventive care refers to lifestyle choices and visits with your health care provider that can promote health and wellness. What does preventive care include? A yearly physical exam. This is also called an annual well check. Dental exams once or twice a year. Routine eye exams. Ask your health care provider how often you should have your eyes checked. Personal lifestyle choices, including: Daily care of your teeth and gums. Regular physical activity. Eating a healthy diet. Avoiding tobacco and drug use. Limiting alcohol use. Practicing safe sex. Taking low doses of aspirin every day. Taking vitamin and mineral supplements as recommended by your health care provider. What happens during an annual well check? The services and screenings done by your health care provider during your annual well check will depend on your age, overall health, lifestyle risk factors, and family history of disease. Counseling  Your health care provider may ask you questions about your: Alcohol  use. Tobacco use. Drug use. Emotional well-being. Home and relationship well-being. Sexual activity. Eating habits. History of falls. Memory and ability to understand (cognition). Work and work Statistician. Screening  You may have the following tests or measurements: Height, weight, and BMI. Blood pressure. Lipid and cholesterol levels. These may be checked every 5 years, or more frequently if you are over 73 years old. Skin check. Lung cancer screening. You may have this screening every year starting at age 73 if you have a 30-pack-year history of smoking and currently smoke or have quit within the past 15 years. Fecal occult blood test (FOBT) of the stool. You may have this test every year starting at age 73. Flexible sigmoidoscopy or colonoscopy. You may have a sigmoidoscopy every 5 years or a colonoscopy every 10 years starting at age 73. Prostate cancer screening. Recommendations will vary depending on your family history and other risks. Hepatitis C blood test. Hepatitis B blood test. Sexually transmitted disease (STD) testing. Diabetes screening. This is done by checking your blood sugar (glucose) after you have not eaten for a while (fasting). You may have this done every 1-3 years. Abdominal aortic aneurysm (AAA) screening. You may need this if you are a current or former smoker. Osteoporosis. You may be screened starting at age 73 if you are at high risk. Talk with your health care provider about your test results, treatment options, and if necessary, the need for more tests. Vaccines  Your health care provider may recommend certain vaccines, such as: Influenza vaccine. This is recommended every year. Tetanus, diphtheria, and acellular pertussis (Tdap, Td) vaccine. You may need a Td booster every 10 years. Zoster vaccine. You may need this after age 55. Pneumococcal 13-valent conjugate (PCV13) vaccine.  One dose is recommended after age 73. Pneumococcal polysaccharide  (PPSV23) vaccine. One dose is recommended after age 6. Talk to your health care provider about which screenings and vaccines you need and how often you need them. This information is not intended to replace advice given to you by your health care provider. Make sure you discuss any questions you have with your health care provider. Document Released: 08/06/2015 Document Revised: 03/29/2016 Document Reviewed: 05/11/2015 Elsevier Interactive Patient Education  2017 [employer] Prevention in the Home Falls can cause injuries. They can happen to people of all ages. There are many things you can do to make your home safe and to help prevent falls. What can I do on the outside of my home? Regularly fix the edges of walkways and driveways and fix any cracks. Remove anything that might make you trip as you walk through a door, such as a raised step or threshold. Trim any bushes or trees on the path to your home. Use bright outdoor lighting. Clear any walking paths of anything that might make someone trip, such as rocks or tools. Regularly check to see if handrails are loose or broken. Make sure that both sides of any steps have handrails. Any raised decks and porches should have guardrails on the edges. Have any leaves, snow, or ice cleared regularly. Use sand or salt on walking paths during winter. Clean up any spills in your garage right away. This includes oil or grease spills. What can I do in the bathroom? Use night lights. Install grab bars by the toilet and in the tub and shower. Do not use towel bars as grab bars. Use non-skid mats or decals in the tub or shower. If you need to sit down in the shower, use a plastic, non-slip stool. Keep the floor dry. Clean up any water that spills on the floor as soon as it happens. Remove soap buildup in the tub or shower regularly. Attach bath mats securely with double-sided non-slip rug tape. Do not have throw rugs and other things on the  floor that can make you trip. What can I do in the bedroom? Use night lights. Make sure that you have a light by your bed that is easy to reach. Do not use any sheets or blankets that are too big for your bed. They should not hang down onto the floor. Have a firm chair that has side arms. You can use this for support while you get dressed. Do not have throw rugs and other things on the floor that can make you trip. What can I do in the kitchen? Clean up any spills right away. Avoid walking on wet floors. Keep items that you use a lot in easy-to-reach places. If you need to reach something above you, use a strong step stool that has a grab bar. Keep electrical cords out of the way. Do not use floor polish or wax that makes floors slippery. If you must use wax, use non-skid floor wax. Do not have throw rugs and other things on the floor that can make you trip. What can I do with my stairs? Do not leave any items on the stairs. Make sure that there are handrails on both sides of the stairs and use them. Fix handrails that are broken or loose. Make sure that handrails are as long as the stairways. Check any carpeting to make sure that it is firmly attached to the stairs. Fix any carpet that is loose or  worn. Avoid having throw rugs at the top or bottom of the stairs. If you do have throw rugs, attach them to the floor with carpet tape. Make sure that you have a light switch at the top of the stairs and the bottom of the stairs. If you do not have them, ask someone to add them for you. What else can I do to help prevent falls? Wear shoes that: Do not have high heels. Have rubber bottoms. Are comfortable and fit you well. Are closed at the toe. Do not wear sandals. If you use a stepladder: Make sure that it is fully opened. Do not climb a closed stepladder. Make sure that both sides of the stepladder are locked into place. Ask someone to hold it for you, if possible. Clearly mark and make  sure that you can see: Any grab bars or handrails. First and last steps. Where the edge of each step is. Use tools that help you move around (mobility aids) if they are needed. These include: Canes. Walkers. Scooters. Crutches. Turn on the lights when you go into a dark area. Replace any light bulbs as soon as they burn out. Set up your furniture so you have a clear path. Avoid moving your furniture around. If any of your floors are uneven, fix them. If there are any pets around you, be aware of where they are. Review your medicines with your doctor. Some medicines can make you feel dizzy. This can increase your chance of falling. Ask your doctor what other things that you can do to help prevent falls. This information is not intended to replace advice given to you by your health care provider. Make sure you discuss any questions you have with your health care provider. Document Released: 05/06/2009 Document Revised: 12/16/2015 Document Reviewed: 08/14/2014 Elsevier Interactive Patient Education  2017 [employer].

## 2021-06-20 NOTE — Progress Notes (Addendum)
Subjective:   Steven [name] is a 75 y.o. male who presents for Medicare Annual/Subsequent preventive examination.  I connected with [place] today by telephone and verified that I am speaking with the correct person using two identifiers. Location patient: home Location provider: work Persons participating in the virtual visit: patient, Marine scientist.    I discussed the limitations, risks, security and privacy concerns of performing an evaluation and management service by telephone and the availability of in person appointments. I also discussed with the patient that there may be a patient responsible charge related to this service. The patient expressed understanding and verbally consented to this telephonic visit.    Interactive audio and video telecommunications were attempted between this provider and patient, however failed, due to patient having technical difficulties OR patient did not have access to video capability.  We continued and completed visit with audio only.  Some vital signs may be absent or patient reported.   Time Spent with patient on telephone encounter: 25 minutes   Review of Systems     Cardiac Risk Factors include: diabetes mellitus;advanced age (>39mn, >>45women);hypertension;male gender;dyslipidemia     Objective:    Today's Vitals   06/20/21 0741  Weight: 133 lb (60.3 kg)  Height: _0  (1.6 m)   Body mass index is 23.56 kg/m.  Advanced Directives 06/20/2021 11/11/2020 06/09/2020 06/09/2020 06/09/2020 12/19/2017 11/26/2017  Does Patient Have a Medical Advance Directive? Yes Yes - No No No Yes  Type of AParamedicof AWabassoLiving will - - - - - HPress photographerLiving will  Does patient want to make changes to medical advance directive? - - - - - - No - Patient declined  Copy of HCiboloin Chart? No - copy requested - - - - - No - copy requested  Would patient like information on creating a medical  advance directive? - - No - Patient declined - - No - Patient declined -  Pre-existing out of facility DNR order (yellow form or pink MOST form) - - - - - - -    Current Medications (verified) Outpatient Encounter Medications as of 06/20/2021  Medication Sig   amLODipine (NORVASC) 10 MG tablet Take 1 tablet (10 mg total) by mouth daily.   aspirin EC 81 MG tablet Take 81 mg by mouth daily.   blood glucose meter kit and supplies Dispense based on patient and insurance preference. Check blood sugars twice daily. Dx: E11.9   Continuous Blood Gluc Sensor (FREESTYLE LIBRE 2 SENSOR) MISC 1 Device by Does not apply route as directed.   empagliflozin (JARDIANCE) 25 MG TABS tablet Take 1 tablet (25 mg total) by mouth daily before breakfast.   ferrous sulfate 325 (65 FE) MG tablet Take 325 mg by mouth daily with breakfast.   fluticasone (FLONASE) 50 MCG/ACT nasal spray Place 2 sprays into both nostrils daily.   insulin glargine, 1 Unit Dial, (TOUJEO SOLOSTAR) 300 UNIT/ML Solostar Pen Inject 12 Units into the skin daily.   Insulin Pen Needle (BD PEN NEEDLE NANO U/F) 32G X 4 MM MISC use as directed with INSULIN   latanoprost (XALATAN) 0.005 % ophthalmic solution SMARTSIG:In Eye(s)   metFORMIN (GLUCOPHAGE-XR) 500 MG 24 hr tablet Take 1 tablet (500 mg total) by mouth daily with breakfast.   metoprolol succinate (TOPROL-XL) 100 MG 24 hr tablet Take 1.5 tablets (150 mg total) by mouth daily.   pravastatin (PRAVACHOL) 80 MG tablet Take 1 tablet (80 mg total) by  mouth every evening.   Semaglutide (RYBELSUS) 3 MG TABS Take 3 mg by mouth daily.   sucralfate (CARAFATE) 1 g tablet Take 1 tablet (1 g total) by mouth 4 (four) times daily -  with meals and at bedtime.   Blood Glucose Monitoring Suppl (ONETOUCH VERIO IQ SYSTEM) w/Device KIT Use to check blood sugar 2 times per day. (Patient not taking: Reported on 05/16/2021)   COVID-19 mRNA bivalent vaccine, Pfizer, (PFIZER COVID-19 VAC BIVALENT) injection Inject into  the muscle. (Patient not taking: Reported on 06/20/2021)   No facility-administered encounter medications on file as of 06/20/2021.    Allergies (verified) Patient has no known allergies.   History: Past Medical History:  Diagnosis Date   Allergy    Arthritis    Right Hip   Atrial fibrillation ([name])    BPH (benign prostatic hyperplasia)    COPD (chronic obstructive pulmonary disease) ([place])    Coronary artery disease    Lexiscan Myoview 7/14:  Inf scar with minimal reversibility, inf HK, EF 45%;  Low risk.  Promus LAD stent placed in [place].  100% RCA   Diabetes 1.5, managed as type 1 ([place])    Diverticulosis    on CT   Dyslipidemia    GERD (gastroesophageal reflux disease)    has used Zantac in the past    Glaucoma    Hammer toes, bilateral    Hernia    Hyperlipidemia    Hypertension    Peripheral vascular disease ([place])    a. s/p R fem-pop BPG 01/2013 (Dr. 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   Past Surgical History:  Procedure Laterality Date   ABDOMINAL AORTAGRAM N/A 12/10/2012   Procedure: ABDOMINAL Maxcine Ham;  Surgeon: Serafina Mitchell, MD;  Location: [facility] CATH LAB;  Service: Cardiovascular;  Laterality: N/A;   CARDIAC CATHETERIZATION     with stent placement   FEMORAL-POPLITEAL BYPASS GRAFT Right 02/07/2013   Procedure: BYPASS GRAFT FEMORAL-POPLITEAL ARTERY- RIGHT;  Surgeon: Mal Misty, MD;  Location: [facility] OR;  Service: Vascular;  Laterality: Right;  Right femoral to below knee popliteal bypass using right non reversed great saphenous vein   INGUINAL HERNIA REPAIR  2010   Right   INGUINAL HERNIA REPAIR  09/11/2018   INTRAOPERATIVE ARTERIOGRAM Right 02/07/2013   Procedure: Attempted INTRA OPERATIVE ARTERIOGRAM;  Surgeon: Mal Misty, MD;  Location: [facility] OR;  Service: Vascular;  Laterality: Right;   IR RETROGRADE PYELOGRAM  12/10/2019   TRANSURETHRAL RESECTION OF PROSTATE  12/10/2019   Family History  Problem  Relation Age of Onset   Diabetes Mother    Hypertension Mother    Heart disease Mother    Hyperlipidemia Mother    Diabetes Father    Heart disease Father    Hyperlipidemia Father    Hypertension Father    Diabetes Sister    Heart disease Sister    Hyperlipidemia Sister    Hypertension Sister    Diabetes Brother    Heart disease Brother    Hyperlipidemia Brother    Hypertension Brother    Diabetes Son    Heart disease Son    Hyperlipidemia Son    Hypertension Son    Pancreatic cancer Son    Alcohol abuse Neg Hx    Cancer Neg Hx    Depression Neg Hx    Early death Neg Hx    Kidney disease Neg Hx    Stroke Neg Hx  Colon cancer Neg Hx    Esophageal cancer Neg Hx    Rectal cancer Neg Hx    Stomach cancer Neg Hx    Liver cancer Neg Hx    Social History   Socioeconomic History   Marital status: Married    Spouse name: Not on file   Number of children: 5   Years of education: Not on file   Highest education level: Not on file  Occupational History   Occupation: retired   Occupation: Academic librarian industry [place]  Tobacco Use   Smoking status: Former    Packs/day: 0.30    Years: 50.00    Pack years: 15.00    Types: Cigarettes    Quit date: 02/04/2011    Years since quitting: 10.3   Smokeless tobacco: Never  Vaping Use   Vaping Use: Never used  Substance and Sexual Activity   Alcohol use: Not Currently   Drug use: No   Sexual activity: Not on file  Other Topics Concern   Not on file  Social History Narrative   5 children , lost a son    Lives with wife.     Social Determinants of Health   Financial Resource Strain: Low Risk    Difficulty of Paying Living Expenses: Not hard at all  Food Insecurity: No Food Insecurity   Worried About Charity fundraiser in the Last Year: Never true   [place] in the Last Year: Never true  Transportation Needs: No Transportation Needs   Lack of Transportation (Medical): No   Lack of Transportation (Non-Medical): No   Physical Activity: Inactive   Days of Exercise per Week: 0 days   Minutes of Exercise per Session: 0 min  Stress: No Stress Concern Present   Feeling of Stress : Not at all  Social Connections: Socially Integrated   Frequency of Communication with Friends and Family: More than three times a week   Frequency of Social Gatherings with Friends and Family: Once a week   Attends Religious Services: More than 4 times per year   Active Member of [employer] or Organizations: Yes   Attends Music therapist: More than 4 times per year   Marital Status: Married    Tobacco Counseling Counseling given: Not Answered   Clinical Intake:  Pre-visit preparation completed: Yes  Pain : No/denies pain     BMI - recorded: 23.56 Nutritional Status: BMI of 19-24  Normal Nutritional Risks: None Diabetes: Yes CBG done?: No Did pt. bring in CBG monitor from home?: No (phone visit)  How often do you need to have someone help you when you read instructions, pamphlets, or other written materials from your doctor or pharmacy?: 1 - Never  Diabetes:  Is the patient diabetic?  Yes  If diabetic, was a CBG obtained today?  No  Did the patient bring in their glucometer from home?  No phone visit How often do you monitor your CBG's? Dexcom.   Financial Strains and Diabetes Management:  Are you having any financial strains with the device, your supplies or your medication? No .  Does the patient want to be seen by Chronic Care Management for management of their diabetes?  No  Would the patient like to be referred to a Nutritionist or for Diabetic Management?  No   Diabetic Exams:  Diabetic Eye Exam: Completed 01/19/2021.   Diabetic Foot Exam: Completed 09/06/2020.   Interpreter Needed?: No  Information entered by :: Caroleen Hamman LPN  Activities of Daily Living In your present state of health, do you have any difficulty performing the following activities: 06/20/2021  Hearing? N   Vision? N  Difficulty concentrating or making decisions? N  Walking or climbing stairs? N  Dressing or bathing? N  Doing errands, shopping? N  Preparing Food and eating ? N  Using the Toilet? N  In the past six months, have you accidently leaked urine? Y  Comment occasionally  Do you have problems with loss of bowel control? N  Managing your Medications? N  Managing your Finances? N  Housekeeping or managing your Housekeeping? N  Some recent data might be hidden    Patient Care Team: Colon Branch, MD as PCP - General (Internal Medicine) Minus Breeding, MD as Attending Physician (Cardiology) Puschinsky, Fransico Him., MD as Consulting Physician (Urology) Antionette Fairy, Isaias Cowman, MD as Referring Physician (Ophthalmology)  Indicate any recent Medical Services you may have received from other than [facility] providers in the past year (date may be approximate).     Assessment:   This is a routine wellness examination for Mehtaab.  Hearing/Vision screen Hearing Screening - Comments:: No issues Vision Screening - Comments:: Last eye exam-12/2020-Dr. Radionchenko  Dietary issues and exercise activities discussed: Current Exercise Habits: The patient has a physically strenuous job, but has no regular exercise apart from work., Exercise limited by: None identified   Goals Addressed             This Visit's Progress    maintain current health   On track      Depression Screen PHQ 2/9 Scores 06/20/2021 11/11/2020 01/09/2020 11/26/2017 02/08/2016 12/29/2014 12/29/2014  PHQ - 2 Score 0 0 3 0 0 0 0  PHQ- 9 Score - - 8 - - - -    Fall Risk Fall Risk  06/20/2021 11/11/2020 01/09/2020 11/26/2017 06/28/2017  Falls in the past year? 0 0 0 No No  Comment - - - - Emmi Telephone Survey: data to providers prior to load  Number falls in past yr: 0 - 0 - -  Injury with Fall? 0 - 0 - -  Follow up Falls prevention discussed - Falls evaluation completed - -    FALL RISK PREVENTION PERTAINING TO THE  HOME:  Any stairs in or around the home? No  Home free of loose throw rugs in walkways, pet beds, electrical cords, etc? Yes  Adequate lighting in your home to reduce risk of falls? Yes   ASSISTIVE DEVICES UTILIZED TO PREVENT FALLS:  Life alert? No  Use of a cane, walker or w/c? No  Grab bars in the bathroom? Yes  Shower chair or bench in shower? No  Elevated toilet seat or a handicapped toilet? No   TIMED UP AND GO:  Was the test performed? No . Phone visit   Cognitive Function:Normal cognitive status assessed by  this Nurse Health Advisor. No abnormalities found.          Immunizations Immunization History  Administered Date(s) Administered   Fluad Quad(high Dose 65+) 06/07/2020, 05/02/2021   Influenza Whole 04/23/2012   Influenza, High Dose Seasonal PF 05/22/2013, 06/14/2016, 05/24/2017, 04/15/2018   Influenza,inj,Quad PF,6+ Mos 07/13/2015   Influenza-Unspecified 04/14/2019   PFIZER Comirnaty(Gray Top)Covid-19 Tri-Sucrose Vaccine 03/02/2021   PFIZER(Purple Top)SARS-COV-2 Vaccination 09/19/2019, 10/15/2019, 06/22/2020   Pfizer Covid-19 Vaccine Bivalent Booster 48yr & up 05/13/2021   Pneumococcal Conjugate-13 12/29/2014   Pneumococcal Polysaccharide-23 10/11/2012   Tdap 05/22/2013   Zoster, Live 12/29/2014    TDAP status:  Up to date  Flu Vaccine status: Up to date  Pneumococcal vaccine status: Up to date  Covid-19 vaccine status: Completed vaccines  Qualifies for Shingles Vaccine? Yes   Zostavax completed Yes   Shingrix Completed?: No.    Education has been provided regarding the importance of this vaccine. Patient has been advised to call insurance company to determine out of pocket expense if they have not yet received this vaccine. Advised may also receive vaccine at local pharmacy or Health Dept. Verbalized acceptance and understanding.  Screening Tests Health Maintenance  Topic Date Due   Zoster Vaccines- Shingrix (1 of 2) Never done   Pneumonia  Vaccine 27+ Years old (3 - PPSV23 if available, else PCV20) 10/11/2017   URINE MICROALBUMIN  05/24/2018   FOOT EXAM  09/06/2021   HEMOGLOBIN A1C  11/14/2021   OPHTHALMOLOGY EXAM  01/19/2022   COLONOSCOPY (Pts 45-32yr Insurance coverage will need to be confirmed)  12/20/2022   TETANUS/TDAP  05/23/2023   INFLUENZA VACCINE  Completed   COVID-19 Vaccine  Completed   Hepatitis C Screening  Completed   HPV VACCINES  Aged Out    Health Maintenance  Health Maintenance Due  Topic Date Due   Zoster Vaccines- Shingrix (1 of 2) Never done   Pneumonia Vaccine 73 Years old (320- PPSV23 if available, else PCV20) 10/11/2017   URINE MICROALBUMIN  05/24/2018    Colorectal cancer screening: Type of screening: Colonoscopy. Completed 12/19/2017. Repeat every 5 years  Lung Cancer Screening: (Low Dose CT Chest recommended if Age 73-80years, 30 pack-year currently smoking OR have quit w/in 15years.) does not qualify.    Additional Screening:  Hepatitis C Screening: Completed 06/10/2020  Vision Screening: Recommended annual ophthalmology exams for early detection of glaucoma and other disorders of the eye. Is the patient up to date with their annual eye exam?  Yes  Who is the provider or what is the name of the office in which the patient attends annual eye exams? Dr. RAntionette Fairy  Dental Screening: Recommended annual dental exams for proper oral hygiene  Community Resource Referral / Chronic Care Management: CRR required this visit?  No   CCM required this visit?  No      Plan:     I have personally reviewed and noted the following in the patient's chart:   Medical and social history Use of alcohol, tobacco or illicit drugs  Current medications and supplements including opioid prescriptions. Patient is not currently taking opioid prescriptions. Functional ability and status Nutritional status Physical activity Advanced directives List of other physicians Hospitalizations,  surgeries, and ER visits in previous 12 months Vitals Screenings to include cognitive, depression, and falls Referrals and appointments  In addition, I have reviewed and discussed with patient certain preventive protocols, quality metrics, and best practice recommendations. A written personalized care plan for preventive services as well as general preventive health recommendations were provided to patient.   Due to this being a telephonic visit, the after visit summary with patients personalized plan was offered to patient via mail or my-chart. Patient would like to access on my-chart.   MMarta Antu LPN   [id number] Nurse Health Advisor  Nurse Notes: None  I have reviewed and agree with Health Coaches documentation.  JKathlene November MD

## 2021-07-07 ENCOUNTER — Other Ambulatory Visit: Payer: Self-pay | Admitting: Internal Medicine

## 2021-07-10 ENCOUNTER — Other Ambulatory Visit: Payer: Self-pay | Admitting: Cardiology

## 2021-07-10 DIAGNOSIS — E785 Hyperlipidemia, unspecified: Secondary | ICD-10-CM

## 2021-07-12 ENCOUNTER — Telehealth: Payer: Self-pay | Admitting: Internal Medicine

## 2021-07-12 NOTE — Telephone Encounter (Signed)
Pt stated he wanted to see if Dr. Hilbert Odor to exceed plan limits. Pharmacy needs approval.  Medication: Semaglutide (RYBELSUS) 3 MG TABS   Has the patient contacted their pharmacy? Yes.   (If no, request that the patient contact the pharmacy for the refill.) (If yes, when and what did the pharmacy advise?)  Preferred Pharmacy (with phone number or street name):  [facility] ) - [place], [place]  [address] [place] [address]  Phone:  [phone]  Fax:  [phone]   Agent: Please be advised that RX refills may take up to 3 business days. We ask that you follow-up with your pharmacy.

## 2021-07-13 ENCOUNTER — Other Ambulatory Visit (HOSPITAL_COMMUNITY): Payer: Self-pay

## 2021-07-13 MED ORDER — RYBELSUS 3 MG PO TABS: 3.0000 mg | ORAL_TABLET | Freq: Every day | ORAL | 0 refills | Status: DC

## 2021-07-13 NOTE — Telephone Encounter (Signed)
Do we have to submit authorization for patient get 90 day supply of Rybelus

## 2021-07-21 LAB — HM DIABETES EYE EXAM

## 2021-08-01 ENCOUNTER — Other Ambulatory Visit: Payer: Self-pay

## 2021-08-01 ENCOUNTER — Other Ambulatory Visit: Payer: Self-pay | Admitting: Internal Medicine

## 2021-08-01 ENCOUNTER — Encounter: Payer: Medicare Other | Attending: Internal Medicine | Admitting: Dietician

## 2021-08-01 ENCOUNTER — Encounter: Payer: Self-pay | Admitting: Dietician

## 2021-08-01 DIAGNOSIS — Z794 Long term (current) use of insulin: Secondary | ICD-10-CM | POA: Insufficient documentation

## 2021-08-01 DIAGNOSIS — E1159 Type 2 diabetes mellitus with other circulatory complications: Secondary | ICD-10-CM | POA: Insufficient documentation

## 2021-08-01 DIAGNOSIS — N1831 Chronic kidney disease, stage 3a: Secondary | ICD-10-CM

## 2021-08-01 NOTE — Progress Notes (Addendum)
Diabetes Self-Management Education  Visit Type: Follow-upFollow up  Appt. Start Time: 0845  Appt. End Time: 0915  08/01/2021  [name], identified by name and date of birth, is a 76 y.o. male with a diagnosis of Diabetes:  .   ASSESSMENT Patient is here today alone.  He was last seen by myself on 05/16/2021.  FreeStyle [place] reviewed.  Patient is not scanning enough.   Fasting glucose around 133.  Post meal glucose >300 most often. 332 today in the office after 60 grams of carbs and a protein choice. He is still using sugar in his coffee.  Beverages are zero carbohydrates except for occasional regular soda.  He is willing to change to stevia in his coffee. He reports taking medication as prescribed. He has turned off [place] alerts as high readings are waking him up.  Turned the low alert back on and discussed that this is needed for safety.   He is not exercising except for on the job. Weight is stable.  History includes Type 2 diabetes (1977), COPD, CAD, dyslipidemia, .CKD, sickle cell trait Labs noted to include:  A1C 7.6% 05/16/2021 increased from 6.5% 01/10/2021.  GFR 38 05/03/21. eGFR 39 01/10/2021, A1C8.1% 09/07/2020, cholesterol 140, HDL 45, LDL 81, Triglycerides 09/10/2020, GFR 46 07/29/20 increased from 28 07/13/20 Medications include Metformin, Jardiance, Toujeo 12 units q HS, Rybelsus FreeStyle Libre    Weight hx: 135 lbs 08/01/2021 133 lbs 05/16/2021 135 lbs 02/01/2021 138 lbs 11/11/20 132 lbs 11/05/2020 145 lbs 05/2020 He reports that he lost a lot of weight when he had prostate surgery.   Patient lives with his wife.  She does the shopping and cooking.  Boils or bakes and patient states that she is quite strict with the amount of carbohydrates that she allows. 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 Does not tolerate dairy. Lived in [place] in the past. Weight 135 lb (61.2 kg). Body mass index is 23.91 kg/m.   Diabetes Self-Management Education - 08/01/21  1116       Visit Information   Visit Type Follow-up      Psychosocial Assessment   Patient Belief/Attitude about Diabetes Motivated to manage diabetes    Self-care barriers None    Self-management support Doctor's office;CDE visits    Other persons present Patient    Patient Concerns Nutrition/Meal planning;Glycemic Control;Problem Solving    Special Needs None    Preferred Learning Style No preference indicated    Learning Readiness Ready      Pre-Education Assessment   Patient understands the diabetes disease and treatment process. Demonstrates understanding / competency    Patient understands incorporating nutritional management into lifestyle. Needs Review    Patient undertands incorporating physical activity into lifestyle. Needs Review    Patient understands using medications safely. Needs Review    Patient understands monitoring blood glucose, interpreting and using results Needs Review    Patient understands prevention, detection, and treatment of acute complications. Demonstrates understanding / competency    Patient understands prevention, detection, and treatment of chronic complications. Demonstrates understanding / competency    Patient understands how to develop strategies to address psychosocial issues. Demonstrates understanding / competency    Patient understands how to develop strategies to promote health/change behavior. Needs Review      Complications   How often do you check your blood sugar? 3-4 times/day    Fasting Blood glucose range (mg/dL) 70-129    Postprandial Blood glucose range (mg/dL) >200   >300 frequently  Number of hypoglycemic episodes per month 0    Number of hyperglycemic episodes per week 21      Dietary Intake   Breakfast instant flavored oatmeal (1 pkt), 1 boiled egg, coffee with 5 tsp sugar    Lunch rotissorie chicken, 2/3 cup brown rice, blackeyed peas, collard greens, fruit    Snack (afternoon) fruit    Dinner chicken, masehd potatoes,  vegetables, occasional cake    Snack (evening) none    Beverage(s) water, coffee with sugar, vitamin water (no carbs per patient), occasional regular soda      Exercise   Exercise Type Light (walking / raking leaves)   on his job     Patient Education   Previous Diabetes Education Yes (please comment)   04/2021   Nutrition management  Meal options for control of blood glucose level and chronic complications.;Food label reading, portion sizes and measuring food.    Physical activity and exercise  Other (comment)   encouraged   Medications Reviewed patients medication for diabetes, action, purpose, timing of dose and side effects.    Monitoring Identified appropriate SMBG and/or A1C goals.      Individualized Goals (developed by patient)   Nutrition General guidelines for healthy choices and portions discussed    Physical Activity Exercise 1-2 times per week;30 minutes per day    Medications take my medication as prescribed    Monitoring  test my blood glucose as discussed    Reducing Risk examine blood glucose patterns;increase portions of healthy fats    Health Coping discuss diabetes with (comment)      Patient Self-Evaluation of Goals - Patient rates self as meeting previously set goals (% of time)   Nutrition 50 - 75 %    Physical Activity 25 - 50%    Medications >75%    Monitoring >75%    Problem Solving 50 - 75 %    Reducing Risk 50 - 75 %    Health Coping >75%      Post-Education Assessment   Patient understands the diabetes disease and treatment process. Demonstrates understanding / competency    Patient understands incorporating nutritional management into lifestyle. Needs Review    Patient undertands incorporating physical activity into lifestyle. Demonstrates understanding / competency    Patient understands using medications safely. Demonstrates understanding / competency    Patient understands monitoring blood glucose, interpreting and using results Demonstrates  understanding / competency    Patient understands prevention, detection, and treatment of acute complications. Demonstrates understanding / competency    Patient understands prevention, detection, and treatment of chronic complications. Demonstrates understanding / competency    Patient understands how to develop strategies to address psychosocial issues. Demonstrates understanding / competency    Patient understands how to develop strategies to promote health/change behavior. Needs Review      Outcomes   Expected Outcomes Demonstrated interest in learning. Expect positive outcomes    Future DMSE 2 months    Program Status Not Completed      Subsequent Visit   Since your last visit have you continued or begun to take your medications as prescribed? Yes    Since your last visit have you experienced any weight changes? Gain    Weight Gain (lbs) 2    Since your last visit, are you checking your blood glucose at least once a day? Yes             Individualized Plan for Diabetes Self-Management Training:   Learning Objective:  Patient  will have a greater understanding of diabetes self-management. Patient education plan is to attend individual and/or group sessions per assessed needs and concerns.   Plan:   Patient Instructions  Scan your Libre at least every 8 hours  When you wake up  Before and after each meal  Before bed Typically your glucose should rise 40-60 points with a meal.  Yours is rising much higher even with 60 grams of carbohydrates and taking your medication consistently.  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 Drink only beverages with no carbohydrates. Consider Stevia rather than sugar in your coffee and oatmeal.  Plain oatmeal with cinnamon, stevia, boiled egg.    Expected Outcomes:  Demonstrated interest in learning. Expect positive outcomes  Education material provided: Heart Healthy carbohydrate  consistent nutrition therapy from AND  If problems or questions, patient to contact team via:  Phone  Future DSME appointment: 2 months

## 2021-08-01 NOTE — Patient Instructions (Addendum)
Scan your Libre at least every 8 hours  When you wake up  Before and after each meal  Before bed Typically your glucose should rise 40-60 points with a meal.  Yours is rising much higher even with 60 grams of carbohydrates and taking your medication consistently.  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 Drink only beverages with no carbohydrates. Consider Stevia rather than sugar in your coffee and oatmeal.  Plain oatmeal with cinnamon, stevia, boiled egg.

## 2021-08-11 ENCOUNTER — Other Ambulatory Visit: Payer: Self-pay | Admitting: Internal Medicine

## 2021-08-15 NOTE — Progress Notes (Signed)
Cardiology Office Note   Date:  08/16/2021   ID:  Steven [name], Steven [name] 1945-12-16, MRN [id number]  PCP:  Colon Branch, MD  Cardiologist:   Minus Breeding, MD    Chief Complaint  Patient presents with   Coronary Artery Disease      History of Present Illness: Steven [name] is a 76 y.o. male who presents for follow up of CAD and coronary stent.   Since I last saw him he has had no new cardiovascular complaints. The patient denies any new symptoms such as chest discomfort, neck or arm discomfort. There has been no new shortness of breath, PND or orthopnea. There have been no reported palpitations, presyncope or syncope.   He is doing a lot of heavy work lifting heavy things and doing a lot of walking.     Past Medical History:  Diagnosis Date   Allergy    Arthritis    Right Hip   Atrial fibrillation ([name])    BPH (benign prostatic hyperplasia)    COPD (chronic obstructive pulmonary disease) ([place])    Coronary artery disease    Lexiscan Myoview 7/14:  Inf scar with minimal reversibility, inf HK, EF 45%;  Low risk.  Promus LAD stent placed in [place].  100% RCA   Diabetes 1.5, managed as type 1 ([place])    Diverticulosis    on CT   Dyslipidemia    GERD (gastroesophageal reflux disease)    has used Zantac in the past    Glaucoma    Hammer toes, bilateral    Hernia    Hyperlipidemia    Hypertension    Peripheral vascular disease ([place])    a. s/p R fem-pop BPG 01/2013 (Dr. 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   Sickle cell anemia ([place])    has trait    Past Surgical History:  Procedure Laterality Date   ABDOMINAL AORTAGRAM N/A 12/10/2012   Procedure: ABDOMINAL Maxcine Ham;  Surgeon: Serafina Mitchell, MD;  Location: [facility] CATH LAB;  Service: Cardiovascular;  Laterality: N/A;   CARDIAC CATHETERIZATION     with stent placement   FEMORAL-POPLITEAL BYPASS GRAFT Right 02/07/2013   Procedure: BYPASS GRAFT FEMORAL-POPLITEAL ARTERY- RIGHT;  Surgeon: Mal Misty, MD;  Location:  [facility] [place] OR;  Service: Vascular;  Laterality: Right;  Right femoral to below knee popliteal bypass using right non reversed great saphenous vein   INGUINAL HERNIA REPAIR  2010   Right   INGUINAL HERNIA REPAIR  09/11/2018   INTRAOPERATIVE ARTERIOGRAM Right 02/07/2013   Procedure: Attempted INTRA OPERATIVE ARTERIOGRAM;  Surgeon: Mal Misty, MD;  Location: [facility];  Service: Vascular;  Laterality: Right;   IR RETROGRADE PYELOGRAM  12/10/2019   TRANSURETHRAL RESECTION OF PROSTATE  12/10/2019     Current Outpatient Medications  Medication Sig Dispense Refill   amLODipine (NORVASC) 10 MG tablet Take 1 tablet (10 mg total) by mouth daily. 90 tablet 1   aspirin EC 81 MG tablet Take 81 mg by mouth daily.     blood glucose meter kit and supplies Dispense based on patient and insurance preference. Check blood sugars twice daily. Dx: E11.9 1 each 0   Blood Glucose Monitoring Suppl (ONETOUCH VERIO IQ SYSTEM) w/Device KIT Use to check blood sugar 2 times per day. 1 kit 2   Continuous Blood Gluc Sensor (FREESTYLE LIBRE 2 SENSOR) MISC USE AS DIRECTED 6 each 3   COVID-19 mRNA bivalent vaccine, Pfizer, (PFIZER COVID-19 VAC BIVALENT) injection Inject into  the muscle. 0.3 mL 0   empagliflozin (JARDIANCE) 25 MG TABS tablet Take 1 tablet (25 mg total) by mouth daily before breakfast. 90 tablet 3   ferrous sulfate 325 (65 FE) MG tablet Take 325 mg by mouth daily with breakfast.     fluticasone (FLONASE) 50 MCG/ACT nasal spray Place 2 sprays into both nostrils daily. 16 g 3   insulin glargine, 1 Unit Dial, (TOUJEO SOLOSTAR) 300 UNIT/ML Solostar Pen Inject 10 Units into the skin daily. 15 mL 3   Insulin Pen Needle 32G X 4 MM MISC 1 Device by Does not apply route daily in the afternoon. 100 each 3   latanoprost (XALATAN) 0.005 % ophthalmic solution SMARTSIG:In Eye(s)     metFORMIN (GLUCOPHAGE-XR) 500 MG 24 hr tablet Take 1 tablet (500 mg total) by mouth daily with breakfast. 90 tablet 3   metoprolol succinate (TOPROL-XL)  100 MG 24 hr tablet TAKE 1 AND 1/2 TABLETS BY  MOUTH DAILY 135 tablet 1   pravastatin (PRAVACHOL) 80 MG tablet Take 1 tablet (80 mg total) by mouth every evening. **PATIENT NEED A APPOINTMENT FOR FUTURE REFILL** 90 tablet 0   Semaglutide (RYBELSUS) 7 MG TABS Take 7 mg by mouth daily. 90 tablet 1   sucralfate (CARAFATE) 1 g tablet Take 1 tablet (1 g total) by mouth 4 (four) times daily -  with meals and at bedtime. 120 tablet 0   No current facility-administered medications for this visit.    Allergies:   Patient has no known allergies.    ROS:  Please see the history of present illness.   Otherwise, review of systems are positive for none.   All other systems are reviewed and negative.    PHYSICAL EXAM: VS:  BP 132/74 (BP Location: Left Arm, Patient Position: Sitting, Cuff Size: Normal)    Pulse (!) 54    Ht 5' 5" (1.651 m)    Wt 136 lb (61.7 kg)    BMI 22.63 kg/m  , BMI Body mass index is 22.63 kg/m. GENERAL:  Well appearing NECK:  No jugular venous distention, waveform within normal limits, carotid upstroke brisk and symmetric, no bruits, no thyromegaly LUNGS:  Clear to auscultation bilaterally CHEST:  Unremarkable HEART:  PMI not displaced or sustained,S1 and S2 within normal limits, no S3, no S4, no clicks, no rubs, no murmurs ABD:  Flat, positive bowel sounds normal in frequency in pitch, no bruits, no rebound, no guarding, no midline pulsatile mass, no hepatomegaly, no splenomegaly EXT:  2 plus pulses upper pulses, absent dorsalis pedis and posterior tibialis bilateral lower., no edema, no cyanosis no clubbing   EKG:  EKG is  ordered today. The ekg ordered today demonstrates sinus rhythm, rate 54, axis within normal limits, intervals within normal limits, old inferior infarct, nonspecific inferior lateral ST depression.  Significant criteria for left ventricular hypertrophy, premature ventricular contraction   Recent Labs: 09/10/2020: ALT 12 05/03/2021: BUN 21; Creatinine, Ser  1.76; Hemoglobin 16.1; Platelets 134.0; Potassium 4.2; Sodium 141    Lipid Panel    Component Value Date/Time   CHOL 140 09/10/2020 0817   TRIG 67 09/10/2020 0817   HDL 45 09/10/2020 0817   CHOLHDL 3.1 09/10/2020 0817   CHOLHDL 3 06/22/2020 1232   VLDL 22.2 06/22/2020 1232   LDLCALC 81 09/10/2020 0817   Lab Results  Component Value Date   HGBA1C 8.4 (A) 08/16/2021      Wt Readings from Last 3 Encounters:  08/16/21 136 lb (61.7 kg)  08/16/21  136 lb (61.7 kg)  08/01/21 135 lb (61.2 kg)      Other studies Reviewed: Additional studies/ records that were reviewed today include: Labs Review of the above records demonstrates: See elsewhere  ASSESSMENT AND PLAN:  CAD  The patient has no new sypmtoms.  No further cardiovascular testing is indicated.  We will continue with aggressive risk reduction and meds as listed.  Dyslipidemia His LDL was 81 with an HDL of 45.  No change in therapy.   HTN The blood pressure is at target.  No change in therapy.    CKD Previous creatinine was 1.76 and this is unchanged from previous.   DM His hemoglobin A1c was up to 7.6 but he just had his Rybelsus dose changed by his endocrinologist.   LVH He has moderate LVH on echo last year.  I will follow this clinically.  He has good blood pressure control.  PVD He does have some active hip pain and stable claudication and is followed by [facility]   Current medicines are reviewed at length with the patient today.  The patient does not have concerns regarding medicines  The following changes have been made: None  Labs/ tests ordered today include:  None  Orders Placed This Encounter  Procedures   EKG 12-Lead     Disposition:   FU with me in 12 months   Signed, Minus Breeding, MD  08/16/2021 5:09 PM    [facility]

## 2021-08-16 ENCOUNTER — Ambulatory Visit: Payer: Medicare Other | Admitting: Internal Medicine

## 2021-08-16 ENCOUNTER — Telehealth: Payer: Self-pay | Admitting: Internal Medicine

## 2021-08-16 ENCOUNTER — Other Ambulatory Visit: Payer: Self-pay

## 2021-08-16 ENCOUNTER — Encounter: Payer: Self-pay | Admitting: Internal Medicine

## 2021-08-16 ENCOUNTER — Ambulatory Visit (INDEPENDENT_AMBULATORY_CARE_PROVIDER_SITE_OTHER): Payer: Medicare Other | Admitting: Cardiology

## 2021-08-16 ENCOUNTER — Encounter: Payer: Self-pay | Admitting: Cardiology

## 2021-08-16 VITALS — BP 132/74 | HR 54 | Ht 65.0 in | Wt 136.0 lb

## 2021-08-16 VITALS — BP 136/80 | HR 74 | Ht 63.0 in | Wt 136.0 lb

## 2021-08-16 DIAGNOSIS — E1159 Type 2 diabetes mellitus with other circulatory complications: Secondary | ICD-10-CM

## 2021-08-16 DIAGNOSIS — Z794 Long term (current) use of insulin: Secondary | ICD-10-CM | POA: Diagnosis not present

## 2021-08-16 DIAGNOSIS — E785 Hyperlipidemia, unspecified: Secondary | ICD-10-CM

## 2021-08-16 DIAGNOSIS — E1165 Type 2 diabetes mellitus with hyperglycemia: Secondary | ICD-10-CM

## 2021-08-16 DIAGNOSIS — I10 Essential (primary) hypertension: Secondary | ICD-10-CM

## 2021-08-16 DIAGNOSIS — I2510 Atherosclerotic heart disease of native coronary artery without angina pectoris: Secondary | ICD-10-CM

## 2021-08-16 DIAGNOSIS — E1142 Type 2 diabetes mellitus with diabetic polyneuropathy: Secondary | ICD-10-CM

## 2021-08-16 DIAGNOSIS — N182 Chronic kidney disease, stage 2 (mild): Secondary | ICD-10-CM

## 2021-08-16 LAB — POCT GLYCOSYLATED HEMOGLOBIN (HGB A1C): Hemoglobin A1C: 8.4 % — AB (ref 4.0–5.6)

## 2021-08-16 MED ORDER — TOUJEO SOLOSTAR 300 UNIT/ML ~~LOC~~ SOPN: 10.0000 [IU] | PEN_INJECTOR | Freq: Every day | SUBCUTANEOUS | 3 refills | Status: DC

## 2021-08-16 MED ORDER — RYBELSUS 7 MG PO TABS: 7.0000 mg | ORAL_TABLET | Freq: Every day | ORAL | 1 refills | Status: DC

## 2021-08-16 MED ORDER — EMPAGLIFLOZIN 25 MG PO TABS: 25.0000 mg | ORAL_TABLET | Freq: Every day | ORAL | 3 refills | Status: DC

## 2021-08-16 MED ORDER — METFORMIN HCL ER 500 MG PO TB24: 500.0000 mg | ORAL_TABLET | Freq: Every day | ORAL | 3 refills | Status: DC

## 2021-08-16 MED ORDER — INSULIN PEN NEEDLE 32G X 4 MM MISC: 1.0000 | Freq: Every day | 3 refills | Status: DC

## 2021-08-16 NOTE — Progress Notes (Signed)
Name: Steven [name]  Age/ Sex: 76 y.o., male   MRN/ DOB: [id number], [birth year]     PCP: Colon Branch, MD   Reason for Endocrinology Evaluation: Type 2 Diabetes Mellitus  Initial Endocrine Consultative Visit: 09/07/2020    PATIENT IDENTIFIER: Steven [name] is a 76 y.o. male with a past medical history of T2DM, A.Fibrillation, COPD CAD and Dyslipidemia. The patient has followed with Endocrinology clinic since 09/07/2020 for consultative assistance with management of his diabetes.  DIABETIC HISTORY:  Steven [name] was diagnosed with DM in 1977, Has been on Metformin- no intolerance but had to be stopped while in hospital. Has been on insulin since 2014. His hemoglobin A1c has ranged from  in 7.0% in 2021, peaking at 8.4% in 2016.   He was seen by Dr. Loanne Drilling in 2018 but was lost to follow-up until his return to our clinic in 2022.  On his initial visit with me he had an A1c of 8.1% he was on Jardiance and Toujeo, his GFR was 46 at the time and we restarted 50% dose of metformin we also increase his Jardiance and continued insulin.   Rybelsus started 04/2021  SUBJECTIVE:   During the last visit (05/16/2021): A1c 7.6 %, Continued metformin, Jardiance, and Toujeo and started Rybelsus     Today (08/16/2021): Steven [name]  is here for diabetes management  He checks his blood sugars multiple  times daily, through CGM . The patient has  had hypoglycemic episodes since the last clinic visit.  He is not symptomatic with these episodes.  Denies nausea, vomiting or diarrhea   This morning he ate fruits, grapes, yogurt, peanut butter crackers and coffee with sugar    HOME DIABETES REGIMEN:  Metformin 500 mg XR, daily  Jardiance to 25 mg, 1 tablet with Breakfast Toujeo 12 units daily  Rybelsus 3 mg daily       Statin: yes ACE-I/ARB: no     CONTINUOUS GLUCOSE MONITORING RECORD INTERPRETATION    Dates of Recording: 1/11-1/24/2023  Sensor description: freestyle  libre  Results statistics:   CGM use % of time 34  Average and SD 142/32.9  Time in range    82%  % Time Above 180 15  % Time above 250 3  % Time Below target 0    Glycemic patterns summary: Hyperglycemia noted during the day postprandial with hypoglycemia at night   Hyperglycemic episodes  postprandial   Hypoglycemic episodes occurred at night - fasting   Overnight periods: trends down      DIABETIC COMPLICATIONS: Microvascular complications:  Neuropathy, CKD III Denies: retinopathy  Last eye exam: Completed 2021   Macrovascular complications:  CAD Denies:  PVD, CVA   HISTORY:  Past Medical History:  Past Medical History:  Diagnosis Date   Allergy    Arthritis    Right Hip   Atrial fibrillation ([name])    BPH (benign prostatic hyperplasia)    COPD (chronic obstructive pulmonary disease) ([place])    Coronary artery disease    Lexiscan Myoview 7/14:  Inf scar with minimal reversibility, inf HK, EF 45%;  Low risk.  Promus LAD stent placed in [place].  100% RCA   Diabetes 1.5, managed as type 1 ([place])    Diverticulosis    on CT   Dyslipidemia    GERD (gastroesophageal reflux disease)    has used Zantac in the past    Glaucoma    Hammer toes, bilateral    Hernia    Hyperlipidemia  Hypertension    Peripheral vascular disease ([place])    a. s/p R fem-pop BPG 01/2013 (Dr. 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 CATH LAB;  Service: Cardiovascular;  Laterality: N/A;   CARDIAC CATHETERIZATION     with stent placement   FEMORAL-POPLITEAL BYPASS GRAFT Right 02/07/2013   Procedure: BYPASS GRAFT FEMORAL-POPLITEAL ARTERY- RIGHT;  Surgeon: Mal Misty, MD;  Location: [facility] OR;  Service: Vascular;  Laterality: Right;  Right femoral to  below knee popliteal bypass using right non reversed great saphenous vein   INGUINAL HERNIA REPAIR  2010   Right   INGUINAL HERNIA REPAIR  09/11/2018   INTRAOPERATIVE ARTERIOGRAM Right 02/07/2013   Procedure: Attempted INTRA OPERATIVE ARTERIOGRAM;  Surgeon: Mal Misty, MD;  Location: [place];  Service: Vascular;  Laterality: Right;   IR RETROGRADE PYELOGRAM  12/10/2019   TRANSURETHRAL RESECTION OF PROSTATE  12/10/2019   Social History:  reports that he quit smoking about 10 years ago. His smoking use included cigarettes. He has a 15.00 pack-year smoking history. He has never used smokeless tobacco. He reports that he does not currently use alcohol. He reports that he does not use drugs. Family History:  Family History  Problem Relation Age of Onset   Diabetes Mother    Hypertension Mother    Heart disease Mother    Hyperlipidemia Mother    Diabetes Father    Heart disease Father    Hyperlipidemia Father    Hypertension Father    Diabetes Sister    Heart disease Sister    Hyperlipidemia Sister    Hypertension Sister    Diabetes Brother    Heart disease Brother    Hyperlipidemia Brother    Hypertension Brother    Diabetes Son    Heart disease Son    Hyperlipidemia Son    Hypertension Son    Pancreatic cancer Son    Alcohol abuse Neg Hx    Cancer Neg Hx    Depression Neg Hx    Early death Neg Hx    Kidney disease Neg Hx    Stroke Neg Hx    Colon cancer Neg Hx    Esophageal cancer Neg Hx    Rectal cancer Neg Hx    Stomach cancer Neg Hx    Liver cancer Neg Hx      HOME MEDICATIONS: Allergies as of 08/16/2021   No Known Allergies      Medication List        Accurate as of August 16, 2021  9:20 AM. If you have any questions, ask your nurse or doctor.          amLODipine 10 MG tablet Commonly known as: NORVASC Take 1 tablet (10 mg total) by mouth daily.   aspirin EC 81 MG tablet Take 81 mg by mouth daily.   blood glucose meter kit and supplies Dispense  based on patient and insurance preference. Check blood sugars twice daily. Dx: E11.9   empagliflozin 25 MG Tabs tablet Commonly known as: Jardiance Take 1 tablet (25 mg total) by mouth daily before breakfast.   ferrous sulfate 325 (65 FE) MG tablet Take 325 mg by mouth daily with breakfast.   fluticasone 50 MCG/ACT nasal spray Commonly known as:  FLONASE Place 2 sprays into both nostrils daily.   FreeStyle Libre 2 Sensor Misc USE AS DIRECTED   Insulin Pen Needle 32G X 4 MM Misc 1 Device by Does not apply route daily in the afternoon. What changed:  how much to take how to take this when to take this additional instructions Changed by: Dorita Sciara, MD   latanoprost 0.005 % ophthalmic solution Commonly known as: XALATAN SMARTSIG:In Eye(s)   metFORMIN 500 MG 24 hr tablet Commonly known as: GLUCOPHAGE-XR Take 1 tablet (500 mg total) by mouth daily with breakfast.   metoprolol succinate 100 MG 24 hr tablet Commonly known as: TOPROL-XL TAKE 1 AND 1/2 TABLETS BY  MOUTH DAILY   OneTouch Verio IQ System w/Device Kit Use to check blood sugar 2 times per day.   Pfizer COVID-19 Vac Bivalent injection Generic drug: COVID-19 mRNA bivalent vaccine Therapist, music) Inject into the muscle.   pravastatin 80 MG tablet Commonly known as: PRAVACHOL Take 1 tablet (80 mg total) by mouth every evening. **PATIENT NEED A APPOINTMENT FOR FUTURE REFILL**   Rybelsus 7 MG Tabs Generic drug: Semaglutide Take 7 mg by mouth daily. What changed:  medication strength how much to take Changed by: Dorita Sciara, MD   sucralfate 1 g tablet Commonly known as: CARAFATE Take 1 tablet (1 g total) by mouth 4 (four) times daily -  with meals and at bedtime.   Toujeo SoloStar 300 UNIT/ML Solostar Pen Generic drug: insulin glargine (1 Unit Dial) Inject 10 Units into the skin daily. What changed: how much to take Changed by: Dorita Sciara, MD         OBJECTIVE:   Vital Signs:  BP 136/80 (BP Location: Left Arm, Patient Position: Sitting, Cuff Size: Small)    Pulse 74    Ht _0  (1.6 m)    Wt 136 lb (61.7 kg)    SpO2 96%    BMI 24.09 kg/m   Wt Readings from Last 3 Encounters:  08/16/21 136 lb (61.7 kg)  08/01/21 135 lb (61.2 kg)  06/20/21 133 lb (60.3 kg)     Exam: General: Pt appears well and is in NAD  Lungs: Clear with good BS bilat with no rales, rhonchi, or wheezes  Heart: RRR  Extremities: No pretibial edema.  Left knee erythema and swelling   Neuro: MS is good with appropriate affect, pt is alert and Ox3   DM foot exam: 09/07/2020   The skin of the feet is intact without sores or ulcerations. The pedal pulses are absent The sensation is intact to a screening 5.07, 10 gram monofilament bilaterally       DATA REVIEWED:  Lab Results  Component Value Date   HGBA1C 8.4 (A) 08/16/2021   HGBA1C 7.6 (A) 05/16/2021   HGBA1C 6.5 (A) 01/10/2021   Lab Results  Component Value Date   MICROALBUR 74.0 (H) 05/24/2017   LDLCALC 81 09/10/2020   CREATININE 1.76 (H) 05/03/2021   Lab Results  Component Value Date   MICRALBCREAT 51.6 (H) 05/24/2017     Lab Results  Component Value Date   CHOL 140 09/10/2020   HDL 45 09/10/2020   LDLCALC 81 09/10/2020   TRIG 67 09/10/2020   CHOLHDL 3.1 09/10/2020     Results for REUEL, LAMADRID (MRN [id number]) as of 05/16/2021 07:26  Ref. Range 05/03/2021 03:88  BASIC METABOLIC PANEL Unknown Rpt (A)  Sodium Latest Ref Range: 135 - 145 mEq/L 141  Potassium Latest Ref Range: 3.5 -  5.1 mEq/L 4.2  Chloride Latest Ref Range: 96 - 112 mEq/L 104  CO2 Latest Ref Range: 19 - 32 mEq/L 28  Glucose Latest Ref Range: 70 - 99 mg/dL 146 (H)  BUN Latest Ref Range: 6 - 23 mg/dL 21  Creatinine Latest Ref Range: 0.40 - 1.50 mg/dL 1.76 (H)  Calcium Latest Ref Range: 8.4 - 10.5 mg/dL 9.9  GFR Latest Ref Range: >60.00 mL/min 38.00 (L)       ASSESSMENT / PLAN / RECOMMENDATIONS:   1) Type 2 Diabetes Mellitus, Poorly   controlled, With Neuropathic, CKD III and macrovascular complications - Most recent A1c of 8.4 %. Goal A1c < 7.0 %.      -Pt with persistent hyperglycemia, he has been noted with hypoglycemia overnight, so will reduce his basal rate  - His post-prandial BG today was 240 mg/dl but he ate a high starch meal to include yogurt, two types of fruits, peanuttbutter and crackers as well as sugar-sweetened coffee. We discussed examples of low carb diet  - I have offered him prandial insulin but he would like to avoid this for now  - Will increase Rybelsus . No prior hx of pancreatitis  - Unable to increase metformin due to low GFR     MEDICATIONS: Contact Metformin 500 mg XR, with Breakfast  Increase Rybelsus 7 mg daily  Continue  Jardiance 25 mg, 1 tablet with Breakfast  Decrease  Toujeo 10 units daily   EDUCATION / INSTRUCTIONS: BG monitoring instructions: Patient is instructed to check his blood sugars 3 times a day, before meals. Call [facility] if: BG persistently < 70  I reviewed the Rule of 15 for the treatment of hypoglycemia in detail with the patient. Literature supplied.   2) Diabetic complications:  Eye: Does not have known diabetic retinopathy.  Neuro/ Feet: Does have known diabetic peripheral neuropathy .  Renal: Patient does have known baseline CKD. He is under the care of nephrology    F/U in 4 months   Signed electronically by: Mack Guise, MD  [facility]  [facility] [address] [place], [place] [address] Phone: [phone] FAX: [phone]   CC: Colon Branch, [address] [place] [address] Phone: [phone]  Fax: [phone]  Return to Endocrinology clinic as below: Future Appointments  Date Time Provider [place]  08/16/2021  4:00 PM Minus Breeding, MD [facility]  10/03/2021  8:00 AM Clydell Hakim, RD [facility]  10/31/2021  8:20 AM Colon Branch, MD [facility]  12/20/2021  7:50 AM Hazley Dezeeuw, Melanie Crazier, MD [facility] [place]  06/22/2022  7:40 AM [facility] CO[facility]

## 2021-08-16 NOTE — Telephone Encounter (Signed)
Steven [name],    I saw Mr. Edmondson today for diabetes management. His post prandial BG was 240 mg/dL after breakfast. He tells me he ate yogurt, fruit, peanut butter and crackers as well as grapes and added sugar to his coffee.   I did discuss this is a  lot of carbs for one meal but he seems to be confused. He did tell me that you gave him a list of things he could eat but he still seems to be unsure what are his starches and how he could put it all together.      Thanks a lot

## 2021-08-16 NOTE — Patient Instructions (Addendum)
Medication Instructions:  No changes *If you need a refill on your cardiac medications before your next appointment, please call your pharmacy*   Lab Work: None ordered If you have labs (blood work) drawn today and your tests are completely normal, you will receive your results only by: [place] (if you have MyChart) OR A paper copy in the mail If you have any lab test that is abnormal or we need to change your treatment, we will call you to review the results.   Testing/Procedures: None ordered   Follow-Up: At [facility], you and your health needs are our priority.  As part of our continuing mission to provide you with exceptional heart care, we have created designated Provider Care Teams.  These Care Teams include your primary Cardiologist (physician) and Advanced Practice Providers (APPs -  Physician Assistants and Nurse Practitioners) who all work together to provide you with the care you need, when you need it.  We recommend signing up for the patient portal called "MyChart".  Sign up information is provided on this After Visit Summary.  MyChart is used to connect with patients for Virtual Visits (Telemedicine).  Patients are able to view lab/test results, encounter notes, upcoming appointments, etc.  Non-urgent messages can be sent to your provider as well.   To learn more about what you can do with MyChart, go to [email or web address].    Your next appointment:   12 month(s)  The format for your next appointment:   In Person  Provider:   Dr. Percival Spanish

## 2021-08-16 NOTE — Patient Instructions (Signed)
-   Continue Metformin 500 mg XR, at 1 tablet with Breakfast - Increase Rybelsus 7 mg, 1 tablet daily before Breakfast  - Continue  Jardiance 25 mg, 1 tablet with Breakfast  - Decrease Toujeo 10 units daily     HOW TO TREAT LOW BLOOD SUGARS (Blood sugar LESS THAN 70 MG/DL) Please follow the RULE OF 15 for the treatment of hypoglycemia treatment (when your (blood sugars are less than 70 mg/dL)   STEP 1: Take 15 grams of carbohydrates when your blood sugar is low, which includes:  3-4 GLUCOSE TABS  OR 3-4 OZ OF JUICE OR REGULAR SODA OR ONE TUBE OF GLUCOSE GEL    STEP 2: RECHECK blood sugar in 15 MINUTES STEP 3: If your blood sugar is still low at the 15 minute recheck --> then, go back to STEP 1 and treat AGAIN with another 15 grams of carbohydrates.

## 2021-08-18 ENCOUNTER — Telehealth: Payer: Self-pay | Admitting: Internal Medicine

## 2021-08-18 NOTE — Telephone Encounter (Signed)
Patient states that his meter has been off in the early morning stating sugars are low.  Patient has been taking all medication as prescribed.

## 2021-08-18 NOTE — Telephone Encounter (Signed)
Patient called experience Low/High BG.  08/17/2021  2 am BG 66 08/18/2021  3:30 am BG 180  Asking for advise and instruction dealing with Low BG's.  Please call patient at [phone]

## 2021-08-18 NOTE — Telephone Encounter (Signed)
Patient notified

## 2021-08-19 ENCOUNTER — Encounter: Payer: Self-pay | Admitting: Internal Medicine

## 2021-09-05 ENCOUNTER — Other Ambulatory Visit: Payer: Self-pay

## 2021-09-05 DIAGNOSIS — I70219 Atherosclerosis of native arteries of extremities with intermittent claudication, unspecified extremity: Secondary | ICD-10-CM

## 2021-09-14 ENCOUNTER — Ambulatory Visit (HOSPITAL_COMMUNITY)
Admission: RE | Admit: 2021-09-14 | Discharge: 2021-09-14 | Disposition: A | Discharge status: Home / Self Care | Payer: Medicare Other | Source: Ambulatory Visit | Attending: Vascular Surgery | Admitting: Vascular Surgery

## 2021-09-14 ENCOUNTER — Other Ambulatory Visit: Payer: Self-pay

## 2021-09-14 ENCOUNTER — Ambulatory Visit (INDEPENDENT_AMBULATORY_CARE_PROVIDER_SITE_OTHER)
Admission: RE | Admit: 2021-09-14 | Discharge: 2021-09-14 | Disposition: A | Discharge status: Home / Self Care | Payer: Medicare Other | Source: Ambulatory Visit | Attending: Vascular Surgery | Admitting: Vascular Surgery

## 2021-09-14 ENCOUNTER — Other Ambulatory Visit (HOSPITAL_COMMUNITY): Payer: Self-pay | Admitting: Vascular Surgery

## 2021-09-14 ENCOUNTER — Ambulatory Visit (INDEPENDENT_AMBULATORY_CARE_PROVIDER_SITE_OTHER): Payer: Medicare Other | Admitting: Physician Assistant

## 2021-09-14 VITALS — BP 156/74 | HR 53 | Temp 97.0°F | Resp 20 | Ht 65.0 in | Wt 133.1 lb

## 2021-09-14 DIAGNOSIS — I739 Peripheral vascular disease, unspecified: Secondary | ICD-10-CM

## 2021-09-14 DIAGNOSIS — Z09 Encounter for follow-up examination after completed treatment for conditions other than malignant neoplasm: Secondary | ICD-10-CM | POA: Diagnosis not present

## 2021-09-14 DIAGNOSIS — I70219 Atherosclerosis of native arteries of extremities with intermittent claudication, unspecified extremity: Secondary | ICD-10-CM | POA: Insufficient documentation

## 2021-09-14 DIAGNOSIS — Z95828 Presence of other vascular implants and grafts: Secondary | ICD-10-CM | POA: Diagnosis not present

## 2021-09-14 NOTE — Progress Notes (Signed)
Office Note     CC:  follow up Requesting Provider:  Colon Branch, MD  HPI: Steven [name] is a 75 y.o. ([birth year]) male who presents for follow up of PAD. He has remote history of right fem- popliteal artery vein bypass by Dr. Kellie Simmering in July of 2014. This was for lifestyle limiting claudication. His symptoms have remained resolve in the RLE in follow up. At his last visit a year ago he was having mild claudication in his left leg but this was not lifestyle limiting and overall ABI stable.   He returns today for 1 year follow up with non invasive studies. Today he reports that he continues to have discomfort in both his hips upon initiation of any activity. He describes it as "heaviness". It does not radiate anywhere. It is relieved with rest. This is not limiting his ability to work or do other activities around the house like mowing his yard. He also complains of numbness to the touch on right leg since his surgery. This is unchanged. He denies any pain in his lower legs on ambulation or at rest. Does not have any pain that wakes him up from sleep. No non healing wounds  The pt is on a statin for cholesterol management.  The pt is on a daily aspirin.   Other AC:  none The pt is on BB, CCB for hypertension.   The pt is diabetic.  Tobacco hx:  Former, quit 2012  Past Medical History:  Diagnosis Date   Allergy    Arthritis    Right Hip   Atrial fibrillation ([place])    BPH (benign prostatic hyperplasia)    COPD (chronic obstructive pulmonary disease) ([place])    Coronary artery disease    Lexiscan Myoview 7/14:  Inf scar with minimal reversibility, inf HK, EF 45%;  Low risk.  Promus LAD stent placed in [place].  100% RCA   Diabetes 1.5, managed as type 1 ([place])    Diverticulosis    on CT   Dyslipidemia    GERD (gastroesophageal reflux disease)    has used Zantac in the past    Glaucoma    Hammer toes, bilateral    Hernia    Hyperlipidemia    Hypertension    Peripheral vascular  disease ([place])    a. s/p R fem-pop BPG 01/2013 (Dr. 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   Sickle cell anemia ([place])    has trait    Past Surgical History:  Procedure Laterality Date   ABDOMINAL AORTAGRAM N/A 12/10/2012   Procedure: ABDOMINAL Maxcine Ham;  Surgeon: Serafina Mitchell, MD;  Location: [facility] CATH LAB;  Service: Cardiovascular;  Laterality: N/A;   CARDIAC CATHETERIZATION     with stent placement   FEMORAL-POPLITEAL BYPASS GRAFT Right 02/07/2013   Procedure: BYPASS GRAFT FEMORAL-POPLITEAL ARTERY- RIGHT;  Surgeon: Mal Misty, MD;  Location: [facility] OR;  Service: Vascular;  Laterality: Right;  Right femoral to below knee popliteal bypass using right non reversed great saphenous vein   INGUINAL HERNIA REPAIR  2010   Right   INGUINAL HERNIA REPAIR  09/11/2018   INTRAOPERATIVE ARTERIOGRAM Right 02/07/2013   Procedure: Attempted INTRA OPERATIVE ARTERIOGRAM;  Surgeon: Mal Misty, MD;  Location: [facility] OR;  Service: Vascular;  Laterality: Right;   IR RETROGRADE PYELOGRAM  12/10/2019   TRANSURETHRAL RESECTION OF PROSTATE  12/10/2019    Social History   Socioeconomic History   Marital status: Married    Spouse name: Not on file  Number of children: 5   Years of education: Not on file   Highest education level: Not on file  Occupational History   Occupation: retired   Occupation: Academic librarian industry [place]  Tobacco Use   Smoking status: Former    Packs/day: 0.30    Years: 50.00    Pack years: 15.00    Types: Cigarettes    Quit date: 02/04/2011    Years since quitting: 10.6    Passive exposure: Never   Smokeless tobacco: Never  Vaping Use   Vaping Use: Never used  Substance and Sexual Activity   Alcohol use: Not Currently   Drug use: No   Sexual activity: Not on file  Other Topics Concern   Not on file  Social History Narrative   5 children , lost a son    Lives with wife.     Social Determinants of Health   Financial Resource Strain: Low Risk    Difficulty of Paying  Living Expenses: Not hard at all  Food Insecurity: No Food Insecurity   Worried About Charity fundraiser in the Last Year: Never true   [place] in the Last Year: Never true  Transportation Needs: No Transportation Needs   Lack of Transportation (Medical): No   Lack of Transportation (Non-Medical): No  Physical Activity: Inactive   Days of Exercise per Week: 0 days   Minutes of Exercise per Session: 0 min  Stress: No Stress Concern Present   Feeling of Stress : Not at all  Social Connections: Socially Integrated   Frequency of Communication with Friends and Family: More than three times a week   Frequency of Social Gatherings with Friends and Family: Once a week   Attends Religious Services: More than 4 times per year   Active Member of [employer] or Organizations: Yes   Attends Music therapist: More than 4 times per year   Marital Status: Married  Human resources officer Violence: Not At Risk   Fear of Current or Ex-Partner: No   Emotionally Abused: No   Physically Abused: No   Sexually Abused: No    Family History  Problem Relation Age of Onset   Diabetes Mother    Hypertension Mother    Heart disease Mother    Hyperlipidemia Mother    Diabetes Father    Heart disease Father    Hyperlipidemia Father    Hypertension Father    Diabetes Sister    Heart disease Sister    Hyperlipidemia Sister    Hypertension Sister    Diabetes Brother    Heart disease Brother    Hyperlipidemia Brother    Hypertension Brother    Diabetes Son    Heart disease Son    Hyperlipidemia Son    Hypertension Son    Pancreatic cancer Son    Alcohol abuse Neg Hx    Cancer Neg Hx    Depression Neg Hx    Early death Neg Hx    Kidney disease Neg Hx    Stroke Neg Hx    Colon cancer Neg Hx    Esophageal cancer Neg Hx    Rectal cancer Neg Hx    Stomach cancer Neg Hx    Liver cancer Neg Hx     Current Outpatient Medications  Medication Sig Dispense Refill   amLODipine (NORVASC) 10  MG tablet Take 1 tablet (10 mg total) by mouth daily. 90 tablet 1   aspirin EC 81 MG tablet Take 81 mg  by mouth daily.     blood glucose meter kit and supplies Dispense based on patient and insurance preference. Check blood sugars twice daily. Dx: E11.9 1 each 0   Blood Glucose Monitoring Suppl (ONETOUCH VERIO IQ SYSTEM) w/Device KIT Use to check blood sugar 2 times per day. 1 kit 2   Continuous Blood Gluc Sensor (FREESTYLE LIBRE 2 SENSOR) MISC USE AS DIRECTED 6 each 3   COVID-19 mRNA bivalent vaccine, Pfizer, (PFIZER COVID-19 VAC BIVALENT) injection Inject into the muscle. 0.3 mL 0   empagliflozin (JARDIANCE) 25 MG TABS tablet Take 1 tablet (25 mg total) by mouth daily before breakfast. 90 tablet 3   ferrous sulfate 325 (65 FE) MG tablet Take 325 mg by mouth daily with breakfast.     fluticasone (FLONASE) 50 MCG/ACT nasal spray Place 2 sprays into both nostrils daily. 16 g 3   insulin glargine, 1 Unit Dial, (TOUJEO SOLOSTAR) 300 UNIT/ML Solostar Pen Inject 10 Units into the skin daily. 15 mL 3   Insulin Pen Needle 32G X 4 MM MISC 1 Device by Does not apply route daily in the afternoon. 100 each 3   latanoprost (XALATAN) 0.005 % ophthalmic solution SMARTSIG:In Eye(s)     metFORMIN (GLUCOPHAGE-XR) 500 MG 24 hr tablet Take 1 tablet (500 mg total) by mouth daily with breakfast. 90 tablet 3   metoprolol succinate (TOPROL-XL) 100 MG 24 hr tablet TAKE 1 AND 1/2 TABLETS BY  MOUTH DAILY 135 tablet 1   pravastatin (PRAVACHOL) 80 MG tablet Take 1 tablet (80 mg total) by mouth every evening. **PATIENT NEED A APPOINTMENT FOR FUTURE REFILL** 90 tablet 0   Semaglutide (RYBELSUS) 7 MG TABS Take 7 mg by mouth daily. 90 tablet 1   sucralfate (CARAFATE) 1 g tablet Take 1 tablet (1 g total) by mouth 4 (four) times daily -  with meals and at bedtime. 120 tablet 0   No current facility-administered medications for this visit.    No Known Allergies   REVIEW OF SYSTEMS:  '[X]'  denotes positive finding, '[ ]'  denotes  negative finding Cardiac  Comments:  Chest pain or chest pressure:    Shortness of breath upon exertion:    Short of breath when lying flat:    Irregular heart rhythm:        Vascular    Pain in calf, thigh, or hip brought on by ambulation:    Pain in feet at night that wakes you up from your sleep:     Blood clot in your veins:    Leg swelling:         Pulmonary    Oxygen at home:    Productive cough:     Wheezing:         Neurologic    Sudden weakness in arms or legs:     Sudden numbness in arms or legs:     Sudden onset of difficulty speaking or slurred speech:    Temporary loss of vision in one eye:     Problems with dizziness:         Gastrointestinal    Blood in stool:     Vomited blood:         Genitourinary    Burning when urinating:     Blood in urine:        Psychiatric    Major depression:         Hematologic    Bleeding problems:    Problems with blood clotting too easily:  Skin    Rashes or ulcers:        Constitutional    Fever or chills:      PHYSICAL EXAMINATION:  Vitals:   09/14/21 0855  BP: (!) 156/74  Pulse: (!) 53  Resp: 20  Temp: (!) 97 F (36.1 C)  TempSrc: Temporal  SpO2: 99%  Weight: 133 lb 1.6 oz (60.4 kg)  Height: '5\' 5"'  (1.651 m)    General:  WDWN in NAD; vital signs documented above Gait: Normal HENT: WNL, normocephalic Pulmonary: normal non-labored breathing , without wheezing Cardiac: regular HR, without  Murmurs without carotid bruit Abdomen: soft, NT, no masses Vascular Exam/Pulses:  Right Left  Radial 2+ (normal) 2+ (normal)  Femoral 2+ (normal) 2+ (normal)  Popliteal Not palpable Not palpable  DP absent absent  PT absent absent   Extremities: without ischemic changes, without Gangrene , without cellulitis; without open wounds;  Musculoskeletal: no muscle wasting or atrophy  Neurologic: A&O X 3;  No focal weakness or paresthesias are detected Psychiatric:  The pt has Normal affect.   Non-Invasive  Vascular Imaging:   +-------+-----------+-----------+------------+------------+   ABI/TBI Today's ABI Today's TBI Previous ABI Previous TBI   +-------+-----------+-----------+------------+------------+   Right   0.95        0.44        0.94         0.42           +-------+-----------+-----------+------------+------------+   Left    0.50        0.00        0.49         0.15           +-------+-----------+-----------+------------+------------+   +----------+--------+-----+---------------+--------+--------+   RIGHT      PSV cm/s Ratio Stenosis        Waveform Comments   +----------+--------+-----+---------------+--------+--------+   EIA Distal 175                            biphasic            +----------+--------+-----+---------------+--------+--------+   CFA Distal 241            50-74% stenosis biphasic            +----------+--------+-----+---------------+--------+--------+      Right Graft #1: Fem- Pop bypass graft  +------------------+--------+--------+--------+--------+                      PSV cm/s Stenosis Waveform Comments   +------------------+--------+--------+--------+--------+   Inflow             189               biphasic            +------------------+--------+--------+--------+--------+   Prox Anastomosis   230               biphasic            +------------------+--------+--------+--------+--------+   Proximal Graft     67                biphasic            +------------------+--------+--------+--------+--------+   Mid Graft          60                biphasic            +------------------+--------+--------+--------+--------+   Distal Graft  59                biphasic            +------------------+--------+--------+--------+--------+   Distal Anastomosis 68                biphasic            +------------------+--------+--------+--------+--------+   Outflow            91                biphasic             +------------------+--------+--------+--------+--------+   Summary:  Right: 50-74% stenosis noted in the common femoral artery.    ASSESSMENT/PLAN:: 76 y.o. male here for follow up of PAD. He has remote history of right fem- popliteal artery vein bypass by Dr. Kellie Simmering in July of 2014. He has hip pain but not classically claudication and it is not lifestyle limiting. He has no rest pain or tissue loss. His ABIs are stable bilaterally. His duplex today shows patent right lower extremity bypass graft.  - encourage him to continue walking as much as possible - continue Aspirin and statin - he knows to call for earlier follow up if he has  new or worsening symptoms - he will follow up in 1 year with repeat ABI and RLE bypass graft duplex   Karoline Caldwell, PA-C [facility] [phone]  Clinic MD:   Dr. Donzetta Matters

## 2021-09-30 ENCOUNTER — Other Ambulatory Visit: Payer: Self-pay | Admitting: Internal Medicine

## 2021-10-03 ENCOUNTER — Ambulatory Visit: Payer: Medicare Other | Admitting: Dietician

## 2021-10-03 ENCOUNTER — Other Ambulatory Visit: Payer: Self-pay | Admitting: Cardiology

## 2021-10-03 DIAGNOSIS — E785 Hyperlipidemia, unspecified: Secondary | ICD-10-CM

## 2021-10-05 ENCOUNTER — Telehealth: Payer: Self-pay

## 2021-10-05 NOTE — Telephone Encounter (Signed)
Called pt to reschedule for later time on 06/22/22 ?

## 2021-10-23 ENCOUNTER — Other Ambulatory Visit: Payer: Self-pay | Admitting: Internal Medicine

## 2021-10-31 ENCOUNTER — Ambulatory Visit: Payer: Medicare Other | Admitting: Internal Medicine

## 2021-10-31 ENCOUNTER — Encounter: Payer: Self-pay | Admitting: Internal Medicine

## 2021-10-31 VITALS — BP 136/68 | HR 68 | Temp 98.0°F | Resp 16 | Ht 65.0 in | Wt 135.2 lb

## 2021-10-31 DIAGNOSIS — I10 Essential (primary) hypertension: Secondary | ICD-10-CM

## 2021-10-31 DIAGNOSIS — Z23 Encounter for immunization: Secondary | ICD-10-CM | POA: Diagnosis not present

## 2021-10-31 DIAGNOSIS — N182 Chronic kidney disease, stage 2 (mild): Secondary | ICD-10-CM

## 2021-10-31 DIAGNOSIS — E785 Hyperlipidemia, unspecified: Secondary | ICD-10-CM | POA: Diagnosis not present

## 2021-10-31 LAB — COMPREHENSIVE METABOLIC PANEL
ALT: 13 U/L (ref 0–53)
AST: 18 U/L (ref 0–37)
Albumin: 4.1 g/dL (ref 3.5–5.2)
Alkaline Phosphatase: 90 U/L (ref 39–117)
BUN: 24 mg/dL — ABNORMAL HIGH (ref 6–23)
CO2: 26 mEq/L (ref 19–32)
Calcium: 9.5 mg/dL (ref 8.4–10.5)
Chloride: 103 mEq/L (ref 96–112)
Creatinine, Ser: 1.69 mg/dL — ABNORMAL HIGH (ref 0.40–1.50)
GFR: 39.76 mL/min — ABNORMAL LOW (ref >60.00)
Glucose, Bld: 175 mg/dL — ABNORMAL HIGH (ref 70–99)
Potassium: 3.8 mEq/L (ref 3.5–5.1)
Sodium: 139 mEq/L (ref 135–145)
Total Bilirubin: 0.5 mg/dL (ref 0.2–1.2)
Total Protein: 6.7 g/dL (ref 6.0–8.3)

## 2021-10-31 LAB — CBC WITH DIFFERENTIAL/PLATELET
Basophils Absolute: 0 10*3/uL (ref 0.0–0.1)
Basophils Relative: 0.4 % (ref 0.0–3.0)
Eosinophils Absolute: 0.2 10*3/uL (ref 0.0–0.7)
Eosinophils Relative: 3.6 % (ref 0.0–5.0)
HCT: 45.9 % (ref 39.0–52.0)
Hemoglobin: 15.1 g/dL (ref 13.0–17.0)
Lymphocytes Relative: 32.8 % (ref 12.0–46.0)
Lymphs Abs: 1.8 10*3/uL (ref 0.7–4.0)
MCHC: 32.8 g/dL (ref 30.0–36.0)
MCV: 84.3 fl (ref 78.0–100.0)
Monocytes Absolute: 0.5 10*3/uL (ref 0.1–1.0)
Monocytes Relative: 8.9 % (ref 3.0–12.0)
Neutro Abs: 3 10*3/uL (ref 1.4–7.7)
Neutrophils Relative %: 54.3 % (ref 43.0–77.0)
Platelets: 136 10*3/uL — ABNORMAL LOW (ref 150.0–400.0)
RBC: 5.45 Mil/uL (ref 4.22–5.81)
RDW: 15.5 % (ref 11.5–15.5)
WBC: 5.6 10*3/uL (ref 4.0–10.5)

## 2021-10-31 LAB — LIPID PANEL
Cholesterol: 164 mg/dL (ref 0–200)
HDL: 58.4 mg/dL (ref >39.00)
LDL Cholesterol: 81 mg/dL (ref 0–99)
NonHDL: 105.83
Total CHOL/HDL Ratio: 3
Triglycerides: 122 mg/dL (ref 0.0–149.0)
VLDL: 24.4 mg/dL (ref 0.0–40.0)

## 2021-10-31 NOTE — Patient Instructions (Addendum)
Check the  blood pressure regularly ?BP GOAL is between 110/65 and  135/85. ?If it is consistently higher or lower, let me know ? ?GO TO THE LAB : Get the blood work   ? ? ?[place], [place] ?Come back for a physical exam by October ? ? ?Diabetes Mellitus and Foot Care ?Foot care is an important part of your health, especially when you have diabetes. Diabetes may cause you to have problems because of poor blood flow (circulation) to your feet and legs, which can cause your skin to: ?Become thinner and drier. ?Break more easily. ?Heal more slowly. ?Peel and crack. ?You may also have nerve damage (neuropathy) in your legs and feet, causing decreased feeling in them. This means that you may not notice minor injuries to your feet that could lead to more serious problems. Noticing and addressing any potential problems early is the best way to prevent future foot problems. ?How to care for your feet ?Foot hygiene ? ?Wash your feet daily with warm water and mild soap. Do not use hot water. Then, pat your feet and the areas between your toes until they are completely dry. Do not soak your feet as this can dry your skin. ?Trim your toenails straight across. Do not dig under them or around the cuticle. File the edges of your nails with an emery board or nail file. ?Apply a moisturizing lotion or petroleum jelly to the skin on your feet and to dry, brittle toenails. Use lotion that does not contain alcohol and is unscented. Do not apply lotion between your toes. ?Shoes and socks ?Wear clean socks or stockings every day. Make sure they are not too tight. Do not wear knee-high stockings since they may decrease blood flow to your legs. ?Wear shoes that fit properly and have enough cushioning. Always look in your shoes before you put them on to be sure there are no objects inside. ?To break in new shoes, wear them for just a few hours a day. This prevents injuries on your feet. ?Wounds,  scrapes, corns, and calluses ? ?Check your feet daily for blisters, cuts, bruises, sores, and redness. If you cannot see the bottom of your feet, use a mirror or ask someone for help. ?Do not cut corns or calluses or try to remove them with medicine. ?If you find a minor scrape, cut, or break in the skin on your feet, keep it and the skin around it clean and dry. You may clean these areas with mild soap and water. Do not clean the area with peroxide, alcohol, or iodine. ?If you have a wound, scrape, corn, or callus on your foot, look at it several times a day to make sure it is healing and not infected. Check for: ?Redness, swelling, or pain. ?Fluid or blood. ?Warmth. ?Pus or a bad smell. ?General tips ?Do not cross your legs. This may decrease blood flow to your feet. ?Do not use heating pads or hot water bottles on your feet. They may burn your skin. If you have lost feeling in your feet or legs, you may not know this is happening until it is too late. ?Protect your feet from hot and cold by wearing shoes, such as at the beach or on hot pavement. ?Schedule a complete foot exam at least once a year (annually) or more often if you have foot problems. Report any cuts, sores, or bruises to your health care provider immediately. ?Where to find more information ?  American Diabetes Association: www.diabetes.org ?Association of Diabetes Care & Education Specialists: www.diabeteseducator.org ?Contact a health care provider if: ?You have a medical condition that increases your risk of infection and you have any cuts, sores, or bruises on your feet. ?You have an injury that is not healing. ?You have redness on your legs or feet. ?You feel burning or tingling in your legs or feet. ?You have pain or cramps in your legs and feet. ?Your legs or feet are numb. ?Your feet always feel cold. ?You have pain around any toenails. ?Get help right away if: ?You have a wound, scrape, corn, or callus on your foot and: ?You have pain,  swelling, or redness that gets worse. ?You have fluid or blood coming from the wound, scrape, corn, or callus. ?Your wound, scrape, corn, or callus feels warm to the touch. ?You have pus or a bad smell coming from the wound, scrape, corn, or callus. ?You have a fever. ?You have a red line going up your leg. ?Summary ?Check your feet every day for blisters, cuts, bruises, sores, and redness. ?Apply a moisturizing lotion or petroleum jelly to the skin on your feet and to dry, brittle toenails. ?Wear shoes that fit properly and have enough cushioning. ?If you have foot problems, report any cuts, sores, or bruises to your health care provider immediately. ?Schedule a complete foot exam at least once a year (annually) or more often if you have foot problems. ?This information is not intended to replace advice given to you by your health care provider. Make sure you discuss any questions you have with your health care provider. ?Document Revised: 01/29/2020 Document Reviewed: 01/29/2020 ?Elsevier Patient Education ? [place]. ? ?

## 2021-10-31 NOTE — Progress Notes (Signed)
? ?Subjective:  ? ? Patient ID: Steven [name], male    DOB: Jul 28, 1945, 76 y.o.   MRN: [id number] ? ?DOS:  10/31/2021 ?Type of visit - description: f/u ? ?Since the last office visit is doing well. ?Reports good medication compliance. ?Saw cardiology, note reviewed. ?Today we also talk about immunizations and feet care. ? ? ?Review of Systems ?Denies chest pain or difficulty breathing ?No lower extremity edema ?No claudication. ?No feet numbness ?Denies dysuria or gross hematuria ? ?Past Medical History:  ?Diagnosis Date  ? Allergy   ? Arthritis   ? Right Hip  ? Atrial fibrillation ([place])   ? BPH (benign prostatic hyperplasia)   ? COPD (chronic obstructive pulmonary disease) ([place])   ? Coronary artery disease   ? Lexiscan Myoview 7/14:  Inf scar with minimal reversibility, inf HK, EF 45%;  Low risk.  Promus LAD stent placed in [place].  100% RCA  ? Diabetes 1.5, managed as type 1 ([place])   ? Diverticulosis   ? on CT  ? Dyslipidemia   ? GERD (gastroesophageal reflux disease)   ? has used Zantac in the past   ? Glaucoma   ? Hammer toes, bilateral   ? Hernia   ? Hyperlipidemia   ? Hypertension   ? Peripheral vascular disease ([place])   ? a. s/p R fem-pop BPG 01/2013 (Dr. Oneida [name])  ? 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 Sickle cell anemia ([name])   ? has trait  ? ? ?Past Surgical History:  ?Procedure Laterality Date  ? ABDOMINAL AORTAGRAM N/A 12/10/2012  ? Procedure: ABDOMINAL AORTAGRAM;  Surgeon: Steven Mitchell, MD;  Location[facility] CATH LAB;  Service: Cardiovascular;  Laterality: N/A;  ? CARDIAC CATHETERIZATION    ? with stent placement  ? FEMORAL-POPLITEAL BYPASS GRAFT Right 02/07/2013  ? Procedure: BYPASS GRAFT FEMORAL-POPLITEAL ARTERY- RIGHT;  Surgeon: Steven Misty, MD;  Location: [facility] OR;  Service: Vascular;  Laterality: Right;  Right femoral to below knee popliteal bypass using right non reversed great saphenous vein  ? INGUINAL HERNIA REPAIR  2010  ? Right  ? INGUINAL HERNIA REPAIR  09/11/2018  ? INTRAOPERATIVE ARTERIOGRAM  Right 02/07/2013  ? Procedure: Attempted INTRA OPERATIVE ARTERIOGRAM;  Surgeon: Steven Misty, MD;  Location: [place];  Service: Vascular;  Laterality: Right;  ? IR RETROGRADE PYELOGRAM  12/10/2019  ? TRANSURETHRAL RESECTION OF PROSTATE  12/10/2019  ? ? ?Current Outpatient Medications  ?Medication Instructions  ? amLODipine (NORVASC) 10 MG tablet TAKE 1 TABLET BY MOUTH  DAILY  ? aspirin EC 81 mg, Daily  ? blood glucose meter kit and supplies Dispense based on patient and insurance preference. Check blood sugars twice daily. Dx: E11.9  ? Blood Glucose Monitoring Suppl (ONETOUCH VERIO IQ SYSTEM) w/Device KIT Use to check blood sugar 2 times per day.  ? Continuous Blood Gluc Sensor (FREESTYLE LIBRE 2 SENSOR) MISC USE AS DIRECTED  ? empagliflozin (JARDIANCE) 25 mg, Oral, Daily before breakfast  ? ferrous sulfate 325 mg, Oral, Daily with breakfast  ? fluticasone (FLONASE) 50 MCG/ACT nasal spray 2 sprays, Each Nare, Daily  ? Insulin Pen Needle 32G X 4 MM MISC 1 Device, Does not apply, Daily  ? latanoprost (XALATAN) 0.005 % ophthalmic solution SMARTSIG:In Eye(s)  ? metFORMIN (GLUCOPHAGE-XR) 500 mg, Oral, Daily with breakfast  ? metoprolol succinate (TOPROL-XL) 100 MG 24 hr tablet TAKE 1 AND 1/2 TABLETS BY MOUTH  DAILY  ? pravastatin (PRAVACHOL) 80 MG tablet TAKE 1 TABLET BY MOUTH IN THE  EVENING  ?  Rybelsus 7 mg, Oral, Daily  ? sucralfate (CARAFATE) 1 g, Oral, 3 times daily with meals & bedtime  ? Toujeo SoloStar 10 Units, Subcutaneous, Daily  ? ? ?   ?Objective:  ? Physical Exam ?BP 136/68 (BP Location: Left Arm, Patient Position: Sitting, Cuff Size: Small)   Pulse 68   Temp 98 ?F (36.7 ?C) (Oral)   Resp 16   Ht '5\' 5"'  (1.651 m)   Wt 135 lb 4 oz (61.3 kg)   SpO2 98%   BMI 22.51 kg/m?  ?General:   ?Well developed, NAD, BMI noted. ?HEENT:  ?Normocephalic . Face symmetric, atraumatic ?Lungs:  ?CTA B ?Normal respiratory effort, no intercostal retractions, no accessory muscle use. ?Heart: RRR,  no murmur.  ? ?DM foot exam:  No edema, pedal pulses slightly diminished but present,+ pre-ulcerative callus found.  Pinprick examination normal ?Next ?Skin: Not pale. Not jaundice ?Neurologic:  ?alert & oriented X3.  ?Speech normal, gait appropriate for age and unassisted ?Psych--  ?Cognition and judgment appear intact.  ?Cooperative with normal attention span and concentration.  ?Behavior appropriate. ?No anxious or depressed appearing.  ? ?   ?Assessment   ? ? ASSESSMENT (new pt to me 12/2019) ?DM, first visit with Endo 09/07/2020 ?HTN ?Hyperlipidemia ?CKD: ?--Creatinine fluctuates 1.70- 1.40 ?--CT November 2021: Normal kidneys and bladder ?CV: ?--CAD, Dr. Percival [name] ?--PAD, fem-pop  2014 ?Sickle cell anemia ?COPD ?BPH, TURP and bilateral retrograde pyelograms 12/10/2019, Dr Steven [name] ? ? ?PLAN ?DM: Per Endo.  Last A1c increased to 8.4. ?DM foot exam: + Preulcerative calluses.  Pinprick examination normal.  [name] a letter for special shoe inserts.  Will do ?HTN: Seems controlled,Continue amlodipine, metoprolol.  Check CBC ?High cholesterol: On Pravachol 80 mg: Check FLP ?CAD: Saw cardiology 08/16/2021, recommended aggressive risk reduction. ?CKD: Last visit with nephrology 11/2020, check CMP ?BPH: Reports no symptoms ?Preventive care: PNM 20 today, feet care discussed ?RTC CPX 04-2022 ? ?This visit occurred during the SARS-CoV-2 public health emergency.  Safety protocols were in place, including screening questions prior to the visit, additional usage of staff PPE, and extensive cleaning of exam room while observing appropriate contact time as indicated for disinfecting solutions.  ? ?

## 2021-10-31 NOTE — Assessment & Plan Note (Signed)
DM: Per Endo.  Last A1c increased to 8.4. ?DM foot exam: + Preulcerative calluses.  Pinprick examination normal.  Needs a letter for special shoe inserts.  Will do ?HTN: Seems controlled,Continue amlodipine, metoprolol.  Check CBC ?High cholesterol: On Pravachol 80 mg: Check FLP ?CAD: Saw cardiology 08/16/2021, recommended aggressive risk reduction. ?CKD: Last visit with nephrology 11/2020, check CMP ?BPH: Reports no symptoms ?Preventive care: PNM 20 today, feet care discussed ?RTC CPX 04-2022 ?

## 2021-11-25 LAB — HEPATIC FUNCTION PANEL
ALT: 12 U/L (ref 10–40)
AST: 18 (ref 14–40)
Alkaline Phosphatase: 83 (ref 25–125)
Bilirubin, Total: 0.4

## 2021-11-25 LAB — BASIC METABOLIC PANEL
BUN: 29 — AB (ref 4–21)
CO2: 23 — AB (ref 13–22)
Chloride: 103 (ref 99–108)
Creatinine: 1.8 — AB (ref 0.6–1.3)
Glucose: 104
Potassium: 3.8 mEq/L (ref 3.5–5.1)
Sodium: 139 (ref 137–147)

## 2021-11-25 LAB — COMPREHENSIVE METABOLIC PANEL
Albumin: 3.9 (ref 3.5–5.0)
Calcium: 9.3 (ref 8.7–10.7)
Globulin: 2.7
eGFR: 41

## 2021-11-25 LAB — PSA: PSA: 2.25

## 2021-11-29 ENCOUNTER — Telehealth: Payer: Self-pay | Admitting: Internal Medicine

## 2021-11-29 NOTE — Telephone Encounter (Signed)
Pt states he was informed that paz would write him a note regarding his diabetic shoes ? ?Please advise  ?

## 2021-11-29 NOTE — Telephone Encounter (Signed)
?  Print this letter: ?Steven [name] is a patient of mine. ?He has diabetes. ?On exam he has preulcerative calluses. ?In my medical opinion he is in need of special shoes for diabetes including possibly specialized shoe inserts. ?Please reach out if you have questions ?

## 2021-11-29 NOTE — Telephone Encounter (Signed)
Letter sent via Mychart. Will call tomorrow to see if he wants to pick up.  ?

## 2021-11-29 NOTE — Telephone Encounter (Signed)
Please advise 

## 2021-11-30 NOTE — Telephone Encounter (Signed)
LMOM informing Pt that letter was sent to [facility]- apologized for delay. Asked that he let us know if he needs [place] to print it and place at front desk for pick up. ?

## 2021-12-02 ENCOUNTER — Other Ambulatory Visit: Payer: Self-pay

## 2021-12-02 DIAGNOSIS — Z794 Long term (current) use of insulin: Secondary | ICD-10-CM

## 2021-12-02 DIAGNOSIS — E1142 Type 2 diabetes mellitus with diabetic polyneuropathy: Secondary | ICD-10-CM

## 2021-12-02 NOTE — Telephone Encounter (Signed)
Referral placed.

## 2021-12-02 NOTE — Telephone Encounter (Signed)
Pt came in today to drop off paperwork. Advised by Vilma Prader that this type of paperwork is better suited for a podiatrist and a referral will be placed. Pt advised.  ?

## 2021-12-20 ENCOUNTER — Ambulatory Visit: Payer: Medicare Other | Admitting: Internal Medicine

## 2022-01-06 ENCOUNTER — Other Ambulatory Visit: Payer: Self-pay | Admitting: Cardiology

## 2022-01-06 ENCOUNTER — Ambulatory Visit (INDEPENDENT_AMBULATORY_CARE_PROVIDER_SITE_OTHER): Payer: Medicare Other | Admitting: Podiatry

## 2022-01-06 ENCOUNTER — Encounter: Payer: Self-pay | Admitting: Podiatry

## 2022-01-06 ENCOUNTER — Telehealth: Payer: Self-pay

## 2022-01-06 DIAGNOSIS — E1142 Type 2 diabetes mellitus with diabetic polyneuropathy: Secondary | ICD-10-CM

## 2022-01-06 DIAGNOSIS — Z794 Long term (current) use of insulin: Secondary | ICD-10-CM

## 2022-01-06 DIAGNOSIS — M2010 Hallux valgus (acquired), unspecified foot: Secondary | ICD-10-CM | POA: Insufficient documentation

## 2022-01-06 DIAGNOSIS — M2042 Other hammer toe(s) (acquired), left foot: Secondary | ICD-10-CM

## 2022-01-06 DIAGNOSIS — M2041 Other hammer toe(s) (acquired), right foot: Secondary | ICD-10-CM | POA: Diagnosis not present

## 2022-01-06 DIAGNOSIS — E785 Hyperlipidemia, unspecified: Secondary | ICD-10-CM

## 2022-01-06 DIAGNOSIS — N182 Chronic kidney disease, stage 2 (mild): Secondary | ICD-10-CM | POA: Diagnosis not present

## 2022-01-06 NOTE — Progress Notes (Addendum)
This patient presents to the office for diabetic foot exam.  Patient has history of vascular surgery right leg.   This patient says there is no pain or discomfort in his feet.  No history of infection or drainage.  This patient presents to the office for foot exam due to having a history of diabetes. Patient has history of DM with neuropathy and CKD.  Vascular  Dorsalis pedis and posterior tibial pulses are absent  B/L.  Capillary return  WNL.  Temperature gradient is  WNL.  Skin turgor  WNL  Sensorium  Senn Weinstein monofilament wire  diminiashed . Diminished tactile sensation.  Nail Exam  Patient has normal nails with no evidence of bacterial or fungal infection.  Orthopedic  Exam  Muscle tone and muscle strength  WNL.  No limitations of motion feet  B/L.  No crepitus or joint effusion noted.  Foot type is unremarkable and digits show no abnormalities.  HAV  B/L.  Skin  No open lesions.  Normal skin texture and turgor.   Diabetes with circulation pathology and diminished neurologic sensation.  Diabetic foot exam was performed.  There is evidence of  both vascular or neurologic pathology. Patient does qualify for diabetic shoes.  Patient to be seen by Aaron Edelman. RTC  prn   Gardiner Barefoot DPM

## 2022-01-06 NOTE — Telephone Encounter (Signed)
Pt called- he is upset (raising voice and interrupting me multiple times throughout phone call) that he was sent to the podiatry office in [place]- he was previously seen in [facility]- chart reviewed- I see where he previously saw Dr. Jacqualyn Posey at [facility] in [place] in 2018- this is why the referral was sent there. I informed if he is already established with [facility]- he should be able to call them and see if they can complete his form for diabetic shoes without an appt or if an appt is needed. Pt verbalized understanding.

## 2022-01-17 ENCOUNTER — Ambulatory Visit (INDEPENDENT_AMBULATORY_CARE_PROVIDER_SITE_OTHER): Payer: Medicare Other | Admitting: Internal Medicine

## 2022-01-17 ENCOUNTER — Encounter: Payer: Self-pay | Admitting: Internal Medicine

## 2022-01-17 VITALS — BP 122/72 | HR 60 | Ht 65.0 in | Wt 131.0 lb

## 2022-01-17 DIAGNOSIS — E1122 Type 2 diabetes mellitus with diabetic chronic kidney disease: Secondary | ICD-10-CM

## 2022-01-17 DIAGNOSIS — Z794 Long term (current) use of insulin: Secondary | ICD-10-CM

## 2022-01-17 DIAGNOSIS — N1831 Chronic kidney disease, stage 3a: Secondary | ICD-10-CM

## 2022-01-17 LAB — POCT GLUCOSE (DEVICE FOR HOME USE): POC Glucose: 173 mg/dl — AB (ref 70–99)

## 2022-01-17 LAB — POCT GLYCOSYLATED HEMOGLOBIN (HGB A1C): Hemoglobin A1C: 7.6 % — AB (ref 4.0–5.6)

## 2022-01-17 MED ORDER — FREESTYLE LIBRE 2 SENSOR MISC: 1.0000 | 3 refills | Status: DC

## 2022-01-17 MED ORDER — TOUJEO SOLOSTAR 300 UNIT/ML ~~LOC~~ SOPN
12.0000 [IU] | PEN_INJECTOR | Freq: Every day | SUBCUTANEOUS | 3 refills | Status: DC
Start: 2022-01-17 — End: 2022-07-27

## 2022-01-17 MED ORDER — RYBELSUS 14 MG PO TABS: 14.0000 mg | ORAL_TABLET | Freq: Every day | ORAL | 3 refills | Status: DC

## 2022-01-19 ENCOUNTER — Telehealth: Payer: Self-pay | Admitting: Internal Medicine

## 2022-01-19 NOTE — Telephone Encounter (Signed)
The patients wife called today 01/19/22  Marcelyn Bruins.  She stated that the [facility] has still not received the form for the patients Diabetic Shoes.' She states they have been going back and forth over the past 5 weeks and [facility] has not received the form. The patients wife asked to speak to the office manager. Sent telelphone msg. To Manager/PCP's CMA .

## 2022-01-26 ENCOUNTER — Ambulatory Visit: Payer: Medicare Other | Admitting: Internal Medicine

## 2022-01-31 ENCOUNTER — Other Ambulatory Visit (HOSPITAL_BASED_OUTPATIENT_CLINIC_OR_DEPARTMENT_OTHER): Payer: Self-pay

## 2022-02-16 ENCOUNTER — Encounter: Payer: Self-pay | Admitting: Internal Medicine

## 2022-02-23 LAB — HM DIABETES EYE EXAM

## 2022-04-08 ENCOUNTER — Other Ambulatory Visit: Payer: Self-pay | Admitting: Internal Medicine

## 2022-04-26 ENCOUNTER — Ambulatory Visit (INDEPENDENT_AMBULATORY_CARE_PROVIDER_SITE_OTHER): Payer: Medicare Other | Admitting: Internal Medicine

## 2022-04-26 ENCOUNTER — Encounter: Payer: Self-pay | Admitting: Internal Medicine

## 2022-04-26 VITALS — BP 126/62 | HR 66 | Temp 97.8°F | Resp 18 | Ht 65.0 in | Wt 131.4 lb

## 2022-04-26 DIAGNOSIS — Z794 Long term (current) use of insulin: Secondary | ICD-10-CM | POA: Diagnosis not present

## 2022-04-26 DIAGNOSIS — Z0000 Encounter for general adult medical examination without abnormal findings: Secondary | ICD-10-CM | POA: Diagnosis not present

## 2022-04-26 DIAGNOSIS — I10 Essential (primary) hypertension: Secondary | ICD-10-CM

## 2022-04-26 DIAGNOSIS — I70219 Atherosclerosis of native arteries of extremities with intermittent claudication, unspecified extremity: Secondary | ICD-10-CM | POA: Diagnosis not present

## 2022-04-26 DIAGNOSIS — E785 Hyperlipidemia, unspecified: Secondary | ICD-10-CM

## 2022-04-26 DIAGNOSIS — E1142 Type 2 diabetes mellitus with diabetic polyneuropathy: Secondary | ICD-10-CM

## 2022-04-26 DIAGNOSIS — Z23 Encounter for immunization: Secondary | ICD-10-CM

## 2022-04-26 LAB — LIPID PANEL
Cholesterol: 171 mg/dL (ref 0–200)
HDL: 61.5 mg/dL (ref >39.00)
LDL Cholesterol: 87 mg/dL (ref 0–99)
NonHDL: 109.28
Total CHOL/HDL Ratio: 3
Triglycerides: 109 mg/dL (ref 0.0–149.0)
VLDL: 21.8 mg/dL (ref 0.0–40.0)

## 2022-04-26 LAB — BASIC METABOLIC PANEL
BUN: 20 mg/dL (ref 6–23)
CO2: 27 mEq/L (ref 19–32)
Calcium: 9.6 mg/dL (ref 8.4–10.5)
Chloride: 103 mEq/L (ref 96–112)
Creatinine, Ser: 1.71 mg/dL — ABNORMAL HIGH (ref 0.40–1.50)
GFR: 39.07 mL/min — ABNORMAL LOW (ref >60.00)
Glucose, Bld: 235 mg/dL — ABNORMAL HIGH (ref 70–99)
Potassium: 4.1 mEq/L (ref 3.5–5.1)
Sodium: 139 mEq/L (ref 135–145)

## 2022-04-26 LAB — MICROALBUMIN / CREATININE URINE RATIO
Creatinine,U: 25.3 mg/dL
Microalb Creat Ratio: 48.3 mg/g — ABNORMAL HIGH (ref 0.0–30.0)
Microalb, Ur: 12.2 mg/dL — ABNORMAL HIGH (ref 0.0–1.9)

## 2022-04-26 LAB — TSH: TSH: 1.83 u[IU]/mL (ref 0.35–5.50)

## 2022-04-26 MED ORDER — IPRATROPIUM BROMIDE 0.03 % NA SOLN: 2.0000 | Freq: Two times a day (BID) | NASAL | 12 refills | Status: DC

## 2022-04-26 NOTE — Progress Notes (Signed)
 Subjective:    Patient ID: Steven [name], male    DOB: 04/30/1946, 76 y.o.   MRN: [id number]  DOS:  04/26/2022 Type of visit - description: cpx  Here for CPX. No major complaints Denies chest pain or difficulty breathing Occasional pain of the right hand without swelling. Continue with "constant" clear nasal discharge, worse when he eats.   Review of Systems  Other than above, a 14 point review of systems is negative    Past Medical History:  Diagnosis Date   Allergy    Arthritis    Right Hip   Atrial fibrillation ([name])    BPH (benign prostatic hyperplasia)    COPD (chronic obstructive pulmonary disease) ([name])    Coronary artery disease    Lexiscan Myoview 7/14:  Inf scar with minimal reversibility, inf HK, EF 45%;  Low risk.  Promus LAD stent placed in [place].  100% RCA   Diabetes 1.5, managed as type 1 ([name])    Diverticulosis    on CT   Dyslipidemia    GERD (gastroesophageal reflux disease)    has used Zantac in the past    Glaucoma    Hammer toes, bilateral    Hernia    Hyperlipidemia    Hypertension    Peripheral vascular disease ([name])    a. s/p R fem-pop BPG 01/2013 (Dr. 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    Past Surgical History:  Procedure Laterality Date   ABDOMINAL AORTAGRAM N/A 12/10/2012   Procedure: ABDOMINAL AORTAGRAM;  Surgeon: Vance W Brabham, MD;  Location: [facility] CATH LAB;  Service: Cardiovascular;  Laterality: N/A;   CARDIAC CATHETERIZATION     with stent placement   FEMORAL-POPLITEAL BYPASS GRAFT Right 02/07/2013   Procedure: BYPASS GRAFT FEMORAL-POPLITEAL ARTERY- RIGHT;  Surgeon: James D Lawson, MD;  Location: [facility] OR;  Service: Vascular;  Laterality: Right;  Right femoral to below knee popliteal bypass using right non reversed great saphenous vein   INGUINAL HERNIA REPAIR  2010   Right   INGUINAL HERNIA REPAIR  09/11/2018   INTRAOPERATIVE ARTERIOGRAM Right 02/07/2013   Procedure: Attempted  INTRA OPERATIVE ARTERIOGRAM;  Surgeon: James D Lawson, MD;  Location: [facility] OR;  Service: Vascular;  Laterality: Right;   IR RETROGRADE PYELOGRAM  12/10/2019   TRANSURETHRAL RESECTION OF PROSTATE  12/10/2019   Social History   Socioeconomic History   Marital status: Married    Spouse name: Not on file   Number of children: 5   Years of education: Not on file   Highest education level: Not on file  Occupational History   Occupation: retired, works part time, driver   Occupation: [employer] [place]  Tobacco Use   Smoking status: Former    Packs/day: 0.30    Years: 50.00    Total pack years: 15.00    Types: Cigarettes    Quit date: 02/04/2011    Years since quitting: 11.2    Passive exposure: Never   Smokeless tobacco: Never  Vaping Use   Vaping Use: Never used  Substance and Sexual Activity   Alcohol use: Not Currently   Drug use: No   Sexual activity: Not on file  Other Topics Concern   Not on file  Social History Narrative   5 children , lost a son    Lives with wife.     Social Determinants of Health   Financial Resource Strain: Low Risk  (06/20/2021)     Overall Financial Resource Strain (CARDIA)    Difficulty of Paying Living Expenses: Not hard at all  Food Insecurity: No Food Insecurity (06/20/2021)   Hunger Vital Sign    Worried About Running Out of Food in the Last Year: Never true    Ran Out of Food in the Last Year: Never true  Transportation Needs: No Transportation Needs (06/20/2021)   PRAPARE - Transportation    Lack of Transportation (Medical): No    Lack of Transportation (Non-Medical): No  Physical Activity: Inactive (06/20/2021)   Exercise Vital Sign    Days of Exercise per Week: 0 days    Minutes of Exercise per Session: 0 min  Stress: No Stress Concern Present (06/20/2021)   [employer] - Occupational Stress Questionnaire    Feeling of Stress : Not at all  Social Connections: Socially Integrated (06/20/2021)    Social Connection and Isolation Panel [NHANES]    Frequency of Communication with Friends and Family: More than three times a week    Frequency of Social Gatherings with Friends and Family: Once a week    Attends Religious Services: More than 4 times per year    Active Member of Clubs or Organizations: Yes    Attends Club or Organization Meetings: More than 4 times per year    Marital Status: Married  Intimate Partner Violence: Not At Risk (06/20/2021)   Humiliation, Afraid, Rape, and Kick questionnaire    Fear of Current or Ex-Partner: No    Emotionally Abused: No    Physically Abused: No    Sexually Abused: No    Current Outpatient Medications  Medication Instructions   amLODipine (NORVASC) 10 mg, Oral, Daily   aspirin EC 81 mg, Daily   blood glucose meter kit and supplies Dispense based on patient and insurance preference. Check blood sugars twice daily. Dx: E11.9   Blood Glucose Monitoring Suppl (ONETOUCH VERIO IQ SYSTEM) w/Device KIT Use to check blood sugar 2 times per day.   Continuous Blood Gluc Sensor (FREESTYLE LIBRE 2 SENSOR) MISC 1 Device, Subcutaneous, Every 14 days   empagliflozin (JARDIANCE) 25 mg, Oral, Daily before breakfast   fluticasone (FLONASE) 50 MCG/ACT nasal spray 2 sprays, Each Nare, Daily   Insulin Pen Needle 32G X 4 MM MISC 1 Device, Does not apply, Daily   ipratropium (ATROVENT) 0.03 % nasal spray 2 sprays, Each Nare, Every 12 hours   latanoprost (XALATAN) 0.005 % ophthalmic solution SMARTSIG:In Eye(s)   metFORMIN (GLUCOPHAGE-XR) 500 mg, Oral, Daily with breakfast   metoprolol succinate (TOPROL-XL) 100 MG 24 hr tablet TAKE 1 AND 1/2 TABLETS BY MOUTH  DAILY   pravastatin (PRAVACHOL) 80 MG tablet TAKE 1 TABLET BY MOUTH IN THE  EVENING   Rybelsus 14 mg, Oral, Daily   sucralfate (CARAFATE) 1 g, Oral, 3 times daily with meals & bedtime   Toujeo SoloStar 12 Units, Subcutaneous, Daily       Objective:   Physical Exam BP 126/62   Pulse 66   Temp 97.8 F  (36.6 C) (Oral)   Resp 18   Ht 5' 5" (1.651 m)   Wt 131 lb 6 oz (59.6 kg)   SpO2 99%   BMI 21.86 kg/m  General: Well developed, NAD, BMI noted Neck: No  thyromegaly  HEENT:  Normocephalic . Face symmetric, atraumatic.  Nose slightly congested Lungs:  CTA B Normal respiratory effort, no intercostal retractions, no accessory muscle use. Heart: RRR,  no murmur.  Abdomen:  Not distended, soft, non-tender. No   rebound or rigidity.   Lower extremities: no pretibial edema bilaterally  MSK: Hands without synovitis, some changes of DJD Skin: Exposed areas without rash. Not pale. Not jaundice Neurologic:  alert & oriented X3.  Speech normal, gait appropriate for age and unassisted Strength symmetric and appropriate for age.  Psych: Cognition and judgment appear intact.  Cooperative with normal attention span and concentration.  Behavior appropriate. No anxious or depressed appearing.     Assessment    ASSESSMENT (new pt to me 12/2019) DM, first visit with Endo 09/07/2020 HTN Hyperlipidemia CKD: --Creatinine fluctuates 1.70- 1.40 --CT November 2021: Normal kidneys and bladder CV: --CAD, Dr. Percival Spanish --PAD, fem-pop  2014 Sickle cell anemia COPD BPH, TURP and bilateral retrograde pyelograms 12/10/2019, Dr Hazle Nordmann   PLAN Here for CPX DM: Per Endo HTN: BP is very good, recommend to monitor her at home.  Continue Metoprolol, amlodipine.  Checking labs. High cholesterol: On pravastatin, h/o problems tolerating statins,  recheck FLP.  Rechallenge with Zetia? CKD: Checking labs BPH, TURP: Reportedly seen urology recently we will try to get records. Rhinitis: Likely vasomotor, not responding to Astepro.  Switch to ipratropium. Arthritis: Occasional hand pain, without swelling.  No synovitis on exam, likely DJD, Rx Tylenol and observation RTC 4 months

## 2022-04-26 NOTE — Assessment & Plan Note (Signed)
Here for CPX DM: Per Endo HTN: BP is very good, recommend to monitor her at home.  Continue Metoprolol, amlodipine.  Checking labs. High cholesterol: On pravastatin, h/o problems tolerating statins,  recheck FLP.  Rechallenge with Zetia? CKD: Checking labs BPH, TURP: Reportedly seen urology recently we will try to get records. Rhinitis: Likely vasomotor, not responding to Astepro.  Switch to ipratropium. Arthritis: Occasional hand pain, without swelling.  No synovitis on exam, likely DJD, Rx Tylenol and observation RTC 4 months

## 2022-04-26 NOTE — Patient Instructions (Addendum)
Vaccines I recommend:  Covid booster Shingrix (shingles) RSV vaccine  For nasal congestion: Ipratropium 2 sprays twice daily every day.  GO TO THE LAB : Get the blood work     [place], [place] Come back for a checkup in 4 months       Advanced care planning:  Do you have a "Living will", "Isabela of attorney" ?   If you already have a living will or healthcare power of attorney, is recommended you bring the copy to be scanned in your chart. The document will be available to all the doctors you see in the system.  If you don't have one, please consider create one.  Advance care planning is a process that supports adults in  understanding and sharing their preferences regarding future medical care.   The patient's preferences are recorded in documents called Advance Directives.    Advanced directives are completed (and can be modified at any time) while the patient is in full mental capacity.   The documentation should be available at all times to the patient, the family and the healthcare providers.   This legal documents direct treatment decision making and/or appoint a surrogate to make the decision if the patient is not capable to do so.    Advance directives can be documented in many types of formats,  documents have names such as:  Lliving will  Durable power of attorney for healthcare (healthcare proxy or healthcare power of attorney)  Combined directives  Physician orders for life-sustaining treatment    More information at:  [email or web address]

## 2022-04-26 NOTE — Assessment & Plan Note (Signed)
Tdap 2014 PNM 13 -- 2016 PNM 23 -- 2014 PNM 20: 2023 S/p zostavax Shingrex recommended  RSV d/w pt COVID VAX booster rec  Flu shot today  CCS: Colonoscopy 2019, next 5 years  per GI letter  Prostate cancer screening: saw urology  recently,  per pt, will get records Labs:  BMP, FLP, TSH POA d/w pt

## 2022-04-28 MED ORDER — EZETIMIBE 10 MG PO TABS: 10.0000 mg | ORAL_TABLET | Freq: Every day | ORAL | 1 refills | Status: DC

## 2022-04-28 NOTE — Addendum Note (Signed)
Addended byDamita Dunnings D on: 04/28/2022 11:24 AM   Modules accepted: Orders

## 2022-05-03 ENCOUNTER — Telehealth: Payer: Self-pay

## 2022-05-03 NOTE — Telephone Encounter (Signed)
Medical records received from Dr. Harlow Asa. Records placed in PCP red folder for review.

## 2022-05-04 NOTE — Telephone Encounter (Addendum)
Seen by urology 11/2021 and 03/21/2022, felt to be stable. Labs from May 2023: Creatinine 1.7.  PSA 2.25.

## 2022-06-20 ENCOUNTER — Encounter: Payer: Self-pay | Admitting: Family Medicine

## 2022-06-20 ENCOUNTER — Ambulatory Visit (INDEPENDENT_AMBULATORY_CARE_PROVIDER_SITE_OTHER): Payer: Medicare Other | Admitting: Family Medicine

## 2022-06-20 VITALS — BP 118/70 | HR 68 | Temp 98.3°F | Ht 65.0 in | Wt 133.5 lb

## 2022-06-20 DIAGNOSIS — G8929 Other chronic pain: Secondary | ICD-10-CM | POA: Diagnosis not present

## 2022-06-20 DIAGNOSIS — M545 Low back pain, unspecified: Secondary | ICD-10-CM | POA: Diagnosis not present

## 2022-06-20 DIAGNOSIS — J309 Allergic rhinitis, unspecified: Secondary | ICD-10-CM | POA: Diagnosis not present

## 2022-06-20 MED ORDER — FLUTICASONE PROPIONATE 50 MCG/ACT NA SUSP: 2.0000 | Freq: Every day | NASAL | 3 refills | Status: DC

## 2022-06-20 MED ORDER — LEVOCETIRIZINE DIHYDROCHLORIDE 5 MG PO TABS: 5.0000 mg | ORAL_TABLET | Freq: Every evening | ORAL | 2 refills | Status: DC

## 2022-06-20 NOTE — Patient Instructions (Addendum)
Stay on the ipratropium twice daily. Add back Flonase for 2 weeks. If still having a runny nose, add the levocetirizine/Xyzal daily.   Claritin (loratadine), Allegra (fexofenadine), Zyrtec (cetirizine) which is also equivalent to Xyzal (levocetirizine); these are listed in order from weakest to strongest. Generic, and therefore cheaper, options are in the parentheses.   Flonase (fluticasone); nasal spray that is over the counter. 2 sprays each nostril, once daily. Aim towards the same side eye when you spray.  There are available OTC, and the generic versions, which may be cheaper, are in parentheses. Show this to a pharmacist if you have trouble finding any of these items.  Let us know if you need anything.  EXERCISES  RANGE OF MOTION (ROM) AND STRETCHING EXERCISES - Low Back Pain Most people with lower back pain will find that their symptoms get worse with excessive bending forward (flexion) or arching at the lower back (extension). The exercises that will help resolve your symptoms will focus on the opposite motion.  If you have pain, numbness or tingling which travels down into your buttocks, leg or foot, the goal of the therapy is for these symptoms to move closer to your back and eventually resolve. Sometimes, these leg symptoms will get better, but your lower back pain may worsen. This is often an indication of progress in your rehabilitation. Be very alert to any changes in your symptoms and the activities in which you participated in the 24 hours prior to the change. Sharing this information with your caregiver will allow him or her to most efficiently treat your condition. These exercises may help you when beginning to rehabilitate your injury. Your symptoms may resolve with or without further involvement from your physician, physical therapist or athletic trainer. While completing these exercises, remember:  Restoring tissue flexibility helps normal motion to return to the joints. This  allows healthier, less painful movement and activity. An effective stretch should be held for at least 30 seconds. A stretch should never be painful. You should only feel a gentle lengthening or release in the stretched tissue. FLEXION RANGE OF MOTION AND STRETCHING EXERCISES:  STRETCH - Flexion, Single Knee to Chest  Lie on a firm bed or floor with both legs extended in front of you. Keeping one leg in contact with the floor, bring your opposite knee to your chest. Hold your leg in place by either grabbing behind your thigh or at your knee. Pull until you feel a gentle stretch in your low back. Hold 30 seconds. Slowly release your grasp and repeat the exercise with the opposite side. Repeat 2 times. Complete this exercise 3 times per week.   STRETCH - Flexion, Double Knee to Chest Lie on a firm bed or floor with both legs extended in front of you. Keeping one leg in contact with the floor, bring your opposite knee to your chest. Tense your stomach muscles to support your back and then lift your other knee to your chest. Hold your legs in place by either grabbing behind your thighs or at your knees. Pull both knees toward your chest until you feel a gentle stretch in your low back. Hold 30 seconds. Tense your stomach muscles and slowly return one leg at a time to the floor. Repeat 2 times. Complete this exercise 3 times per week.   STRETCH - Low Trunk Rotation Lie on a firm bed or floor. Keeping your legs in front of you, bend your knees so they are both pointed toward the ceiling  and your feet are flat on the floor. Extend your arms out to the side. This will stabilize your upper body by keeping your shoulders in contact with the floor. Gently and slowly drop both knees together to one side until you feel a gentle stretch in your low back. Hold for 30 seconds. Tense your stomach muscles to support your lower back as you bring your knees back to the starting position. Repeat the exercise to  the other side. Repeat 2 times. Complete this exercise at least 3 times per week.   EXTENSION RANGE OF MOTION AND FLEXIBILITY EXERCISES:  STRETCH - Extension, Prone on Elbows  Lie on your stomach on the floor, a bed will be too soft. Place your palms about shoulder width apart and at the height of your head. Place your elbows under your shoulders. If this is too painful, stack pillows under your chest. Allow your body to relax so that your hips drop lower and make contact more completely with the floor. Hold this position for 30 seconds. Slowly return to lying flat on the floor. Repeat 2 times. Complete this exercise 3 times per week.   RANGE OF MOTION - Extension, Prone Press Ups Lie on your stomach on the floor, a bed will be too soft. Place your palms about shoulder width apart and at the height of your head. Keeping your back as relaxed as possible, slowly straighten your elbows while keeping your hips on the floor. You may adjust the placement of your hands to maximize your comfort. As you gain motion, your hands will come more underneath your shoulders. Hold this position 30 seconds. Slowly return to lying flat on the floor. Repeat 2 times. Complete this exercise 3 times per week.   RANGE OF MOTION- Quadruped, Neutral Spine  Assume a hands and knees position on a firm surface. Keep your hands under your shoulders and your knees under your hips. You may place padding under your knees for comfort. Drop your head and point your tailbone toward the ground below you. This will round out your lower back like an angry cat. Hold this position for 30 seconds. Slowly lift your head and release your tail bone so that your back sags into a large arch, like an old horse. Hold this position for 30 seconds. Repeat this until you feel limber in your low back. Now, find your "sweet spot." This will be the most comfortable position somewhere between the two previous positions. This is your neutral  spine. Once you have found this position, tense your stomach muscles to support your low back. Hold this position for 30 seconds. Repeat 2 times. Complete this exercise 3 times per week.   STRENGTHENING EXERCISES - Low Back Sprain These exercises may help you when beginning to rehabilitate your injury. These exercises should be done near your "sweet spot." This is the neutral, low-back arch, somewhere between fully rounded and fully arched, that is your least painful position. When performed in this safe range of motion, these exercises can be used for people who have either a flexion or extension based injury. These exercises may resolve your symptoms with or without further involvement from your physician, physical therapist or athletic trainer. While completing these exercises, remember:  Muscles can gain both the endurance and the strength needed for everyday activities through controlled exercises. Complete these exercises as instructed by your physician, physical therapist or athletic trainer. Increase the resistance and repetitions only as guided. You may experience muscle soreness or fatigue,  but the pain or discomfort you are trying to eliminate should never worsen during these exercises. If this pain does worsen, stop and make certain you are following the directions exactly. If the pain is still present after adjustments, discontinue the exercise until you can discuss the trouble with your caregiver.  STRENGTHENING - Deep Abdominals, Pelvic Tilt  Lie on a firm bed or floor. Keeping your legs in front of you, bend your knees so they are both pointed toward the ceiling and your feet are flat on the floor. Tense your lower abdominal muscles to press your low back into the floor. This motion will rotate your pelvis so that your tail bone is scooping upwards rather than pointing at your feet or into the floor. With a gentle tension and even breathing, hold this position for 3 seconds. Repeat 2  times. Complete this exercise 3 times per week.   STRENGTHENING - Abdominals, Crunches  Lie on a firm bed or floor. Keeping your legs in front of you, bend your knees so they are both pointed toward the ceiling and your feet are flat on the floor. Cross your arms over your chest. Slightly tip your chin down without bending your neck. Tense your abdominals and slowly lift your trunk high enough to just clear your shoulder blades. Lifting higher can put excessive stress on the lower back and does not further strengthen your abdominal muscles. Control your return to the starting position. Repeat 2 times. Complete this exercise 3 times per week.   STRENGTHENING - Quadruped, Opposite UE/LE Lift  Assume a hands and knees position on a firm surface. Keep your hands under your shoulders and your knees under your hips. You may place padding under your knees for comfort. Find your neutral spine and gently tense your abdominal muscles so that you can maintain this position. Your shoulders and hips should form a rectangle that is parallel with the floor and is not twisted. Keeping your trunk steady, lift your right hand no higher than your shoulder and then your left leg no higher than your hip. Make sure you are not holding your breath. Hold this position for 30 seconds. Continuing to keep your abdominal muscles tense and your back steady, slowly return to your starting position. Repeat with the opposite arm and leg. Repeat 2 times. Complete this exercise 3 times per week.   STRENGTHENING - Abdominals and Quadriceps, Straight Leg Raise  Lie on a firm bed or floor with both legs extended in front of you. Keeping one leg in contact with the floor, bend the other knee so that your foot can rest flat on the floor. Find your neutral spine, and tense your abdominal muscles to maintain your spinal position throughout the exercise. Slowly lift your straight leg off the floor about 6 inches for a count of 3, making  sure to not hold your breath. Still keeping your neutral spine, slowly lower your leg all the way to the floor. Repeat this exercise with each leg 2 times. Complete this exercise 3 times per week.  POSTURE AND BODY MECHANICS CONSIDERATIONS - Low Back Sprain Keeping correct posture when sitting, standing or completing your activities will reduce the stress put on different body tissues, allowing injured tissues a chance to heal and limiting painful experiences. The following are general guidelines for improved posture.  While reading these guidelines, remember: The exercises prescribed by your provider will help you have the flexibility and strength to maintain correct postures. The correct posture provides  the best environment for your joints to work. All of your joints have less wear and tear when properly supported by a spine with good posture. This means you will experience a healthier, less painful body. Correct posture must be practiced with all of your activities, especially prolonged sitting and standing. Correct posture is as important when doing repetitive low-stress activities (typing) as it is when doing a single heavy-load activity (lifting).  RESTING POSITIONS Consider which positions are most painful for you when choosing a resting position. If you have pain with flexion-based activities (sitting, bending, stooping, squatting), choose a position that allows you to rest in a less flexed posture. You would want to avoid curling into a fetal position on your side. If your pain worsens with extension-based activities (prolonged standing, working overhead), avoid resting in an extended position such as sleeping on your stomach. Most people will find more comfort when they rest with their spine in a more neutral position, neither too rounded nor too arched. Lying on a non-sagging bed on your side with a pillow between your knees, or on your back with a pillow under your knees will often provide  some relief. Keep in mind, being in any one position for a prolonged period of time, no matter how correct your posture, can still lead to stiffness.  PROPER SITTING POSTURE In order to minimize stress and discomfort on your spine, you must sit with correct posture. Sitting with good posture should be effortless for a healthy body. Returning to good posture is a gradual process. Many people can work toward this most comfortably by using various supports until they have the flexibility and strength to maintain this posture on their own. When sitting with proper posture, your ears will fall over your shoulders and your shoulders will fall over your hips. You should use the back of the chair to support your upper back. Your lower back will be in a neutral position, just slightly arched. You may place a small pillow or folded towel at the base of your lower back for  support.  When working at a desk, create an environment that supports good, upright posture. Without extra support, muscles tire, which leads to excessive strain on joints and other tissues. Keep these recommendations in mind:  CHAIR: A chair should be able to slide under your desk when your back makes contact with the back of the chair. This allows you to work closely. The chair's height should allow your eyes to be level with the upper part of your monitor and your hands to be slightly lower than your elbows.  BODY POSITION Your feet should make contact with the floor. If this is not possible, use a foot rest. Keep your ears over your shoulders. This will reduce stress on your neck and low back.  INCORRECT SITTING POSTURES  If you are feeling tired and unable to assume a healthy sitting posture, do not slouch or slump. This puts excessive strain on your back tissues, causing more damage and pain. Healthier options include: Using more support, like a lumbar pillow. Switching tasks to something that requires you to be upright or  walking. Talking a brief walk. Lying down to rest in a neutral-spine position.  PROLONGED STANDING WHILE SLIGHTLY LEANING FORWARD  When completing a task that requires you to lean forward while standing in one place for a long time, place either foot up on a stationary 2-4 inch high object to help maintain the best posture. When both feet are  on the ground, the lower back tends to lose its slight inward curve. If this curve flattens (or becomes too large), then the back and your other joints will experience too much stress, tire more quickly, and can cause pain.  CORRECT STANDING POSTURES Proper standing posture should be assumed with all daily activities, even if they only take a few moments, like when brushing your teeth. As in sitting, your ears should fall over your shoulders and your shoulders should fall over your hips. You should keep a slight tension in your abdominal muscles to brace your spine. Your tailbone should point down to the ground, not behind your body, resulting in an over-extended swayback posture.   INCORRECT STANDING POSTURES  Common incorrect standing postures include a forward head, locked knees and/or an excessive swayback. WALKING Walk with an upright posture. Your ears, shoulders and hips should all line-up.  PROLONGED ACTIVITY IN A FLEXED POSITION When completing a task that requires you to bend forward at your waist or lean over a low surface, try to find a way to stabilize 3 out of 4 of your limbs. You can place a hand or elbow on your thigh or rest a knee on the surface you are reaching across. This will provide you more stability, so that your muscles do not tire as quickly. By keeping your knees relaxed, or slightly bent, you will also reduce stress across your lower back. CORRECT LIFTING TECHNIQUES  DO : Assume a wide stance. This will provide you more stability and the opportunity to get as close as possible to the object which you are lifting. Tense your  abdominals to brace your spine. Bend at the knees and hips. Keeping your back locked in a neutral-spine position, lift using your leg muscles. Lift with your legs, keeping your back straight. Test the weight of unknown objects before attempting to lift them. Try to keep your elbows locked down at your sides in order get the best strength from your shoulders when carrying an object.   Always ask for help when lifting heavy or awkward objects. INCORRECT LIFTING TECHNIQUES DO NOT:  Lock your knees when lifting, even if it is a small object. Bend and twist. Pivot at your feet or move your feet when needing to change directions. Assume that you can safely pick up even a paperclip without proper posture.

## 2022-06-20 NOTE — Progress Notes (Signed)
Chief Complaint  Patient presents with   Nasal Congestion    Steven [name] here for URI complaints.  Duration: 1 year  Associated symptoms: rhinorrhea Denies: sinus congestion, sinus pain, itchy watery eyes, ear pain, ear drainage, sore throat, wheezing, shortness of breath, myalgia, and fevers, coughing Treatment to date: INCS, ipratropium nasal spray. Not helpful.   Chronic history of right-sided low back pain.  No recent injury or change activity.  No neurologic signs or symptoms.  Denies bruising, redness, swelling.  Past Medical History:  Diagnosis Date   Allergy    Arthritis    Right Hip   Atrial fibrillation ([name])    BPH (benign prostatic hyperplasia)    COPD (chronic obstructive pulmonary disease) ([place])    Coronary artery disease    Lexiscan Myoview 7/14:  Inf scar with minimal reversibility, inf HK, EF 45%;  Low risk.  Promus LAD stent placed in [place].  100% RCA   Diabetes 1.5, managed as type 1 ([place])    Diverticulosis    on CT   Dyslipidemia    GERD (gastroesophageal reflux disease)    has used Zantac in the past    Glaucoma    Hammer toes, bilateral    Hernia    Hyperlipidemia    Hypertension    Peripheral vascular disease ([place])    a. s/p R fem-pop BPG 01/2013 (Dr. 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    Objective BP 118/70 (BP Location: Right Arm, Patient Position: Sitting, Cuff Size: Normal)   Pulse 68   Temp 98.3 F (36.8 C) (Oral)   Ht '5\' 5"'$  (1.651 m)   Wt 133 lb 8 oz (60.6 kg)   SpO2 99%   BMI 22.22 kg/m  General: Awake, alert, appears stated age HEENT: AT, [place], ears patent b/l and TM's neg, nares patent w clear discharge, pharynx pink and without exudates, MMM Neck: No masses or asymmetry Heart: RRR Lungs: CTAB, no accessory muscle use MSK: TTP over the lumbar paraspinal musculature on the right, no midline tenderness, poor hamstring flexibility bilaterally Neuro: Negative straight leg, DTRs  equal and symmetric in lower extremities, no clonus, no cerebellar signs, gait is normal Psych: Age appropriate judgment and insight, normal mood and affect  Allergic rhinitis, unspecified seasonality, unspecified trigger - Plan: levocetirizine (XYZAL) 5 MG tablet, fluticasone (FLONASE) 50 MCG/ACT nasal spray  Chronic right-sided low back pain without sciatica  Cont ipratropium. Go back on INCS. If no improvement in 2 weeks, will add Xyzal. F/u w Dr. Larose Kells in 5 weeks to reck.  Heat, ice, Tylenol, stretches and exercises.  Will consider physical therapy if no improvement in the next month. Pt voiced understanding and agreement to the plan.  [place], DO 06/20/22 3:17 PM

## 2022-06-22 ENCOUNTER — Ambulatory Visit: Payer: Medicare Other

## 2022-06-27 ENCOUNTER — Ambulatory Visit (INDEPENDENT_AMBULATORY_CARE_PROVIDER_SITE_OTHER): Payer: Medicare Other | Admitting: *Deleted

## 2022-06-27 DIAGNOSIS — Z0000 Encounter for general adult medical examination without abnormal findings: Secondary | ICD-10-CM

## 2022-06-27 NOTE — Patient Instructions (Signed)
[name] , Thank you for taking time to come for your Medicare Wellness Visit. I appreciate your ongoing commitment to your health goals. Please review the following plan we discussed and let me know if I can assist you in the future.   These are the goals we discussed:  Goals      maintain current health        This is a list of the screening recommended for you and due dates:  Health Maintenance  Topic Date Due   Zoster (Shingles) Vaccine (1 of 2) Never done   COVID-19 Vaccine (6 - 2023-24 season) 03/24/2022   Hemoglobin A1C  07/19/2022   Eye exam for diabetics  07/21/2022   Colon Cancer Screening  12/20/2022   Complete foot exam   01/18/2023   Yearly kidney function blood test for diabetes  04/27/2023   Yearly kidney health urinalysis for diabetes  04/27/2023   DTaP/Tdap/Td vaccine (2 - Td or Tdap) 05/23/2023   Medicare Annual Wellness Visit  06/28/2023   Pneumonia Vaccine  Completed   Flu Shot  Completed   Hepatitis C Screening: USPSTF Recommendation to screen - Ages 18-79 yo.  Completed   HPV Vaccine  Aged Out     Next appointment: Follow up in one year for your annual wellness visit.   Preventive Care 2 Years and Older, Male Preventive care refers to lifestyle choices and visits with your health care provider that can promote health and wellness. What does preventive care include? A yearly physical exam. This is also called an annual well check. Dental exams once or twice a year. Routine eye exams. Ask your health care provider how often you should have your eyes checked. Personal lifestyle choices, including: Daily care of your teeth and gums. Regular physical activity. Eating a healthy diet. Avoiding tobacco and drug use. Limiting alcohol use. Practicing safe sex. Taking low doses of aspirin every day. Taking vitamin and mineral supplements as recommended by your health care provider. What happens during an annual well check? The services and screenings  done by your health care provider during your annual well check will depend on your age, overall health, lifestyle risk factors, and family history of disease. Counseling  Your health care provider may ask you questions about your: Alcohol use. Tobacco use. Drug use. Emotional well-being. Home and relationship well-being. Sexual activity. Eating habits. History of falls. Memory and ability to understand (cognition). Work and work Statistician. Screening  You may have the following tests or measurements: Height, weight, and BMI. Blood pressure. Lipid and cholesterol levels. These may be checked every 5 years, or more frequently if you are over 81 years old. Skin check. Lung cancer screening. You may have this screening every year starting at age 8 if you have a 30-pack-year history of smoking and currently smoke or have quit within the past 15 years. Fecal occult blood test (FOBT) of the stool. You may have this test every year starting at age 63. Flexible sigmoidoscopy or colonoscopy. You may have a sigmoidoscopy every 5 years or a colonoscopy every 10 years starting at age 39. Prostate cancer screening. Recommendations will vary depending on your family history and other risks. Hepatitis C blood test. Hepatitis B blood test. Sexually transmitted disease (STD) testing. Diabetes screening. This is done by checking your blood sugar (glucose) after you have not eaten for a while (fasting). You may have this done every 1-3 years. Abdominal aortic aneurysm (AAA) screening. You may need this if you are  a current or former smoker. Osteoporosis. You may be screened starting at age 58 if you are at high risk. Talk with your health care provider about your test results, treatment options, and if necessary, the need for more tests. Vaccines  Your health care provider may recommend certain vaccines, such as: Influenza vaccine. This is recommended every year. Tetanus, diphtheria, and acellular  pertussis (Tdap, Td) vaccine. You may need a Td booster every 10 years. Zoster vaccine. You may need this after age 26. Pneumococcal 13-valent conjugate (PCV13) vaccine. One dose is recommended after age 62. Pneumococcal polysaccharide (PPSV23) vaccine. One dose is recommended after age 29. Talk to your health care provider about which screenings and vaccines you need and how often you need them. This information is not intended to replace advice given to you by your health care provider. Make sure you discuss any questions you have with your health care provider. Document Released: 08/06/2015 Document Revised: 03/29/2016 Document Reviewed: 05/11/2015 Elsevier Interactive Patient Education  2017 [place] Prevention in the Home Falls can cause injuries. They can happen to people of all ages. There are many things you can do to make your home safe and to help prevent falls. What can I do on the outside of my home? Regularly fix the edges of walkways and driveways and fix any cracks. Remove anything that might make you trip as you walk through a door, such as a raised step or threshold. Trim any bushes or trees on the path to your home. Use bright outdoor lighting. Clear any walking paths of anything that might make someone trip, such as rocks or tools. Regularly check to see if handrails are loose or broken. Make sure that both sides of any steps have handrails. Any raised decks and porches should have guardrails on the edges. Have any leaves, snow, or ice cleared regularly. Use sand or salt on walking paths during winter. Clean up any spills in your garage right away. This includes oil or grease spills. What can I do in the bathroom? Use night lights. Install grab bars by the toilet and in the tub and shower. Do not use towel bars as grab bars. Use non-skid mats or decals in the tub or shower. If you need to sit down in the shower, use a plastic, non-slip stool. Keep the floor  dry. Clean up any water that spills on the floor as soon as it happens. Remove soap buildup in the tub or shower regularly. Attach bath mats securely with double-sided non-slip rug tape. Do not have throw rugs and other things on the floor that can make you trip. What can I do in the bedroom? Use night lights. Make sure that you have a light by your bed that is easy to reach. Do not use any sheets or blankets that are too big for your bed. They should not hang down onto the floor. Have a firm chair that has side arms. You can use this for support while you get dressed. Do not have throw rugs and other things on the floor that can make you trip. What can I do in the kitchen? Clean up any spills right away. Avoid walking on wet floors. Keep items that you use a lot in easy-to-reach places. If you need to reach something above you, use a strong step stool that has a grab bar. Keep electrical cords out of the way. Do not use floor polish or wax that makes floors slippery. If you must  use wax, use non-skid floor wax. Do not have throw rugs and other things on the floor that can make you trip. What can I do with my stairs? Do not leave any items on the stairs. Make sure that there are handrails on both sides of the stairs and use them. Fix handrails that are broken or loose. Make sure that handrails are as long as the stairways. Check any carpeting to make sure that it is firmly attached to the stairs. Fix any carpet that is loose or worn. Avoid having throw rugs at the top or bottom of the stairs. If you do have throw rugs, attach them to the floor with carpet tape. Make sure that you have a light switch at the top of the stairs and the bottom of the stairs. If you do not have them, ask someone to add them for you. What else can I do to help prevent falls? Wear shoes that: Do not have high heels. Have rubber bottoms. Are comfortable and fit you well. Are closed at the toe. Do not wear  sandals. If you use a stepladder: Make sure that it is fully opened. Do not climb a closed stepladder. Make sure that both sides of the stepladder are locked into place. Ask someone to hold it for you, if possible. Clearly mark and make sure that you can see: Any grab bars or handrails. First and last steps. Where the edge of each step is. Use tools that help you move around (mobility aids) if they are needed. These include: Canes. Walkers. Scooters. Crutches. Turn on the lights when you go into a dark area. Replace any light bulbs as soon as they burn out. Set up your furniture so you have a clear path. Avoid moving your furniture around. If any of your floors are uneven, fix them. If there are any pets around you, be aware of where they are. Review your medicines with your doctor. Some medicines can make you feel dizzy. This can increase your chance of falling. Ask your doctor what other things that you can do to help prevent falls. This information is not intended to replace advice given to you by your health care provider. Make sure you discuss any questions you have with your health care provider. Document Released: 05/06/2009 Document Revised: 12/16/2015 Document Reviewed: 08/14/2014 Elsevier Interactive Patient Education  2017 [employer].

## 2022-06-27 NOTE — Progress Notes (Signed)
Subjective:   Steven [name] is a 76 y.o. male who presents for Medicare Annual/Subsequent preventive examination.  I connected with  Hinton Rao on 06/27/22 by a audio enabled telemedicine application and verified that I am speaking with the correct person using two identifiers.  Patient Location: Home  Provider Location: Office/Clinic  I discussed the limitations of evaluation and management by telemedicine. The patient expressed understanding and agreed to proceed.   Review of Systems    Defer to PCP Cardiac Risk Factors include: advanced age (>74mn, >>9women);dyslipidemia;diabetes mellitus;hypertension;male gender     Objective:    Today's Vitals   06/27/22 0821  PainSc: 5    There is no height or weight on file to calculate BMI.     06/27/2022    8:20 AM 06/20/2021    7:45 AM 11/11/2020    3:22 PM 06/09/2020   11:17 PM 06/09/2020   11:11 PM 06/09/2020    1:18 PM 12/19/2017    3:38 PM  Advanced Directives  Does Patient Have a Medical Advance Directive? Yes Yes Yes  No No No  Type of [place] will [place] will       Does patient want to make changes to medical advance directive? No - Patient declined        Copy of [place] Chart? No - copy requested No - copy requested       Would patient like information on creating a medical advance directive?    No - Patient declined   No - Patient declined    Current Medications (verified) Outpatient Encounter Medications as of 06/27/2022  Medication Sig   amLODipine (NORVASC) 10 MG tablet Take 1 tablet (10 mg total) by mouth daily.   aspirin EC 81 MG tablet Take 81 mg by mouth daily.   blood glucose meter kit and supplies Dispense based on patient and insurance preference. Check blood sugars twice daily. Dx: E11.9   Blood Glucose Monitoring Suppl (ONETOUCH VERIO IQ SYSTEM) w/Device KIT Use to check blood sugar 2 times per day.    Continuous Blood Gluc Sensor (FREESTYLE LIBRE 2 SENSOR) MISC Inject 1 Device into the skin every 14 (fourteen) days.   empagliflozin (JARDIANCE) 25 MG TABS tablet Take 1 tablet (25 mg total) by mouth daily before breakfast.   ezetimibe (ZETIA) 10 MG tablet Take 1 tablet (10 mg total) by mouth daily.   fluticasone (FLONASE) 50 MCG/ACT nasal spray Place 2 sprays into both nostrils daily.   insulin glargine, 1 Unit Dial, (TOUJEO SOLOSTAR) 300 UNIT/ML Solostar Pen Inject 12 Units into the skin daily.   Insulin Pen Needle 32G X 4 MM MISC 1 Device by Does not apply route daily in the afternoon.   ipratropium (ATROVENT) 0.03 % nasal spray Place 2 sprays into both nostrils every 12 (twelve) hours.   latanoprost (XALATAN) 0.005 % ophthalmic solution SMARTSIG:In Eye(s)   levocetirizine (XYZAL) 5 MG tablet Take 1 tablet (5 mg total) by mouth every evening.   metFORMIN (GLUCOPHAGE-XR) 500 MG 24 hr tablet Take 1 tablet (500 mg total) by mouth daily with breakfast.   metoprolol succinate (TOPROL-XL) 100 MG 24 hr tablet TAKE 1 AND 1/2 TABLETS BY MOUTH  DAILY   pravastatin (PRAVACHOL) 80 MG tablet TAKE 1 TABLET BY MOUTH IN THE  EVENING   Semaglutide (RYBELSUS) 14 MG TABS Take 1 tablet (14 mg total) by mouth daily.   sucralfate (CARAFATE) 1 g tablet  Take 1 tablet (1 g total) by mouth 4 (four) times daily -  with meals and at bedtime.   No facility-administered encounter medications on file as of 06/27/2022.    Allergies (verified) Patient has no known allergies.   History: Past Medical History:  Diagnosis Date   Allergy    Arthritis    Right Hip   Atrial fibrillation ([name])    BPH (benign prostatic hyperplasia)    COPD (chronic obstructive pulmonary disease) ([place])    Coronary artery disease    Lexiscan Myoview 7/14:  Inf scar with minimal reversibility, inf HK, EF 45%;  Low risk.  Promus LAD stent placed in [place].  100% RCA   Diabetes 1.5, managed as type 1 ([place])    Diverticulosis    on CT    Dyslipidemia    GERD (gastroesophageal reflux disease)    has used Zantac in the past    Glaucoma    Hammer toes, bilateral    Hernia    Hyperlipidemia    Hypertension    Peripheral vascular disease ([place])    a. s/p R fem-pop BPG 01/2013 (Dr. 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   Past Surgical History:  Procedure Laterality Date   ABDOMINAL AORTAGRAM N/A 12/10/2012   Procedure: ABDOMINAL Maxcine Ham;  Surgeon: Serafina Mitchell, MD;  Location[facility] CATH LAB;  Service: Cardiovascular;  Laterality: N/A;   CARDIAC CATHETERIZATION     with stent placement   FEMORAL-POPLITEAL BYPASS GRAFT Right 02/07/2013   Procedure: BYPASS GRAFT FEMORAL-POPLITEAL ARTERY- RIGHT;  Surgeon: Mal Misty, MD;  Location: [facility] OR;  Service: Vascular;  Laterality: Right;  Right femoral to below knee popliteal bypass using right non reversed great saphenous vein   INGUINAL HERNIA REPAIR  2010   Right   INGUINAL HERNIA REPAIR  09/11/2018   INTRAOPERATIVE ARTERIOGRAM Right 02/07/2013   Procedure: Attempted INTRA OPERATIVE ARTERIOGRAM;  Surgeon: Mal Misty, MD;  Location: [facility] OR;  Service: Vascular;  Laterality: Right;   IR RETROGRADE PYELOGRAM  12/10/2019   TRANSURETHRAL RESECTION OF PROSTATE  12/10/2019   Family History  Problem Relation Age of Onset   Diabetes Mother    Hypertension Mother    Heart disease Mother    Hyperlipidemia Mother    Diabetes Father    Heart disease Father    Hyperlipidemia Father    Hypertension Father    Diabetes Sister    Heart disease Sister    Hyperlipidemia Sister    Hypertension Sister    Diabetes Brother    Heart disease Brother    Hyperlipidemia Brother    Hypertension Brother    Diabetes Son    Heart disease Son    Hyperlipidemia Son    Hypertension Son    Pancreatic cancer Son    Alcohol abuse Neg Hx    Cancer Neg Hx    Depression Neg Hx    Early death Neg Hx    Kidney disease Neg Hx    Stroke Neg Hx    Colon  cancer Neg Hx    Esophageal cancer Neg Hx    Rectal cancer Neg Hx    Stomach cancer Neg Hx    Liver cancer Neg Hx    Social History   Socioeconomic History   Marital status: Married    Spouse name: Not on file   Number of children: 5   Years of education: Not on file  Highest education level: Not on file  Occupational History   Occupation: retired, works part time, Geophysicist/field seismologist   Occupation: auto [employer] [place]  Tobacco Use   Smoking status: Former    Packs/day: 0.30    Years: 50.00    Total pack years: 15.00    Types: Cigarettes    Quit date: 02/04/2011    Years since quitting: 11.4    Passive exposure: Never   Smokeless tobacco: Never  Vaping Use   Vaping Use: Never used  Substance and Sexual Activity   Alcohol use: Not Currently   Drug use: No   Sexual activity: Not on file  Other Topics Concern   Not on file  Social History Narrative   5 children , lost a son    Lives with wife.     Social Determinants of Health   Financial Resource Strain: Low Risk  (06/20/2021)   Overall Financial Resource Strain (CARDIA)    Difficulty of Paying Living Expenses: Not hard at all  Food Insecurity: No Food Insecurity (06/27/2022)   Hunger Vital Sign    Worried About Running Out of Food in the Last Year: Never true    Ran Out of Food in the Last Year: Never true  Transportation Needs: No Transportation Needs (06/27/2022)   PRAPARE - Hydrologist (Medical): No    Lack of Transportation (Non-Medical): No  Physical Activity: Inactive (06/20/2021)   Exercise Vital Sign    Days of Exercise per Week: 0 days    Minutes of Exercise per Session: 0 min  Stress: No Stress Concern Present (06/20/2021)   [place]    Feeling of Stress : Not at all  Social Connections: [place] (06/20/2021)   Social Connection and Isolation Panel [NHANES]    Frequency of Communication with Friends and  Family: More than three times a week    Frequency of Social Gatherings with Friends and Family: Once a week    Attends Religious Services: More than 4 times per year    Active Member of [employer] or Organizations: Yes    Attends Music therapist: More than 4 times per year    Marital Status: Married    Tobacco Counseling Counseling given: Not Answered   Clinical Intake:  Pre-visit preparation completed: Yes  Pain : 0-10 Pain Score: 5  Pain Location: Leg Pain Orientation: Right Pain Descriptors / Indicators: Aching Pain Onset: More than a month ago Pain Frequency: Intermittent   How often do you need to have someone help you when you read instructions, pamphlets, or other written materials from your doctor or pharmacy?: 1 - Never  Diabetic? Nutrition Risk Assessment:  Has the patient had any N/V/D within the last 2 months?  No  Does the patient have any non-healing wounds?  No  Has the patient had any unintentional weight loss or weight gain?  No   Diabetes:  Is the patient diabetic?  Yes  If diabetic, was a CBG obtained today?  No  Did the patient bring in their glucometer from home?  No  How often do you monitor your CBG's? Wears CGM.   Financial Strains and Diabetes Management:  Are you having any financial strains with the device, your supplies or your medication? No .  Does the patient want to be seen by Chronic Care Management for management of their diabetes?  No  Would the patient like to be referred to a Nutritionist  or for Diabetic Management?  No   Diabetic Exams:  Diabetic Eye Exam: Completed 07/21/21 Diabetic Foot Exam: Completed 01/17/22   Interpreter Needed?: No  Information entered by :: Beatris Ship, [place] of Daily Living    06/27/2022    8:25 AM  In your present state of health, do you have any difficulty performing the following activities:  Hearing? 0  Vision? 0  Difficulty concentrating or making decisions? 0   Walking or climbing stairs? 0  Dressing or bathing? 0  Doing errands, shopping? 0  Preparing Food and eating ? N  Using the Toilet? N  In the past six months, have you accidently leaked urine? Y  Do you have problems with loss of bowel control? N  Managing your Medications? N  Managing your Finances? N  Housekeeping or managing your Housekeeping? N    Patient Care Team: Colon Branch, MD as PCP - General (Internal Medicine) Minus Breeding, MD as PCP - Cardiology (Cardiology) Minus Breeding, MD as Attending Physician (Cardiology) Puschinsky, Fransico Him., MD as Consulting Physician (Urology) Antionette Fairy, Isaias Cowman, MD as Referring Physician (Ophthalmology)  Indicate any recent Medical Services you may have received from other than [facility] providers in the past year (date may be approximate).     Assessment:   This is a routine wellness examination for Nickolas.  Hearing/Vision screen No results found.  Dietary issues and exercise activities discussed: Current Exercise Habits: The patient does not participate in regular exercise at present, Exercise limited by: None identified   Goals Addressed   None    Depression Screen    06/27/2022    8:24 AM 04/26/2022    8:09 AM 06/20/2021    7:51 AM 11/11/2020    3:22 PM 01/09/2020   10:36 AM 11/26/2017    4:17 PM 02/08/2016    5:20 PM  PHQ 2/9 Scores  PHQ - 2 Score 0 0 0 0 3 0 0  PHQ- 9 Score     8      Fall Risk    06/27/2022    8:22 AM 04/26/2022    8:09 AM 06/20/2021    7:49 AM 11/11/2020    3:22 PM 01/09/2020   10:00 AM  Fall Risk   Falls in the past year? 0 0 0 0 0  Number falls in past yr: 0 0 0  0  Injury with Fall? 0 0 0  0  Risk for fall due to : No Fall Risks      Follow up Falls evaluation completed Falls evaluation completed Falls prevention discussed  Falls evaluation completed    [place]:  Any stairs in or around the home? No  If so, are there any without handrails? No  Home  free of loose throw rugs in walkways, pet beds, electrical cords, etc? Yes  Adequate lighting in your home to reduce risk of falls? Yes   ASSISTIVE DEVICES UTILIZED TO PREVENT FALLS:  Life alert? No  Use of a cane, walker or w/c? No  Grab bars in the bathroom? Yes  Shower chair or bench in shower? No  Elevated toilet seat or a handicapped toilet?  Comfort height  TIMED UP AND GO:  Was the test performed?  No, audio visit .    Cognitive Function:        06/27/2022    8:31 AM  6CIT Screen  What Year? 0 points  What month? 0 points  What time?  0 points  Count back from 20 0 points  Months in reverse 2 points  Repeat phrase 4 points  Total Score 6 points    Immunizations Immunization History  Administered Date(s) Administered   Fluad Quad(high Dose 65+) 06/07/2020, 05/02/2021, 04/26/2022   Influenza Whole 04/23/2012   Influenza, High Dose Seasonal PF 05/22/2013, 06/14/2016, 05/24/2017, 04/15/2018   Influenza,inj,Quad PF,6+ Mos 07/13/2015   Influenza-Unspecified 04/14/2019   PFIZER Comirnaty(Gray Top)Covid-19 Tri-Sucrose Vaccine 03/02/2021   PFIZER(Purple Top)SARS-COV-2 Vaccination 09/19/2019, 10/15/2019, 06/22/2020   PNEUMOCOCCAL CONJUGATE-20 10/31/2021   Pfizer Covid-19 Vaccine Bivalent Booster 42yr & up 05/13/2021   Pneumococcal Conjugate-13 12/29/2014   Pneumococcal Polysaccharide-23 10/11/2012   Tdap 05/22/2013   Zoster, Live 12/29/2014    TDAP status: Up to date  Flu Vaccine status: Up to date  Pneumococcal vaccine status: Up to date  Covid-19 vaccine status: Information provided on how to obtain vaccines.   Qualifies for Shingles Vaccine? Yes   Zostavax completed Yes   Shingrix Completed?: No.    Education has been provided regarding the importance of this vaccine. Patient has been advised to call insurance company to determine out of pocket expense if they have not yet received this vaccine. Advised may also receive vaccine at local pharmacy or Health  Dept. Verbalized acceptance and understanding.  Screening Tests Health Maintenance  Topic Date Due   Zoster Vaccines- Shingrix (1 of 2) Never done   COVID-19 Vaccine (6 - 2023-24 season) 03/24/2022   Medicare Annual Wellness (AWV)  06/20/2022   HEMOGLOBIN A1C  07/19/2022   OPHTHALMOLOGY EXAM  07/21/2022   COLONOSCOPY (Pts 45-432yrInsurance coverage will need to be confirmed)  12/20/2022   FOOT EXAM  01/18/2023   Diabetic kidney evaluation - GFR measurement  04/27/2023   Diabetic kidney evaluation - Urine ACR  04/27/2023   DTaP/Tdap/Td (2 - Td or Tdap) 05/23/2023   Pneumonia Vaccine 6590Years old  Completed   INFLUENZA VACCINE  Completed   Hepatitis C Screening  Completed   HPV VACCINES  Aged Out    Health Maintenance  Health Maintenance Due  Topic Date Due   Zoster Vaccines- Shingrix (1 of 2) Never done   COVID-19 Vaccine (6 - 2023-24 season) 03/24/2022   Medicare Annual Wellness (AWV)  06/20/2022    Colorectal cancer screening: Type of screening: Colonoscopy. Completed 12/19/17. Repeat every 5 years  Lung Cancer Screening: (Low Dose CT Chest recommended if Age 74-80ears, 30 pack-year currently smoking OR have quit w/in 15years.) does not qualify.   Additional Screening:  Hepatitis C Screening: does qualify; Completed 06/10/20  Vision Screening: Recommended annual ophthalmology exams for early detection of glaucoma and other disorders of the eye. Is the patient up to date with their annual eye exam?  Yes  Who is the provider or what is the name of the office in which the patient attends annual eye exams? [facility] with a provider, would they like to be referred to a provider to establish care? No .   Dental Screening: Recommended annual dental exams for proper oral hygiene  Community Resource Referral / Chronic Care Management: CRR required this visit?  No   CCM required this visit?  No      Plan:     I have personally reviewed and  noted the following in the patient's chart:   Medical and social history Use of alcohol, tobacco or illicit drugs  Current medications and supplements including opioid prescriptions. Patient is not currently taking  opioid prescriptions. Functional ability and status Nutritional status Physical activity Advanced directives List of other physicians Hospitalizations, surgeries, and ER visits in previous 12 months Vitals Screenings to include cognitive, depression, and falls Referrals and appointments  In addition, I have reviewed and discussed with patient certain preventive protocols, quality metrics, and best practice recommendations. A written personalized care plan for preventive services as well as general preventive health recommendations were provided to patient.   Due to this being a telephonic visit, the after visit summary with patients personalized plan was offered to patient via mail or my-chart.  Patient would like to access on my-chart.  Beatris Ship, [place]   06/27/2022   Nurse Notes: None

## 2022-07-06 ENCOUNTER — Other Ambulatory Visit: Payer: Self-pay | Admitting: Internal Medicine

## 2022-07-19 ENCOUNTER — Ambulatory Visit: Payer: Medicare Other | Admitting: Internal Medicine

## 2022-07-20 NOTE — Progress Notes (Deleted)
Name: Steven [name]  Age/ Sex: 76 y.o., male   MRN/ DOB: [id number], 12/24/1945     PCP: Colon Branch, MD   Reason for Endocrinology Evaluation: Type 2 Diabetes Mellitus  Initial Endocrine Consultative Visit: 09/07/2020    PATIENT IDENTIFIER: Steven [name] is a 76 y.o. male with a past medical history of T2DM, A.Fibrillation, COPD CAD and Dyslipidemia. The patient has followed with Endocrinology clinic since 09/07/2020 for consultative assistance with management of his diabetes.  DIABETIC HISTORY:  Steven [name] was diagnosed with DM in 1977, Has been on Metformin- no intolerance but had to be stopped while in hospital. Has been on insulin since 2014. His hemoglobin A1c has ranged from  in 7.0% in 2021, peaking at 8.4% in 2016.   He was seen by Steven [name] in 2018 but was lost to follow-up until his return to our clinic in 2022.  On his initial visit with me he had an A1c of 8.1% he was on Jardiance and Toujeo, his GFR was 46 at the time and we restarted 50% dose of metformin we also increase his Jardiance and continued insulin.   Rybelsus started 04/2021  SUBJECTIVE:   During the last visit (01/17/2022): A1c 7.6 %   Today (07/20/2022): Steven [name]  is here for diabetes management  He checks his blood sugars multiple  times daily, through CGM . The patient has  had hypoglycemic episodes since the last clinic visit.  He is not symptomatic with these episodes.  Denies nausea, vomiting or diarrhea     HOME DIABETES REGIMEN:  Metformin 500 mg XR, daily  Jardiance 25 mg, 1 tablet with Breakfast Toujeo 12 units daily  Rybelsus 7 mg daily       Statin: yes ACE-I/ARB: no     CONTINUOUS GLUCOSE MONITORING RECORD INTERPRETATION    Dates of Recording: 1/11-1/24/2023  Sensor description: freestyle libre  Results statistics:   CGM use % of time 34  Average and SD 142/32.9  Time in range    82%  % Time Above 180 15  % Time above 250 3  % Time Below target 0     Glycemic patterns summary: Hyperglycemia noted during the day postprandial with hypoglycemia at night   Hyperglycemic episodes  postprandial   Hypoglycemic episodes occurred at night - fasting   Overnight periods: trends down      DIABETIC COMPLICATIONS: Microvascular complications:  Neuropathy, CKD III Denies: retinopathy  Last eye exam: Completed 02/23/2022   Macrovascular complications:  CAD Denies:  PVD, CVA   HISTORY:  Past Medical History:  Past Medical History:  Diagnosis Date   Allergy    Arthritis    Right Hip   Atrial fibrillation ([name])    BPH (benign prostatic hyperplasia)    COPD (chronic obstructive pulmonary disease) ([place])    Coronary artery disease    Lexiscan Myoview 7/14:  Inf scar with minimal reversibility, inf HK, EF 45%;  Low risk.  Promus LAD stent placed in [place].  100% RCA   Diabetes 1.5, managed as type 1 ([place])    Diverticulosis    on CT   Dyslipidemia    GERD (gastroesophageal reflux disease)    has used Zantac in the past    Glaucoma    Hammer toes, bilateral    Hernia    Hyperlipidemia    Hypertension    Peripheral vascular disease ([place])    a. s/p R fem-pop BPG 01/2013 (Dr. 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   Past Surgical History:  Past Surgical History:  Procedure Laterality Date   ABDOMINAL AORTAGRAM N/A 12/10/2012   Procedure: ABDOMINAL AORTAGRAM;  Surgeon: Serafina Mitchell, MD;  Location: [facility] CATH LAB;  Service: Cardiovascular;  Laterality: N/A;   CARDIAC CATHETERIZATION     with stent placement   FEMORAL-POPLITEAL BYPASS GRAFT Right 02/07/2013   Procedure: BYPASS GRAFT FEMORAL-POPLITEAL ARTERY- RIGHT;  Surgeon: Mal Misty, MD;  Location: [facility];  Service: Vascular;  Laterality: Right;  Right femoral to below knee popliteal bypass using right non reversed great saphenous vein   INGUINAL HERNIA REPAIR  2010   Right   INGUINAL HERNIA REPAIR  09/11/2018    INTRAOPERATIVE ARTERIOGRAM Right 02/07/2013   Procedure: Attempted INTRA OPERATIVE ARTERIOGRAM;  Surgeon: Mal Misty, MD;  Location: [place];  Service: Vascular;  Laterality: Right;   IR RETROGRADE PYELOGRAM  12/10/2019   TRANSURETHRAL RESECTION OF PROSTATE  12/10/2019   Social History:  reports that he quit smoking about 11 years ago. His smoking use included cigarettes. He has a 15.00 pack-year smoking history. He has never been exposed to tobacco smoke. He has never used smokeless tobacco. He reports that he does not currently use alcohol. He reports that he does not use drugs. Family History:  Family History  Problem Relation Age of Onset   Diabetes Mother    Hypertension Mother    Heart disease Mother    Hyperlipidemia Mother    Diabetes Father    Heart disease Father    Hyperlipidemia Father    Hypertension Father    Diabetes Sister    Heart disease Sister    Hyperlipidemia Sister    Hypertension Sister    Diabetes Brother    Heart disease Brother    Hyperlipidemia Brother    Hypertension Brother    Diabetes Son    Heart disease Son    Hyperlipidemia Son    Hypertension Son    Pancreatic cancer Son    Alcohol abuse Neg Hx    Cancer Neg Hx    Depression Neg Hx    Early death Neg Hx    Kidney disease Neg Hx    Stroke Neg Hx    Colon cancer Neg Hx    Esophageal cancer Neg Hx    Rectal cancer Neg Hx    Stomach cancer Neg Hx    Liver cancer Neg Hx      HOME MEDICATIONS: Allergies as of 07/21/2022   No Known Allergies      Medication List        Accurate as of July 20, 2022  4:10 PM. If you have any questions, ask your nurse or doctor.          amLODipine 10 MG tablet Commonly known as: NORVASC Take 1 tablet (10 mg total) by mouth daily.   aspirin EC 81 MG tablet Take 81 mg by mouth daily.   blood glucose meter kit and supplies Dispense based on patient and insurance preference. Check blood sugars twice daily. Dx: E11.9   empagliflozin 25 MG  Tabs tablet Commonly known as: Jardiance Take 1 tablet (25 mg total) by mouth daily before breakfast.   ezetimibe 10 MG tablet Commonly known as: Zetia Take 1 tablet (10 mg total) by mouth daily.   fluticasone 50 MCG/ACT nasal spray Commonly known as: FLONASE Place 2 sprays into both nostrils daily.   FreeStyle [place] 2 Sensor  Misc Inject 1 Device into the skin every 14 (fourteen) days.   Insulin Pen Needle 32G X 4 MM Misc 1 Device by Does not apply route daily in the afternoon.   ipratropium 0.03 % nasal spray Commonly known as: ATROVENT Place 2 sprays into both nostrils every 12 (twelve) hours.   latanoprost 0.005 % ophthalmic solution Commonly known as: XALATAN SMARTSIG:In Eye(s)   levocetirizine 5 MG tablet Commonly known as: XYZAL Take 1 tablet (5 mg total) by mouth every evening.   metFORMIN 500 MG 24 hr tablet Commonly known as: GLUCOPHAGE-XR Take 1 tablet (500 mg total) by mouth daily with breakfast.   metoprolol succinate 100 MG 24 hr tablet Commonly known as: TOPROL-XL TAKE 1 AND 1/2 TABLETS BY MOUTH  DAILY   OneTouch Verio IQ System w/Device Kit Use to check blood sugar 2 times per day.   pravastatin 80 MG tablet Commonly known as: PRAVACHOL TAKE 1 TABLET BY MOUTH IN THE  EVENING   Rybelsus 14 MG Tabs Generic drug: Semaglutide Take 1 tablet (14 mg total) by mouth daily.   sucralfate 1 g tablet Commonly known as: CARAFATE Take 1 tablet (1 g total) by mouth 4 (four) times daily -  with meals and at bedtime.   Toujeo SoloStar 300 UNIT/ML Solostar Pen Generic drug: insulin glargine (1 Unit Dial) Inject 12 Units into the skin daily.         OBJECTIVE:   Vital Signs: There were no vitals taken for this visit.  Wt Readings from Last 3 Encounters:  06/20/22 133 lb 8 oz (60.6 kg)  04/26/22 131 lb 6 oz (59.6 kg)  01/17/22 131 lb (59.4 kg)     Exam: General: Pt appears well and is in NAD  Lungs: Clear with good BS bilat with no rales, rhonchi,  or wheezes  Heart: RRR  Extremities: No pretibial edema.   Neuro: MS is good with appropriate affect, pt is alert and Ox3   DM foot exam: 01/17/2022   The skin of the feet is intact without sores or ulcerations. The pedal pulses are absent The sensation is intact to a screening 5.07, 10 gram monofilament bilaterally       DATA REVIEWED:  Lab Results  Component Value Date   HGBA1C 7.6 (A) 01/17/2022   HGBA1C 8.4 (A) 08/16/2021   HGBA1C 7.6 (A) 05/16/2021   Lab Results  Component Value Date   MICROALBUR 12.2 (H) 04/26/2022   LDLCALC 87 04/26/2022   CREATININE 1.71 (H) 04/26/2022        Latest Reference Range & Units 04/26/22 08:32  Sodium 135 - 145 mEq/L 139  Potassium 3.5 - 5.1 mEq/L 4.1  Chloride 96 - 112 mEq/L 103  CO2 19 - 32 mEq/L 27  Glucose 70 - 99 mg/dL 235 (H)  BUN 6 - 23 mg/dL 20  Creatinine 0.40 - 1.50 mg/dL 1.71 (H)  Calcium 8.4 - 10.5 mg/dL 9.6  GFR >60.00 mL/min 39.07 (L)    Latest Reference Range & Units 04/26/22 08:32  Total CHOL/HDL Ratio  3  Cholesterol 0 - 200 mg/dL 171  HDL Cholesterol >39.00 mg/dL 61.50  LDL (calc) 0 - 99 mg/dL 87  MICROALB/CREAT RATIO 0.0 - 30.0 mg/g 48.3 (H)  NonHDL  109.28  Triglycerides 0.0 - 149.0 mg/dL 109.0  VLDL 0.0 - 40.0 mg/dL 21.8  (H): Data is abnormally high     ASSESSMENT / PLAN / RECOMMENDATIONS:   1) Type 2 Diabetes Mellitus, With improving  glycemic control, With Neuropathic,  CKD III and macrovascular complications - Most recent A1c of 7.6  %. Goal A1c < 7.0 %.      -A1c has trended down from 8.4 % to 7.6 %  - Unable to increase metformin due to low GFR - Will increase Insulin and Rybelsus as below  - He continues with postprandial hyperglycemia  - Unable to download CGM as the last data was 6/20th and needed a new sensor  - A refill for freestyle libre sensors sent    MEDICATIONS: Contact Metformin 500 mg XR, with Breakfast  Increase Rybelsus 7 mg daily  Continue  Jardiance 25 mg, 1 tablet with  Breakfast  Increase Toujeo 12 units daily   EDUCATION / INSTRUCTIONS: BG monitoring instructions: Patient is instructed to check his blood sugars 3 times a day, before meals. Call [place] if: BG persistently < 70  I reviewed the Rule of 15 for the treatment of hypoglycemia in detail with the patient. Literature supplied.   2) Diabetic complications:  Eye: Does not have known diabetic retinopathy.  Neuro/ Feet: Does have known diabetic peripheral neuropathy .  Renal: Patient does have known baseline CKD. He is under the care of nephrology    F/U in 4 months   Signed electronically by: Mack Guise, MD  [facility]  [facility] [address] [address], [place] [address] Phone: [phone] FAX: [phone]   CC: Colon Branch, [address] [place] [address] Phone: [phone]  Fax: [phone]  Return to Endocrinology clinic as below: Future Appointments  Date Time Provider [place]  07/21/2022  7:50 AM Ofilia Rayon, Melanie Crazier, MD [facility] None  07/27/2022  8:20 AM Colon Branch, MD [facility]  09/01/2022  8:00 AM Colon Branch, MD [facility]

## 2022-07-21 ENCOUNTER — Ambulatory Visit: Payer: Medicare Other | Admitting: Internal Medicine

## 2022-07-27 ENCOUNTER — Ambulatory Visit (INDEPENDENT_AMBULATORY_CARE_PROVIDER_SITE_OTHER): Payer: Medicare Other | Admitting: Internal Medicine

## 2022-07-27 ENCOUNTER — Encounter: Payer: Self-pay | Admitting: Internal Medicine

## 2022-07-27 VITALS — BP 134/80 | HR 62 | Temp 97.8°F | Resp 16 | Ht 65.0 in | Wt 134.1 lb

## 2022-07-27 DIAGNOSIS — Z794 Long term (current) use of insulin: Secondary | ICD-10-CM

## 2022-07-27 DIAGNOSIS — L989 Disorder of the skin and subcutaneous tissue, unspecified: Secondary | ICD-10-CM | POA: Diagnosis not present

## 2022-07-27 DIAGNOSIS — E1122 Type 2 diabetes mellitus with diabetic chronic kidney disease: Secondary | ICD-10-CM

## 2022-07-27 DIAGNOSIS — N1831 Chronic kidney disease, stage 3a: Secondary | ICD-10-CM | POA: Diagnosis not present

## 2022-07-27 DIAGNOSIS — E785 Hyperlipidemia, unspecified: Secondary | ICD-10-CM | POA: Diagnosis not present

## 2022-07-27 DIAGNOSIS — N182 Chronic kidney disease, stage 2 (mild): Secondary | ICD-10-CM | POA: Diagnosis not present

## 2022-07-27 LAB — LIPID PANEL
Cholesterol: 143 mg/dL (ref 0–200)
HDL: 59 mg/dL (ref >39.00)
LDL Cholesterol: 68 mg/dL (ref 0–99)
NonHDL: 84.2
Total CHOL/HDL Ratio: 2
Triglycerides: 80 mg/dL (ref 0.0–149.0)
VLDL: 16 mg/dL (ref 0.0–40.0)

## 2022-07-27 LAB — BASIC METABOLIC PANEL
BUN: 23 mg/dL (ref 6–23)
CO2: 26 mEq/L (ref 19–32)
Calcium: 9.5 mg/dL (ref 8.4–10.5)
Chloride: 107 mEq/L (ref 96–112)
Creatinine, Ser: 1.51 mg/dL — ABNORMAL HIGH (ref 0.40–1.50)
GFR: 45.28 mL/min — ABNORMAL LOW (ref >60.00)
Glucose, Bld: 132 mg/dL — ABNORMAL HIGH (ref 70–99)
Potassium: 4 mEq/L (ref 3.5–5.1)
Sodium: 142 mEq/L (ref 135–145)

## 2022-07-27 LAB — HEMOGLOBIN A1C: Hgb A1c MFr Bld: 7.6 % — ABNORMAL HIGH (ref 4.6–6.5)

## 2022-07-27 NOTE — Progress Notes (Signed)
Subjective:    Patient ID: Steven [name], male    DOB: 08-14-1945, 77 y.o.   MRN: [id number]  DOS:  07/27/2022 Type of visit - description: Follow-up  Here for evaluation of chronic medical issues. Has not been able to see endocrinology, he missed the appointment. Has ongoing nasal discharge, it is clear, persisting, worse in the daytime, increased when eating.  The discharge coming from both nostrils. No fever or chills No sneezing or itchy eyes.  Also denies chest pain, cough or difficulty breathing.  Review of Systems See above   Past Medical History:  Diagnosis Date   Allergy    Arthritis    Right Hip   Atrial fibrillation ([name])    BPH (benign prostatic hyperplasia)    COPD (chronic obstructive pulmonary disease) ([place])    Coronary artery disease    Lexiscan Myoview 7/14:  Inf scar with minimal reversibility, inf HK, EF 45%;  Low risk.  Promus LAD stent placed in [place].  100% RCA   Diabetes 1.5, managed as type 1 ([place])    Diverticulosis    on CT   Dyslipidemia    GERD (gastroesophageal reflux disease)    has used Zantac in the past    Glaucoma    Hammer toes, bilateral    Hernia    Hyperlipidemia    Hypertension    Peripheral vascular disease ([place])    a. s/p R fem-pop BPG 01/2013 (Dr. 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   Sickle cell anemia ([place])    has trait    Past Surgical History:  Procedure Laterality Date   ABDOMINAL AORTAGRAM N/A 12/10/2012   Procedure: ABDOMINAL Maxcine Ham;  Surgeon: Serafina Mitchell, MD;  Location[facility] C[facility] LAB;  Service: Cardiovascular;  Laterality: N/A;   CARDIAC CATHETERIZATION     with stent placement   FEMORAL-POPLITEAL BYPASS GRAFT Right 02/07/2013   Procedure: BYPASS GRAFT FEMORAL-POPLITEAL ARTERY- RIGHT;  Surgeon: Mal Misty, MD;  Location[facility] OR;  Service: Vascular;  Laterality: Right;  Right femoral to below knee popliteal bypass using right non reversed great saphenous vein   INGUINAL HERNIA REPAIR  2010    Right   INGUINAL HERNIA REPAIR  09/11/2018   INTRAOPERATIVE ARTERIOGRAM Right 02/07/2013   Procedure: Attempted INTRA OPERATIVE ARTERIOGRAM;  Surgeon: Mal Misty, MD;  Location: [place];  Service: Vascular;  Laterality: Right;   IR RETROGRADE PYELOGRAM  12/10/2019   TRANSURETHRAL RESECTION OF PROSTATE  12/10/2019    Current Outpatient Medications  Medication Instructions   amLODipine (NORVASC) 10 mg, Oral, Daily   aspirin EC 81 mg, Daily   blood glucose meter kit and supplies Dispense based on patient and insurance preference. Check blood sugars twice daily. Dx: E11.9   Blood Glucose Monitoring Suppl (ONETOUCH VERIO IQ SYSTEM) w/Device KIT Use to check blood sugar 2 times per day.   Continuous Blood Gluc Sensor (FREESTYLE LIBRE 2 SENSOR) MISC 1 Device, Subcutaneous, Every 14 days   empagliflozin (JARDIANCE) 25 mg, Oral, Daily before breakfast   ezetimibe (ZETIA) 10 mg, Oral, Daily   fluticasone (FLONASE) 50 MCG/ACT nasal spray 2 sprays, Each Nare, Daily   Insulin Pen Needle 32G X 4 MM MISC 1 Device, Does not apply, Daily   ipratropium (ATROVENT) 0.03 % nasal spray 2 sprays, Each Nare, Every 12 hours   latanoprost (XALATAN) 0.005 % ophthalmic solution SMARTSIG:In Eye(s)   levocetirizine (XYZAL) 5 mg, Oral, Every evening   metFORMIN (GLUCOPHAGE-XR) 500 mg, Oral, Daily with breakfast  metoprolol succinate (TOPROL-XL) 100 MG 24 hr tablet TAKE 1 AND 1/2 TABLETS BY MOUTH  DAILY   pravastatin (PRAVACHOL) 80 MG tablet TAKE 1 TABLET BY MOUTH IN THE  EVENING   Rybelsus 14 mg, Oral, Daily   sucralfate (CARAFATE) 1 g, Oral, 3 times daily with meals & bedtime       Objective:   Physical Exam BP 134/80   Pulse 62   Temp 97.8 F (36.6 C) (Oral)   Resp 16   Ht _0  (1.651 m)   Wt 134 lb 2 oz (60.8 kg)   SpO2 98%   BMI 22.32 kg/m  General:   Well developed, NAD, BMI noted. HEENT:  Normocephalic . Face symmetric, atraumatic Turbinates: Seems slightly erythematous, minimal  swelling. Lungs:  CTA B Normal respiratory effort, no intercostal retractions, no accessory muscle use. Heart: RRR,  no murmur.  Lower extremities: no pretibial edema bilaterally  Skin: At the bridge of the nose, he has a 4 mm, doughnut shaped skin lesion. Neurologic:  alert & oriented X3.  Speech normal, gait appropriate for age and unassisted Psych--  Cognition and judgment appear intact.  Cooperative with normal attention span and concentration.  Behavior appropriate. No anxious or depressed appearing.      Assessment     ASSESSMENT (new pt to me 12/2019) DM, first visit with Endo 09/07/2020 HTN Hyperlipidemia CKD: --Creatinine fluctuates 1.70- 1.40 --CT November 2021: Normal kidneys and bladder CV: --CAD, Dr. Percival Spanish --PAD, fem-pop  2014 Sickle cell anemia COPD BPH, TURP and bilateral retrograde pyelograms 12/10/2019, Dr Hazle Nordmann   PLAN DM:  won't be able to see endo soon, currently on: Jardiance, metformin, Rybelsus; PT STOPPED INSULIN ~ 03-2022 d/t low sugars. Has a  CGM: 97% in range. Check A1c. HTN: BP looks very good, continue amlodipine, metoprolol. CKD: Check BMP High cholesterol: Based on last FLP, Zetia was added to Pravachol.  Checking labs. Skin lesion: See physical exam, referred to dermatology. 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  Rec to add Astelin, if not better consider ENT referral. Preventive care: Vaccine advice provided, see AVS RTC 3 months

## 2022-07-27 NOTE — Patient Instructions (Addendum)
Vaccines I recommend:  Covid booster Shingrix (shingles) RSV vaccine  We are referring you to dermatology  For runny nose: Nasal ipratropium, Flonase, restart Astepro twice daily Continue Xyzal  Check the  blood pressure regularly BP GOAL is between 110/65 and  135/85. If it is consistently higher or lower, let me know      GO TO THE LAB : Get the blood work     [place], [place] back for checkup in 3 months

## 2022-07-27 NOTE — Assessment & Plan Note (Signed)
DM:  won't be able to see endo soon, currently on: Jardiance, metformin, Rybelsus; PT STOPPED INSULIN ~ 03-2022 d/t low sugars. Has a  CGM: 97% in range. Check A1c. HTN: BP looks very good, continue amlodipine, metoprolol. CKD: Check BMP High cholesterol: Based on last FLP, Zetia was added to Pravachol.  Checking labs. Skin lesion: See physical exam, referred to dermatology. 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  Rec to add Astelin, if not better consider ENT referral. Preventive care: Vaccine advice provided, see AVS RTC 3 months

## 2022-08-02 ENCOUNTER — Other Ambulatory Visit (HOSPITAL_BASED_OUTPATIENT_CLINIC_OR_DEPARTMENT_OTHER): Payer: Self-pay

## 2022-08-02 MED ORDER — AREXVY 120 MCG/0.5ML IM SUSR
INTRAMUSCULAR | 0 refills | Status: DC
  Filled 2022-08-02: qty 0.5, 1d supply, fill #0

## 2022-08-02 MED ORDER — COMIRNATY 30 MCG/0.3ML IM SUSY
PREFILLED_SYRINGE | INTRAMUSCULAR | 0 refills | Status: DC
  Filled 2022-08-02: qty 0.3, 1d supply, fill #0

## 2022-08-19 ENCOUNTER — Other Ambulatory Visit: Payer: Self-pay

## 2022-08-19 ENCOUNTER — Emergency Department (HOSPITAL_BASED_OUTPATIENT_CLINIC_OR_DEPARTMENT_OTHER)
Admission: EM | Admit: 2022-08-19 | Discharge: 2022-08-20 | Disposition: A | Discharge status: Home / Self Care | Payer: Medicare Other | Attending: Emergency Medicine | Admitting: Emergency Medicine

## 2022-08-19 ENCOUNTER — Emergency Department (HOSPITAL_BASED_OUTPATIENT_CLINIC_OR_DEPARTMENT_OTHER): Payer: Medicare Other

## 2022-08-19 DIAGNOSIS — Z79899 Other long term (current) drug therapy: Secondary | ICD-10-CM | POA: Diagnosis not present

## 2022-08-19 DIAGNOSIS — Z7982 Long term (current) use of aspirin: Secondary | ICD-10-CM | POA: Diagnosis not present

## 2022-08-19 DIAGNOSIS — M79651 Pain in right thigh: Secondary | ICD-10-CM | POA: Insufficient documentation

## 2022-08-19 DIAGNOSIS — M79604 Pain in right leg: Secondary | ICD-10-CM | POA: Diagnosis present

## 2022-08-19 DIAGNOSIS — M25551 Pain in right hip: Secondary | ICD-10-CM | POA: Insufficient documentation

## 2022-08-19 LAB — BASIC METABOLIC PANEL
Anion gap: 9 (ref 5–15)
BUN: 29 mg/dL — ABNORMAL HIGH (ref 8–23)
CO2: 23 mmol/L (ref 22–32)
Calcium: 9.6 mg/dL (ref 8.9–10.3)
Chloride: 105 mmol/L (ref 98–111)
Creatinine, Ser: 1.61 mg/dL — ABNORMAL HIGH (ref 0.61–1.24)
GFR, Estimated: 45 mL/min — ABNORMAL LOW (ref >60)
Glucose, Bld: 113 mg/dL — ABNORMAL HIGH (ref 70–99)
Potassium: 4 mmol/L (ref 3.5–5.1)
Sodium: 137 mmol/L (ref 135–145)

## 2022-08-19 MED ORDER — SODIUM CHLORIDE 0.9 % IV BOLUS
500.0000 mL | Freq: Once | INTRAVENOUS | Status: AC
  Administered 2022-08-20: 500 mL via INTRAVENOUS

## 2022-08-19 NOTE — ED Triage Notes (Signed)
Patient presents to ED via POV from home. Here with right leg pain x 2 day. Reports pain starts in his hip and radiates down his leg. Denies recent injury or trauma. Denies urinary incontinence or saddle paraesthesia. Ambulatory.

## 2022-08-19 NOTE — ED Notes (Signed)
Pt c/o right hip pain that extends down his let to his foot.  Pt states he had a sudden onset on Thursday w/ no apparent injury.  Pt does have good pulses in foot but is unable to stand and apply weight to foot.

## 2022-08-19 NOTE — ED Provider Notes (Signed)
[facility] Provider Note   CSN: [id number] Arrival date & time: 08/19/22  1404     History {Add pertinent medical, surgical, social history, OB history to HPI:1} Chief Complaint  Patient presents with   Leg Pain    Steven [name] is a 77 y.o. male.  The history is provided by the patient.  Leg Pain Location:  Buttock and hip Time since incident:  2 days Injury: no   Hip location:  R hip Buttock location:  R buttock Pain details:    Quality:  Aching   Radiates to:  R leg   Severity:  Moderate   Onset quality:  Gradual   Duration:  2 days   Timing:  Constant   Progression:  Unchanged Relieved by:  Nothing Worsened by:  Nothing Ineffective treatments:  None tried Associated symptoms: no back pain and no fever   Risk factors: no concern for non-accidental trauma        Home Medications Prior to Admission medications   Medication Sig Start Date End Date Taking? Authorizing Provider  amLODipine (NORVASC) 10 MG tablet Take 1 tablet (10 mg total) by mouth daily. 04/10/22   Colon Branch, MD  aspirin EC 81 MG tablet Take 81 mg by mouth daily.    [provider]  blood glucose meter kit and supplies Dispense based on patient and insurance preference. Check blood sugars twice daily. Dx: E11.9 02/10/20   Colon Branch, MD  Blood Glucose Monitoring Suppl (ONETOUCH VERIO IQ SYSTEM) w/Device KIT Use to check blood sugar 2 times per day. 10/09/16   Renato Shin, MD  Continuous Blood Gluc Sensor (FREESTYLE LIBRE 2 SENSOR) MISC Inject 1 Device into the skin every 14 (fourteen) days. 01/17/22   Shamleffer, Melanie Crazier, MD  COVID-19 mRNA vaccine [id number] (COMIRNATY) syringe Inject into the muscle. 08/02/22   Carlyle Basques, MD  empagliflozin (JARDIANCE) 25 MG TABS tablet Take 1 tablet (25 mg total) by mouth daily before breakfast. 08/16/21   Shamleffer, Melanie Crazier, MD  ezetimibe (ZETIA) 10 MG tablet Take 1 tablet (10 mg total)  by mouth daily. 04/28/22   Colon Branch, MD  fluticasone [facility]) 50 MCG/ACT nasal spray Place 2 sprays into both nostrils daily. 06/20/22   Shelda Pal, DO  Insulin Pen Needle 32G X 4 MM MISC 1 Device by Does not apply route daily in the afternoon. 08/16/21   Shamleffer, Melanie Crazier, MD  ipratropium (ATROVENT) 0.03 % nasal spray Place 2 sprays into both nostrils every 12 (twelve) hours. 04/26/22   Colon Branch, MD  latanoprost (XALATAN) 0.005 % ophthalmic solution SMARTSIG:In Eye(s) 08/03/20   [provider]  levocetirizine (XYZAL) 5 MG tablet Take 1 tablet (5 mg total) by mouth every evening. 06/20/22   Shelda Pal, DO  metFORMIN (GLUCOPHAGE-XR) 500 MG 24 hr tablet Take 1 tablet (500 mg total) by mouth daily with breakfast. 08/16/21   Shamleffer, Melanie Crazier, MD  metoprolol succinate (TOPROL-XL) 100 MG 24 hr tablet TAKE 1 AND 1/2 TABLETS BY MOUTH  DAILY 07/06/22   Colon Branch, MD  pravastatin (PRAVACHOL) 80 MG tablet TAKE 1 TABLET BY MOUTH IN THE  EVENING 01/06/22   Minus Breeding, MD  RSV vaccine recomb adjuvanted (AREXVY) 120 MCG/0.5ML injection Inject into the muscle. 08/02/22   Carlyle Basques, MD  Semaglutide (RYBELSUS) 14 MG TABS Take 1 tablet (14 mg total) by mouth daily. 01/17/22   Shamleffer, Melanie Crazier, MD  sucralfate (  CARAFATE) 1 g tablet Take 1 tablet (1 g total) by mouth 4 (four) times daily -  with meals and at bedtime. 06/12/20   Mendel Corning, MD      Allergies    Patient has no known allergies.    Review of Systems   Review of Systems  Constitutional:  Negative for fever.  HENT:  Negative for facial swelling.   Respiratory:  Negative for shortness of breath, wheezing and stridor.   Cardiovascular:  Negative for chest pain.  Gastrointestinal:  Negative for abdominal pain.  Musculoskeletal:  Positive for arthralgias. Negative for back pain.  All other systems reviewed and are negative.   Physical Exam Updated Vital Signs BP (!)  174/67 (BP Location: Left Arm)   Pulse 73   Temp 98.3 F (36.8 C)   Resp 16   Ht '5\' 5"'$  (1.651 m)   Wt 61.2 kg   SpO2 98%   BMI 22.47 kg/m  Physical Exam Vitals and nursing note reviewed.  Constitutional:      General: He is not in acute distress.    Appearance: He is well-developed. He is not diaphoretic.  HENT:     Head: Normocephalic and atraumatic.  Eyes:     Conjunctiva/sclera: Conjunctivae normal.     Pupils: Pupils are equal, round, and reactive to light.  Cardiovascular:     Rate and Rhythm: Normal rate and regular rhythm.  Pulmonary:     Effort: Pulmonary effort is normal.     Breath sounds: Normal breath sounds. No wheezing or rales.  Abdominal:     General: Bowel sounds are normal.     Palpations: Abdomen is soft.     Tenderness: There is no abdominal tenderness. There is no guarding or rebound.  Musculoskeletal:        General: Normal range of motion.     Cervical back: Normal range of motion and neck supple.     Comments: Intact dorsalis pedis   Skin:    General: Skin is warm and dry.     Capillary Refill: Capillary refill takes less than 2 seconds.  Neurological:     General: No focal deficit present.     Mental Status: He is alert and oriented to person, place, and time.     ED Results / Procedures / Treatments   Labs (all labs ordered are listed, but only abnormal results are displayed) Results for orders placed or performed during the hospital encounter of 29/79/89  Basic metabolic panel  Result Value Ref Range   Sodium 137 135 - 145 mmol/L   Potassium 4.0 3.5 - 5.1 mmol/L   Chloride 105 98 - 111 mmol/L   CO2 23 22 - 32 mmol/L   Glucose, Bld 113 (H) 70 - 99 mg/dL   BUN 29 (H) 8 - 23 mg/dL   Creatinine, Ser 1.61 (H) 0.61 - 1.24 mg/dL   Calcium 9.6 8.9 - 10.3 mg/dL   GFR, Estimated 45 (L) >60 mL/min   Anion gap 9 5 - 15   DG Hip Unilat W or Wo Pelvis 2-3 Views Right  Result Date: 08/19/2022 CLINICAL DATA:  Acute right hip pain without known  injury. EXAM: DG HIP (WITH OR WITHOUT PELVIS) 2-3V RIGHT COMPARISON:  January 28, 2014. FINDINGS: There is no evidence of hip fracture or dislocation. There is no evidence of arthropathy or other focal bone abnormality. IMPRESSION: Negative. Electronically Signed   By: Marijo Conception M.D.   On: 08/19/2022 15:18  Radiology DG Hip Unilat W or Wo Pelvis 2-3 Views Right  Result Date: 08/19/2022 CLINICAL DATA:  Acute right hip pain without known injury. EXAM: DG HIP (WITH OR WITHOUT PELVIS) 2-3V RIGHT COMPARISON:  January 28, 2014. FINDINGS: There is no evidence of hip fracture or dislocation. There is no evidence of arthropathy or other focal bone abnormality. IMPRESSION: Negative. Electronically Signed   By: Marijo Conception M.D.   On: 08/19/2022 15:18    Procedures Procedures  {Document cardiac monitor, telemetry assessment procedure when appropriate:1}  Medications Ordered in ED Medications - No data to display  ED Course/ Medical Decision Making/ A&P   {   Click here for ABCD2, HEART and other calculatorsREFRESH Note before signing :1}                          Medical Decision Making Patient with 2 days of R buttock and hip and RLE pain   Amount and/or Complexity of Data Reviewed Independent Historian: spouse    Details: See above  External Data Reviewed: notes.    Details: Previous notes reviewed  Labs: ordered.    Details: Normal sodium 137, normal potassium 4, creatinine slight elevation 1.61  Radiology: ordered.  Risk Risk Details: No arterial clot will set up outpatient DVT study but appears to be sciatica and will treat with lidoderm and tylenol given GFR    Final Clinical Impression(s) / ED Diagnoses Final diagnoses:  None  Return for intractable cough, coughing up blood, fevers > 100.4 unrelieved by medication, shortness of breath, intractable vomiting, chest pain, shortness of breath, weakness, numbness, changes in speech, facial asymmetry, abdominal pain, passing out,  Inability to tolerate liquids or food, cough, altered mental status or any concerns. No signs of systemic illness or infection. The patient is nontoxic-appearing on exam and vital signs are within normal limits.  I have reviewed the triage vital signs and the nursing notes. Pertinent labs & imaging results that were available during my care of the patient were reviewed by me and considered in my medical decision making (see chart for details). After history, exam, and medical workup I feel the patient has been appropriately medically screened and is safe for discharge home. Pertinent diagnoses were discussed with the patient. Patient was given return precautions.  Rx / DC Orders ED Discharge Orders     None

## 2022-08-19 NOTE — ED Notes (Signed)
EDP @ bedside 

## 2022-08-20 ENCOUNTER — Emergency Department (HOSPITAL_BASED_OUTPATIENT_CLINIC_OR_DEPARTMENT_OTHER): Payer: Medicare Other

## 2022-08-20 ENCOUNTER — Emergency Department (HOSPITAL_BASED_OUTPATIENT_CLINIC_OR_DEPARTMENT_OTHER)
Admit: 2022-08-20 | Discharge: 2022-08-20 | Disposition: A | Discharge status: Home / Self Care | Payer: Medicare Other | Attending: Emergency Medicine | Admitting: Emergency Medicine

## 2022-08-20 MED ORDER — LIDOCAINE 5 % EX PTCH: 1.0000 | MEDICATED_PATCH | CUTANEOUS | 0 refills | Status: DC

## 2022-08-20 MED ORDER — IOHEXOL 350 MG/ML SOLN
80.0000 mL | Freq: Once | INTRAVENOUS | Status: AC | PRN
  Administered 2022-08-20: 80 mL via INTRAVENOUS

## 2022-08-22 ENCOUNTER — Telehealth: Payer: Self-pay

## 2022-08-22 NOTE — Telephone Encounter (Signed)
Transition Care Management Unsuccessful Follow-up Telephone Call  Date of discharge and from where:  [facility] ER, 08/20/2022  Attempts:  1st Attempt  Reason for unsuccessful TCM follow-up call:  No answer/busy

## 2022-08-23 NOTE — Telephone Encounter (Signed)
Transition Care Management Unsuccessful Follow-up Telephone Call  Date of discharge and from where:  [facility] ER 08/20/2022  Attempts:  2nd Attempt  Reason for unsuccessful TCM follow-up call:  No answer/busy

## 2022-08-24 NOTE — Telephone Encounter (Signed)
Transition Care Management Unsuccessful Follow-up Telephone Call  Date of discharge and from where:  [facility] ER 08/20/2022  Attempts:  3rd Attempt  Reason for unsuccessful TCM follow-up call:  No answer/busy

## 2022-08-28 ENCOUNTER — Encounter: Payer: Self-pay | Admitting: Internal Medicine

## 2022-08-28 ENCOUNTER — Ambulatory Visit (INDEPENDENT_AMBULATORY_CARE_PROVIDER_SITE_OTHER): Payer: Medicare Other | Admitting: Internal Medicine

## 2022-08-28 VITALS — BP 134/62 | HR 59 | Temp 98.0°F | Resp 16 | Ht 65.0 in | Wt 129.2 lb

## 2022-08-28 DIAGNOSIS — I739 Peripheral vascular disease, unspecified: Secondary | ICD-10-CM | POA: Diagnosis not present

## 2022-08-28 DIAGNOSIS — I70219 Atherosclerosis of native arteries of extremities with intermittent claudication, unspecified extremity: Secondary | ICD-10-CM

## 2022-08-28 DIAGNOSIS — M5417 Radiculopathy, lumbosacral region: Secondary | ICD-10-CM

## 2022-08-28 NOTE — Assessment & Plan Note (Signed)
Right leg pain: As described above, he has a well-known history of peripheral artery disease status post femoropopliteal 2014. On clinical grounds is difficult to say if this is new onset radiculopathy  versus PAD however suspect this is more a radiculopathy (despite pain, the toes are well-perfused) Message sent to Dr Donzetta Matters regards  CT angio, he will d/w pt at next OV Plan: Refer to Ortho, radiculopathy Refer to Vascular, almost due for annual check up  Tylenol, avoid NSAIDs due to renal function.

## 2022-08-28 NOTE — Patient Instructions (Signed)
Tylenol  500 mg OTC 2 tabs a day every 8 hours as needed for pain   We are referring you to orthopedic doctor.  Also referring you to your vascular surgeon  If the pain is severe, you cannot stop it, you see any changes in the color of your extremities please go to the ER

## 2022-08-28 NOTE — Progress Notes (Signed)
Subjective:    Patient ID: Steven [name], male    DOB: [birth year], 77 y.o.   MRN: [id number]  DOS:  08/28/2022 Type of visit - description: ED follow-up  Went to the ER 08/19/2022. Symptoms started 2 days prior to the ER visit.  C/o right-sided back pain with radiation to the buttock and to the whole leg up to the ankle. The patient reports that the pain is better with rest. As soon as he gets up he started hurting again, pain  is even worse when he walks. He denies any fall or injury. No rash. On the ER he had the following tests:  R hip x-ray negative.  CT angio: Heavy atherosclerotic calcifications are noted throughout the arterial tree. Two vessel runoff to the ankles is seen bilaterally. The anterior tibial arteries bilaterally are heavily calcified and diminutive.  Bilateral superficial femoral artery occlusions. A common femoral to popliteal artery bypass is noted on the right which is widely patent. On the left there is reconstitution of the popliteal artery via muscular collaterals below the level of the adductor canal.  NON-VASCULAR  No acute abnormality noted.  Review of Systems See above , no other sxs   Past Medical History:  Diagnosis Date   Allergy    Arthritis    Right Hip   Atrial fibrillation ([name])    BPH (benign prostatic hyperplasia)    COPD (chronic obstructive pulmonary disease) ([place])    Coronary artery disease    Lexiscan Myoview 7/14:  Inf scar with minimal reversibility, inf HK, EF 45%;  Low risk.  Promus LAD stent placed in [place].  100% RCA   Diabetes 1.5, managed as type 1 ([place])    Diverticulosis    on CT   Dyslipidemia    GERD (gastroesophageal reflux disease)    has used Zantac in the past    Glaucoma    Hammer toes, bilateral    Hernia    Hyperlipidemia    Hypertension    Peripheral vascular disease ([place])    a. s/p R fem-pop BPG 01/2013 (Dr. 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   Sickle cell anemia ([place])    has  trait    Past Surgical History:  Procedure Laterality Date   ABDOMINAL AORTAGRAM N/A 12/10/2012   Procedure: ABDOMINAL Maxcine Ham;  Surgeon: Serafina Mitchell, MD;  Location: [facility] CATH LAB;  Service: Cardiovascular;  Laterality: N/A;   CARDIAC CATHETERIZATION     with stent placement   FEMORAL-POPLITEAL BYPASS GRAFT Right 02/07/2013   Procedure: BYPASS GRAFT FEMORAL-POPLITEAL ARTERY- RIGHT;  Surgeon: Mal Misty, MD;  Location: [facility] OR;  Service: Vascular;  Laterality: Right;  Right femoral to below knee popliteal bypass using right non reversed great saphenous vein   INGUINAL HERNIA REPAIR  2010   Right   INGUINAL HERNIA REPAIR  09/11/2018   INTRAOPERATIVE ARTERIOGRAM Right 02/07/2013   Procedure: Attempted INTRA OPERATIVE ARTERIOGRAM;  Surgeon: Mal Misty, MD;  Location: [facility];  Service: Vascular;  Laterality: Right;   IR RETROGRADE PYELOGRAM  12/10/2019   TRANSURETHRAL RESECTION OF PROSTATE  12/10/2019    Current Outpatient Medications  Medication Instructions   amLODipine (NORVASC) 10 mg, Oral, Daily   aspirin EC 81 mg, Daily   blood glucose meter kit and supplies Dispense based on patient and insurance preference. Check blood sugars twice daily. Dx: E11.9   Blood Glucose Monitoring Suppl (ONETOUCH VERIO IQ SYSTEM) w/Device KIT Use to check blood sugar 2 times per day.  Continuous Blood Gluc Sensor (FREESTYLE LIBRE 2 SENSOR) MISC 1 Device, Subcutaneous, Every 14 days   empagliflozin (JARDIANCE) 25 mg, Oral, Daily before breakfast   ezetimibe (ZETIA) 10 mg, Oral, Daily   fluticasone (FLONASE) 50 MCG/ACT nasal spray 2 sprays, Each Nare, Daily   Insulin Pen Needle 32G X 4 MM MISC 1 Device, Does not apply, Daily   ipratropium (ATROVENT) 0.03 % nasal spray 2 sprays, Each Nare, Every 12 hours   latanoprost (XALATAN) 0.005 % ophthalmic solution SMARTSIG:In Eye(s)   levocetirizine (XYZAL) 5 mg, Oral, Every evening   lidocaine (LIDODERM) 5 % 1 patch, Transdermal, Every 24 hours, Remove &  Discard patch within 12 hours or as directed by MD   metFORMIN (GLUCOPHAGE-XR) 500 mg, Oral, Daily with breakfast   metoprolol succinate (TOPROL-XL) 100 MG 24 hr tablet TAKE 1 AND 1/2 TABLETS BY MOUTH  DAILY   pravastatin (PRAVACHOL) 80 MG tablet TAKE 1 TABLET BY MOUTH IN THE  EVENING   Rybelsus 14 mg, Oral, Daily   sucralfate (CARAFATE) 1 g, Oral, 3 times daily with meals & bedtime       Objective:   Physical Exam BP 134/62   Pulse (!) 59   Temp 98 F (36.7 C) (Oral)   Resp 16   Ht '5\' 5"'$  (1.651 m)   Wt 129 lb 4 oz (58.6 kg)   SpO2 99%   BMI 21.51 kg/m  General:   Well developed, NAD, BMI noted.  HEENT:  Normocephalic . Face symmetric, atraumatic   Abdomen:  Not distended, soft, non-tender. No rebound or rigidity.  Bruit noted. Skin: Not pale. Not jaundice Lower extremities: No pretibial edema bilaterally Strong femoral pulses bilaterally. Pedal pulses present. Toes are warm well-perfused throughout. Neurologic:  alert & oriented X3.  Speech normal, gait appropriate for age and unassisted. Transferring: Slightly antalgic, he reported pain when he lay back on the table. Motor symmetric. DTRs present bilaterally  Psych--  Cognition and judgment appear intact.  Cooperative with normal attention span and concentration.  Behavior appropriate. No anxious or depressed appearing.     Assessment     ASSESSMENT (new pt to me 12/2019) DM, first visit with Endo 09/07/2020 HTN Hyperlipidemia CKD: --Creatinine fluctuates 1.70- 1.40 --CT November 2021: Normal kidneys and bladder CV: --CAD, Dr. Percival Spanish --PAD, fem-pop  2014 Sickle cell anemia COPD BPH, TURP and bilateral retrograde pyelograms 12/10/2019, Dr Hazle Nordmann   PLAN Right leg pain: As described above, he has a well-known history of peripheral artery disease status post femoropopliteal 2014. On clinical grounds is difficult to say if this is new onset radiculopathy  versus PAD however suspect this is more a  radiculopathy (despite pain, the toes are well-perfused) Message sent to Dr Donzetta Matters regards  CT angio, he will d/w pt at next OV Plan: Refer to Ortho, radiculopathy Refer to Vascular, almost due for annual check up  Tylenol, avoid NSAIDs due to renal function.   Time spent 34 min, reviewing the chart and coordinating his care

## 2022-09-01 ENCOUNTER — Ambulatory Visit: Payer: Medicare Other | Admitting: Internal Medicine

## 2022-09-05 ENCOUNTER — Other Ambulatory Visit (HOSPITAL_BASED_OUTPATIENT_CLINIC_OR_DEPARTMENT_OTHER): Payer: Self-pay

## 2022-09-05 MED ORDER — SHINGRIX 50 MCG/0.5ML IM SUSR
INTRAMUSCULAR | 1 refills | Status: DC
  Filled 2022-09-05: qty 1, 1d supply, fill #0

## 2022-09-26 DIAGNOSIS — C4491 Basal cell carcinoma of skin, unspecified: Secondary | ICD-10-CM | POA: Insufficient documentation

## 2022-09-28 ENCOUNTER — Other Ambulatory Visit: Payer: Self-pay | Admitting: *Deleted

## 2022-09-28 DIAGNOSIS — I739 Peripheral vascular disease, unspecified: Secondary | ICD-10-CM

## 2022-09-28 DIAGNOSIS — Z95828 Presence of other vascular implants and grafts: Secondary | ICD-10-CM

## 2022-10-02 ENCOUNTER — Ambulatory Visit: Payer: Medicare Other | Admitting: Cardiology

## 2022-10-03 ENCOUNTER — Other Ambulatory Visit: Payer: Self-pay | Admitting: Internal Medicine

## 2022-10-17 ENCOUNTER — Telehealth: Payer: Self-pay

## 2022-10-17 NOTE — Telephone Encounter (Signed)
History of CKD, does not see nephrology.  Last creatinine 1.5, GFR calculated at 49.  Toxicity depends on the contrast use. Recommend preprocedure hydration, use the least amount of contrast possible. If further advice is needed recommend a nephrology consult.

## 2022-10-17 NOTE — Telephone Encounter (Signed)
Form completed and faxed back to Attn:Sandra Patsey Berthold at [phone]. Form sent for scanning.

## 2022-10-17 NOTE — Telephone Encounter (Signed)
Alternate fax: [phone].   Received fax confirmation.

## 2022-10-17 NOTE — Telephone Encounter (Signed)
Received pre-procedure/injection risk assessment for [facility]. Pt is needing R L4-5 and L5-S1 TF ESI in office with Dr. Mina Marble. Date: TBD. Needing okay for IVP contrast during injection d/t kidney disease. Form placed in PCP red folder. Please advise.

## 2022-10-18 ENCOUNTER — Ambulatory Visit (HOSPITAL_COMMUNITY)
Admission: RE | Admit: 2022-10-18 | Discharge: 2022-10-18 | Disposition: A | Discharge status: Home / Self Care | Payer: Medicare Other | Source: Ambulatory Visit | Attending: Vascular Surgery | Admitting: Vascular Surgery

## 2022-10-18 ENCOUNTER — Encounter: Payer: Self-pay | Admitting: Physician Assistant

## 2022-10-18 ENCOUNTER — Ambulatory Visit (INDEPENDENT_AMBULATORY_CARE_PROVIDER_SITE_OTHER)
Admission: RE | Admit: 2022-10-18 | Discharge: 2022-10-18 | Disposition: A | Discharge status: Home / Self Care | Payer: Medicare Other | Source: Ambulatory Visit | Attending: Vascular Surgery | Admitting: Vascular Surgery

## 2022-10-18 ENCOUNTER — Ambulatory Visit: Payer: Medicare Other | Admitting: Physician Assistant

## 2022-10-18 VITALS — BP 137/72 | HR 56 | Temp 97.9°F | Resp 16 | Ht 65.0 in | Wt 127.0 lb

## 2022-10-18 DIAGNOSIS — I739 Peripheral vascular disease, unspecified: Secondary | ICD-10-CM

## 2022-10-18 DIAGNOSIS — Z95828 Presence of other vascular implants and grafts: Secondary | ICD-10-CM | POA: Diagnosis not present

## 2022-10-18 NOTE — Progress Notes (Signed)
Office Note   History of Present Illness   Steven [name] is a 77 y.o. (1945/12/16) male who presents for surveillance of PAD.  He has remote history of right femoropopliteal bypass with greater saphenous vein by Dr. Kellie Simmering in [place] 2014.  This was done for lifestyle limiting claudication.  Since his bypass, his claudication symptoms have completely gone away.  He does have a history of lower back pain and bilateral hip pain when walking.  He also has pain in his right hip that runs down the back of his leg and into his right foot.  He has previously been diagnosed with sciatica.  He has seen a orthopedic doctor, and is currently waiting to schedule back injections to help relieve his sciatica and hip pain.  Other than his sciatic pain, he denies any other pain in his legs.  He denies any rest pain or nonhealing wounds of his lower extremities.  He is still taking a statin and aspirin daily.  Current Outpatient Medications  Medication Sig Dispense Refill   amLODipine (NORVASC) 10 MG tablet TAKE 1 TABLET BY MOUTH DAILY 90 tablet 1   aspirin EC 81 MG tablet Take 81 mg by mouth daily.     blood glucose meter kit and supplies Dispense based on patient and insurance preference. Check blood sugars twice daily. Dx: E11.9 1 each 0   Blood Glucose Monitoring Suppl (ONETOUCH VERIO IQ SYSTEM) w/Device KIT Use to check blood sugar 2 times per day. 1 kit 2   Continuous Blood Gluc Sensor (FREESTYLE LIBRE 2 SENSOR) MISC Inject 1 Device into the skin every 14 (fourteen) days. 6 each 3   empagliflozin (JARDIANCE) 25 MG TABS tablet TAKE 1 TABLET BY MOUTH DAILY  BEFORE BREAKFAST 90 tablet 1   ezetimibe (ZETIA) 10 MG tablet TAKE 1 TABLET BY MOUTH DAILY 90 tablet 1   fluticasone (FLONASE) 50 MCG/ACT nasal spray Place 2 sprays into both nostrils daily. 16 g 3   Insulin Pen Needle 32G X 4 MM MISC 1 Device by Does not apply route daily in the afternoon. 100 each 3   ipratropium (ATROVENT) 0.03 % nasal spray  Place 2 sprays into both nostrils every 12 (twelve) hours. 30 mL 12   latanoprost (XALATAN) 0.005 % ophthalmic solution SMARTSIG:In Eye(s)     levocetirizine (XYZAL) 5 MG tablet Take 1 tablet (5 mg total) by mouth every evening. 30 tablet 2   metFORMIN (GLUCOPHAGE-XR) 500 MG 24 hr tablet Take 1 tablet (500 mg total) by mouth daily with breakfast. 90 tablet 3   metoprolol succinate (TOPROL-XL) 100 MG 24 hr tablet TAKE 1 AND 1/2 TABLETS BY MOUTH  DAILY 135 tablet 1   pravastatin (PRAVACHOL) 80 MG tablet TAKE 1 TABLET BY MOUTH IN THE  EVENING 90 tablet 3   Semaglutide (RYBELSUS) 14 MG TABS Take 1 tablet (14 mg total) by mouth daily. 90 tablet 3   sucralfate (CARAFATE) 1 g tablet Take 1 tablet (1 g total) by mouth 4 (four) times daily -  with meals and at bedtime. 120 tablet 0   Zoster Vaccine Adjuvanted [facility]) injection Inject into the muscle. 1 each 1   lidocaine (LIDODERM) 5 % Place 1 patch onto the skin daily. Remove & Discard patch within 12 hours or as directed by MD (Patient not taking: Reported on 10/18/2022) 30 patch 0   No current facility-administered medications for this visit.    REVIEW OF SYSTEMS (negative unless checked):   Cardiac:  []  Chest  pain or chest pressure? []  Shortness of breath upon activity? []  Shortness of breath when lying flat? []  Irregular heart rhythm?  Vascular:  [x]  Pain in calf, thigh, or hip brought on by walking? []  Pain in feet at night that wakes you up from your sleep? []  Blood clot in your veins? []  Leg swelling?  Pulmonary:  []  Oxygen at home? []  Productive cough? []  Wheezing?  Neurologic:  []  Sudden weakness in arms or legs? []  Sudden numbness in arms or legs? []  Sudden onset of difficult speaking or slurred speech? []  Temporary loss of vision in one eye? []  Problems with dizziness?  Gastrointestinal:  []  Blood in stool? []  Vomited blood?  Genitourinary:  []  Burning when urinating? []  Blood in urine?  Psychiatric:  []  Major  depression  Hematologic:  []  Bleeding problems? []  Problems with blood clotting?  Dermatologic:  []  Rashes or ulcers?  Constitutional:  []  Fever or chills?  Ear/Nose/Throat:  []  Change in hearing? []  Nose bleeds? []  Sore throat?  Musculoskeletal:  [x]  Back pain? []  Joint pain? []  Muscle pain?   Physical Examination   Vitals:   10/18/22 1355  BP: 137/72  Pulse: (!) 56  Resp: 16  Temp: 97.9 F (36.6 C)  TempSrc: Temporal  SpO2: 100%  Weight: 127 lb (57.6 kg)  Height: 5\' 5"  (1.651 m)   Body mass index is 21.13 kg/m.  General:  WDWN in NAD; vital signs documented above Gait: Not observed HENT: WNL, normocephalic Pulmonary: normal non-labored breathing  Cardiac: regular Abdomen: soft, NT, no masses Skin: without rashes Vascular Exam/Pulses: Monophasic left PT/DP Doppler signals.  Monophasic right DP and biphasic right PT Doppler signals. Extremities: without ischemic changes, without Gangrene , without cellulitis; without open wounds;  Musculoskeletal: no muscle wasting or atrophy  Neurologic: A&O X 3;  No focal weakness or paresthesias are detected Psychiatric:  The pt has Normal affect.  Non-Invasive Vascular Imaging ABI (10/18/2022) R:  ABI: 0.89 (0.95),  PT: bi DP: mono TBI: 0.35 L:  ABI: 0.48 (0.50),  PT: mono DP: mono TBI: 0  RLE Bypass Duplex (10/18/2022) Patent right femoropopliteal bypass graft without stenosis.  50% stenosis in the distal right common femoral artery, proximal to bypass inflow.    Medical Decision Making   Steven [name] is a 77 y.o. male who presents for surveillance of PAD  Based on the patient's vascular studies, his ABIs have slightly decreased in both lower extremities since his last visit.  His right ABI is 0.89 and left ABI is 0.48.  He has biphasic flow in the right PT and monophasic flow in the right DP Duplex of the right femoropopliteal bypass graft demonstrates a patent graft without stenosis.  There is  moderate stenosis in the distal right common femoral artery, which is proximal to the bypass.  This stenosis has been seen on previous duplex studies and is unchanged He denies any claudication, rest pain, nonhealing wounds of lower extremities.  He does have continued bilateral hip pain and sciatica in his right leg.  He will be scheduling injections in his back seem to help with his pain He has brisk Doppler signals in both feet on exam today He can continue his aspirin and statin.  He will follow-up with our office in 1 year with repeat ABIs and right lower extremity bypass graft duplex study   Vicente Serene PA-C [facility]: [phone]  Call MD: Scot Dock

## 2022-10-19 LAB — VAS US ABI WITH/WO TBI
Left ABI: 0.48
Right ABI: 0.89

## 2022-10-29 NOTE — Progress Notes (Unsigned)
  Cardiology Office Note:   Date:  10/30/2022  ID:  Steven [name], DOB January 01, 1946, MRN [id number]  History of Present Illness:   Steven [name] is a 77 y.o. male who presents for follow up of CAD and coronary stent.   Since I last saw him he has done OK.  The patient denies any new symptoms such as chest discomfort, neck or arm discomfort. There has been no new shortness of breath, PND or orthopnea. There have been no reported palpitations, presyncope or syncope. Unfortunately, he had has had back problems.  He is awaiting injections for a ruptured disc.    ROS: Sciatic nerve pain.  Otherwise, as stated in the HPI and negative for all other systems.   Studies Reviewed:    EKG: Sinus rhythm, rate 68, axis within normal limits, intervals within normal limits, no change from previous.  Risk Assessment/Calculations:     Physical Exam:   VS:  BP (!) 168/72   Pulse 68   Ht 5\' 5"  (1.651 m)   Wt 131 lb (59.4 kg)   SpO2 97%   BMI 21.80 kg/m    Wt Readings from Last 3 Encounters:  10/30/22 131 lb (59.4 kg)  10/18/22 127 lb (57.6 kg)  08/28/22 129 lb 4 oz (58.6 kg)     GEN: Well nourished, well developed in no acute distress NECK: No JVD; No carotid bruits CARDIAC: RRR, no murmurs, rubs, gallops RESPIRATORY:  Clear to auscultation without rales, wheezing or rhonchi  ABDOMEN: Soft, non-tender, non-distended EXTREMITIES:  No edema; No deformity , bilateral femoral bruits, absent dorsalis pedis and posterior tibialis  ASSESSMENT AND PLAN:   CAD  The patient has no new sypmtoms.  No further cardiovascular testing is indicated.  We will continue with aggressive risk reduction and meds as listed.   Dyslipidemia His LDL was 68 with an HDL of 59.  I am going to stop his pravastatin and start Crestor 20 mg daily and repeat a lipid profile with an LP(a) in 3 months.    HTN The blood pressure is elevated.  He says it is well-controlled at home and has been on previous readings.  He is going to  keep a blood pressure diary.     CKD Previous creatinine was down to 1.61 from 1.76 .   DM His hemoglobin A1c was 7.6 today followed by endocrinology.    LVH He has moderate LVH on echo in 2022.  He will manage this by checking periodic echocardiograms.    PVD He is followed by [facility].           Signed, Rollene Rotunda, MD

## 2022-10-30 ENCOUNTER — Encounter: Payer: Self-pay | Admitting: Cardiology

## 2022-10-30 ENCOUNTER — Ambulatory Visit: Payer: Medicare Other | Attending: Cardiology | Admitting: Cardiology

## 2022-10-30 VITALS — BP 132/72 | HR 68 | Ht 65.0 in | Wt 131.0 lb

## 2022-10-30 DIAGNOSIS — Z794 Long term (current) use of insulin: Secondary | ICD-10-CM

## 2022-10-30 DIAGNOSIS — E1142 Type 2 diabetes mellitus with diabetic polyneuropathy: Secondary | ICD-10-CM

## 2022-10-30 DIAGNOSIS — I517 Cardiomegaly: Secondary | ICD-10-CM

## 2022-10-30 DIAGNOSIS — I2510 Atherosclerotic heart disease of native coronary artery without angina pectoris: Secondary | ICD-10-CM | POA: Diagnosis not present

## 2022-10-30 DIAGNOSIS — E785 Hyperlipidemia, unspecified: Secondary | ICD-10-CM | POA: Diagnosis not present

## 2022-10-30 DIAGNOSIS — I10 Essential (primary) hypertension: Secondary | ICD-10-CM

## 2022-10-30 MED ORDER — ROSUVASTATIN CALCIUM 20 MG PO TABS
20.0000 mg | ORAL_TABLET | Freq: Every day | ORAL | 3 refills | Status: DC
  Filled 2022-12-26 – 2023-01-31 (×2): qty 90, 90d supply, fill #0
  Filled 2023-05-07: qty 90, 90d supply, fill #1
  Filled 2023-08-03: qty 90, 90d supply, fill #2

## 2022-10-30 NOTE — Patient Instructions (Signed)
Medication Instructions:  Your physician has recommended you make the following change in your medication:   -Stop taking pravastation (pravachol).  -Start taking rosuvastatin (crestor) 20mg  once daily  *If you need a refill on your cardiac medications before your next appointment, please call your pharmacy*   Lab Work: Your physician recommends that you return for lab work in: 3 months for FASTING Lipid & LPa  If you have labs (blood work) drawn today and your tests are completely normal, you will receive your results only by: MyChart Message (if you have MyChart) OR A paper copy in the mail If you have any lab test that is abnormal or we need to change your treatment, we will call you to review the results.   Follow-Up: At [facility], you and your health needs are our priority.  As part of our continuing mission to provide you with exceptional heart care, we have created designated Provider Care Teams.  These Care Teams include your primary Cardiologist (physician) and Advanced Practice Providers (APPs -  Physician Assistants and Nurse Practitioners) who all work together to provide you with the care you need, when you need it.  We recommend signing up for the patient portal called "MyChart".  Sign up information is provided on this After Visit Summary.  MyChart is used to connect with patients for Virtual Visits (Telemedicine).  Patients are able to view lab/test results, encounter notes, upcoming appointments, etc.  Non-urgent messages can be sent to your provider as well.   To learn more about what you can do with MyChart, go to [email or web address].    Your next appointment:   12 month(s)  Provider:   Rollene Rotunda, MD     Other Instructions Please keep a blood pressure log and send readings via mychart.

## 2022-11-28 ENCOUNTER — Ambulatory Visit: Payer: Medicare Other | Admitting: Internal Medicine

## 2022-11-28 ENCOUNTER — Encounter: Payer: Self-pay | Admitting: Internal Medicine

## 2022-11-28 VITALS — BP 134/56 | HR 72 | Temp 98.1°F | Resp 16 | Ht 65.0 in | Wt 127.2 lb

## 2022-11-28 DIAGNOSIS — E1122 Type 2 diabetes mellitus with diabetic chronic kidney disease: Secondary | ICD-10-CM | POA: Diagnosis not present

## 2022-11-28 DIAGNOSIS — J300 Vasomotor rhinitis: Secondary | ICD-10-CM

## 2022-11-28 DIAGNOSIS — N182 Chronic kidney disease, stage 2 (mild): Secondary | ICD-10-CM

## 2022-11-28 DIAGNOSIS — Z794 Long term (current) use of insulin: Secondary | ICD-10-CM

## 2022-11-28 DIAGNOSIS — N1832 Chronic kidney disease, stage 3b: Secondary | ICD-10-CM

## 2022-11-28 DIAGNOSIS — N1831 Chronic kidney disease, stage 3a: Secondary | ICD-10-CM

## 2022-11-28 DIAGNOSIS — M5417 Radiculopathy, lumbosacral region: Secondary | ICD-10-CM

## 2022-11-28 DIAGNOSIS — Z7984 Long term (current) use of oral hypoglycemic drugs: Secondary | ICD-10-CM

## 2022-11-28 NOTE — Patient Instructions (Addendum)
   GO TO THE LAB : Get the blood work     GO TO THE FRONT DESK, PLEASE SCHEDULE YOUR APPOINTMENTS Come back for a physical exam in 5 months      Vaccines I recommend:  Shingrix (shingles)  Please bring [place] a copy of your Healthcare Power of Attorney for your chart.

## 2022-11-28 NOTE — Progress Notes (Addendum)
Subjective:    Patient ID: Steven [name], male    DOB: 1946-05-04, 77 y.o.   MRN: [id number]  DOS:  11/28/2022 Type of visit - description: rov  Chronic medical problems reviewed. Saw cardiology, note reviewed. Ongoing problems with low back pain and sciatic. Still bothered by nasal congestion, not improved even with multiple sprays. Ambulatory CBGs reviewed.  BP Readings from Last 3 Encounters:  11/28/22 (!) 134/56  10/30/22 132/72  10/18/22 137/72     Review of Systems Denies chest pain no difficulty breathing. No cough or sputum production No dysuria, gross hematuria.  Urinary flow is okay.   Past Medical History:  Diagnosis Date   Allergy    Arthritis    Right Hip   Atrial fibrillation ([name])    BCC (basal cell carcinoma of skin)    BPH (benign prostatic hyperplasia)    COPD (chronic obstructive pulmonary disease) ([name])    Coronary artery disease    Lexiscan Myoview 7/14:  Inf scar with minimal reversibility, inf HK, EF 45%;  Low risk.  Promus LAD stent placed in [place].  100% RCA   Diabetes 1.5, managed as type 1 ([name])    Diverticulosis    on CT   Dyslipidemia    GERD (gastroesophageal reflux disease)    has used Zantac in the past    Glaucoma    Hammer toes, bilateral    Hernia    Hyperlipidemia    Hypertension    Peripheral vascular disease ([name])    a. s/p R fem-pop BPG 01/2013 (Dr. 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    Past Surgical History:  Procedure Laterality Date   ABDOMINAL AORTAGRAM N/A 12/10/2012   Procedure: ABDOMINAL Ronny Flurry;  Surgeon: Nada Libman, MD;  Location: [facility] CATH LAB;  Service: Cardiovascular;  Laterality: N/A;   CARDIAC CATHETERIZATION     with stent placement   FEMORAL-POPLITEAL BYPASS GRAFT Right 02/07/2013   Procedure: BYPASS GRAFT FEMORAL-POPLITEAL ARTERY- RIGHT;  Surgeon: Pryor Ochoa, MD;  Location: [facility] OR;  Service: Vascular;  Laterality: Right;  Right femoral to  below knee popliteal bypass using right non reversed great saphenous vein   INGUINAL HERNIA REPAIR  2010   Right   INGUINAL HERNIA REPAIR  09/11/2018   INTRAOPERATIVE ARTERIOGRAM Right 02/07/2013   Procedure: Attempted INTRA OPERATIVE ARTERIOGRAM;  Surgeon: Pryor Ochoa, MD;  Location: [facility] OR;  Service: Vascular;  Laterality: Right;   IR RETROGRADE PYELOGRAM  12/10/2019   TRANSURETHRAL RESECTION OF PROSTATE  12/10/2019    Current Outpatient Medications  Medication Instructions   amLODipine (NORVASC) 10 mg, Oral, Daily   aspirin EC 81 mg, Daily   blood glucose meter kit and supplies Dispense based on patient and insurance preference. Check blood sugars twice daily. Dx: E11.9   Blood Glucose Monitoring Suppl (ONETOUCH VERIO IQ SYSTEM) w/Device KIT Use to check blood sugar 2 times per day.   Continuous Blood Gluc Sensor (FREESTYLE LIBRE 2 SENSOR) MISC 1 Device, Subcutaneous, Every 14 days   ezetimibe (ZETIA) 10 mg, Oral, Daily   fluticasone (FLONASE) 50 MCG/ACT nasal spray 2 sprays, Each Nare, Daily   Insulin Pen Needle 32G X 4 MM MISC 1 Device, Does not apply, Daily   ipratropium (ATROVENT) 0.03 % nasal spray 2 sprays, Each Nare, Every 12 hours   Jardiance 25 mg, Oral, Daily before breakfast   latanoprost (XALATAN) 0.005 % ophthalmic solution SMARTSIG:In Eye(s)  lidocaine (LIDODERM) 5 % 1 patch, Transdermal, Every 24 hours, Remove & Discard patch within 12 hours or as directed by MD   metFORMIN (GLUCOPHAGE-XR) 500 mg, Oral, Daily with breakfast   metoprolol succinate (TOPROL-XL) 100 MG 24 hr tablet TAKE 1 AND 1/2 TABLETS BY MOUTH  DAILY   rosuvastatin (CRESTOR) 20 mg, Oral, Daily   Rybelsus 14 mg, Oral, Daily   sucralfate (CARAFATE) 1 g, Oral, 3 times daily with meals & bedtime       Objective:   Physical Exam BP (!) 134/56   Pulse 72   Temp 98.1 F (36.7 C) (Oral)   Resp 16   Ht 5\' 5"  (1.651 m)   Wt 127 lb 4 oz (57.7 kg)   SpO2 96%   BMI 21.18 kg/m  General:   Well  developed, NAD, BMI noted. HEENT:  Normocephalic . Face symmetric, atraumatic. Lungs:  CTA B Normal respiratory effort, no intercostal retractions, no accessory muscle use. Heart: RRR,  no murmur.  Lower extremities: no pretibial edema bilaterally  Skin: Not pale. Not jaundice Neurologic:  alert & oriented X3.  Speech normal, gait appropriate for age and unassisted Psych--  Cognition and judgment appear intact.  Cooperative with normal attention span and concentration.  Behavior appropriate. No anxious or depressed appearing.      Assessment    ASSESSMENT (new pt to me 12/2019) DM, first visit with Endo 09/07/2020 HTN Hyperlipidemia CKD: --Creatinine fluctuates 1.70- 1.40 --CT November 2021: Normal kidneys and bladder CV: --CAD, Dr. Antoine Poche --PAD, fem-pop  2014 Sickle cell anemia COPD BPH, TURP and bilateral retrograde pyelograms 12/10/2019, Dr Jeralyn Bennett   PLAN DM: Will see endocrinology in few days, in preparation for the visit with check A1c.  Reports his " time in range" is ~ 64% and the average CBG 174. HTN: BP is in the 130s, continue amlodipine, metoprolol. CKD 3b:I thought that he saw nephrology 11/2020 but on further chart review he has not seen them.  Check a BMP, renal referral placed Hyperlipidemia: Saw cardiology April 8, they change pravastatin to Crestor and  planning to check lipid panel with LP (a) in 3 months CAD: Asymptomatic. 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  He reports occasionally bloody discharge from the right nostril.  Referred to ENT. Back pain with radiculopathy, now on the right care of Ortho, patient reports had two ESI, doing PT.  Still having symptoms. RTC 5 months CPX

## 2022-11-29 LAB — BASIC METABOLIC PANEL
BUN: 21 mg/dL (ref 6–23)
CO2: 28 mEq/L (ref 19–32)
Calcium: 9.5 mg/dL (ref 8.4–10.5)
Chloride: 103 mEq/L (ref 96–112)
Creatinine, Ser: 1.77 mg/dL — ABNORMAL HIGH (ref 0.40–1.50)
GFR: 37.33 mL/min — ABNORMAL LOW (ref >60.00)
Glucose, Bld: 203 mg/dL — ABNORMAL HIGH (ref 70–99)
Potassium: 4.5 mEq/L (ref 3.5–5.1)
Sodium: 139 mEq/L (ref 135–145)

## 2022-11-29 LAB — HEMOGLOBIN A1C: Hgb A1c MFr Bld: 8 % — ABNORMAL HIGH (ref 4.6–6.5)

## 2022-11-29 NOTE — Assessment & Plan Note (Addendum)
DM: Will see endocrinology in few days, in preparation for the visit with check A1c.  Reports his " time in range" is ~ 64% and the average CBG 174. HTN: BP is in the 130s, continue amlodipine, metoprolol. CKD 3b:I thought that he saw nephrology 11/2020 but on further chart review he has not seen them.  Check a BMP, renal referral placed Hyperlipidemia: Saw cardiology April 8, they change pravastatin to Crestor and  planning to check lipid panel with LP (a) in 3 months CAD: Asymptomatic. 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  He reports occasionally bloody discharge from the right nostril.  Referred to ENT. Back pain with radiculopathy, now on the right care of Ortho, patient reports had two ESI, doing PT.  Still having symptoms. RTC 5 months CPX

## 2022-12-01 ENCOUNTER — Encounter: Payer: Self-pay | Admitting: Internal Medicine

## 2022-12-01 ENCOUNTER — Ambulatory Visit: Payer: Medicare Other | Admitting: Internal Medicine

## 2022-12-01 VITALS — BP 134/86 | HR 68 | Ht 65.0 in | Wt 130.0 lb

## 2022-12-01 DIAGNOSIS — N1832 Chronic kidney disease, stage 3b: Secondary | ICD-10-CM | POA: Diagnosis not present

## 2022-12-01 DIAGNOSIS — E1122 Type 2 diabetes mellitus with diabetic chronic kidney disease: Secondary | ICD-10-CM

## 2022-12-01 DIAGNOSIS — E1165 Type 2 diabetes mellitus with hyperglycemia: Secondary | ICD-10-CM | POA: Diagnosis not present

## 2022-12-01 DIAGNOSIS — Z7984 Long term (current) use of oral hypoglycemic drugs: Secondary | ICD-10-CM

## 2022-12-01 DIAGNOSIS — E1142 Type 2 diabetes mellitus with diabetic polyneuropathy: Secondary | ICD-10-CM | POA: Diagnosis not present

## 2022-12-01 MED ORDER — REPAGLINIDE 0.5 MG PO TABS
0.5000 mg | ORAL_TABLET | Freq: Every day | ORAL | 3 refills | Status: DC
  Filled 2022-12-26 – 2023-03-01 (×4): qty 90, 90d supply, fill #0
  Filled 2023-05-22: qty 90, 90d supply, fill #1
  Filled 2023-08-27: qty 90, 90d supply, fill #2

## 2022-12-01 MED ORDER — EMPAGLIFLOZIN 25 MG PO TABS
25.0000 mg | ORAL_TABLET | Freq: Every day | ORAL | 3 refills | Status: DC
  Filled 2022-12-26 – 2023-02-28 (×2): qty 90, 90d supply, fill #0
  Filled 2023-06-12: qty 90, 90d supply, fill #1
  Filled 2023-09-02: qty 90, 90d supply, fill #2

## 2022-12-01 MED ORDER — RYBELSUS 14 MG PO TABS
14.0000 mg | ORAL_TABLET | Freq: Every day | ORAL | 3 refills | Status: DC
  Filled 2022-12-26: qty 90, 90d supply, fill #0
  Filled 2023-03-28: qty 90, 90d supply, fill #1
  Filled 2023-06-27: qty 90, 90d supply, fill #2
  Filled 2023-09-28: qty 90, 90d supply, fill #3

## 2022-12-01 NOTE — Progress Notes (Signed)
Name: Steven [name]  Age/ Sex: 77 y.o., male   MRN/ DOB: [id number], [birth year]     PCP: Wanda Plump, MD   Reason for Endocrinology Evaluation: Type 2 Diabetes Mellitus  Initial Endocrine Consultative Visit: 09/07/2020    PATIENT IDENTIFIER: Steven [name] is a 77 y.o. male with a past medical history of T2DM, A.Fibrillation, COPD CAD and Dyslipidemia. The patient has followed with Endocrinology clinic since 09/07/2020 for consultative assistance with management of his diabetes.    DIABETIC HISTORY:  Mr. Latronica was diagnosed with DM in 1977, Has been on Metformin- no intolerance but had to be stopped while in hospital. Has been on insulin since 2014. His hemoglobin A1c has ranged from  in 7.0% in 2021, peaking at 8.4% in 2016.   He was seen by Dr. Everardo All in 2018 but was lost to follow-up until his return to our clinic in 2022.  On his initial visit with me he had an A1c of 8.1% he was on Jardiance and Toujeo, his GFR was 46 at the time and we restarted 50% dose of metformin we also increase his Jardiance and continued insulin.   Rybelsus started 04/2021  Pt self discontinued 12 units of Toujeo 08/2022 due to hypoglycemia  Metformin stopped 11/2022 with GFR 37 Repaglinide started 11/2022 with an A1c 8.0%   SUBJECTIVE:   During the last visit (01/17/2022): A1c 7.6 %    Today (12/01/2022): [name]  is here for diabetes management  He checks his blood sugars multiple  times daily, through CGM . The patient has  had hypoglycemic episodes since the last clinic visit.  He is not symptomatic with these episodes.  Denies nausea, vomiting or diarrhea  He has been following up with [facility] for back pain Patient had a follow-up with cardiology 10/2022 on CAD, pravastatin was switched to rosuvastatin   He self discontinued  Toujeo 08/2022 due to hypoglycemia Denies nausea, vomiting  Denies constipation  or diarrhea    HOME DIABETES REGIMEN:  Metformin  500 mg XR, daily  Jardiance 25 mg, 1 tablet with Breakfast Toujeo 12 units daily - not taking  Rybelsus 14 mg daily        Statin: yes ACE-I/ARB: no     CONTINUOUS GLUCOSE MONITORING RECORD INTERPRETATION    Dates of Recording: 4/2-5/04/2023  Sensor description: freestyle libre  Results statistics:   CGM use % of time 73  Average and SD 171/24.6  Time in range  69%  % Time Above 180 125  % Time above 250 6  % Time Below target 0    Glycemic patterns summary: Hyperglycemia noted during the day postprandial with optimal BG's at night   Hyperglycemic episodes  postprandial   Hypoglycemic episodes occurredn/a  Overnight periods: optimal      DIABETIC COMPLICATIONS: Microvascular complications:  Neuropathy, CKD III Denies: retinopathy  Last eye exam: Completed2/27/2024   Macrovascular complications:  CAD Denies:  PVD, CVA   HISTORY:  Past Medical History:  Past Medical History:  Diagnosis Date   Allergy    Arthritis    Right Hip   Atrial fibrillation ([name])    BCC (basal cell carcinoma of skin)    BPH (benign prostatic hyperplasia)    COPD (chronic obstructive pulmonary disease) ([name])    Coronary artery disease    Lexiscan Myoview 7/14:  Inf scar with minimal reversibility, inf HK, EF 45%;  Low risk.  Promus LAD stent placed in [place].  100% RCA  Diabetes 1.5, managed as type 1 ([name])    Diverticulosis    on CT   Dyslipidemia    GERD (gastroesophageal reflux disease)    has used Zantac in the past    Glaucoma    Hammer toes, bilateral    Hernia    Hyperlipidemia    Hypertension    Peripheral vascular disease ([name])    a. s/p R fem-pop BPG 01/2013 (Dr. 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   Past Surgical History:  Past Surgical History:  Procedure Laterality Date   ABDOMINAL AORTAGRAM N/A 12/10/2012   Procedure: ABDOMINAL Ronny Flurry;  Surgeon: Nada Libman, MD;  Location: [facility] CATH LAB;   Service: Cardiovascular;  Laterality: N/A;   CARDIAC CATHETERIZATION     with stent placement   FEMORAL-POPLITEAL BYPASS GRAFT Right 02/07/2013   Procedure: BYPASS GRAFT FEMORAL-POPLITEAL ARTERY- RIGHT;  Surgeon: Pryor Ochoa, MD;  Location: [facility] OR;  Service: Vascular;  Laterality: Right;  Right femoral to below knee popliteal bypass using right non reversed great saphenous vein   INGUINAL HERNIA REPAIR  2010   Right   INGUINAL HERNIA REPAIR  09/11/2018   INTRAOPERATIVE ARTERIOGRAM Right 02/07/2013   Procedure: Attempted INTRA OPERATIVE ARTERIOGRAM;  Surgeon: Pryor Ochoa, MD;  Location: [facility] OR;  Service: Vascular;  Laterality: Right;   IR RETROGRADE PYELOGRAM  12/10/2019   TRANSURETHRAL RESECTION OF PROSTATE  12/10/2019   Social History:  reports that he quit smoking about 11 years ago. His smoking use included cigarettes. He has a 15.00 pack-year smoking history. He has never been exposed to tobacco smoke. He has never used smokeless tobacco. He reports that he does not currently use alcohol. He reports that he does not use drugs. Family History:  Family History  Problem Relation Age of Onset   Diabetes Mother    Hypertension Mother    Heart disease Mother    Hyperlipidemia Mother    Diabetes Father    Heart disease Father    Hyperlipidemia Father    Hypertension Father    Diabetes Sister    Heart disease Sister    Hyperlipidemia Sister    Hypertension Sister    Diabetes Brother    Heart disease Brother    Hyperlipidemia Brother    Hypertension Brother    Diabetes Son    Heart disease Son    Hyperlipidemia Son    Hypertension Son    Pancreatic cancer Son    Alcohol abuse Neg Hx    Cancer Neg Hx    Depression Neg Hx    Early death Neg Hx    Kidney disease Neg Hx    Stroke Neg Hx    Colon cancer Neg Hx    Esophageal cancer Neg Hx    Rectal cancer Neg Hx    Stomach cancer Neg Hx    Liver cancer Neg Hx      HOME MEDICATIONS: Allergies as of 12/01/2022   No Known  Allergies      Medication List        Accurate as of Dec 01, 2022  8:32 AM. If you have any questions, ask your nurse or doctor.          STOP taking these medications    Insulin Pen Needle 32G X 4 MM Misc Stopped by: Scarlette Shorts, MD   metFORMIN 500 MG 24 hr tablet Commonly known as: GLUCOPHAGE-XR Stopped by: Johnney Ou  Malike Foglio, MD       TAKE these medications    amLODipine 10 MG tablet Commonly known as: NORVASC TAKE 1 TABLET BY MOUTH DAILY   aspirin EC 81 MG tablet Take 81 mg by mouth daily.   blood glucose meter kit and supplies Dispense based on patient and insurance preference. Check blood sugars twice daily. Dx: E11.9   Cymbalta 30 MG capsule Generic drug: DULoxetine Take 30 mg by mouth daily.   empagliflozin 25 MG Tabs tablet Commonly known as: Jardiance Take 1 tablet (25 mg total) by mouth daily before breakfast.   ezetimibe 10 MG tablet Commonly known as: ZETIA TAKE 1 TABLET BY MOUTH DAILY   fluticasone 50 MCG/ACT nasal spray Commonly known as: FLONASE Place 2 sprays into both nostrils daily.   FreeStyle Libre 2 Sensor Misc Inject 1 Device into the skin every 14 (fourteen) days.   ipratropium 0.03 % nasal spray Commonly known as: ATROVENT Place 2 sprays into both nostrils every 12 (twelve) hours.   latanoprost 0.005 % ophthalmic solution Commonly known as: XALATAN SMARTSIG:In Eye(s)   lidocaine 5 % Commonly known as: Lidoderm Place 1 patch onto the skin daily. Remove & Discard patch within 12 hours or as directed by MD   metoprolol succinate 100 MG 24 hr tablet Commonly known as: TOPROL-XL TAKE 1 AND 1/2 TABLETS BY MOUTH  DAILY   OneTouch Verio IQ System w/Device Kit Use to check blood sugar 2 times per day.   repaglinide 0.5 MG tablet Commonly known as: PRANDIN Take 1 tablet (0.5 mg total) by mouth daily before breakfast. Started by: Scarlette Shorts, MD   rosuvastatin 20 MG tablet Commonly known as:  CRESTOR Take 1 tablet (20 mg total) by mouth daily.   Rybelsus 14 MG Tabs Generic drug: Semaglutide Take 1 tablet (14 mg total) by mouth daily.   sucralfate 1 g tablet Commonly known as: CARAFATE Take 1 tablet (1 g total) by mouth 4 (four) times daily -  with meals and at bedtime.         OBJECTIVE:   Vital Signs: BP 134/86 (BP Location: Left Arm, Patient Position: Sitting, Cuff Size: Small)   Pulse 68   Ht 5\' 5"  (1.651 m)   Wt 130 lb (59 kg)   SpO2 95%   BMI 21.63 kg/m   Wt Readings from Last 3 Encounters:  12/01/22 130 lb (59 kg)  11/28/22 127 lb 4 oz (57.7 kg)  10/30/22 131 lb (59.4 kg)     Exam: General: Pt appears well and is in NAD  Lungs: Clear with good BS bilat with no rales, rhonchi, or wheezes  Heart: RRR  Extremities: No pretibial edema.   Neuro: MS is good with appropriate affect, pt is alert and Ox3   DM foot exam: 12/01/2022   The skin of the feet is intact without sores or ulcerations. The pedal pulses are 1+ The sensation is intact to a screening 5.07, 10 gram monofilament bilaterally       DATA REVIEWED:  Lab Results  Component Value Date   HGBA1C 8.0 (H) 11/28/2022   HGBA1C 7.6 (H) 07/27/2022   HGBA1C 7.6 (A) 01/17/2022     Latest Reference Range & Units 11/28/22 14:13  Sodium 135 - 145 mEq/L 139  Potassium 3.5 - 5.1 mEq/L 4.5  Chloride 96 - 112 mEq/L 103  CO2 19 - 32 mEq/L 28  Glucose 70 - 99 mg/dL 161 (H)  BUN 6 - 23 mg/dL 21  Creatinine 0.96 -  1.50 mg/dL 1.61 (H)  Calcium 8.4 - 10.5 mg/dL 9.5  GFR >09.60 mL/min 37.33 (L)    Old records , labs and images have been reviewed.    ASSESSMENT / PLAN / RECOMMENDATIONS:   1) Type 2 Diabetes Mellitus, Poorly Control, With Neuropathic, CKD III and macrovascular complications - Most recent A1c of 8.0 %. Goal A1c < 7.0 %.      -Pt with worsening glycemic control, he has self discontinued 12 units of Toujeo ~ 08/2022 , he also have been receiving glucocorticoid injections for back  issues  - In reviewing CGM, pt has been noted with postprandial hyperglycemia with optimal BG's at night  - I have recommended repaglinide before breakfast for now, we discussed low carb diet  - Will D/C metformin due to worsening GFR and close to the cut off 35   MEDICATIONS: Stop  Metformin 500 mg XR, with Breakfast  Start Repaglinide 0.5 mg, 1 tablet before Breakfast  Continue  Rybelsus 14 mg daily  Continue  Jardiance 25 mg, 1 tablet with Breakfast    EDUCATION / INSTRUCTIONS: BG monitoring instructions: Patient is instructed to check his blood sugars 3 times a day, before meals. Call [facility] [facility] if: BG persistently < 70  I reviewed the Rule of 15 for the treatment of hypoglycemia in detail with the patient. Literature supplied.   2) Diabetic complications:  Eye: Does not have known diabetic retinopathy.  Neuro/ Feet: Does have known diabetic peripheral neuropathy .  Renal: Patient does have known baseline CKD. He is under the care of nephrology    F/U in 4 months   Signed electronically by: Lyndle Herrlich, MD  [facility] Endocrinology  [facility] [address] [place], [place] [address] Phone: [phone] FAX: [phone]   CC: Wanda Plump, MD [address] [place] [address] Phone: [phone]  Fax: [phone]  Return to Endocrinology clinic as below: Future Appointments  Date Time Provider Department Center  03/08/2023 12:10 PM Janaia Kozel, Konrad Dolores, MD [facility] None  05/16/2023 11:00 AM Wanda Plump, MD [facility]

## 2022-12-01 NOTE — Patient Instructions (Signed)
-   STOP Metformin  - Start Rapaglinide 0.5 mg, 1 tablet before Breakfast  - Continue Rybelsus 14 mg, 1 tablet daily before Breakfast  - Continue  Jardiance 25 mg, 1 tablet with Breakfast     HOW TO TREAT LOW BLOOD SUGARS (Blood sugar LESS THAN 70 MG/DL) Please follow the RULE OF 15 for the treatment of hypoglycemia treatment (when your (blood sugars are less than 70 mg/dL)   STEP 1: Take 15 grams of carbohydrates when your blood sugar is low, which includes:  3-4 GLUCOSE TABS  OR 3-4 OZ OF JUICE OR REGULAR SODA OR ONE TUBE OF GLUCOSE GEL    STEP 2: RECHECK blood sugar in 15 MINUTES STEP 3: If your blood sugar is still low at the 15 minute recheck --> then, go back to STEP 1 and treat AGAIN with another 15 grams of carbohydrates.

## 2022-12-07 ENCOUNTER — Telehealth: Payer: Self-pay | Admitting: Internal Medicine

## 2022-12-07 ENCOUNTER — Other Ambulatory Visit (HOSPITAL_BASED_OUTPATIENT_CLINIC_OR_DEPARTMENT_OTHER): Payer: Self-pay

## 2022-12-07 NOTE — Telephone Encounter (Signed)
Please update patient's pharmacy to [facility] instead of [facility].

## 2022-12-07 NOTE — Telephone Encounter (Signed)
Pharmacy updated.

## 2022-12-11 ENCOUNTER — Ambulatory Visit: Payer: Medicare Other | Admitting: Internal Medicine

## 2022-12-14 ENCOUNTER — Other Ambulatory Visit: Payer: Self-pay | Admitting: Internal Medicine

## 2022-12-26 ENCOUNTER — Other Ambulatory Visit (HOSPITAL_BASED_OUTPATIENT_CLINIC_OR_DEPARTMENT_OTHER): Payer: Self-pay

## 2022-12-26 MED ORDER — INSULIN GLARGINE (1 UNIT DIAL) 300 UNIT/ML ~~LOC~~ SOPN
12.0000 [IU] | PEN_INJECTOR | Freq: Every day | SUBCUTANEOUS | 3 refills | Status: DC
  Filled 2022-12-26: qty 4.5, 113d supply, fill #0

## 2022-12-26 MED FILL — Continuous Glucose System Sensor: 28 days supply | Qty: 2 | Fill #0 | Status: CN

## 2022-12-26 MED FILL — Amlodipine Besylate Tab 10 MG (Base Equivalent): ORAL | 90 days supply | Qty: 90 | Fill #0 | Status: CN

## 2022-12-26 MED FILL — Ezetimibe Tab 10 MG: ORAL | 90 days supply | Qty: 90 | Fill #0 | Status: CN

## 2022-12-27 ENCOUNTER — Telehealth: Payer: Self-pay | Admitting: Internal Medicine

## 2022-12-27 ENCOUNTER — Other Ambulatory Visit (HOSPITAL_BASED_OUTPATIENT_CLINIC_OR_DEPARTMENT_OTHER): Payer: Self-pay

## 2022-12-27 MED ORDER — EZETIMIBE 10 MG PO TABS
10.0000 mg | ORAL_TABLET | Freq: Every day | ORAL | 1 refills | Status: DC
  Filled 2022-12-27 – 2023-01-04 (×2): qty 90, 90d supply, fill #0
  Filled 2023-05-01: qty 90, 90d supply, fill #1

## 2022-12-27 MED ORDER — AMLODIPINE BESYLATE 10 MG PO TABS
10.0000 mg | ORAL_TABLET | Freq: Every day | ORAL | 1 refills | Status: DC
  Filled 2022-12-27 – 2023-01-04 (×2): qty 90, 90d supply, fill #0
  Filled 2023-05-07: qty 90, 90d supply, fill #1

## 2022-12-27 MED ORDER — METOPROLOL SUCCINATE ER 100 MG PO TB24
150.0000 mg | ORAL_TABLET | Freq: Every day | ORAL | 1 refills | Status: DC
  Filled 2022-12-27 – 2023-01-04 (×2): qty 135, 90d supply, fill #0
  Filled 2023-05-22: qty 135, 90d supply, fill #1

## 2022-12-27 NOTE — Telephone Encounter (Signed)
Rx's sent that are done by Dr. Drue Novel. Meds by Dr. Antoine Poche and Dr. Lonzo Cloud will have to be sent by their office.

## 2022-12-27 NOTE — Telephone Encounter (Signed)
Pt is requesting that all medications be transferred to pharmacy downstairs. Pt also dropped off paperwork to be filled out by PCP, paperwork is for work accommodations. Pt requests to be called at [phone] when paperwork is complete as well as a copy of the completed paperwork. Paperwork is placed in PCP's box.

## 2022-12-27 NOTE — Telephone Encounter (Signed)
Spoke w/ Pt and his wife, Pt is dropping off forms for his back, however he see's a specialist at [facility] for this. Informed that ortho will need to complete for him. Pt and his wife verbalized understanding. Paperwork tossed. Informed Pt that med refills have been sent downstairs- to reach out to endo and cardiology for refills on those meds.

## 2022-12-28 ENCOUNTER — Other Ambulatory Visit: Payer: Self-pay

## 2022-12-28 ENCOUNTER — Other Ambulatory Visit (HOSPITAL_BASED_OUTPATIENT_CLINIC_OR_DEPARTMENT_OTHER): Payer: Self-pay

## 2022-12-28 MED ORDER — LATANOPROST 0.005 % OP SOLN
1.0000 [drp] | Freq: Every day | OPHTHALMIC | 11 refills | Status: DC
  Filled 2022-12-28: qty 2.5, 30d supply, fill #0
  Filled 2023-03-28: qty 2.5, 50d supply, fill #0
  Filled 2023-09-19: qty 2.5, 25d supply, fill #2

## 2022-12-29 ENCOUNTER — Other Ambulatory Visit (HOSPITAL_BASED_OUTPATIENT_CLINIC_OR_DEPARTMENT_OTHER): Payer: Self-pay

## 2023-01-01 ENCOUNTER — Other Ambulatory Visit (HOSPITAL_BASED_OUTPATIENT_CLINIC_OR_DEPARTMENT_OTHER): Payer: Self-pay

## 2023-01-01 MED ORDER — DULOXETINE HCL 30 MG PO CPEP
30.0000 mg | ORAL_CAPSULE | Freq: Every day | ORAL | 1 refills | Status: DC
  Filled 2023-01-01: qty 30, 30d supply, fill #0
  Filled 2023-02-28: qty 30, 30d supply, fill #1

## 2023-01-03 ENCOUNTER — Other Ambulatory Visit: Payer: Self-pay | Admitting: Internal Medicine

## 2023-01-04 ENCOUNTER — Other Ambulatory Visit (HOSPITAL_BASED_OUTPATIENT_CLINIC_OR_DEPARTMENT_OTHER): Payer: Self-pay

## 2023-01-15 ENCOUNTER — Encounter: Payer: Self-pay | Admitting: Family Medicine

## 2023-01-15 ENCOUNTER — Other Ambulatory Visit (HOSPITAL_BASED_OUTPATIENT_CLINIC_OR_DEPARTMENT_OTHER): Payer: Self-pay

## 2023-01-15 ENCOUNTER — Telehealth: Payer: Self-pay

## 2023-01-15 ENCOUNTER — Ambulatory Visit: Payer: Medicare Other | Admitting: Family Medicine

## 2023-01-15 VITALS — BP 139/53 | HR 71 | Temp 98.6°F | Ht 65.0 in | Wt 124.8 lb

## 2023-01-15 DIAGNOSIS — J0140 Acute pansinusitis, unspecified: Secondary | ICD-10-CM

## 2023-01-15 MED ORDER — DOXYCYCLINE HYCLATE 100 MG PO TABS
100.0000 mg | ORAL_TABLET | Freq: Two times a day (BID) | ORAL | 0 refills | Status: AC
  Filled 2023-01-15: qty 20, 10d supply, fill #0

## 2023-01-15 NOTE — Progress Notes (Signed)
Acute Office Visit  Subjective:     Patient ID: Steven [name], male    DOB: 10-16-1945, 77 y.o.   MRN: [id number]  Chief Complaint  Patient presents with   Cough    Onset one week 01/08/2023  Blood in mucus  Pressure in face      Patient is in today for sinusitis/cough.   Discussed the use of AI scribe software for clinical note transcription with the patient, who gave verbal consent to proceed.  History of Present Illness   The patient presents with a week-long history of upper respiratory symptoms, initially presenting as a sore throat. Despite attempts at self-medication with over-the-counter remedies, the symptoms have persisted and evolved to include coughing, sinus pressure, and a runny nose, sometimes with blood. The patient denies ear pain, but reports experiencing chills. They have not experienced any gastrointestinal symptoms such as nausea, vomiting, or diarrhea. The patient has not been tested for COVID-19.  The patient has a history of recurrent sinus issues, with a referral to an Ear, Nose, and Throat (ENT) specialist pending for next month due to persistent runny nose despite the use of prescribed nasal sprays. This current episode of upper respiratory symptoms is the first of its kind for the patient, who cannot recall a previous instance of a similar infection.          All review of systems negative except what is listed in the HPI      Objective:    BP (!) 139/53 (BP Location: Left Arm, Patient Position: Sitting, Cuff Size: Normal)   Pulse 71   Temp 98.6 F (37 C) (Oral)   Ht 5\' 5"  (1.651 m)   Wt 124 lb 12.8 oz (56.6 kg)   SpO2 99%   BMI 20.77 kg/m    Physical Exam Vitals reviewed.  Constitutional:      Appearance: Normal appearance.  HENT:     Head: Normocephalic and atraumatic.     Right Ear: Tympanic membrane normal.     Left Ear: Tympanic membrane normal.     Nose: Rhinorrhea present.  Cardiovascular:     Rate and Rhythm: Normal  rate and regular rhythm.     Pulses: Normal pulses.     Heart sounds: Normal heart sounds.  Pulmonary:     Effort: Pulmonary effort is normal.     Breath sounds: Normal breath sounds.  Skin:    General: Skin is warm and dry.  Neurological:     Mental Status: He is alert and oriented to person, place, and time.  Psychiatric:        Mood and Affect: Mood normal.        Behavior: Behavior normal.        Thought Content: Thought content normal.        Judgment: Judgment normal.         No results found for any visits on 01/15/23.      Assessment & Plan:   Problem List Items Addressed This Visit   None Visit Diagnoses     Acute non-recurrent pansinusitis    -  Primary   Relevant Medications   doxycycline (VIBRA-TABS) 100 MG tablet     Adding antibiotics - doxycycline (take with food and water; add a probiotic 2 hours apart from antibiotic dose) Continue your regular nasal sprays Continue supportive measures including rest, hydration, humidifier use, steam showers, warm compresses to sinuses, warm liquids with lemon and honey, and over-the-counter cough, cold, and analgesics as  needed.  Keep upcoming ENT appointment.   Please contact office for follow-up if symptoms do not improve or worsen. Seek emergency care if symptoms become severe.  Meds ordered this encounter  Medications   doxycycline (VIBRA-TABS) 100 MG tablet    Sig: Take 1 tablet (100 mg total) by mouth 2 (two) times daily for 10 days.    Dispense:  20 tablet    Refill:  0    Order Specific Question:   Supervising Provider    Answer:   Danise Edge A [[id number]]    Return if symptoms worsen or fail to improve.  Clayborne Dana, NP

## 2023-01-15 NOTE — Telephone Encounter (Signed)
Initial Comment Caller states she's calling for Steven [name], states over the week his symptoms have gotten worse. He's coughing, hacking and sinus problems such as congestion, nose running. Fever, but doesn't know last recorded temp. States it could be a sinus infections. Additional Comment Would prefer a call back at the secondary number: [phone]. Its his wifes number. Translation No Nurse Assessment Nurse: Hassan Rowan, RN, Melissa Date/Time (Eastern Time): 01/14/2023 4:14:11 PM Confirm and document reason for call. If symptomatic, describe symptoms. ---caller states coughing, congestion, runny nose, states sx since last week, going into second week, states a little bit of fever but has not taken temp, states hot and sweaty, states lots of mucus when coughing and from nose too, denies vomiting or diarrhea, denies chest pain, denies difficulty breathing Does the patient have any new or worsening symptoms? ---Yes Will a triage be completed? ---Yes Related visit to physician within the last 2 weeks? ---No Does the PT have any chronic conditions? (i.e. diabetes, asthma, this includes High risk factors for pregnancy, etc.) ---Yes List chronic conditions. ---DM, HTN Is this a behavioral health or substance abuse call? ---No PLEASE NOTE: All timestamps contained within this report are represented as [place] Standard Time. CONFIDENTIALTY NOTICE: This fax transmission is intended only for the addressee. It contains information that is legally privileged, confidential or otherwise protected from use or disclosure. If you are not the intended recipient, you are strictly prohibited from reviewing, disclosing, copying using or disseminating any of this information or taking any action in reliance on or regarding this information. If you have received this fax in error, please notify us immediately by telephone so that we can arrange for its return to [place]. Phone: [phone], Toll-Free:  [phone], Fax: [phone] Page: 2 of 2 Call Id: [id number] Guidelines Guideline Title Affirmed Question Affirmed Notes Nurse Date/Time Lamount Cohen Time) Cough - Acute Productive [1] Continuous (nonstop) coughing interferes with work or school AND [2] no improvement using cough treatment per Care Advice Wood[place], RN, [facility] 01/14/2023 4:17:19 PM Disp. Time Lamount Cohen Time) Disposition Final User 01/14/2023 4:20:45 PM See PCP within 24 Hours Yes Camery, RN, Melissa Final Disposition 01/14/2023 4:20:45 PM See PCP within 24 Hours Yes Camery, RN, Efraim Kaufmann Caller Disagree/Comply Comply Caller Understands Yes PreDisposition Go to ED Care Advice Given Per Guideline SEE PCP WITHIN 24 HOURS: * IF OFFICE WILL BE OPEN: You need to be examined within the next 24 hours. Call your doctor (or NP/PA) when the office opens and make an appointment. * COUGH DROPS: Over-the-counter cough drops can help a lot, especially for mild coughs. They soothe an irritated throat and remove the tickle sensation in the back of the throat. Cough drops are easy to carry with you. * Drink warm fluids. Inhale warm mist. This can help relax the airway and also loosen up phlegm. CALL BACK IF: * Difficulty breathing occurs * You become worse CARE ADVICE given per Cough - Acute Productive (Adult) guideline. Comments User: Lauris Poag, RN Date/Time Lamount Cohen Time): 01/14/2023 4:17:07 PM pt audibly coughing User: Lauris Poag, RN Date/Time Lamount Cohen Time): 01/14/2023 4:18:40 PM states a little blood when sneezing User: Lauris Poag, RN Date/Time Lamount Cohen Time): 01/14/2023 4:19:37 PM has tried OTC cough mucinex and it is not helping Referrals REFERRED TO PCP OFFICE

## 2023-01-15 NOTE — Telephone Encounter (Signed)
Appt w/ Taylor today.  ?

## 2023-01-15 NOTE — Patient Instructions (Addendum)
Adding antibiotics - doxycycline (take with food and water; add a probiotic 2 hours apart from antibiotic dose) Continue your regular nasal sprays Continue supportive measures including rest, hydration, humidifier use, steam showers, warm compresses to sinuses, warm liquids with lemon and honey, and over-the-counter cough, cold, and analgesics as needed.  Keep upcoming ENT appointment.   Please contact office for follow-up if symptoms do not improve or worsen. Seek emergency care if symptoms become severe.

## 2023-01-26 ENCOUNTER — Encounter: Payer: Self-pay | Admitting: Internal Medicine

## 2023-01-30 LAB — LIPID PANEL
Chol/HDL Ratio: 2.4 ratio (ref 0.0–5.0)
LDL Chol Calc (NIH): 59 mg/dL (ref 0–99)

## 2023-01-31 ENCOUNTER — Other Ambulatory Visit (HOSPITAL_BASED_OUTPATIENT_CLINIC_OR_DEPARTMENT_OTHER): Payer: Self-pay

## 2023-01-31 ENCOUNTER — Encounter: Payer: Self-pay | Admitting: *Deleted

## 2023-01-31 LAB — LIPID PANEL
Cholesterol, Total: 131 mg/dL (ref 100–199)
HDL: 55 mg/dL (ref >39)
Triglycerides: 87 mg/dL (ref 0–149)
VLDL Cholesterol Cal: 17 mg/dL (ref 5–40)

## 2023-01-31 LAB — LIPOPROTEIN A (LPA): Lipoprotein (a): 249.6 nmol/L — ABNORMAL HIGH (ref <75.0)

## 2023-02-01 ENCOUNTER — Other Ambulatory Visit (HOSPITAL_BASED_OUTPATIENT_CLINIC_OR_DEPARTMENT_OTHER): Payer: Self-pay

## 2023-02-01 MED ORDER — IPRATROPIUM BROMIDE 0.06 % NA SOLN
1.0000 | Freq: Two times a day (BID) | NASAL | 1 refills | Status: DC
  Filled 2023-02-01: qty 15, 30d supply, fill #0
  Filled 2023-08-03: qty 15, 30d supply, fill #1

## 2023-02-01 MED FILL — Continuous Glucose System Sensor: 84 days supply | Qty: 6 | Fill #0 | Status: AC

## 2023-02-13 ENCOUNTER — Other Ambulatory Visit: Payer: Self-pay

## 2023-02-14 ENCOUNTER — Other Ambulatory Visit (HOSPITAL_BASED_OUTPATIENT_CLINIC_OR_DEPARTMENT_OTHER): Payer: Self-pay

## 2023-02-14 ENCOUNTER — Encounter: Payer: Self-pay | Admitting: Internal Medicine

## 2023-02-14 ENCOUNTER — Ambulatory Visit: Payer: Medicare Other | Admitting: Internal Medicine

## 2023-02-14 ENCOUNTER — Telehealth: Payer: Self-pay | Admitting: Internal Medicine

## 2023-02-14 VITALS — BP 138/60 | HR 75 | Temp 98.1°F | Resp 18 | Ht 65.0 in | Wt 129.4 lb

## 2023-02-14 DIAGNOSIS — J479 Bronchiectasis, uncomplicated: Secondary | ICD-10-CM

## 2023-02-14 DIAGNOSIS — L700 Acne vulgaris: Secondary | ICD-10-CM

## 2023-02-14 DIAGNOSIS — E1142 Type 2 diabetes mellitus with diabetic polyneuropathy: Secondary | ICD-10-CM | POA: Diagnosis not present

## 2023-02-14 MED ORDER — DOXYCYCLINE HYCLATE 100 MG PO TABS
100.0000 mg | ORAL_TABLET | Freq: Two times a day (BID) | ORAL | 0 refills | Status: DC
  Filled 2023-02-14: qty 15, 8d supply, fill #0

## 2023-02-14 MED ORDER — BUDESONIDE-FORMOTEROL FUMARATE 160-4.5 MCG/ACT IN AERO
2.0000 | INHALATION_SPRAY | Freq: Two times a day (BID) | RESPIRATORY_TRACT | 3 refills | Status: DC
  Filled 2023-02-14: qty 10.2, 30d supply, fill #0

## 2023-02-14 NOTE — Patient Instructions (Signed)
Start  Symbicort: 2 puffs twice daily  Take the antibiotic as prescribed (doxycycline)  Mucinex DM or Robitussin DM as needed  Call if not gradually better  Will arrange for CAT scan of your chest.  See you in October.  Sooner if needed.

## 2023-02-14 NOTE — Telephone Encounter (Signed)
Pt called stating that the pharmacy notified him that the medication was denied through his insurance and that he will not be to afford it. Please advise the pt of other options.

## 2023-02-14 NOTE — Progress Notes (Unsigned)
Subjective:    Patient ID: Steven [name], male    DOB: 1945-12-05, 77 y.o.   MRN: [id number]  DOS:  02/14/2023 Type of visit - description: Multiple concerns  2 weeks history of cough: Occasionally with some sputum.  Also admits to sneezing, has chronic nasal discharge. No fever or chills. No difficulty breathing wheezing?  Patient denies however wife is with him and she said that he wheezes at night.  Neuropathy: Admits to some numbness mostly at the right foot.  Lump at the chest increasing in size.  See physical exam.  Review of Systems See above   Past Medical History:  Diagnosis Date   Allergy    Arthritis    Right Hip   Atrial fibrillation ([name])    BCC (basal cell carcinoma of skin)    BPH (benign prostatic hyperplasia)    COPD (chronic obstructive pulmonary disease) ([name])    Coronary artery disease    Lexiscan Myoview 7/14:  Inf scar with minimal reversibility, inf HK, EF 45%;  Low risk.  Promus LAD stent placed in [place].  100% RCA   Diabetes 1.5, managed as type 1 ([name])    Diverticulosis    on CT   Dyslipidemia    GERD (gastroesophageal reflux disease)    has used Zantac in the past    Glaucoma    Hammer toes, bilateral    Hernia    Hyperlipidemia    Hypertension    Peripheral vascular disease ([name])    a. s/p R fem-pop BPG 01/2013 (Dr. 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    Past Surgical History:  Procedure Laterality Date   ABDOMINAL AORTAGRAM N/A 12/10/2012   Procedure: ABDOMINAL Ronny Flurry;  Surgeon: Nada Libman, MD;  Location: [facility] CATH LAB;  Service: Cardiovascular;  Laterality: N/A;   CARDIAC CATHETERIZATION     with stent placement   FEMORAL-POPLITEAL BYPASS GRAFT Right 02/07/2013   Procedure: BYPASS GRAFT FEMORAL-POPLITEAL ARTERY- RIGHT;  Surgeon: Pryor Ochoa, MD;  Location: [facility] OR;  Service: Vascular;  Laterality: Right;  Right femoral to below knee popliteal bypass using right non reversed  great saphenous vein   INGUINAL HERNIA REPAIR  2010   Right   INGUINAL HERNIA REPAIR  09/11/2018   INTRAOPERATIVE ARTERIOGRAM Right 02/07/2013   Procedure: Attempted INTRA OPERATIVE ARTERIOGRAM;  Surgeon: Pryor Ochoa, MD;  Location: [facility] OR;  Service: Vascular;  Laterality: Right;   IR RETROGRADE PYELOGRAM  12/10/2019   TRANSURETHRAL RESECTION OF PROSTATE  12/10/2019    Current Outpatient Medications  Medication Instructions   amLODipine (NORVASC) 10 mg, Oral, Daily   aspirin EC 81 mg, Daily   Continuous Glucose Sensor (FREESTYLE LIBRE 2 SENSOR) MISC INJECT 1 DEVICE INTO THE SKIN  EVERY 14 DAYS   DULoxetine (CYMBALTA) 30 mg, Oral, Daily   empagliflozin (JARDIANCE) 25 mg, Oral, Daily before breakfast   ezetimibe (ZETIA) 10 mg, Oral, Daily   fluticasone (FLONASE) 50 MCG/ACT nasal spray 2 sprays, Each Nare, Daily   insulin glargine (1 Unit Dial) (TOUJEO) 12 Units, Subcutaneous, Daily   ipratropium (ATROVENT) 0.06 % nasal spray 1 spray, Each Nare, 2 times daily, Can also use as needed prior to meals.   latanoprost (XALATAN) 0.005 % ophthalmic solution 1 drop, Both Eyes, Daily at bedtime   lidocaine (LIDODERM) 5 % 1 patch, Transdermal, Every 24 hours, Remove & Discard patch within 12 hours or as directed by MD  metoprolol succinate (TOPROL-XL) 150 mg, Oral, Daily, Take with or immediately following a meal.   repaglinide (PRANDIN) 0.5 mg, Oral, Daily before breakfast   rosuvastatin (CRESTOR) 20 mg, Oral, Daily   Rybelsus 14 mg, Oral, Daily   sucralfate (CARAFATE) 1 g, Oral, 3 times daily with meals & bedtime       Objective:   Physical Exam BP 138/60   Pulse 75   Temp 98.1 F (36.7 C) (Oral)   Resp 18   Ht 5\' 5"  (1.651 m)   Wt 129 lb 6 oz (58.7 kg)   SpO2 99%   BMI 21.53 kg/m  General:   Well developed, NAD, BMI noted. HEENT:  Normocephalic . Face symmetric, atraumatic. Nose: Congested Lungs:  Slightly decreased breath sounds, no wheezing, few rhonchi and prolonged  expiratory time with forced cough. Normal respiratory effort, no intercostal retractions, no accessory muscle use. Heart: RRR,  no murmur.  DM foot exam: Pinprick examination show decrease sensitivity distally, very mild.  Few calluses. Skin: Lump at the chest is a hard-dry "blackhead" without evidence of inflammation, approximately 5 mm in size. Neurologic:  alert & oriented X3.  Speech normal, gait appropriate for age and unassisted Psych--  Cognition and judgment appear intact.  Cooperative with normal attention span and concentration.  Behavior appropriate. No anxious or depressed appearing.      Assessment     ASSESSMENT (new pt to me 12/2019) DM, first visit with Endo 09/07/2020 HTN Hyperlipidemia CKD: --Creatinine fluctuates 1.70- 1.40 --CT November 2021: Normal kidneys and bladder CV: --CAD, Dr. Antoine Poche --PAD, fem-pop  2014 Sickle cell anemia COPD - CT chest  08-2014: Bronchiectasis, centrilobular emphysema - Former smoker BPH, TURP and bilateral retrograde pyelograms 12/10/2019, Dr Jeralyn Bennett Vasomotor rhinitis: Saw ENT 01/2022  PLAN DM: Paperwork for diabetic shoes completed after the exam showed neuropathy with some calluses.  For diabetes management saw endocrinology 12/01/2022. Blackhead: Observation, if so desired consult with dermatology for excision. COPD: Documented by CT chest 2016, emphysema bronchiectasis.  Problem has  been silent for the most part.  Current symptoms could be bronchiectasis exacerbation. Plan: Symbicort, doxycycline, CT chest.  Reassess on RTC.  Call if not gradually better. 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 RTC scheduled for October 2024.

## 2023-02-14 NOTE — Telephone Encounter (Signed)
PA initiated via Covermymeds; KEY: [id number]. Awaiting determination.

## 2023-02-15 ENCOUNTER — Telehealth: Payer: Self-pay

## 2023-02-15 ENCOUNTER — Other Ambulatory Visit (HOSPITAL_BASED_OUTPATIENT_CLINIC_OR_DEPARTMENT_OTHER): Payer: Self-pay

## 2023-02-15 MED ORDER — FLUTICASONE-SALMETEROL 100-50 MCG/ACT IN AEPB
1.0000 | INHALATION_SPRAY | Freq: Two times a day (BID) | RESPIRATORY_TRACT | 3 refills | Status: DC
  Filled 2023-02-15: qty 60, 30d supply, fill #0

## 2023-02-15 NOTE — Telephone Encounter (Signed)
Yes , new rx sent ; please send a note to the pharmacist, I showed him how to use Symbicort but they need to teach him how to use Advair which is a different delivery system.

## 2023-02-15 NOTE — Assessment & Plan Note (Signed)
DM: Paperwork for diabetic shoes completed after the exam showed neuropathy with some calluses.  For diabetes management saw endocrinology 12/01/2022. Blackhead: Observation, if so desired consult with dermatology for excision. COPD: Documented by CT chest 2016, emphysema bronchiectasis.  Problem has  been silent for the most part.  Current symptoms could be bronchiectasis exacerbation. Plan: Symbicort, doxycycline, CT chest.  Reassess on RTC.  Call if not gradually better. 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 RTC scheduled for October 2024.

## 2023-02-15 NOTE — Telephone Encounter (Addendum)
Would you be willing to change symbicort to brand Advair Diskus, fluticasone-salmeterol (generic Advair Diskus) or Wixela? PA is however still pending on generic symbicort.

## 2023-02-15 NOTE — Telephone Encounter (Signed)
Pt called in to check status of PA nd to provide an alternative from insurance in the case that this PA was denied:  Sluticasone

## 2023-02-15 NOTE — Telephone Encounter (Signed)
Received fax confirmation

## 2023-02-15 NOTE — Telephone Encounter (Signed)
PA is still pending.  

## 2023-02-15 NOTE — Addendum Note (Signed)
Addended by: Willow Ora E on: 02/15/2023 10:18 AM   Modules accepted: Orders

## 2023-02-15 NOTE — Telephone Encounter (Signed)
Will message pharmacy.

## 2023-02-15 NOTE — Telephone Encounter (Signed)
Form completed and faxed w/ OV note from 02/14/23 to [facility] at [phone]. Form sent for scanning.

## 2023-02-22 ENCOUNTER — Ambulatory Visit (HOSPITAL_BASED_OUTPATIENT_CLINIC_OR_DEPARTMENT_OTHER)
Admission: RE | Admit: 2023-02-22 | Discharge: 2023-02-22 | Disposition: A | Discharge status: Home / Self Care | Payer: Medicare Other | Source: Ambulatory Visit | Attending: Internal Medicine | Admitting: Internal Medicine

## 2023-02-22 DIAGNOSIS — J479 Bronchiectasis, uncomplicated: Secondary | ICD-10-CM | POA: Insufficient documentation

## 2023-02-26 ENCOUNTER — Other Ambulatory Visit (HOSPITAL_BASED_OUTPATIENT_CLINIC_OR_DEPARTMENT_OTHER): Payer: Self-pay

## 2023-02-26 MED ORDER — FLUOCINOLONE ACETONIDE SCALP 0.01 % EX OIL
1.0000 "application " | TOPICAL_OIL | Freq: Every day | CUTANEOUS | 11 refills | Status: DC
  Filled 2023-02-26: qty 118.28, 30d supply, fill #0

## 2023-02-28 ENCOUNTER — Other Ambulatory Visit (HOSPITAL_BASED_OUTPATIENT_CLINIC_OR_DEPARTMENT_OTHER): Payer: Self-pay

## 2023-02-28 ENCOUNTER — Telehealth: Payer: Self-pay | Admitting: Internal Medicine

## 2023-02-28 NOTE — Telephone Encounter (Signed)
I reviewed nephrology note from today, they recommend to do blood work and then decide if he needs to be on losartan.  Recommend to reach out to nephrology in few days

## 2023-02-28 NOTE — Telephone Encounter (Signed)
Pt states he saw his nephrologist today and they recommended he start losartan again and they would like pcp to fill this. He did not give any further information. Please advise if we are able to do this.    [facility] [address], [place] [address] Phone: [phone]  Fax: [phone]

## 2023-03-01 ENCOUNTER — Other Ambulatory Visit (HOSPITAL_BASED_OUTPATIENT_CLINIC_OR_DEPARTMENT_OTHER): Payer: Self-pay

## 2023-03-01 ENCOUNTER — Telehealth: Payer: Self-pay | Admitting: Internal Medicine

## 2023-03-01 DIAGNOSIS — R9389 Abnormal findings on diagnostic imaging of other specified body structures: Secondary | ICD-10-CM

## 2023-03-01 MED ORDER — LOSARTAN POTASSIUM 100 MG PO TABS
100.0000 mg | ORAL_TABLET | Freq: Every day | ORAL | 1 refills | Status: DC
  Filled 2023-03-01: qty 90, 90d supply, fill #0
  Filled 2023-05-22: qty 90, 90d supply, fill #1

## 2023-03-01 NOTE — Telephone Encounter (Signed)
Enter pulmonary referral.  Abnormal chest CT.  Needs further evaluation, Dr. Delton Coombes , Dr Tonia Brooms  === Spoke with the patient about abnormal CT, he is aware of pulmonary referral, we will let me know if he does not hear from them in few days. Clinically he feels better.

## 2023-03-01 NOTE — Telephone Encounter (Signed)
Spoke with pt, pt is aware of results and expressed understanding. Pt was advised to reach back out to Nephrology.

## 2023-03-01 NOTE — Telephone Encounter (Signed)
Referral has been placed. 

## 2023-03-02 LAB — HM DIABETES EYE EXAM

## 2023-03-06 ENCOUNTER — Other Ambulatory Visit (HOSPITAL_BASED_OUTPATIENT_CLINIC_OR_DEPARTMENT_OTHER): Payer: Self-pay

## 2023-03-06 MED ORDER — SHINGRIX 50 MCG/0.5ML IM SUSR
0.5000 mL | Freq: Once | INTRAMUSCULAR | 0 refills | Status: AC
Start: 2023-03-06 — End: 2023-03-07
  Filled 2023-03-06: qty 0.5, 1d supply, fill #0

## 2023-03-08 ENCOUNTER — Ambulatory Visit: Payer: Medicare Other | Admitting: Internal Medicine

## 2023-03-08 ENCOUNTER — Encounter: Payer: Self-pay | Admitting: Internal Medicine

## 2023-03-08 VITALS — BP 110/70 | HR 61 | Ht 65.0 in | Wt 126.0 lb

## 2023-03-08 DIAGNOSIS — N1832 Chronic kidney disease, stage 3b: Secondary | ICD-10-CM

## 2023-03-08 DIAGNOSIS — E1165 Type 2 diabetes mellitus with hyperglycemia: Secondary | ICD-10-CM

## 2023-03-08 DIAGNOSIS — E1122 Type 2 diabetes mellitus with diabetic chronic kidney disease: Secondary | ICD-10-CM | POA: Diagnosis not present

## 2023-03-08 DIAGNOSIS — E1142 Type 2 diabetes mellitus with diabetic polyneuropathy: Secondary | ICD-10-CM | POA: Diagnosis not present

## 2023-03-08 LAB — POCT GLYCOSYLATED HEMOGLOBIN (HGB A1C): Hemoglobin A1C: 6.9 % — AB (ref 4.0–5.6)

## 2023-03-08 NOTE — Patient Instructions (Addendum)
-   A1c 6.9% - STOP Metformin  - Continue Rapaglinide 0.5 mg, 1 tablet before Breakfast  - Continue Rybelsus 14 mg, 1 tablet daily before Breakfast  - Continue  Jardiance 25 mg, 1 tablet with Breakfast     HOW TO TREAT LOW BLOOD SUGARS (Blood sugar LESS THAN 70 MG/DL) Please follow the RULE OF 15 for the treatment of hypoglycemia treatment (when your (blood sugars are less than 70 mg/dL)   STEP 1: Take 15 grams of carbohydrates when your blood sugar is low, which includes:  3-4 GLUCOSE TABS  OR 3-4 OZ OF JUICE OR REGULAR SODA OR ONE TUBE OF GLUCOSE GEL    STEP 2: RECHECK blood sugar in 15 MINUTES STEP 3: If your blood sugar is still low at the 15 minute recheck --> then, go back to STEP 1 and treat AGAIN with another 15 grams of carbohydrates.

## 2023-03-08 NOTE — Progress Notes (Signed)
Name: Steven [name]  Age/ Sex: 77 y.o., male   MRN/ DOB: [id number], Aug 13, 1945     PCP: Wanda Plump, MD   Reason for Endocrinology Evaluation: Type 2 Diabetes Mellitus  Initial Endocrine Consultative Visit: 09/07/2020    PATIENT IDENTIFIER: [name] is a 77 y.o. male with a past medical history of T2DM, A.Fibrillation, COPD CAD and Dyslipidemia. The patient has followed with Endocrinology clinic since 09/07/2020 for consultative assistance with management of his diabetes.    DIABETIC HISTORY:  Steven [name] was diagnosed with DM in 1977, Has been on Metformin- no intolerance but had to be stopped while in hospital. Has been on insulin since 2014. His hemoglobin A1c has ranged from  in 7.0% in 2021, peaking at 8.4% in 2016.   He was seen by Steven [name] in 2018 but was lost to follow-up until his return to our clinic in 2022.  On his initial visit with me he had an A1c of 8.1% he was on Jardiance and Toujeo, his GFR was 46 at the time and we restarted 50% dose of metformin we also increase his Jardiance and continued insulin.   Rybelsus started 04/2021  Pt self discontinued 12 units of Toujeo 08/2022 due to hypoglycemia  Metformin stopped 11/2022 with GFR 37 Repaglinide started 11/2022 with an A1c 8.0%   SUBJECTIVE:   During the last visit (12/01/2022): A1c 8.0 %    Today (03/08/2023): Steven [name]  is here for diabetes management  He checks his blood sugars multiple  times daily, through CGM . The patient has  had hypoglycemic episodes since the last clinic visit.  He is not symptomatic with these episodes.   Pending back sx for sciatica  Denies nausea, vomiting  Has mild constipation      HOME DIABETES REGIMEN:  Repaglinide 0.5 mg, 1 tablet before breakfast Jardiance 25 mg, 1 tablet with Breakfast Rybelsus 14 mg daily        Statin: yes ACE-I/ARB: no     CONTINUOUS GLUCOSE MONITORING RECORD INTERPRETATION    Dates of Recording:  8/2-8/15/2024  Sensor description: freestyle libre  Results statistics:   CGM use % of time 62  Average and SD 148/27.1  Time in range 80%  % Time Above 180 18  % Time above 250 2  % Time Below target 0    Glycemic patterns summary: BG's trend down overnight and fluctuate throughout the day  Hyperglycemic episodes  postprandial   Hypoglycemic episodes occurred at variable times during the day  Overnight periods: optimal      DIABETIC COMPLICATIONS: Microvascular complications:  Neuropathy, CKD III Denies: retinopathy  Last eye exam: Completed 03/02/2023   Macrovascular complications:  CAD Denies:  PVD, CVA   HISTORY:  Past Medical History:  Past Medical History:  Diagnosis Date   Allergy    Arthritis    Right Hip   Atrial fibrillation ([name])    BCC (basal cell carcinoma of skin)    BPH (benign prostatic hyperplasia)    COPD (chronic obstructive pulmonary disease) ([name])    Coronary artery disease    Lexiscan Myoview 7/14:  Inf scar with minimal reversibility, inf HK, EF 45%;  Low risk.  Promus LAD stent placed in [place].  100% RCA   Diabetes 1.5, managed as type 1 ([name])    Diverticulosis    on CT   Dyslipidemia    GERD (gastroesophageal reflux disease)    has used Zantac in the past    Glaucoma  Hammer toes, bilateral    Hernia    Hyperlipidemia    Hypertension    Peripheral vascular disease ([name])    a. s/p R fem-pop BPG 01/2013 (Dr. 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   Past Surgical History:  Past Surgical History:  Procedure Laterality Date   ABDOMINAL AORTAGRAM N/A 12/10/2012   Procedure: ABDOMINAL Ronny Flurry;  Surgeon: Nada Libman, MD;  Location: [facility] CATH LAB;  Service: Cardiovascular;  Laterality: N/A;   CARDIAC CATHETERIZATION     with stent placement   FEMORAL-POPLITEAL BYPASS GRAFT Right 02/07/2013   Procedure: BYPASS GRAFT FEMORAL-POPLITEAL ARTERY- RIGHT;  Surgeon: Pryor Ochoa, MD;   Location: [facility] OR;  Service: Vascular;  Laterality: Right;  Right femoral to below knee popliteal bypass using right non reversed great saphenous vein   INGUINAL HERNIA REPAIR  2010   Right   INGUINAL HERNIA REPAIR  09/11/2018   INTRAOPERATIVE ARTERIOGRAM Right 02/07/2013   Procedure: Attempted INTRA OPERATIVE ARTERIOGRAM;  Surgeon: Pryor Ochoa, MD;  Location: [facility] OR;  Service: Vascular;  Laterality: Right;   IR RETROGRADE PYELOGRAM  12/10/2019   TRANSURETHRAL RESECTION OF PROSTATE  12/10/2019   Social History:  reports that he quit smoking about 12 years ago. His smoking use included cigarettes. He started smoking about 62 years ago. He has a 15 pack-year smoking history. He has never been exposed to tobacco smoke. He has never used smokeless tobacco. He reports that he does not currently use alcohol. He reports that he does not use drugs. Family History:  Family History  Problem Relation Age of Onset   Diabetes Mother    Hypertension Mother    Heart disease Mother    Hyperlipidemia Mother    Diabetes Father    Heart disease Father    Hyperlipidemia Father    Hypertension Father    Diabetes Sister    Heart disease Sister    Hyperlipidemia Sister    Hypertension Sister    Diabetes Brother    Heart disease Brother    Hyperlipidemia Brother    Hypertension Brother    Diabetes Son    Heart disease Son    Hyperlipidemia Son    Hypertension Son    Pancreatic cancer Son    Alcohol abuse Neg Hx    Cancer Neg Hx    Depression Neg Hx    Early death Neg Hx    Kidney disease Neg Hx    Stroke Neg Hx    Colon cancer Neg Hx    Esophageal cancer Neg Hx    Rectal cancer Neg Hx    Stomach cancer Neg Hx    Liver cancer Neg Hx      HOME MEDICATIONS: Allergies as of 03/08/2023   No Known Allergies      Medication List        Accurate as of March 08, 2023 12:33 PM. If you have any questions, ask your nurse or doctor.          STOP taking these medications    doxycycline  100 MG tablet Commonly known as: VIBRA-TABS Stopped by: Johnney Ou        TAKE these medications    amLODipine 10 MG tablet Commonly known as: NORVASC Take 1 tablet (10 mg total) by mouth daily.   aspirin EC 81 MG tablet Take 81 mg by mouth daily.   DULoxetine 30 MG capsule Commonly known as: Cymbalta Take  1 capsule (30 mg total) by mouth daily.   ezetimibe 10 MG tablet Commonly known as: ZETIA Take 1 tablet (10 mg total) by mouth daily.   Fluocinolone Acetonide Scalp 0.01 % Oil Commonly known as: Derma-Smoothe/FS Scalp Apply 1 application topically to the scalp daily.   fluticasone 50 MCG/ACT nasal spray Commonly known as: FLONASE Place 2 sprays into both nostrils daily.   fluticasone-salmeterol 100-50 MCG/ACT Aepb Commonly known as: ADVAIR Inhale 1 puff into the lungs 2 (two) times daily.   FreeStyle Libre 2 Sensor Misc INJECT 1 DEVICE INTO THE SKIN  EVERY 14 DAYS   insulin glargine (1 Unit Dial) 300 UNIT/ML Solostar Pen Commonly known as: TOUJEO Inject 12 Units into the skin daily.   ipratropium 0.06 % nasal spray Commonly known as: ATROVENT Place 1 spray into both nostrils 2 (two) times daily. Can also use as needed prior to meals.   Jardiance 25 MG Tabs tablet Generic drug: empagliflozin Take 1 tablet (25 mg total) by mouth daily before breakfast.   latanoprost 0.005 % ophthalmic solution Commonly known as: XALATAN Place 1 drop into both eyes at bedtime.   lidocaine 5 % Commonly known as: Lidoderm Place 1 patch onto the skin daily. Remove & Discard patch within 12 hours or as directed by MD   losartan 100 MG tablet Commonly known as: COZAAR Take 1 tablet (100 mg total) by mouth daily.   metoprolol succinate 100 MG 24 hr tablet Commonly known as: TOPROL-XL Take 1.5 tablets (150 mg total) by mouth daily. Take with or immediately following a meal.   repaglinide 0.5 MG tablet Commonly known as: PRANDIN Take 1 tablet (0.5 mg total) by  mouth daily before breakfast.   rosuvastatin 20 MG tablet Commonly known as: CRESTOR Take 1 tablet (20 mg total) by mouth daily.   Rybelsus 14 MG Tabs Generic drug: Semaglutide Take 1 tablet (14 mg total) by mouth daily.   sucralfate 1 g tablet Commonly known as: CARAFATE Take 1 tablet (1 g total) by mouth 4 (four) times daily -  with meals and at bedtime.         OBJECTIVE:   Vital Signs: BP 110/70 (BP Location: Left Arm, Patient Position: Sitting, Cuff Size: Small)   Pulse 61   Ht 5\' 5"  (1.651 m)   Wt 126 lb (57.2 kg)   SpO2 96%   BMI 20.97 kg/m   Wt Readings from Last 3 Encounters:  03/08/23 126 lb (57.2 kg)  02/14/23 129 lb 6 oz (58.7 kg)  01/15/23 124 lb 12.8 oz (56.6 kg)     Exam: General: Pt appears well and is in NAD  Lungs: Clear with good BS bilat with no rales, rhonchi, or wheezes  Heart: RRR  Extremities: No pretibial edema.   Neuro: MS is good with appropriate affect, pt is alert and Ox3   DM foot exam: 12/01/2022   The skin of the feet is intact without sores or ulcerations. The pedal pulses are 1+ The sensation is intact to a screening 5.07, 10 gram monofilament bilaterally       DATA REVIEWED:  Lab Results  Component Value Date   HGBA1C 6.9 (A) 03/08/2023   HGBA1C 8.0 (H) 11/28/2022   HGBA1C 7.6 (H) 07/27/2022     Latest Reference Range & Units 11/28/22 14:13  Sodium 135 - 145 mEq/L 139  Potassium 3.5 - 5.1 mEq/L 4.5  Chloride 96 - 112 mEq/L 103  CO2 19 - 32 mEq/L 28  Glucose 70 -  99 mg/dL 409 (H)  BUN 6 - 23 mg/dL 21  Creatinine 8.11 - 9.14 mg/dL 7.82 (H)  Calcium 8.4 - 10.5 mg/dL 9.5  GFR >95.62 mL/min 37.33 (L)    Old records , labs and images have been reviewed.    ASSESSMENT / PLAN / RECOMMENDATIONS:   1) Type 2 Diabetes Mellitus, Optimally Control, With Neuropathic, CKD III and macrovascular complications - Most recent A1c of 6.9 %. Goal A1c < 7.0 %.      -A1c has trended down from 8.0% to 6.9% -In reviewing his CGM  data, the patient has been noted with variable hypoglycemia, I have asked the patient to confirm these readings with a fingerstick as I suspect some of these are false -I have discontinued his metformin 11/2022 due to low GFR but the patient tells me he still takes metformin? -I have asked the patient to discontinue metformin -No other changes at this time  MEDICATIONS: Stop  Metformin 500 mg XR, with Breakfast  Continue  Repaglinide 0.5 mg, 1 tablet before Breakfast  Continue  Rybelsus 14 mg daily  Continue  Jardiance 25 mg, 1 tablet with Breakfast    EDUCATION / INSTRUCTIONS: BG monitoring instructions: Patient is instructed to check his blood sugars 3 times a day, before meals. Call [facility] if: BG persistently < 70  I reviewed the Rule of 15 for the treatment of hypoglycemia in detail with the patient. Literature supplied.   2) Diabetic complications:  Eye: Does not have known diabetic retinopathy.  Neuro/ Feet: Does have known diabetic peripheral neuropathy .  Renal: Patient does have known baseline CKD. He is under the care of nephrology    F/U in 4 months   Signed electronically by: Lyndle Herrlich, MD  [facility] Endocrinology  [facility] [address] [place], [place] [address] Phone: [phone] FAX: [phone]   CC: Wanda Plump, MD [address] [place] [address] Phone: [phone]  Fax: [phone]  Return to Endocrinology clinic as below: Future Appointments  Date Time Provider Department Center  05/16/2023 11:00 AM Wanda Plump, MD [facility]

## 2023-03-09 ENCOUNTER — Encounter: Payer: Self-pay | Admitting: Internal Medicine

## 2023-03-09 NOTE — Telephone Encounter (Signed)
Received prescription for diabetic shoes from [facility]. Rx signed and faxed back to [phone]. Form sent for scanning

## 2023-03-12 ENCOUNTER — Telehealth: Payer: Self-pay | Admitting: Internal Medicine

## 2023-03-12 NOTE — Telephone Encounter (Signed)
Pt droppedoff paperwork to be completed by PCP. Pt requests to be called when ready. Papers placed in PCP Tray in front office.

## 2023-03-13 NOTE — Telephone Encounter (Signed)
Received handicap placard form, I don't see that you have ever completed one for him. Pt does have diabetic neuropathy, okay to complete?

## 2023-03-13 NOTE — Telephone Encounter (Signed)
77 year old patient with multiple medical problems, okay to complete the form

## 2023-03-13 NOTE — Telephone Encounter (Signed)
Spoke w/ Pt- informed that form is ready for pick up at front desk. Copy sent for scanning.  ?

## 2023-03-15 ENCOUNTER — Encounter: Payer: Self-pay | Admitting: Internal Medicine

## 2023-03-21 ENCOUNTER — Other Ambulatory Visit: Payer: Self-pay | Admitting: Orthopedic Surgery

## 2023-03-28 ENCOUNTER — Other Ambulatory Visit (HOSPITAL_BASED_OUTPATIENT_CLINIC_OR_DEPARTMENT_OTHER): Payer: Self-pay

## 2023-03-28 ENCOUNTER — Telehealth: Payer: Self-pay

## 2023-03-28 ENCOUNTER — Telehealth: Payer: Self-pay | Admitting: Internal Medicine

## 2023-03-28 MED ORDER — DULOXETINE HCL 30 MG PO CPEP
30.0000 mg | ORAL_CAPSULE | Freq: Every day | ORAL | 1 refills | Status: DC
  Filled 2023-03-28: qty 30, 30d supply, fill #0
  Filled 2023-05-01 (×3): qty 30, 30d supply, fill #1

## 2023-03-28 NOTE — Telephone Encounter (Signed)
Noted  

## 2023-03-28 NOTE — Telephone Encounter (Signed)
Pt called stating he wanted to let Dr. Drue Novel know about his surgery he is having on 9.19.24. Info is present in Epic regarding this surgery.

## 2023-03-28 NOTE — Telephone Encounter (Signed)
Patient states that he will have Lumbar surgery on the 19th and wants to know if there are any medication adjustment he needs to make.

## 2023-03-29 NOTE — Telephone Encounter (Signed)
Patient aware and I have also send information to mychart as requested

## 2023-04-04 ENCOUNTER — Telehealth: Payer: Self-pay | Admitting: *Deleted

## 2023-04-04 NOTE — Telephone Encounter (Signed)
Left message to call back to set up tele pre op appt. Ok per pre op APP to schedule on 04/09/23 as we do not manage ASA.

## 2023-04-04 NOTE — Telephone Encounter (Signed)
   Pre-operative Risk Assessment    Patient Name: DEYONTA BUNNER  DOB: 1946-05-06 MRN: [id number]   DATE OF LAST VISIT:  10/30/22 DR. HOCHREIN DATE OF NEXT VISIT: NONE  Request for Surgical Clearance    Procedure:   RIGHT-SIDED LUMBAR 4-5 TRANSFORAMINAL LUMBAR INTERBODY FUSION AND DECOMPRESSION WITH INSTRUMENTATION AND ALLOGRAFT  Date of Surgery:  Clearance 04/12/23 URGENT                                Surgeon:  DR. MARK DUMONSKI Surgeon's Group or Practice Name:  [facility] Phone number:  [phone] ATTN: Tobey Bride Fax number:  [phone]   Type of Clearance Requested:   - Medical ; ASA    Type of Anesthesia:  General    Additional requests/questions:    Elpidio Anis   04/04/2023, 11:40 AM

## 2023-04-04 NOTE — Telephone Encounter (Signed)
   Name: Steven [name]  DOB: 1945-11-20  MRN: [id number]  Primary Cardiologist: Rollene Rotunda, MD   Preoperative team, please contact this patient and set up a phone call appointment for further preoperative risk assessment. Please obtain consent and complete medication review. Thank you for your help.  I confirm that guidance regarding antiplatelet and oral anticoagulation therapy has been completed and, if necessary, noted below.  Patient's aspirin is not prescribed by cardiology.  Recommendations for holding aspirin will need to come from prescribing provider.   Ronney Asters, NP 04/04/2023, 11:57 AM [facility]

## 2023-04-06 ENCOUNTER — Telehealth: Payer: Self-pay | Admitting: *Deleted

## 2023-04-06 NOTE — Telephone Encounter (Signed)
Patient is returning call. Please return call to [phone]. He says that is his wife's number.

## 2023-04-06 NOTE — Progress Notes (Addendum)
Surgical Instructions   Your procedure is scheduled on April 12, 2023. Report to [facility] Main Entrance "A" at 05:30 A.M., then check in with the Admitting office. Any questions or running late day of surgery: call [phone]  Questions prior to your surgery date: call [phone], Monday-Friday, 8am-4pm. If you experience any cold or flu symptoms such as cough, fever, chills, shortness of breath, etc. between now and your scheduled surgery, please notify us at the above number.     Remember:  Do not eat after midnight the night before your surgery  You may drink clear liquids until 04:30 the morning of your surgery.   Clear liquids allowed are: Water, Non-Citrus Juices (without pulp), Carbonated Beverages, Clear Tea, Black Coffee Only (NO MILK, CREAM OR POWDERED CREAMER of any kind), and Gatorade.  Patient Instructions  The night before surgery:  No food after midnight. ONLY clear liquids after midnight   The day of surgery (if you have diabetes): Drink ONE (1) 12 oz G2 given to you in your pre admission testing appointment by 04:30 the morning of surgery. Drink in one sitting. Do not sip.  This drink was given to you during your hospital  pre-op appointment visit.  Nothing else to drink after completing the  12 oz bottle of G2.         If you have questions, please contact your surgeon's office.   Take these medicines the morning of surgery with A SIP OF WATER   amLODipine (NORVASC)  DULoxetine (CYMBALTA)  ezetimibe (ZETIA)  fluticasone (FLONASE)  fluticasone-salmeterol (ADVAIR)  latanoprost (XALATAN)  metoprolol succinate (TOPROL-XL)  Oxymetazoline HCl (NASAL RELIEF NA)  rosuvastatin (CRESTOR)    May take these medicines IF NEEDED:  ipratropium (ATROVENT)    Follow your surgeon's instructions on when to stop Aspirin.  If no instructions were given by your surgeon then you will need to call the office to get those instructions.    One week prior to  surgery, STOP taking any Aleve, Naproxen, Ibuprofen, Motrin, Advil, Goody's, BC's, all herbal medications, fish oil, and non-prescription vitamins.   WHAT DO I DO ABOUT MY DIABETES MEDICATION?   Do not take empagliflozin (JARDIANCE) for 72 hours prior to surgery.  Last dose 04/08/23 Stop taking Semaglutide (RYBELSUS) for 24 hours prior to surgery.  Last dose on September 18 BEFORE 05:30 am.  The day of surgery, do not take Repaglinide (PRANDIN)  If your CBG is greater than 220 mg/dL, you may take  of your sliding scale (correction) dose of insulin.   HOW TO MANAGE YOUR DIABETES BEFORE AND AFTER SURGERY  Why is it important to control my blood sugar before and after surgery? Improving blood sugar levels before and after surgery helps healing and can limit problems. A way of improving blood sugar control is eating a healthy diet by:  Eating less sugar and carbohydrates  Increasing activity/exercise  Talking with your doctor about reaching your blood sugar goals High blood sugars (greater than 180 mg/dL) can raise your risk of infections and slow your recovery, so you will need to focus on controlling your diabetes during the weeks before surgery. Make sure that the doctor who takes care of your diabetes knows about your planned surgery including the date and location.  How do I manage my blood sugar before surgery? Check your blood sugar at least 4 times a day, starting 2 days before surgery, to make sure that the level is not too high or low.  Check your blood  sugar the morning of your surgery when you wake up and every 2 hours until you get to the Short Stay unit.  If your blood sugar is less than 70 mg/dL, you will need to treat for low blood sugar: Do not take insulin. Treat a low blood sugar (less than 70 mg/dL) with  cup of clear juice (cranberry or apple), 4 glucose tablets, OR glucose gel. Recheck blood sugar in 15 minutes after treatment (to make sure it is greater than 70  mg/dL). If your blood sugar is not greater than 70 mg/dL on recheck, call [phone] for further instructions. Report your blood sugar to the short stay nurse when you get to Short Stay.  If you are admitted to the hospital after surgery: Your blood sugar will be checked by the staff and you will probably be given insulin after surgery (instead of oral diabetes medicines) to make sure you have good blood sugar levels. The goal for blood sugar control after surgery is 80-180 mg/dL.                    Do NOT Smoke (Tobacco/Vaping) for 24 hours prior to your procedure.  If you use a CPAP at night, you may bring your mask/headgear for your overnight stay.   You will be asked to remove any contacts, glasses, piercing's, hearing aid's, dentures/partials prior to surgery. Please bring cases for these items if needed.    Patients discharged the day of surgery will not be allowed to drive home, and someone needs to stay with them for 24 hours.  SURGICAL WAITING ROOM VISITATION Patients may have no more than 2 support people in the waiting area - these visitors may rotate.   Pre-op nurse will coordinate an appropriate time for 1 ADULT support person, who may not rotate, to accompany patient in pre-op.  Children under the age of 10 must have an adult with them who is not the patient and must remain in the main waiting area with an adult.  If the patient needs to stay at the hospital during part of their recovery, the visitor guidelines for inpatient rooms apply.  Please refer to the [facility] website for the visitor guidelines for any additional information.   If you received a COVID test during your pre-op visit  it is requested that you wear a mask when out in public, stay away from anyone that may not be feeling well and notify your surgeon if you develop symptoms. If you have been in contact with anyone that has tested positive in the last 10 days please notify you surgeon.       Pre-operative 5 CHG Bathing Instructions   You can play a key role in reducing the risk of infection after surgery. Your skin needs to be as free of germs as possible. You can reduce the number of germs on your skin by washing with CHG (chlorhexidine gluconate) soap before surgery. CHG is an antiseptic soap that kills germs and continues to kill germs even after washing.   DO NOT use if you have an allergy to chlorhexidine/CHG or antibacterial soaps. If your skin becomes reddened or irritated, stop using the CHG and notify one of our RNs at [phone].   Please shower with the CHG soap starting 4 days before surgery using the following schedule:     Please keep in mind the following:  DO NOT shave, including legs and underarms, starting the day of your first shower.   You  may shave your face at any point before/day of surgery.  Place clean sheets on your bed the day you start using CHG soap. Use a clean washcloth (not used since being washed) for each shower. DO NOT sleep with pets once you start using the CHG.   CHG Shower Instructions:  If you choose to wash your hair and private area, wash first with your normal shampoo/soap.  After you use shampoo/soap, rinse your hair and body thoroughly to remove shampoo/soap residue.  Turn the water OFF and apply about 3 tablespoons (45 ml) of CHG soap to a CLEAN washcloth.  Apply CHG soap ONLY FROM YOUR NECK DOWN TO YOUR TOES (washing for 3-5 minutes)  DO NOT use CHG soap on face, private areas, open wounds, or sores.  Pay special attention to the area where your surgery is being performed.  If you are having back surgery, having someone wash your back for you may be helpful. Wait 2 minutes after CHG soap is applied, then you may rinse off the CHG soap.  Pat dry with a clean towel  Put on clean clothes/pajamas   If you choose to wear lotion, please use ONLY the CHG-compatible lotions on the back of this paper.   Additional  instructions for the day of surgery: DO NOT APPLY any lotions, deodorants, cologne, or perfumes.   Do not bring valuables to the hospital. [facility] is not responsible for any belongings/valuables. Do not wear nail polish, gel polish, artificial nails, or any other type of covering on natural nails (fingers and toes) Do not wear jewelry or makeup Put on clean/comfortable clothes.  Please brush your teeth.  Ask your nurse before applying any prescription medications to the skin.     CHG Compatible Lotions   Aveeno Moisturizing lotion  Cetaphil Moisturizing Cream  Cetaphil Moisturizing Lotion  Clairol Herbal Essence Moisturizing Lotion, Dry Skin  Clairol Herbal Essence Moisturizing Lotion, Extra Dry Skin  Clairol Herbal Essence Moisturizing Lotion, Normal Skin  Curel Age Defying Therapeutic Moisturizing Lotion with Alpha Hydroxy  Curel Extreme Care Body Lotion  Curel Soothing Hands Moisturizing Hand Lotion  Curel Therapeutic Moisturizing Cream, Fragrance-Free  Curel Therapeutic Moisturizing Lotion, Fragrance-Free  Curel Therapeutic Moisturizing Lotion, Original Formula  Eucerin Daily Replenishing Lotion  Eucerin Dry Skin Therapy Plus Alpha Hydroxy Crme  Eucerin Dry Skin Therapy Plus Alpha Hydroxy Lotion  Eucerin Original Crme  Eucerin Original Lotion  Eucerin Plus Crme Eucerin Plus Lotion  Eucerin TriLipid Replenishing Lotion  Keri Anti-Bacterial Hand Lotion  Keri Deep Conditioning Original Lotion Dry Skin Formula Softly Scented  Keri Deep Conditioning Original Lotion, Fragrance Free Sensitive Skin Formula  Keri Lotion Fast Absorbing Fragrance Free Sensitive Skin Formula  Keri Lotion Fast Absorbing Softly Scented Dry Skin Formula  Keri Original Lotion  Keri Skin Renewal Lotion Keri Silky Smooth Lotion  Keri Silky Smooth Sensitive Skin Lotion  Nivea Body Creamy Conditioning Oil  Nivea Body Extra Enriched Lotion  Nivea Body Original Lotion  Nivea Body Sheer Moisturizing  Lotion Nivea Crme  Nivea Skin Firming Lotion  NutraDerm 30 Skin Lotion  NutraDerm Skin Lotion  NutraDerm Therapeutic Skin Cream  NutraDerm Therapeutic Skin Lotion  ProShield Protective Hand Cream  Provon moisturizing lotion  Please read over the following fact sheets that you were given.

## 2023-04-06 NOTE — Telephone Encounter (Signed)
S/w the pt and his wife. Pt has been scheduled for tele pre op appt 04/10/23 @ 3:20, ok per Joni Reining, DNP to add on due to procedure date.  Med rec and consent are done.     Patient Consent for Virtual Visit        Steven [name] has provided verbal consent on 04/06/2023 for a virtual visit (video or telephone).   CONSENT FOR VIRTUAL VISIT FOR:  Steven [name]  By participating in this virtual visit I agree to the following:  I hereby voluntarily request, consent and authorize [facility] and its employed or contracted physicians, physician assistants, nurse practitioners or other licensed health care professionals (the Practitioner), to provide me with telemedicine health care services (the "Services") as deemed necessary by the treating Practitioner. I acknowledge and consent to receive the Services by the Practitioner via telemedicine. I understand that the telemedicine visit will involve communicating with the Practitioner through live audiovisual communication technology and the disclosure of certain medical information by electronic transmission. I acknowledge that I have been given the opportunity to request an in-person assessment or other available alternative prior to the telemedicine visit and am voluntarily participating in the telemedicine visit.  I understand that I have the right to withhold or withdraw my consent to the use of telemedicine in the course of my care at any time, without affecting my right to future care or treatment, and that the Practitioner or I may terminate the telemedicine visit at any time. I understand that I have the right to inspect all information obtained and/or recorded in the course of the telemedicine visit and may receive copies of available information for a reasonable fee.  I understand that some of the potential risks of receiving the Services via telemedicine include:  Delay or interruption in medical evaluation due to  technological equipment failure or disruption; Information transmitted may not be sufficient (e.g. poor resolution of images) to allow for appropriate medical decision making by the Practitioner; and/or  In rare instances, security protocols could fail, causing a breach of personal health information.  Furthermore, I acknowledge that it is my responsibility to provide information about my medical history, conditions and care that is complete and accurate to the best of my ability. I acknowledge that Practitioner's advice, recommendations, and/or decision may be based on factors not within their control, such as incomplete or inaccurate data provided by me or distortions of diagnostic images or specimens that may result from electronic transmissions. I understand that the practice of medicine is not an exact science and that Practitioner makes no warranties or guarantees regarding treatment outcomes. I acknowledge that a copy of this consent can be made available to me via my patient portal [facility] MyChart), or I can request a printed copy by calling the office of [facility].    I understand that my insurance will be billed for this visit.   I have read or had this consent read to me. I understand the contents of this consent, which adequately explains the benefits and risks of the Services being provided via telemedicine.  I have been provided ample opportunity to ask questions regarding this consent and the Services and have had my questions answered to my satisfaction. I give my informed consent for the services to be provided through the use of telemedicine in my medical care

## 2023-04-06 NOTE — Telephone Encounter (Signed)
2nd attempt to reach pt to set up tele visit for preop clearance. I will send FYI notes to surgeon office that the pt needs to call back to set up tele visit.

## 2023-04-06 NOTE — Telephone Encounter (Signed)
S/w the pt and his wife. Pt has been scheduled for tele pre op appt 04/10/23 @ 3:20, ok per Joni Reining, DNP to add on due to procedure date.  Med rec and consent are done.

## 2023-04-09 ENCOUNTER — Encounter (HOSPITAL_COMMUNITY): Payer: Self-pay

## 2023-04-09 ENCOUNTER — Encounter (HOSPITAL_COMMUNITY)
Admission: RE | Admit: 2023-04-09 | Discharge: 2023-04-09 | Disposition: A | Discharge status: Home / Self Care | Payer: Medicare Other | Source: Ambulatory Visit | Attending: Orthopedic Surgery | Admitting: Orthopedic Surgery

## 2023-04-09 ENCOUNTER — Other Ambulatory Visit: Payer: Self-pay

## 2023-04-09 VITALS — BP 151/54 | HR 65 | Temp 98.5°F | Resp 18 | Ht 65.0 in | Wt 127.3 lb

## 2023-04-09 DIAGNOSIS — Z794 Long term (current) use of insulin: Secondary | ICD-10-CM | POA: Diagnosis not present

## 2023-04-09 DIAGNOSIS — Z01812 Encounter for preprocedural laboratory examination: Secondary | ICD-10-CM | POA: Diagnosis present

## 2023-04-09 DIAGNOSIS — E1165 Type 2 diabetes mellitus with hyperglycemia: Secondary | ICD-10-CM | POA: Diagnosis not present

## 2023-04-09 DIAGNOSIS — Z01818 Encounter for other preprocedural examination: Secondary | ICD-10-CM

## 2023-04-09 LAB — BASIC METABOLIC PANEL
Anion gap: 8 (ref 5–15)
BUN: 14 mg/dL (ref 8–23)
CO2: 24 mmol/L (ref 22–32)
Calcium: 9 mg/dL (ref 8.9–10.3)
Chloride: 106 mmol/L (ref 98–111)
Creatinine, Ser: 1.66 mg/dL — ABNORMAL HIGH (ref 0.61–1.24)
GFR, Estimated: 43 mL/min — ABNORMAL LOW (ref >60)
Glucose, Bld: 179 mg/dL — ABNORMAL HIGH (ref 70–99)
Potassium: 3.5 mmol/L (ref 3.5–5.1)
Sodium: 138 mmol/L (ref 135–145)

## 2023-04-09 LAB — TYPE AND SCREEN
ABO/RH(D): B POS
Antibody Screen: NEGATIVE

## 2023-04-09 LAB — CBC
HCT: 44.9 % (ref 39.0–52.0)
Hemoglobin: 14.5 g/dL (ref 13.0–17.0)
MCH: 27.2 pg (ref 26.0–34.0)
MCHC: 32.3 g/dL (ref 30.0–36.0)
MCV: 84.1 fL (ref 80.0–100.0)
Platelets: 133 10*3/uL — ABNORMAL LOW (ref 150–400)
RBC: 5.34 MIL/uL (ref 4.22–5.81)
RDW: 15.2 % (ref 11.5–15.5)
WBC: 5.9 10*3/uL (ref 4.0–10.5)
nRBC: 0 % (ref 0.0–0.2)

## 2023-04-09 LAB — SURGICAL PCR SCREEN

## 2023-04-09 LAB — GLUCOSE, CAPILLARY: Glucose-Capillary: 220 mg/dL — ABNORMAL HIGH (ref 70–99)

## 2023-04-09 NOTE — Progress Notes (Signed)
PCP - Dr. Willow Ora Cardiologist - Dr. Rollene Rotunda   PPM/ICD - denies   Chest x-ray - 07/29/14 EKG - 10/30/22 Stress Test - 01/30/13 ECHO - 08/03/20 Cardiac Cath - 12/05/10  Sleep Study - denies   Fasting Blood Sugar - 80-100 Checks Blood Sugar continuously (sensor on Right arm today)  Last dose of GLP1 agonist-  n/a   Blood Thinner Instructions: n/a Aspirin Instructions: f/u with surgeon  ERAS Protcol - yes PRE-SURGERY G2- given at PAT  COVID TEST- n/a   Anesthesia review: yes, cardiac hx (cardiac clearance appt 9/17)  Patient denies shortness of breath, fever, cough and chest pain at PAT appointment   All instructions explained to the patient, with a verbal understanding of the material. Patient agrees to go over the instructions while at home for a better understanding. The opportunity to ask questions was provided.

## 2023-04-10 ENCOUNTER — Ambulatory Visit: Payer: Medicare Other | Attending: Physician Assistant

## 2023-04-10 DIAGNOSIS — Z01810 Encounter for preprocedural cardiovascular examination: Secondary | ICD-10-CM

## 2023-04-10 NOTE — Anesthesia Preprocedure Evaluation (Signed)
Anesthesia Evaluation  Patient identified by MRN, date of birth, ID band Patient awake    Reviewed: Allergy & Precautions, H&P , NPO status , Patient's Chart, lab work & pertinent test results  Airway Mallampati: II  TM Distance: >3 FB Neck ROM: Full    Dental no notable dental hx.    Pulmonary COPD, former smoker   Pulmonary exam normal breath sounds clear to auscultation       Cardiovascular hypertension, + CAD, + Cardiac Stents and + Peripheral Vascular Disease  Normal cardiovascular exam Rhythm:Regular Rate:Normal  1. Left ventricular ejection fraction, by estimation, is 55 to 60%. Left  ventricular ejection fraction by 3D volume is 54 %. The left ventricle has  normal function. The left ventricle has no regional wall motion  abnormalities. There is moderate  concentric left ventricular hypertrophy. Left ventricular diastolic  parameters are indeterminate.   2. Right ventricular systolic function is normal. The right ventricular  size is normal. There is normal pulmonary artery systolic pressure. The  estimated right ventricular systolic pressure is 26.6 mmHg.   3. The mitral valve is grossly normal. Mild mitral valve regurgitation.  No evidence of mitral stenosis.   4. The aortic valve is tricuspid. Aortic valve regurgitation is trivial.  No aortic stenosis is present.   5. The inferior vena cava is normal in size with greater than 50%  respiratory variability, suggesting right atrial pressure of 3 mmHg.   FINDINGS     Neuro/Psych negative neurological ROS  negative psych ROS   GI/Hepatic negative GI ROS, Neg liver ROS,,,  Endo/Other  diabetes, Type 2    Renal/GU negative Renal ROS  negative genitourinary   Musculoskeletal negative musculoskeletal ROS (+)    Abdominal   Peds negative pediatric ROS (+)  Hematology negative hematology ROS (+)   Anesthesia Other Findings    Reproductive/Obstetrics negative OB ROS                             Anesthesia Physical Anesthesia Plan  ASA: 3  Anesthesia Plan: General   Post-op Pain Management: Ketamine IV* and Ofirmev IV (intra-op)*   Induction: Intravenous  PONV Risk Score and Plan: 2 and Ondansetron, Dexamethasone and Treatment may vary due to age or medical condition  Airway Management Planned: Oral ETT  Additional Equipment:   Intra-op Plan:   Post-operative Plan: Extubation in OR  Informed Consent: I have reviewed the patients History and Physical, chart, labs and discussed the procedure including the risks, benefits and alternatives for the proposed anesthesia with the patient or authorized representative who has indicated his/her understanding and acceptance.     Dental advisory given  Plan Discussed with: CRNA and Surgeon  Anesthesia Plan Comments: (PAT note written 04/10/2023 by Shonna Chock, PA-C.  )       Anesthesia Quick Evaluation

## 2023-04-10 NOTE — Progress Notes (Signed)
PCR invalid at PAT appt. PCR will need to be done again on day of surgery. New order placed

## 2023-04-10 NOTE — Progress Notes (Signed)
Virtual Visit via Telephone Note   Because of Steven [name]'s co-morbid illnesses, he is at least at moderate risk for complications without adequate follow up.  This format is felt to be most appropriate for this patient at this time.  The patient did not have access to video technology/had technical difficulties with video requiring transitioning to audio format only (telephone).  All issues noted in this document were discussed and addressed.  No physical exam could be performed with this format.  Please refer to the patient's chart for his consent to telehealth for [facility].  Evaluation Performed:  Preoperative cardiovascular risk assessment _____________   Date:  04/10/2023   Patient ID:  Steven [name], Steven [name] May 15, 1946, MRN [name] Patient Location:  Home Provider location:   Office  Primary Care Provider:  Wanda Plump, MD Primary Cardiologist:  Rollene Rotunda, MD  Chief Complaint / Patient Profile   77 y.o. y/o male with a h/o CAD and stent in the RCA placed back in 2014 in [place]  who is pending RIGHT-SIDED LUMBAR 4-5 TRANSFORAMINAL LUMBAR INTERBODY FUSION AND DECOMPRESSION WITH INSTRUMENTATION AND ALLOGRAFT  and presents today for telephonic preoperative cardiovascular risk assessment.  History of Present Illness    Steven [name] is a 77 y.o. male who presents via audio/video conferencing for a telehealth visit today.  Pt was last seen in cardiology clinic on 10/30/22 by Dr. Antoine Poche.  At that time Steven [name] was doing well .  The patient is now pending procedure as outlined above. Since his last visit, he has not any chest pains or SOB or palpitations. He does not meet 4 mets on the DASI but surgery is urgent. The last 6 months he has been severely limited by his legs and back.  He has been taking aspirin everyday and took it this morning. Unfortuntley was not addressed prior to our conversation.  Holding Jardiance per endocrinology. I encouraged him to  stop ASA today and reach out to his back surgeon.  Past Medical History    Past Medical History:  Diagnosis Date   Allergy    Arthritis    Right Hip   Atrial fibrillation ([name])    BCC (basal cell carcinoma of skin)    BPH (benign prostatic hyperplasia)    COPD (chronic obstructive pulmonary disease) ([name])    Coronary artery disease    Lexiscan Myoview 7/14:  Inf scar with minimal reversibility, inf HK, EF 45%;  Low risk.  Promus LAD stent placed in [place].  100% RCA   Diabetes 1.5, managed as type 1 ([name])    Diverticulosis    on CT   Dyslipidemia    GERD (gastroesophageal reflux disease)    has used Zantac in the past    Glaucoma    Hammer toes, bilateral    Hernia    Hyperlipidemia    Hypertension    Peripheral vascular disease ([name])    a. s/p R fem-pop BPG 01/2013 (Dr. 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   Past Surgical History:  Procedure Laterality Date   ABDOMINAL AORTAGRAM N/A 12/10/2012   Procedure: ABDOMINAL Ronny Flurry;  Surgeon: Nada Libman, MD;  Location: [facility] CATH LAB;  Service: Cardiovascular;  Laterality: N/A;   CARDIAC CATHETERIZATION  12/05/2010   with stent placement   FEMORAL-POPLITEAL BYPASS GRAFT Right 02/07/2013   Procedure: BYPASS GRAFT FEMORAL-POPLITEAL ARTERY- RIGHT;  Surgeon: Pryor Ochoa, MD;  Location: [facility] OR;  Service: Vascular;  Laterality: Right;  Right femoral to below knee popliteal bypass using right non reversed great saphenous vein   INGUINAL HERNIA REPAIR  2010   Right   INGUINAL HERNIA REPAIR  09/11/2018   INTRAOPERATIVE ARTERIOGRAM Right 02/07/2013   Procedure: Attempted INTRA OPERATIVE ARTERIOGRAM;  Surgeon: Pryor Ochoa, MD;  [facility];  Service: Vascular;  Laterality: Right;   IR RETROGRADE PYELOGRAM  12/10/2019   TRANSURETHRAL RESECTION OF PROSTATE  12/10/2019    Allergies  No Known Allergies  Home Medications    Prior to Admission medications   Medication Sig  Start Date End Date Taking? Authorizing Provider  amLODipine (NORVASC) 10 MG tablet Take 1 tablet (10 mg total) by mouth daily. 12/27/22   Wanda Plump, MD  aspirin EC 81 MG tablet Take 81 mg by mouth daily.    [provider]  Continuous Glucose Sensor (FREESTYLE LIBRE 2 SENSOR) MISC INJECT 1 DEVICE INTO THE SKIN  EVERY 14 DAYS 12/14/22   Shamleffer, Konrad Dolores, MD  DULoxetine (CYMBALTA) 30 MG capsule Take 1 capsule (30 mg total) by mouth daily. 03/28/23     empagliflozin (JARDIANCE) 25 MG TABS tablet Take 1 tablet (25 mg total) by mouth daily before breakfast. 12/01/22   Shamleffer, Konrad Dolores, MD  ezetimibe (ZETIA) 10 MG tablet Take 1 tablet (10 mg total) by mouth daily. 12/27/22   Wanda Plump, MD  Fluocinolone Acetonide Scalp (DERMA-SMOOTHE/FS SCALP) 0.01 % OIL Apply 1 application topically to the scalp daily. Patient taking differently: Apply 1 application  topically daily as needed (scalp irritation). 02/26/23     fluticasone (FLONASE) 50 MCG/ACT nasal spray Place 2 sprays into both nostrils daily. 06/20/22   Sharlene Dory, DO  fluticasone-salmeterol (ADVAIR) 100-50 MCG/ACT AEPB Inhale 1 puff into the lungs 2 (two) times daily. 02/15/23   Wanda Plump, MD  ipratropium (ATROVENT) 0.06 % nasal spray Place 1 spray into both nostrils 2 (two) times daily. Can also use as needed prior to meals. 02/01/23     latanoprost (XALATAN) 0.005 % ophthalmic solution Place 1 drop into both eyes at bedtime. 12/28/22     losartan (COZAAR) 100 MG tablet Take 1 tablet (100 mg total) by mouth daily. 03/01/23     metoprolol succinate (TOPROL-XL) 100 MG 24 hr tablet Take 1.5 tablets (150 mg total) by mouth daily. Take with or immediately following a meal. 12/27/22   Wanda Plump, MD  Oxymetazoline HCl (NASAL RELIEF NA) Place 1 spray into the nose in the morning.    [provider]  repaglinide (PRANDIN) 0.5 MG tablet Take 1 tablet (0.5 mg total) by mouth daily before breakfast. 12/01/22   Shamleffer,  Konrad Dolores, MD  rosuvastatin (CRESTOR) 20 MG tablet Take 1 tablet (20 mg total) by mouth daily. 10/30/22   Rollene Rotunda, MD  Semaglutide (RYBELSUS) 14 MG TABS Take 1 tablet (14 mg total) by mouth daily. 12/01/22   Shamleffer, Konrad Dolores, MD    Physical Exam    Vital Signs:  Steven [name] does not have vital signs available for review today.  Given telephonic nature of communication, physical exam is limited. AAOx3. NAD. Normal affect.  Speech and respirations are unlabored.  Accessory Clinical Findings    None  Assessment & Plan    1.  Preoperative Cardiovascular Risk Assessment:  Steven [name] perioperative risk of a major cardiac event is 6.6% according to the Revised Cardiac Risk Index (RCRI).  Therefore, he  is at moderate risk for perioperative complications.   His functional capacity is poor at 3.63 METs according to the Duke Activity Status Index (DASI). Recommendations: According to ACC/AHA guidelines, no further cardiovascular testing needed.  The patient may proceed to surgery at acceptable risk.   Antiplatelet and/or Anticoagulation Recommendations: Aspirin can be held for 5 days prior to his surgery.  Please resume Aspirin post operatively when it is felt to be safe from a bleeding standpoint.    Time:   Today, I have spent 15 minutes with the patient with telehealth technology discussing medical history, symptoms, and management plan.     Sharlene Dory, PA-C  04/10/2023, 3:59 PM

## 2023-04-10 NOTE — Progress Notes (Signed)
Anesthesia Chart Review:  Case: [id number] Date/Time: 04/12/23 0715   Procedure: RIGHT-SIDED LUMBAR 4- LUMBAR 5 TRANSFORAMINAL LUMBAR INTERBODY FUSION AND DECOMPRESSION WITH INSTRUMENTATION AND ALLOGRAFT (Right)   Anesthesia type: General   Pre-op diagnosis: LARGE DISC HERNIATION   [facility] / [facility]   Surgeons: Estill Bamberg, MD       DISCUSSION: Patient is a 77 year old male scheduled for the above procedure.  History includes former smoker quit 02/04/11), COPD, CAD (s/p DES LAD 2002 in [place]), PAD (right FPBG 02/07/13), HTN, HLD, DM2,CKD, chronic dyspnea, sickle cell trait, GERD, glaucoma, skin cancer (BCC), right inguinal hernia repair.  Last office visit with cardiologist Dr. Antoine Poche was on 10/30/22. CAD felt stable without new symptoms. Continue aggressive risk factor modification with no CV testing recommended at that time. Pravastatin was changed to Crestor. BP elevated in office, but well controlled at home. Cr ~ 1.6-1.7. PVD followed by [facility]. Cardiology monitoring moderate LVH periodically, last echo 07/2020 showed LVEF 55-60%, no regional wall motion abnormalities, moderate concentric LVH, normal RV systolic function, normal PASP, estimated RVSP 26.6 mmHg, mild MR.  6-month provider follow-up recommended.    He had preoperative APP telephonic cardiology evaluation with Jari Favre, PA-C on 04/10/2023.  She noted that since last visit, he has not any chest pains or SOB or palpitations. He was not able to meet 4 METS on the DASI with severe limitation from his back and legs for the past 6 months, that that surgery was considered urgent.  mets on the DASI but surgery is urgent. She wrote, "Mr. Claar's perioperative risk of a major cardiac event is 6.6% according to the Revised Cardiac Risk Index (RCRI).  Therefore, he is at moderate risk for perioperative complications.   His functional capacity is poor at 3.63 METs according to the Duke Activity Status Index  (DASI). Recommendations: According to ACC/AHA guidelines, no further cardiovascular testing needed.  The patient may proceed to surgery at acceptable risk.   Antiplatelet and/or Anticoagulation Recommendations: Aspirin can be held for 5 days prior to his surgery.  Please resume Aspirin post operatively when it is felt to be safe from a bleeding standpoint." She noted that his last dose of ASA was on 04/10/23, so she encouraged him to hold until he could confirm instructions with his surgeon. I also left a message for Lupita Leash at Dr. Marshell Levan office.  A1c 6.9% on 03/08/23. He is on Jardiance 25 mg daily, Prandin 0.5 mg daily, and Rybelsus 14 mg daily.  He wears a [employer] 2 glucose monitor. He was instructed to hold Rybelsus for 24 hours prior to surgery and Jardiance for 72 hour prior to surgery, and to hold Prandin on the morning of surgery.   He has pending pulmonology evaluation by Levy Pupa, MD on 05/10/23 after 02/22/23 chest CT showed a RUL lung nodule. He quit smoking in 2012.   Anesthesia team to evaluate on the day of surgery.    VS: BP (!) 151/54   Pulse 65   Temp 36.9 C   Resp 18   Ht 5\' 5"  (1.651 m)   Wt 57.7 kg   SpO2 99%   BMI 21.18 kg/m   PROVIDERS: Wanda Plump, MD is PCP Rollene Rotunda, MD is cardiologist [facility], Brent Bulla, MD is endocrinologist Barnabas Lister, MD is nephrologist ([facility]) Lemar Livings, MD is vascular surgeon   LABS: Labs reviewed: Acceptable for surgery. Creatinine appears stable. He is established with nephrology. A1c 6.9% on 03/08/23. Surgical Nasal PCR  to be recollected. (all labs ordered are listed, but only abnormal results are displayed)  Labs Reviewed  SURGICAL PCR SCREEN - Abnormal; Notable for the following components:      Result Value   MRSA, PCR   (*)    Value: INVALID, UNABLE TO DETERMINE THE PRESENCE OF TARGET DUE TO SPECIMEN INTEGRITY. RECOLLECTION REQUESTED.   Staphylococcus aureus   (*)    Value: INVALID, UNABLE TO  DETERMINE THE PRESENCE OF TARGET DUE TO SPECIMEN INTEGRITY. RECOLLECTION REQUESTED.   All other components within normal limits  GLUCOSE, CAPILLARY - Abnormal; Notable for the following components:   Glucose-Capillary 220 (*)    All other components within normal limits  BASIC METABOLIC PANEL - Abnormal; Notable for the following components:   Glucose, Bld 179 (*)    Creatinine, Ser 1.66 (*)    GFR, Estimated 43 (*)    All other components within normal limits  CBC - Abnormal; Notable for the following components:   Platelets 133 (*)    All other components within normal limits  TYPE AND SCREEN     IMAGES: US Renal 03/14/23 ([facility]): IMPRESSION: 1. No acute abnormality identified.  2. 0.5 cm left renal stone.  3. Questioned bladder diverticulum.   CT Chest 02/22/23: MPRESSION: 1. Interval 1.7 x 1.3 cm mildly irregular right upper lobe nodule. Consider one of the following in 3 months for both low-risk and high-risk individuals: (a) repeat chest CT, (b) follow-up PET-CT, or (c) tissue sampling. This recommendation follows the consensus statement: Guidelines for Management of Incidental Pulmonary Nodules Detected on CT Images: From the Fleischner Society 2017; Radiology 2017; 284:228-243. 2. Extensive, dense atheromatous calcifications, including the coronary arteries and aorta. 3. Borderline cardiomegaly. 4. Stable bibasilar scarring with mild right lower lobe cylindrical bronchiectasis. 5. Stable mild emphysematous changes. - Aortic Atherosclerosis (ICD10-I70.0) and Emphysema (ICD10-J43.9).   MRI L-spine 09/19/22 ([facility]): IMPRESSION:  L4-5: Broad-based disc herniation more prominent towards the right  with caudal migration of disc material in the right lateral recess  down behind L5, extending to the proximal L5-S1 foramen. At the disc  level, there is stenosis of both lateral recesses right more than  left. Disc material encroaches upon the right L4-5 foramen,  possibly  compressing the right L4 nerve. The caudally migrated disc material  extending to the proximal L5-S1 foramen likely affects the right L5  nerve.   CTA Abd Aorta with Iliofemoral Runoff 08/20/22: IMPRESSION: VASCULAR - Heavy atherosclerotic calcifications are noted throughout the arterial tree. Two vessel runoff to the ankles is seen bilaterally. The anterior tibial arteries bilaterally are heavily calcified and diminutive. - Bilateral superficial femoral artery occlusions. A common femoral to popliteal artery bypass is noted on the right which is widely patent. On the left there is reconstitution of the popliteal artery via muscular collaterals below the level of the adductor canal. NON-VASCULAR - No acute abnormality noted.     EKG: 10/30/2022 ([facility]): Sinus rhythm with first-degree AV block Left ventricular hypertrophy with repolarization abnormality   CV: Echo 08/03/20: IMPRESSIONS   1. Left ventricular ejection fraction, by estimation, is 55 to 60%. Left  ventricular ejection fraction by 3D volume is 54 %. The left ventricle has  normal function. The left ventricle has no regional wall motion  abnormalities. There is moderate  concentric left ventricular hypertrophy. Left ventricular diastolic  parameters are indeterminate.   2. Right ventricular systolic function is normal. The right ventricular  size is normal. There is normal pulmonary artery  systolic pressure. The  estimated right ventricular systolic pressure is 26.6 mmHg.   3. The mitral valve is grossly normal. Mild mitral valve regurgitation.  No evidence of mitral stenosis.   4. The aortic valve is tricuspid. Aortic valve regurgitation is trivial.  No aortic stenosis is present.   5. The inferior vena cava is normal in size with greater than 50%  respiratory variability, suggesting right atrial pressure of 3 mmHg.    US Carotid 05/14/17: Impression: Less than 40% bilateral internal carotid artery  stenosis.  Nuclear stress test 01/29/13:  Overall Impression: Low risk stress nuclear study. 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 There is minimal reversibility. There is mild decrease of LV systolic function with mild inferior wall hypokinesis. LV Ejection Fraction: 45%. LV Wall Motion: Mild inferior wall hypokinesis.     LHC 12/05/10 ([facility]): Findings: - LM: No disease.  Bifurcates into the LAD and circumflex artery. - LAD: Medium size vessel. 75-80% mid stenosis. - LCX: Medium size vessel. 30-40% distal stenosis. - RCA: Known CTO proximally. Collaterals distal of RCA. Impression: 1.  Coronary artery disease. 2.  Successful drug-eluting stent to the left anterior descending artery. 3.  Known total occlusion to the right coronary artery. 4.  Normal LV function. EF > 55%.    Past Medical History:  Diagnosis Date   Allergy    Arthritis    Right Hip   Atrial fibrillation ([name])    BCC (basal cell carcinoma of skin)    BPH (benign prostatic hyperplasia)    COPD (chronic obstructive pulmonary disease) ([name])    Coronary artery disease    Lexiscan Myoview 7/14:  Inf scar with minimal reversibility, inf HK, EF 45%;  Low risk.  Promus LAD stent placed in [place].  100% RCA   Diabetes 1.5, managed as type 1 ([name])    Diverticulosis    on CT   Dyslipidemia    GERD (gastroesophageal reflux disease)    has used Zantac in the past    Glaucoma    Hammer toes, bilateral    Hernia    Hyperlipidemia    Hypertension    Peripheral vascular disease ([name])    a. s/p R fem-pop BPG 01/2013 (Dr. 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    Past Surgical History:  Procedure Laterality Date   ABDOMINAL AORTAGRAM N/A 12/10/2012   Procedure: ABDOMINAL Ronny Flurry;  Surgeon: Nada Libman, MD;  Location: [facility] CATH LAB;   Service: Cardiovascular;  Laterality: N/A;   CARDIAC CATHETERIZATION  12/05/2010   with stent placement   FEMORAL-POPLITEAL BYPASS GRAFT Right 02/07/2013   Procedure: BYPASS GRAFT FEMORAL-POPLITEAL ARTERY- RIGHT;  Surgeon: Pryor Ochoa, MD;  Location: [facility] OR;  Service: Vascular;  Laterality: Right;  Right femoral to below knee popliteal bypass using right non reversed great saphenous vein   INGUINAL HERNIA REPAIR  2010   Right   INGUINAL HERNIA REPAIR  09/11/2018   INTRAOPERATIVE ARTERIOGRAM Right 02/07/2013   Procedure: Attempted INTRA OPERATIVE ARTERIOGRAM;  Surgeon: Pryor Ochoa, MD;  Location: [facility] OR;  Service: Vascular;  Laterality: Right;   IR RETROGRADE PYELOGRAM  12/10/2019   TRANSURETHRAL RESECTION OF PROSTATE  12/10/2019    MEDICATIONS:  amLODipine (NORVASC) 10 MG tablet   aspirin EC 81 MG tablet   Continuous Glucose Sensor (FREESTYLE LIBRE 2 SENSOR) MISC  DULoxetine (CYMBALTA) 30 MG capsule   empagliflozin (JARDIANCE) 25 MG TABS tablet   ezetimibe (ZETIA) 10 MG tablet   Fluocinolone Acetonide Scalp (DERMA-SMOOTHE/FS SCALP) 0.01 % OIL   fluticasone (FLONASE) 50 MCG/ACT nasal spray   fluticasone-salmeterol (ADVAIR) 100-50 MCG/ACT AEPB   ipratropium (ATROVENT) 0.06 % nasal spray   latanoprost (XALATAN) 0.005 % ophthalmic solution   losartan (COZAAR) 100 MG tablet   metoprolol succinate (TOPROL-XL) 100 MG 24 hr tablet   Oxymetazoline HCl (NASAL RELIEF NA)   repaglinide (PRANDIN) 0.5 MG tablet   rosuvastatin (CRESTOR) 20 MG tablet   Semaglutide (RYBELSUS) 14 MG TABS   No current facility-administered medications for this encounter.    Shonna Chock, PA-C Surgical Short Stay/Anesthesiology [facility] Phone [phone] [facility] Phone [phone] 04/10/2023 5:07 PM

## 2023-04-12 ENCOUNTER — Ambulatory Visit (HOSPITAL_COMMUNITY)
Admission: AD | Disposition: A | Discharge status: Home / Self Care | Payer: Self-pay | Source: Home / Self Care | Attending: Orthopedic Surgery

## 2023-04-12 ENCOUNTER — Ambulatory Visit (HOSPITAL_COMMUNITY): Payer: Medicare Other

## 2023-04-12 ENCOUNTER — Ambulatory Visit (HOSPITAL_COMMUNITY): Payer: Medicare Other | Admitting: Vascular Surgery

## 2023-04-12 ENCOUNTER — Observation Stay (HOSPITAL_COMMUNITY)
Admission: AD | Admit: 2023-04-12 | Discharge: 2023-04-13 | Disposition: A | Discharge status: Home / Self Care | Payer: Medicare Other | Attending: Orthopedic Surgery | Admitting: Orthopedic Surgery

## 2023-04-12 ENCOUNTER — Encounter (HOSPITAL_COMMUNITY): Payer: Self-pay | Admitting: Orthopedic Surgery

## 2023-04-12 ENCOUNTER — Other Ambulatory Visit (HOSPITAL_COMMUNITY): Payer: Self-pay

## 2023-04-12 ENCOUNTER — Other Ambulatory Visit: Payer: Self-pay

## 2023-04-12 DIAGNOSIS — N182 Chronic kidney disease, stage 2 (mild): Secondary | ICD-10-CM | POA: Diagnosis not present

## 2023-04-12 DIAGNOSIS — I129 Hypertensive chronic kidney disease with stage 1 through stage 4 chronic kidney disease, or unspecified chronic kidney disease: Secondary | ICD-10-CM | POA: Diagnosis not present

## 2023-04-12 DIAGNOSIS — J449 Chronic obstructive pulmonary disease, unspecified: Secondary | ICD-10-CM | POA: Diagnosis not present

## 2023-04-12 DIAGNOSIS — Z85828 Personal history of other malignant neoplasm of skin: Secondary | ICD-10-CM | POA: Diagnosis not present

## 2023-04-12 DIAGNOSIS — Z79899 Other long term (current) drug therapy: Secondary | ICD-10-CM | POA: Diagnosis not present

## 2023-04-12 DIAGNOSIS — M5126 Other intervertebral disc displacement, lumbar region: Secondary | ICD-10-CM

## 2023-04-12 DIAGNOSIS — M5416 Radiculopathy, lumbar region: Principal | ICD-10-CM | POA: Diagnosis present

## 2023-04-12 DIAGNOSIS — M48061 Spinal stenosis, lumbar region without neurogenic claudication: Secondary | ICD-10-CM | POA: Insufficient documentation

## 2023-04-12 DIAGNOSIS — I2510 Atherosclerotic heart disease of native coronary artery without angina pectoris: Secondary | ICD-10-CM | POA: Diagnosis not present

## 2023-04-12 DIAGNOSIS — Z794 Long term (current) use of insulin: Secondary | ICD-10-CM

## 2023-04-12 DIAGNOSIS — E119 Type 2 diabetes mellitus without complications: Secondary | ICD-10-CM | POA: Diagnosis not present

## 2023-04-12 DIAGNOSIS — E1165 Type 2 diabetes mellitus with hyperglycemia: Secondary | ICD-10-CM

## 2023-04-12 DIAGNOSIS — Z7982 Long term (current) use of aspirin: Secondary | ICD-10-CM | POA: Diagnosis not present

## 2023-04-12 DIAGNOSIS — I10 Essential (primary) hypertension: Secondary | ICD-10-CM | POA: Diagnosis not present

## 2023-04-12 DIAGNOSIS — Z87891 Personal history of nicotine dependence: Secondary | ICD-10-CM | POA: Insufficient documentation

## 2023-04-12 DIAGNOSIS — I4891 Unspecified atrial fibrillation: Secondary | ICD-10-CM | POA: Insufficient documentation

## 2023-04-12 DIAGNOSIS — E1122 Type 2 diabetes mellitus with diabetic chronic kidney disease: Secondary | ICD-10-CM | POA: Diagnosis not present

## 2023-04-12 DIAGNOSIS — Z01818 Encounter for other preprocedural examination: Principal | ICD-10-CM

## 2023-04-12 HISTORY — PX: TRANSFORAMINAL LUMBAR INTERBODY FUSION (TLIF) WITH PEDICLE SCREW FIXATION 1 LEVEL: SHX6141

## 2023-04-12 LAB — GLUCOSE, CAPILLARY
Glucose-Capillary: 148 mg/dL — ABNORMAL HIGH (ref 70–99)
Glucose-Capillary: 158 mg/dL — ABNORMAL HIGH (ref 70–99)
Glucose-Capillary: 171 mg/dL — ABNORMAL HIGH (ref 70–99)
Glucose-Capillary: 272 mg/dL — ABNORMAL HIGH (ref 70–99)
Glucose-Capillary: 254 mg/dL — ABNORMAL HIGH (ref 70–99)

## 2023-04-12 LAB — SURGICAL PCR SCREEN
MRSA, PCR: NEGATIVE
Staphylococcus aureus: NEGATIVE

## 2023-04-12 SURGERY — TRANSFORAMINAL LUMBAR INTERBODY FUSION (TLIF) WITH PEDICLE SCREW FIXATION 1 LEVEL
Anesthesia: General | Site: Spine Lumbar | Laterality: Right

## 2023-04-12 MED ORDER — MOMETASONE FURO-FORMOTEROL FUM 100-5 MCG/ACT IN AERO: 2.0000 | INHALATION_SPRAY | Freq: Two times a day (BID) | RESPIRATORY_TRACT | Status: DC

## 2023-04-12 MED ORDER — THROMBIN 20000 UNITS EX SOLR
CUTANEOUS | Status: DC | PRN
  Administered 2023-04-12: 20000 [IU] via TOPICAL

## 2023-04-12 MED ORDER — KETAMINE HCL 10 MG/ML IJ SOLN
INTRAMUSCULAR | Status: DC | PRN
Start: 2023-04-12 — End: 2023-04-12
  Administered 2023-04-12: 10 mg via INTRAVENOUS
  Administered 2023-04-12: 25 mg via INTRAVENOUS

## 2023-04-12 MED ORDER — FENTANYL CITRATE (PF) 250 MCG/5ML IJ SOLN
INTRAMUSCULAR | Status: DC | PRN
  Administered 2023-04-12 (×2): 50 ug via INTRAVENOUS

## 2023-04-12 MED ORDER — METHOCARBAMOL 500 MG PO TABS
500.0000 mg | ORAL_TABLET | Freq: Four times a day (QID) | ORAL | Status: DC | PRN
  Administered 2023-04-12 – 2023-04-13 (×2): 500 mg via ORAL
  Filled 2023-04-12 (×2): qty 1

## 2023-04-12 MED ORDER — LIDOCAINE 2% (20 MG/ML) 5 ML SYRINGE
INTRAMUSCULAR | Status: DC | PRN
  Administered 2023-04-12: 100 mg via INTRAVENOUS

## 2023-04-12 MED ORDER — BUPIVACAINE LIPOSOME 1.3 % IJ SUSP
INTRAMUSCULAR | Status: AC
  Filled 2023-04-12: qty 20

## 2023-04-12 MED ORDER — LATANOPROST 0.005 % OP SOLN
1.0000 [drp] | Freq: Every day | OPHTHALMIC | Status: DC
  Administered 2023-04-12: 1 [drp] via OPHTHALMIC
  Filled 2023-04-12: qty 2.5

## 2023-04-12 MED ORDER — METHOCARBAMOL 1000 MG/10ML IJ SOLN: 500.0000 mg | Freq: Four times a day (QID) | INTRAVENOUS | Status: DC | PRN

## 2023-04-12 MED ORDER — FLUOCINOLONE ACETONIDE SCALP 0.01 % EX OIL: 1.0000 "application " | TOPICAL_OIL | Freq: Every day | CUTANEOUS | Status: DC

## 2023-04-12 MED ORDER — LOSARTAN POTASSIUM 50 MG PO TABS
100.0000 mg | ORAL_TABLET | Freq: Every day | ORAL | Status: DC
  Administered 2023-04-12 – 2023-04-13 (×2): 100 mg via ORAL
  Filled 2023-04-12 (×2): qty 2

## 2023-04-12 MED ORDER — SUGAMMADEX SODIUM 200 MG/2ML IV SOLN
INTRAVENOUS | Status: DC | PRN
  Administered 2023-04-12: 200 mg via INTRAVENOUS

## 2023-04-12 MED ORDER — OXYCODONE HCL 5 MG PO TABS: 5.0000 mg | ORAL_TABLET | Freq: Once | ORAL | Status: DC | PRN

## 2023-04-12 MED ORDER — CHLORHEXIDINE GLUCONATE 0.12 % MT SOLN
15.0000 mL | Freq: Once | OROMUCOSAL | Status: AC
  Administered 2023-04-12: 15 mL via OROMUCOSAL
  Filled 2023-04-12: qty 15

## 2023-04-12 MED ORDER — PHENYLEPHRINE HCL-NACL 20-0.9 MG/250ML-% IV SOLN
INTRAVENOUS | Status: DC | PRN
Start: 2023-04-12 — End: 2023-04-12
  Administered 2023-04-12: 30 ug/min via INTRAVENOUS
  Administered 2023-04-12: 80 ug via INTRAVENOUS

## 2023-04-12 MED ORDER — AMLODIPINE BESYLATE 10 MG PO TABS
10.0000 mg | ORAL_TABLET | Freq: Every day | ORAL | Status: DC
  Administered 2023-04-13: 10 mg via ORAL
  Filled 2023-04-12: qty 1

## 2023-04-12 MED ORDER — HYDROMORPHONE HCL 1 MG/ML IJ SOLN
INTRAMUSCULAR | Status: DC | PRN
Start: 2023-04-12 — End: 2023-04-12
  Administered 2023-04-12: .5 mg via INTRAVENOUS

## 2023-04-12 MED ORDER — REPAGLINIDE 1 MG PO TABS
0.5000 mg | ORAL_TABLET | Freq: Every day | ORAL | Status: DC
  Administered 2023-04-13: 0.5 mg via ORAL
  Filled 2023-04-12: qty 0.5

## 2023-04-12 MED ORDER — CEFAZOLIN SODIUM-DEXTROSE 2-4 GM/100ML-% IV SOLN
2.0000 g | INTRAVENOUS | Status: AC
  Administered 2023-04-12: 2 g via INTRAVENOUS
  Filled 2023-04-12: qty 100

## 2023-04-12 MED ORDER — DEXAMETHASONE SODIUM PHOSPHATE 10 MG/ML IJ SOLN
INTRAMUSCULAR | Status: AC
  Filled 2023-04-12: qty 1

## 2023-04-12 MED ORDER — ORAL CARE MOUTH RINSE: 15.0000 mL | Freq: Once | OROMUCOSAL | Status: AC

## 2023-04-12 MED ORDER — ROCURONIUM BROMIDE 10 MG/ML (PF) SYRINGE
PREFILLED_SYRINGE | INTRAVENOUS | Status: DC | PRN
  Administered 2023-04-12: 70 mg via INTRAVENOUS
  Administered 2023-04-12: 10 mg via INTRAVENOUS
  Administered 2023-04-12: 20 mg via INTRAVENOUS

## 2023-04-12 MED ORDER — ONDANSETRON HCL 4 MG/2ML IJ SOLN: 4.0000 mg | Freq: Four times a day (QID) | INTRAMUSCULAR | Status: DC | PRN

## 2023-04-12 MED ORDER — CEFAZOLIN SODIUM-DEXTROSE 1-4 GM/50ML-% IV SOLN
1.0000 g | Freq: Three times a day (TID) | INTRAVENOUS | Status: AC
  Administered 2023-04-12 (×2): 1 g via INTRAVENOUS
  Filled 2023-04-12 (×2): qty 50

## 2023-04-12 MED ORDER — EZETIMIBE 10 MG PO TABS
10.0000 mg | ORAL_TABLET | Freq: Every day | ORAL | Status: DC
  Administered 2023-04-13: 10 mg via ORAL
  Filled 2023-04-12: qty 1

## 2023-04-12 MED ORDER — POVIDONE-IODINE 7.5 % EX SOLN: Freq: Once | CUTANEOUS | Status: DC

## 2023-04-12 MED ORDER — EMPAGLIFLOZIN 25 MG PO TABS
25.0000 mg | ORAL_TABLET | Freq: Every day | ORAL | Status: DC
  Administered 2023-04-13: 25 mg via ORAL
  Filled 2023-04-12: qty 1

## 2023-04-12 MED ORDER — METOPROLOL SUCCINATE ER 50 MG PO TB24
150.0000 mg | ORAL_TABLET | Freq: Every day | ORAL | Status: DC
  Administered 2023-04-13: 150 mg via ORAL
  Filled 2023-04-12: qty 1

## 2023-04-12 MED ORDER — BUPIVACAINE-EPINEPHRINE 0.25% -1:200000 IJ SOLN
INTRAMUSCULAR | Status: DC | PRN
  Administered 2023-04-12: 20 mL
  Administered 2023-04-12: 10 mL

## 2023-04-12 MED ORDER — BUPIVACAINE HCL (PF) 0.25 % IJ SOLN
INTRAMUSCULAR | Status: AC
  Filled 2023-04-12: qty 30

## 2023-04-12 MED ORDER — ROCURONIUM BROMIDE 10 MG/ML (PF) SYRINGE
PREFILLED_SYRINGE | INTRAVENOUS | Status: AC
  Filled 2023-04-12: qty 10

## 2023-04-12 MED ORDER — HYDROMORPHONE HCL 1 MG/ML IJ SOLN: 0.2500 mg | INTRAMUSCULAR | Status: DC | PRN

## 2023-04-12 MED ORDER — ROSUVASTATIN CALCIUM 20 MG PO TABS
20.0000 mg | ORAL_TABLET | Freq: Every day | ORAL | Status: DC
  Administered 2023-04-13: 20 mg via ORAL
  Filled 2023-04-12: qty 1

## 2023-04-12 MED ORDER — INSULIN ASPART 100 UNIT/ML IJ SOLN
0.0000 [IU] | Freq: Every day | INTRAMUSCULAR | Status: DC
  Administered 2023-04-12: 3 [IU] via SUBCUTANEOUS

## 2023-04-12 MED ORDER — INSULIN ASPART 100 UNIT/ML IJ SOLN
0.0000 [IU] | Freq: Three times a day (TID) | INTRAMUSCULAR | Status: DC
  Administered 2023-04-13: 5 [IU] via SUBCUTANEOUS

## 2023-04-12 MED ORDER — PROPOFOL 10 MG/ML IV BOLUS
INTRAVENOUS | Status: DC | PRN
  Administered 2023-04-12: 160 mg via INTRAVENOUS

## 2023-04-12 MED ORDER — POTASSIUM CHLORIDE IN NACL 20-0.9 MEQ/L-% IV SOLN: INTRAVENOUS | Status: DC

## 2023-04-12 MED ORDER — 0.9 % SODIUM CHLORIDE (POUR BTL) OPTIME
TOPICAL | Status: DC | PRN
  Administered 2023-04-12: 2000 mL

## 2023-04-12 MED ORDER — PHENOL 1.4 % MT LIQD: 1.0000 | OROMUCOSAL | Status: DC | PRN

## 2023-04-12 MED ORDER — ACETAMINOPHEN 325 MG PO TABS: 650.0000 mg | ORAL_TABLET | ORAL | Status: DC | PRN

## 2023-04-12 MED ORDER — HYDROMORPHONE HCL 1 MG/ML IJ SOLN
INTRAMUSCULAR | Status: AC
  Filled 2023-04-12: qty 0.5

## 2023-04-12 MED ORDER — SODIUM CHLORIDE 0.9% FLUSH: 3.0000 mL | INTRAVENOUS | Status: DC | PRN

## 2023-04-12 MED ORDER — ONDANSETRON HCL 4 MG/2ML IJ SOLN: 4.0000 mg | Freq: Once | INTRAMUSCULAR | Status: DC | PRN

## 2023-04-12 MED ORDER — ACETAMINOPHEN 650 MG RE SUPP: 650.0000 mg | RECTAL | Status: DC | PRN

## 2023-04-12 MED ORDER — SODIUM CHLORIDE 0.9% FLUSH: 3.0000 mL | Freq: Two times a day (BID) | INTRAVENOUS | Status: DC

## 2023-04-12 MED ORDER — SODIUM CHLORIDE 0.9 % IV SOLN
250.0000 mL | INTRAVENOUS | Status: DC
  Administered 2023-04-12: 250 mL via INTRAVENOUS

## 2023-04-12 MED ORDER — ZOLPIDEM TARTRATE 5 MG PO TABS: 5.0000 mg | ORAL_TABLET | Freq: Every evening | ORAL | Status: DC | PRN

## 2023-04-12 MED ORDER — THROMBIN 20000 UNITS EX KIT
PACK | CUTANEOUS | Status: AC
  Filled 2023-04-12: qty 1

## 2023-04-12 MED ORDER — ALUM & MAG HYDROXIDE-SIMETH 200-200-20 MG/5ML PO SUSP: 30.0000 mL | Freq: Four times a day (QID) | ORAL | Status: DC | PRN

## 2023-04-12 MED ORDER — SEMAGLUTIDE 14 MG PO TABS: 14.0000 mg | ORAL_TABLET | Freq: Every day | ORAL | Status: DC

## 2023-04-12 MED ORDER — ACETAMINOPHEN 10 MG/ML IV SOLN
INTRAVENOUS | Status: AC
  Filled 2023-04-12: qty 100

## 2023-04-12 MED ORDER — KETAMINE HCL 50 MG/5ML IJ SOSY
PREFILLED_SYRINGE | INTRAMUSCULAR | Status: AC
  Filled 2023-04-12: qty 5

## 2023-04-12 MED ORDER — BUPIVACAINE LIPOSOME 1.3 % IJ SUSP
INTRAMUSCULAR | Status: DC | PRN
  Administered 2023-04-12: 20 mL

## 2023-04-12 MED ORDER — FENTANYL CITRATE (PF) 250 MCG/5ML IJ SOLN
INTRAMUSCULAR | Status: AC
  Filled 2023-04-12: qty 5

## 2023-04-12 MED ORDER — PROPOFOL 10 MG/ML IV BOLUS
INTRAVENOUS | Status: AC
  Filled 2023-04-12: qty 20

## 2023-04-12 MED ORDER — DEXAMETHASONE SODIUM PHOSPHATE 10 MG/ML IJ SOLN
INTRAMUSCULAR | Status: DC | PRN
  Administered 2023-04-12: 10 mg via INTRAVENOUS

## 2023-04-12 MED ORDER — LACTATED RINGERS IV SOLN: INTRAVENOUS | Status: DC

## 2023-04-12 MED ORDER — IPRATROPIUM BROMIDE 0.06 % NA SOLN
1.0000 | Freq: Two times a day (BID) | NASAL | Status: DC
  Administered 2023-04-13: 1 via NASAL
  Filled 2023-04-12: qty 15

## 2023-04-12 MED ORDER — MENTHOL 3 MG MT LOZG: 1.0000 | LOZENGE | OROMUCOSAL | Status: DC | PRN

## 2023-04-12 MED ORDER — LIDOCAINE 2% (20 MG/ML) 5 ML SYRINGE
INTRAMUSCULAR | Status: AC
  Filled 2023-04-12: qty 5

## 2023-04-12 MED ORDER — DULOXETINE HCL 30 MG PO CPEP
30.0000 mg | ORAL_CAPSULE | Freq: Every day | ORAL | Status: DC
  Administered 2023-04-13: 30 mg via ORAL
  Filled 2023-04-12: qty 1

## 2023-04-12 MED ORDER — FLUTICASONE PROPIONATE 50 MCG/ACT NA SUSP
2.0000 | Freq: Every day | NASAL | Status: DC
  Administered 2023-04-13: 2 via NASAL
  Filled 2023-04-12: qty 16

## 2023-04-12 MED ORDER — OXYCODONE HCL 5 MG/5ML PO SOLN: 5.0000 mg | Freq: Once | ORAL | Status: DC | PRN

## 2023-04-12 MED ORDER — ONDANSETRON HCL 4 MG PO TABS: 4.0000 mg | ORAL_TABLET | Freq: Four times a day (QID) | ORAL | Status: DC | PRN

## 2023-04-12 MED ORDER — ONDANSETRON HCL 4 MG/2ML IJ SOLN
INTRAMUSCULAR | Status: AC
  Filled 2023-04-12: qty 2

## 2023-04-12 MED ORDER — OXYCODONE-ACETAMINOPHEN 5-325 MG PO TABS
1.0000 | ORAL_TABLET | ORAL | 0 refills | Status: DC | PRN
Start: 2023-04-12 — End: 2023-11-07
  Filled 2023-04-12: qty 30, 3d supply, fill #0

## 2023-04-12 MED ORDER — ONDANSETRON HCL 4 MG/2ML IJ SOLN
INTRAMUSCULAR | Status: DC | PRN
  Administered 2023-04-12: 4 mg via INTRAVENOUS

## 2023-04-12 MED ORDER — INSULIN ASPART 100 UNIT/ML IJ SOLN: 0.0000 [IU] | INTRAMUSCULAR | Status: DC | PRN

## 2023-04-12 MED ORDER — ACETAMINOPHEN 10 MG/ML IV SOLN
INTRAVENOUS | Status: DC | PRN
Start: 2023-04-12 — End: 2023-04-12
  Administered 2023-04-12: 1000 mg via INTRAVENOUS

## 2023-04-12 MED ORDER — METHOCARBAMOL 500 MG PO TABS
500.0000 mg | ORAL_TABLET | Freq: Four times a day (QID) | ORAL | 2 refills | Status: DC | PRN
  Filled 2023-04-12: qty 30, 8d supply, fill #0
  Filled 2023-04-25: qty 30, 8d supply, fill #1
  Filled 2023-04-25: qty 30, 8d supply, fill #0

## 2023-04-12 MED ORDER — OXYMETAZOLINE HCL 0.05 % NA SOLN: 1.0000 | Freq: Two times a day (BID) | NASAL | Status: DC | PRN

## 2023-04-12 MED ORDER — OXYCODONE-ACETAMINOPHEN 5-325 MG PO TABS
1.0000 | ORAL_TABLET | ORAL | Status: DC | PRN
  Administered 2023-04-12 – 2023-04-13 (×5): 2 via ORAL
  Filled 2023-04-12 (×5): qty 2

## 2023-04-12 SURGICAL SUPPLY — 86 items
AGENT HMST KT MTR STRL THRMB (HEMOSTASIS)
APL SKNCLS STERI-STRIP NONHPOA (GAUZE/BANDAGES/DRESSINGS) ×1
BAG COUNTER SPONGE SURGICOUNT (BAG) ×1 IMPLANT
BAG SPNG CNTER NS LX DISP (BAG) ×1
BENZOIN TINCTURE PRP APPL 2/3 (GAUZE/BANDAGES/DRESSINGS) ×1 IMPLANT
BLADE CLIPPER SURG (BLADE) IMPLANT
BUR PRESCISION 1.7 ELITE (BURR) ×1 IMPLANT
BUR ROUND FLUTED 5 RND (BURR) ×1 IMPLANT
BUR ROUND PRECISION 4.0 (BURR) IMPLANT
BUR SABER RD CUTTING 3.0 (BURR) IMPLANT
CAGE SABLE 10X26 6-12 8D (Cage) IMPLANT
CNTNR URN SCR LID CUP LEK RST (MISCELLANEOUS) ×1 IMPLANT
CONT SPEC 4OZ STRL OR WHT (MISCELLANEOUS) ×1
COVER BACK TABLE 60X90IN (DRAPES) ×1 IMPLANT
COVER MAYO STAND STRL (DRAPES) ×2 IMPLANT
COVER SURGICAL LIGHT HANDLE (MISCELLANEOUS) ×1 IMPLANT
DRAIN CHANNEL 15F RND FF W/TCR (WOUND CARE) IMPLANT
DRAPE C-ARM 42X72 X-RAY (DRAPES) ×1 IMPLANT
DRAPE C-ARMOR (DRAPES) IMPLANT
DRAPE POUCH INSTRU U-SHP 10X18 (DRAPES) ×1 IMPLANT
DRAPE SURG 17X23 STRL (DRAPES) ×4 IMPLANT
DURAPREP 26ML APPLICATOR (WOUND CARE) ×1 IMPLANT
ELECT BLADE 4.0 EZ CLEAN MEGAD (MISCELLANEOUS) ×1
ELECT CAUTERY BLADE 6.4 (BLADE) ×1 IMPLANT
ELECT REM PT RETURN 9FT ADLT (ELECTROSURGICAL) ×1
ELECTRODE BLDE 4.0 EZ CLN MEGD (MISCELLANEOUS) ×1 IMPLANT
ELECTRODE REM PT RTRN 9FT ADLT (ELECTROSURGICAL) ×1 IMPLANT
EVACUATOR SILICONE 100CC (DRAIN) IMPLANT
FILTER STRAW FLUID ASPIR (MISCELLANEOUS) ×1 IMPLANT
GAUZE 4X4 16PLY ~~LOC~~+RFID DBL (SPONGE) ×1 IMPLANT
GAUZE SPONGE 4X4 12PLY STRL (GAUZE/BANDAGES/DRESSINGS) ×1 IMPLANT
GLOVE BIO SURGEON STRL SZ 6.5 (GLOVE) ×1 IMPLANT
GLOVE BIO SURGEON STRL SZ8 (GLOVE) ×1 IMPLANT
GLOVE BIOGEL PI IND STRL 7.0 (GLOVE) ×1 IMPLANT
GLOVE BIOGEL PI IND STRL 8 (GLOVE) ×1 IMPLANT
GLOVE SURG ENC MOIS LTX SZ6.5 (GLOVE) ×1 IMPLANT
GOWN STRL REUS W/ TWL LRG LVL3 (GOWN DISPOSABLE) ×2 IMPLANT
GOWN STRL REUS W/ TWL XL LVL3 (GOWN DISPOSABLE) ×1 IMPLANT
GOWN STRL REUS W/TWL LRG LVL3 (GOWN DISPOSABLE) ×2
GOWN STRL REUS W/TWL XL LVL3 (GOWN DISPOSABLE) ×1
IV CATH 14GX2 1/4 (CATHETERS) ×1 IMPLANT
KIT BASIN OR (CUSTOM PROCEDURE TRAY) ×1 IMPLANT
KIT POSITION SURG JACKSON T1 (MISCELLANEOUS) ×1 IMPLANT
KIT TURNOVER KIT B (KITS) ×1 IMPLANT
MARKER SKIN DUAL TIP RULER LAB (MISCELLANEOUS) ×2 IMPLANT
MIX DBX 10CC 35% BONE (Bone Implant) IMPLANT
NDL 18GX1X1/2 (RX/OR ONLY) (NEEDLE) ×1 IMPLANT
NDL 22X1.5 STRL (OR ONLY) (MISCELLANEOUS) ×2 IMPLANT
NDL HYPO 25GX1X1/2 BEV (NEEDLE) ×1 IMPLANT
NDL SPNL 18GX3.5 QUINCKE PK (NEEDLE) ×2 IMPLANT
NEEDLE 18GX1X1/2 (RX/OR ONLY) (NEEDLE) ×1 IMPLANT
NEEDLE 22X1.5 STRL (OR ONLY) (MISCELLANEOUS) ×2 IMPLANT
NEEDLE HYPO 25GX1X1/2 BEV (NEEDLE) ×1 IMPLANT
NEEDLE SPNL 18GX3.5 QUINCKE PK (NEEDLE) ×2 IMPLANT
NS IRRIG 1000ML POUR BTL (IV SOLUTION) ×1 IMPLANT
PACK LAMINECTOMY ORTHO (CUSTOM PROCEDURE TRAY) ×1 IMPLANT
PACK UNIVERSAL I (CUSTOM PROCEDURE TRAY) ×1 IMPLANT
PAD ARMBOARD 7.5X6 YLW CONV (MISCELLANEOUS) ×2 IMPLANT
PATTIES SURGICAL .5 X1 (DISPOSABLE) ×1 IMPLANT
PATTIES SURGICAL .5X1.5 (GAUZE/BANDAGES/DRESSINGS) ×1 IMPLANT
PUTTY DBX 2.5CC (Putty) ×1 IMPLANT
PUTTY DBX 2.5CC DEPUY (Putty) IMPLANT
ROD PRE BENT EXP 40MM (Rod) IMPLANT
SCREW SET SINGLE INNER (Screw) IMPLANT
SCREW VIPER CORT FIX 6.00X30 (Screw) IMPLANT
SPONGE INTESTINAL PEANUT (DISPOSABLE) ×1 IMPLANT
SPONGE SURGIFOAM ABS GEL 100 (HEMOSTASIS) ×1 IMPLANT
STRIP CLOSURE SKIN 1/2X4 (GAUZE/BANDAGES/DRESSINGS) ×2 IMPLANT
SURGIFLO W/THROMBIN 8M KIT (HEMOSTASIS) IMPLANT
SUT MNCRL AB 4-0 PS2 18 (SUTURE) ×1 IMPLANT
SUT VIC AB 0 CT1 18XCR BRD 8 (SUTURE) ×1 IMPLANT
SUT VIC AB 0 CT1 8-18 (SUTURE) ×1
SUT VIC AB 1 CT1 18XCR BRD 8 (SUTURE) ×1 IMPLANT
SUT VIC AB 1 CT1 8-18 (SUTURE) ×1
SUT VIC AB 2-0 CT2 18 VCP726D (SUTURE) ×1 IMPLANT
SYR 20ML LL LF (SYRINGE) ×2 IMPLANT
SYR BULB IRRIG 60ML STRL (SYRINGE) ×1 IMPLANT
SYR CONTROL 10ML LL (SYRINGE) ×2 IMPLANT
SYR TB 1ML LUER SLIP (SYRINGE) ×1 IMPLANT
TAP EXPEDIUM DL 4.35 (INSTRUMENTS) IMPLANT
TAP EXPEDIUM DL 5.0 (INSTRUMENTS) IMPLANT
TAP EXPEDIUM DL 6.0 (INSTRUMENTS) IMPLANT
TRAY FOLEY MTR SLVR 16FR STAT (SET/KITS/TRAYS/PACK) ×1 IMPLANT
TUBE FUNNEL GL DISP (ORTHOPEDIC DISPOSABLE SUPPLIES) IMPLANT
WATER STERILE IRR 1000ML POUR (IV SOLUTION) ×1 IMPLANT
YANKAUER SUCT BULB TIP NO VENT (SUCTIONS) ×1 IMPLANT

## 2023-04-12 NOTE — Care Management CC44 (Signed)
Condition Code 44 Documentation Completed  Patient Details  Name: Steven [name] MRN: [id number] Date of Birth: July 01, 1946   Condition Code 44 given:  Yes Patient signature on Condition Code 44 notice:  Yes Documentation of 2 MD's agreement:  Yes Code 44 added to claim:  Yes    Michel Bickers, RN 04/12/2023, 5:40 PM

## 2023-04-12 NOTE — Care Management Obs Status (Signed)
MEDICARE OBSERVATION STATUS NOTIFICATION   Patient Details  Name: Steven [name] MRN: [id number] Date of Birth: 20-Jul-1946   Medicare Observation Status Notification Given:  Waylan Boga, RN 04/12/2023, 5:39 PM

## 2023-04-12 NOTE — Op Note (Signed)
PATIENT NAME: Steven [name]   MEDICAL RECORD NO.:   [id number]   DATE OF BIRTH: May 06, 1946   DATE OF PROCEDURE: 04/12/2023                               OPERATIVE REPORT     PREOPERATIVE DIAGNOSES: 1. Right-sided lumbar radiculopathy. 2. L4-5 spinal stenosis. 3. Large right-sided L4/5 disc herniation, affecting the right lateral recess, foramen, and extraforaminal regions   POSTOPERATIVE DIAGNOSES: 1. Right-sided lumbar radiculopathy. 2. L4-5 spinal stenosis. 3. Large right-sided L4/5 disc herniation, affecting the right lateral recess, foramen, and extraforaminal regions   PROCEDURES: 1. L4/5 decompression 2. Right-sided L4-5 transforaminal lumbar interbody fusion. 3. Left-sided L4-5 posterolateral fusion. 4. Insertion of interbody device x1 (Globus expandable intervertebral spacer). 5. Placement of segmental posterior instrumentation L4, L5 bilaterally  6. Use of local autograft. 7. Use of morselized allograft -DBX mix 8. Intraoperative use of fluoroscopy.   SURGEON:  Estill Bamberg, MD.   ASSISTANT:  Skip Mayer, PA-C.   ANESTHESIA:  General endotracheal anesthesia.   COMPLICATIONS:  None.   DISPOSITION:  Stable.   ESTIMATED BLOOD LOSS:  100cc   INDICATIONS FOR SURGERY:  Briefly,  Steven [name] is a pleasant 77 -year-old male who did present to me with right leg weakness and severe and ongoing pain in the right leg. I did feel that the symptoms were secondary to the findings noted above.   The patient failed conservative care and did wish to proceed with the procedure  noted above.   OPERATIVE DETAILS:  On 04/12/2023, the patient was brought to surgery and general endotracheal anesthesia was administered.  The patient was placed prone on a well-padded flat Jackson bed with a spinal frame.  Antibiotics were given and a time-out procedure was performed. The back was prepped and draped in the usual fashion.  A midline incision was made overlying the L4-5  intervertebral spaces.  The fascia was incised at the midline.  The paraspinal musculature was bluntly swept laterally.  Anatomic landmarks for the pedicles were exposed. Using fluoroscopy, I did cannulate the L4 and L5 pedicles bilaterally, using a medial to lateral cortical trajectory technique.  At this point, 6 x 30 mm screws were placed into the left pedicles, and a 40 mm rod was placed into the tulip heads of the screw, and caps were also placed.  Distraction was then applied across the L4-5 intervertebral space, and the caps were then provisionally tightened.  On the right side, bone wax was placed into the cannulated pedicle holes.  I then proceeded with the decompressive aspect of the procedure at the L4-5 level.  A partial facetectomy was performed on the left at L4-5.  On the right, a full facetectomy was performed.  The traversing right L5 nerve was identified and gently medially retracted.  Herniated disc fragments were noted in the lateral recess, migrated inferiorly behind the L4 vertebral body.  These were uneventfully removed.  The exiting right L4 nerve was also noted to be compressed.  There were additional fragments identified in the foraminal and extraforaminal region, as noted on the patient's preoperative MRI.  These were removed uneventfully, thereby entirely decompressing the right L4 and L5 nerves. With an assistant holding medial retraction of the traversing right L5 nerve, I did perform an annulotomy at the posterolateral aspect of the L4-5 intervertebral space.  I then used a series of curettes and pituitary rongeurs to perform a  thorough and complete intervertebral diskectomy.  The intervertebral space was then liberally packed with autograft as well as allograft in the form of DBX mix, as was the appropriate-sized intervertebral spacer.  The spacer was then tamped into position in the usual fashion, and expanded to 11.5 mm in height. I was very pleased with the press-fit of  the spacer.  I then placed 6 mm screws on the right at L4 and L5. A 40-mm rod was then placed and caps were placed. The distraction was then released on the contralateral side.  All caps were then locked.  The wound was copiously irrigated with a total of approximately 3 L prior to placing the bone graft.  Additional autograft and allograft was then packed into the posterolateral gutter on the left side to help aid in the success of the fusion.  The wound was  explored for any undue bleeding and there was no substantial bleeding encountered.  Gel-Foam was placed over the laminectomy site.  The wound was then closed in layers using #1 Vicryl followed by 2-0 Vicryl, followed by 4-0 Monocryl.  Benzoin and Steri-Strips were applied followed by sterile dressing.     Of note, Skip Mayer, PA-C, was my assistant throughout surgery, and did aid in retraction, suctioning, the decompression, placement of the hardware, and closure.       Estill Bamberg, MD

## 2023-04-12 NOTE — H&P (Signed)
PREOPERATIVE H&P  Chief Complaint: Right leg pain  HPI: Steven [name] is a 77 y.o. 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  Promus LAD stent placed in [place].  100% RCA   Diabetes 1.5, managed as type 1 ([name])    Diverticulosis    on CT   Dyslipidemia    GERD (gastroesophageal reflux disease)    has used Zantac in the past    Glaucoma    Hammer toes, bilateral    Hernia    Hyperlipidemia    Hypertension    Peripheral vascular disease ([name])    a. s/p R fem-pop BPG 01/2013 (Dr. 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   Past Surgical History:  Procedure Laterality Date   ABDOMINAL AORTAGRAM N/A 12/10/2012   Procedure: ABDOMINAL Ronny Flurry;  Surgeon: Nada Libman, MD;  Location: [facility] CATH LAB;  Service: Cardiovascular;  Laterality: N/A;   CARDIAC CATHETERIZATION  12/05/2010   with stent placement   FEMORAL-POPLITEAL BYPASS GRAFT Right 02/07/2013   Procedure: BYPASS GRAFT FEMORAL-POPLITEAL ARTERY- RIGHT;  Surgeon: Pryor Ochoa, MD;  Location: [facility] OR;  Service: Vascular;  Laterality: Right;  Right femoral to below knee popliteal bypass using right non reversed great saphenous vein   INGUINAL HERNIA REPAIR  2010   Right   INGUINAL HERNIA REPAIR  09/11/2018   INTRAOPERATIVE ARTERIOGRAM  Right 02/07/2013   Procedure: Attempted INTRA OPERATIVE ARTERIOGRAM;  Surgeon: Pryor Ochoa, MD;  Location: [facility] OR;  Service: Vascular;  Laterality: Right;   IR RETROGRADE PYELOGRAM  12/10/2019   TRANSURETHRAL RESECTION OF PROSTATE  12/10/2019   Social History   Socioeconomic History   Marital status: Married    Spouse name: Not on file   Number of children: 5   Years of education: Not on file   Highest education level: Not on file  Occupational History   Occupation: retired, works part time, Hospital doctor   Occupation: [employer] [place]  Tobacco Use   Smoking status: Former    Current packs/day: 0.00    Average packs/day: 0.3 packs/day for 50.0 years (15.0 ttl pk-yrs)    Types: Cigarettes    Start date: 02/03/1961    Quit date: 02/04/2011    Years since quitting: 12.1    Passive exposure: Never   Smokeless tobacco: Never  Vaping Use   Vaping status: Never Used  Substance and Sexual Activity   Alcohol use: Not Currently   Drug use: No   Sexual activity: Not on file  Other Topics Concern   Not on file  Social History Narrative   5 children , lost a son    Lives with wife.     Social Determinants of Health   Financial  Resource Strain: Low Risk  (06/20/2021)   Overall Financial Resource Strain (CARDIA)    Difficulty of Paying Living Expenses: Not hard at all  Food Insecurity: No Food Insecurity (06/27/2022)   Hunger Vital Sign    Worried About Running Out of Food in the Last Year: Never true    Ran Out of Food in the Last Year: Never true  Transportation Needs: No Transportation Needs (06/27/2022)   PRAPARE - Administrator, Civil Service (Medical): No    Lack of Transportation (Non-Medical): No  Physical Activity: Inactive (06/20/2021)   Exercise Vital Sign    Days of Exercise per Week: 0 days    Minutes of Exercise per Session: 0 min  Stress: No Stress Concern Present (06/20/2021)   [employer] of Occupational Health - Occupational Stress  Questionnaire    Feeling of Stress : Not at all  Social Connections: Unknown (11/29/2021)   Received from [employer], [facility]   Social Network    Social Network: Not on file   Family History  Problem Relation Age of Onset   Diabetes Mother    Hypertension Mother    Heart disease Mother    Hyperlipidemia Mother    Diabetes Father    Heart disease Father    Hyperlipidemia Father    Hypertension Father    Diabetes Sister    Heart disease Sister    Hyperlipidemia Sister    Hypertension Sister    Diabetes Brother    Heart disease Brother    Hyperlipidemia Brother    Hypertension Brother    Diabetes Son    Heart disease Son    Hyperlipidemia Son    Hypertension Son    Pancreatic cancer Son    Alcohol abuse Neg Hx    Cancer Neg Hx    Depression Neg Hx    Early death Neg Hx    Kidney disease Neg Hx    Stroke Neg Hx    Colon cancer Neg Hx    Esophageal cancer Neg Hx    Rectal cancer Neg Hx    Stomach cancer Neg Hx    Liver cancer Neg Hx    No Known Allergies Prior to Admission medications   Medication Sig Start Date End Date Taking? Authorizing Provider  amLODipine (NORVASC) 10 MG tablet Take 1 tablet (10 mg total) by mouth daily. 12/27/22  Yes Wanda Plump, MD  aspirin EC 81 MG tablet Take 81 mg by mouth daily.   Yes [provider]  DULoxetine (CYMBALTA) 30 MG capsule Take 1 capsule (30 mg total) by mouth daily. 03/28/23  Yes   empagliflozin (JARDIANCE) 25 MG TABS tablet Take 1 tablet (25 mg total) by mouth daily before breakfast. 12/01/22  Yes Shamleffer, Konrad Dolores, MD  ezetimibe (ZETIA) 10 MG tablet Take 1 tablet (10 mg total) by mouth daily. 12/27/22  Yes Paz, Nolon Rod, MD  Fluocinolone Acetonide Scalp (DERMA-SMOOTHE/FS SCALP) 0.01 % OIL Apply 1 application topically to the scalp daily. Patient taking differently: Apply 1 application  topically daily as needed (scalp irritation). 02/26/23  Yes   fluticasone (FLONASE) 50 MCG/ACT nasal spray Place 2 sprays into  both nostrils daily. 06/20/22  Yes Sharlene Dory, DO  ipratropium (ATROVENT) 0.06 % nasal spray Place 1 spray into both nostrils 2 (two) times daily. Can also use as needed prior to meals. 02/01/23  Yes   latanoprost (XALATAN) 0.005 % ophthalmic solution Place 1 drop into both eyes at bedtime. 12/28/22  Yes   losartan (COZAAR) 100 MG tablet Take 1 tablet (100 mg total) by mouth daily. 03/01/23  Yes   metoprolol succinate (TOPROL-XL) 100 MG 24 hr tablet Take 1.5 tablets (150 mg total) by mouth daily. Take with or immediately following a meal. 12/27/22  Yes Paz, Nolon Rod, MD  repaglinide (PRANDIN) 0.5 MG tablet Take 1 tablet (0.5 mg total) by mouth daily before breakfast. 12/01/22  Yes Shamleffer, Konrad Dolores, MD  rosuvastatin (CRESTOR) 20 MG tablet Take 1 tablet (20 mg total) by mouth daily. 10/30/22  Yes Hochrein, Fayrene Fearing, MD  Semaglutide (RYBELSUS) 14 MG TABS Take 1 tablet (14 mg total) by mouth daily. 12/01/22  Yes Shamleffer, Konrad Dolores, MD  Continuous Glucose Sensor (FREESTYLE LIBRE 2 SENSOR) MISC INJECT 1 DEVICE INTO THE SKIN  EVERY 14 DAYS 12/14/22   Shamleffer, Konrad Dolores, MD  fluticasone-salmeterol (ADVAIR) 100-50 MCG/ACT AEPB Inhale 1 puff into the lungs 2 (two) times daily. 02/15/23   Wanda Plump, MD  Oxymetazoline HCl (NASAL RELIEF NA) Place 1 spray into the nose in the morning.    [provider]     All other systems have been reviewed and were otherwise negative with the exception of those mentioned in the HPI and as above.  Physical Exam: Vitals:   04/12/23 0548  BP: (!) 171/68  Pulse: 69  Resp: 17  Temp: 97.9 F (36.6 C)  SpO2: 98%    Body mass index is 21.63 kg/m.  General: Alert, no acute distress Cardiovascular: No pedal edema Respiratory: No cyanosis, no use of accessory musculature Skin: No lesions in the area of chief complaint Neurologic: Sensation intact distally Psychiatric: Patient is competent for consent with normal mood and  affect Lymphatic: No axillary or cervical lymphadenopathy   Assessment/Plan: LARGE DISC HERNIATION, L4/5 AFFECTING THE RIGHT LATERAL RECESS AND FORAMEN Plan for Procedure(s): RIGHT-SIDED LUMBAR 4- LUMBAR 5 TRANSFORAMINAL LUMBAR INTERBODY FUSION AND DECOMPRESSION WITH INSTRUMENTATION AND ALLOGRAFT   Jackelyn Hoehn, MD 04/12/2023 6:26 AM

## 2023-04-12 NOTE — Care Management CC44 (Signed)
Condition Code 44 Documentation Completed  Patient Details  Name: Steven [name] MRN: [id number] Date of Birth: [birth year]   Condition Code 44 given:  Yes Patient signature on Condition Code 44 notice:  Yes Documentation of 2 MD's agreement:  Yes Code 44 added to claim:  Yes    Michel Bickers, RN 04/12/2023, 5:39 PM

## 2023-04-12 NOTE — Plan of Care (Signed)
  Problem: Skin Integrity: Goal: Will show signs of wound healing Outcome: Completed/Met   Problem: Bladder/Genitourinary: Goal: Urinary functional status for postoperative course will improve Outcome: Completed/Met   Problem: Pain Management: Goal: Pain level will decrease Outcome: Completed/Met   Problem: Clinical Measurements: Goal: Ability to maintain clinical measurements within normal limits will improve Outcome: Completed/Met Goal: Postoperative complications will be avoided or minimized Outcome: Completed/Met Goal: Diagnostic test results will improve Outcome: Completed/Met   Problem: Activity: Goal: Ability to avoid complications of mobility impairment will improve Outcome: Completed/Met Goal: Ability to tolerate increased activity will improve Outcome: Completed/Met Goal: Will remain free from falls Outcome: Completed/Met   Problem: Bowel/Gastric: Goal: Gastrointestinal status for postoperative course will improve Outcome: Completed/Met

## 2023-04-12 NOTE — Transfer of Care (Signed)
Immediate Anesthesia Transfer of Care Note  Patient: Steven [name]  Procedure(s) Performed: RIGHT-SIDED LUMBAR 4- LUMBAR 5 TRANSFORAMINAL LUMBAR INTERBODY FUSION AND DECOMPRESSION WITH INSTRUMENTATION AND ALLOGRAFT (Right: Spine Lumbar)  Patient Location: PACU  Anesthesia Type:General  Level of Consciousness: drowsy  Airway & Oxygen Therapy: Patient Spontanous Breathing and Patient connected to face mask oxygen  Post-op Assessment: Report given to RN and Post -op Vital signs reviewed and stable  Post vital signs: Reviewed and stable  Last Vitals:  Vitals Value Taken Time  BP 138/65 04/12/23 1115  Temp 36.4 C 04/12/23 1113  Pulse 70 04/12/23 1118  Resp 11 04/12/23 1118  SpO2 98 % 04/12/23 1118  Vitals shown include unfiled device data.  Last Pain:  Vitals:   04/12/23 0602  TempSrc:   PainSc: 8          Complications: No notable events documented.

## 2023-04-12 NOTE — Care Management Obs Status (Signed)
MEDICARE OBSERVATION STATUS NOTIFICATION   Patient Details  Name: ADARION SPAZIANO MRN: [id number] Date of Birth: 20-Jul-1946   Medicare Observation Status Notification Given:  Waylan Boga, RN 04/12/2023, 5:39 PM

## 2023-04-12 NOTE — Anesthesia Procedure Notes (Addendum)
Procedure Name: Intubation Date/Time: 04/12/2023 7:55 AM  Performed by: Stanton Kidney, CRNAPre-anesthesia Checklist: Patient identified, Emergency Drugs available, Suction available and Patient being monitored Patient Re-evaluated:Patient Re-evaluated prior to induction Oxygen Delivery Method: Circle system utilized Preoxygenation: Pre-oxygenation with 100% oxygen Induction Type: IV induction Ventilation: Mask ventilation without difficulty and Oral airway inserted - appropriate to patient size Laryngoscope Size: McGraph and 4 Grade View: Grade I Tube type: Oral Number of attempts: 2 (first attempt with miller 2 by SRNA, able to get view then lost view, second attempt with mcgrath) Airway Equipment and Method: Stylet and Oral airway Placement Confirmation: ETT inserted through vocal cords under direct vision, positive ETCO2 and breath sounds checked- equal and bilateral Secured at: 23 cm Tube secured with: Tape Dental Injury: Teeth and Oropharynx as per pre-operative assessment  Comments: Intubation by [facility]

## 2023-04-12 NOTE — Anesthesia Postprocedure Evaluation (Signed)
Anesthesia Post Note  Patient: Steven [name]  Procedure(s) Performed: RIGHT-SIDED LUMBAR 4- LUMBAR 5 TRANSFORAMINAL LUMBAR INTERBODY FUSION AND DECOMPRESSION WITH INSTRUMENTATION AND ALLOGRAFT (Right: Spine Lumbar)     Patient location during evaluation: PACU Anesthesia Type: General Level of consciousness: awake and alert Pain management: pain level controlled Vital Signs Assessment: post-procedure vital signs reviewed and stable Respiratory status: spontaneous breathing, nonlabored ventilation, respiratory function stable and patient connected to nasal cannula oxygen Cardiovascular status: blood pressure returned to baseline and stable Postop Assessment: no apparent nausea or vomiting Anesthetic complications: no  No notable events documented.  Last Vitals:  Vitals:   04/12/23 1315 04/12/23 1338  BP: 109/62 (!) 141/70  Pulse: 78 77  Resp: 20 18  Temp:  36.8 C  SpO2: 95% 98%    Last Pain:  Vitals:   04/12/23 1450  TempSrc:   PainSc: 6                  [name]

## 2023-04-12 NOTE — Progress Notes (Signed)
CBG 148 at 0553

## 2023-04-13 ENCOUNTER — Encounter (HOSPITAL_COMMUNITY): Payer: Self-pay | Admitting: Orthopedic Surgery

## 2023-04-13 DIAGNOSIS — M5416 Radiculopathy, lumbar region: Secondary | ICD-10-CM | POA: Diagnosis not present

## 2023-04-13 LAB — GLUCOSE, CAPILLARY: Glucose-Capillary: 217 mg/dL — ABNORMAL HIGH (ref 70–99)

## 2023-04-13 MED FILL — Thrombin For Soln Kit 20000 Unit: CUTANEOUS | Qty: 1 | Status: AC

## 2023-04-13 NOTE — Progress Notes (Signed)
    Patient doing well  Has been ambulating   Physical Exam: Vitals:   04/13/23 0426 04/13/23 0714  BP: 117/63 (!) 141/62  Pulse: 65 73  Resp: 20 20  Temp: 98 F (36.7 C) 98.1 F (36.7 C)  SpO2: 95% 99%    Dressing in place NVI  POD #1 s/p L4/5 decompression and fusion  - up with PT/OT, encourage ambulation - Percocet for pain, Robaxin for muscle spasms - d/c home today with f/u in 2 weeks

## 2023-04-13 NOTE — Plan of Care (Signed)
Pt doing well. Pt and wife given D/C instructions with verbal understanding. Rx's were sent to the pharmacy by MD. Pt's incision is clean and dry with no sign of infection. Pt's IV was removed prior to D/C. Pt received RW from [employer] per MD order. Pt D/C'd home via wheelchair per MD order. Pt is stable @ D/C and has no other needs at this time. Rema Fendt, RN

## 2023-04-13 NOTE — Evaluation (Signed)
Occupational Therapy Evaluation Patient Details Name: Steven [name] MRN: [id number] DOB: 1946/01/04 Today's Date: 04/13/2023   History of Present Illness 77 yo M s/p L4/5 decompression and fusion.  PMH includes: a-fib, GERD, HTN   Clinical Impression   Patient admitted for the procedure above.  PTA he lives at home with his spouse and remains Mod I with ADL and mobility.  Patient is requesting a RW, and is close to his baseline.  No further OT needs in the acute setting, recommend follow up with MD as prescribed.        If plan is discharge home, recommend the following: Assist for transportation;Assistance with cooking/housework    Functional Status Assessment  Patient has had a recent decline in their functional status and demonstrates the ability to make significant improvements in function in a reasonable and predictable amount of time.  Equipment Recommendations  None recommended by OT    Recommendations for Other Services       Precautions / Restrictions Precautions Precautions: Back Precaution Booklet Issued: Yes (comment) Required Braces or Orthoses: Spinal Brace Spinal Brace: Thoracolumbosacral orthotic;Applied in sitting position;Applied in standing position Restrictions Weight Bearing Restrictions: No      Mobility Bed Mobility Overal bed mobility: Needs Assistance Bed Mobility: Sidelying to Sit, Sit to Sidelying   Sidelying to sit: Supervision     Sit to sidelying: Supervision      Transfers Overall transfer level: Modified independent Equipment used: Rolling walker (2 wheels)                      Balance Overall balance assessment: Needs assistance Sitting-balance support: Feet supported Sitting balance-Leahy Scale: Good     Standing balance support: Reliant on assistive device for balance Standing balance-Leahy Scale: Good                             ADL either performed or assessed with clinical judgement   ADL  Overall ADL's : At baseline                                       General ADL Comments: Min A with L sock     Vision Patient Visual Report: No change from baseline       Perception Perception: Within Functional Limits       Praxis Praxis: WFL       Pertinent Vitals/Pain Pain Assessment Pain Assessment: Faces Faces Pain Scale: Hurts little more Pain Location: Incisional Pain Descriptors / Indicators: Sore Pain Intervention(s): Monitored during session     Extremity/Trunk Assessment Upper Extremity Assessment Upper Extremity Assessment: Overall WFL for tasks assessed   Lower Extremity Assessment Lower Extremity Assessment: Overall WFL for tasks assessed   Cervical / Trunk Assessment Cervical / Trunk Assessment: Back Surgery   Communication     Cognition Arousal: Alert Behavior During Therapy: WFL for tasks assessed/performed Overall Cognitive Status: Within Functional Limits for tasks assessed                                       General Comments       Exercises     Shoulder Instructions      Home Living Family/patient expects to be discharged to:: Private residence Living Arrangements: Spouse/significant other  Available Help at Discharge: Family;Available 24 hours/day Type of Home: House Home Access: Stairs to enter [employer] of Steps: 2 Entrance Stairs-Rails: None Home Layout: One level     Bathroom Shower/Tub: Tub/shower unit;Walk-in shower   Bathroom Toilet: Standard Bathroom Accessibility: Yes How Accessible: Accessible via walker Home Equipment: None          Prior Functioning/Environment Prior Level of Function : Independent/Modified Independent;Driving                        OT Problem List: Pain      OT Treatment/Interventions:      OT Goals(Current goals can be found in the care plan section) Acute Rehab OT Goals Patient Stated Goal: Return home OT Goal Formulation: With  patient Time For Goal Achievement: 04/16/23 Potential to Achieve Goals: Good  OT Frequency:      Co-evaluation              AM-PAC OT "6 Clicks" Daily Activity     Outcome Measure Help from another person eating meals?: None Help from another person taking care of personal grooming?: None Help from another person toileting, which includes using toliet, bedpan, or urinal?: None Help from another person bathing (including washing, rinsing, drying)?: A Little Help from another person to put on and taking off regular upper body clothing?: None Help from another person to put on and taking off regular lower body clothing?: A Little 6 Click Score: 22   End of Session Equipment Utilized During Treatment: Rolling walker (2 wheels) Nurse Communication: Mobility status  Activity Tolerance: Patient tolerated treatment well Patient left: in bed  OT Visit Diagnosis: Pain                Time: 8416-6063 OT Time Calculation (min): 37 min Charges:  OT General Charges $OT Visit: 1 Visit OT Evaluation $OT Eval Moderate Complexity: 1 Mod OT Treatments $Self Care/Home Management : 8-22 mins  04/13/2023  RP, OTR/L  Acute Rehabilitation Services  Office:  [phone]   Suzanna Obey 04/13/2023, 8:57 AM

## 2023-04-13 NOTE — Evaluation (Signed)
Physical Therapy Evaluation  Patient Details Name: Steven [name] MRN: [id number] DOB: May 20, 1946 Today's Date: 04/13/2023  History of Present Illness  Pt is a 77 y/o M who presents s/p L4/5 decompression and fusion. PMH significant for CAD, DM, glaucoma, B hammer toes, a-fib, HTN, PVD, Sickle cell trait, BPH, fem-pop R 2014.   Clinical Impression  Pt admitted with above diagnosis. At the time of PT eval, pt was able to demonstrate transfers and ambulation with gross modified independence to supervision for safety and RW for support. Pt and wife provided extensive education on precautions, brace application/wearing schedule, appropriate activity progression, and car transfer. Brace adjusted for optimal fit. Pt currently with functional limitations due to the deficits listed below (see PT Problem List). Pt will benefit from skilled PT to increase their independence and safety with mobility to allow discharge to the venue listed below.          If plan is discharge home, recommend the following: A little help with walking and/or transfers;A little help with bathing/dressing/bathroom;Assistance with cooking/housework;Assist for transportation;Help with stairs or ramp for entrance   Can travel by private vehicle        Equipment Recommendations Rolling walker (2 wheels)  Recommendations for Other Services       Functional Status Assessment Patient has had a recent decline in their functional status and demonstrates the ability to make significant improvements in function in a reasonable and predictable amount of time.     Precautions / Restrictions Precautions Precautions: Back Precaution Booklet Issued: Yes (comment) Precaution Comments: Reviewed handout and pt was cued for precautions during functional mobility. Required Braces or Orthoses: Spinal Brace Spinal Brace: Thoracolumbosacral orthotic;Applied in sitting position Restrictions Weight Bearing Restrictions: No       Mobility  Bed Mobility Overal bed mobility: Modified Independent Bed Mobility: Sidelying to Sit, Sit to Sidelying, Rolling           General bed mobility comments: VC's for optimal log roll technique. HOB flat and rails lowered to simulate home environment.    Transfers Overall transfer level: Needs assistance Equipment used: Rolling walker (2 wheels) Transfers: Sit to/from Stand Sit to Stand: Supervision           General transfer comment: VC's for hand placement on seated surface for safety and wide BOS for increased control.    Ambulation/Gait Ambulation/Gait assistance: Supervision Gait Distance (Feet): 500 Feet Assistive device: Rolling walker (2 wheels) Gait Pattern/deviations: Step-through pattern, Decreased stride length, Trunk flexed Gait velocity: Decreased Gait velocity interpretation: <1.31 ft/sec, indicative of household ambulator   General Gait Details: VC's for improved posture, closer walker proximity and forward gaze. Able to improved gait pattern and heel strike with cues.  Stairs            Wheelchair Mobility     Tilt Bed    Modified Rankin (Stroke Patients Only)       Balance Overall balance assessment: Needs assistance Sitting-balance support: Feet supported Sitting balance-Leahy Scale: Fair     Standing balance support: Reliant on assistive device for balance Standing balance-Leahy Scale: Poor                               Pertinent Vitals/Pain Pain Assessment Pain Assessment: Faces Faces Pain Scale: Hurts little more Pain Location: Incisional Pain Descriptors / Indicators: Sore, Operative site guarding Pain Intervention(s): Limited activity within patient's tolerance, Monitored during session, Repositioned    Home Living  Family/patient expects to be discharged to:: Private residence Living Arrangements: Spouse/significant other Available Help at Discharge: Family;Available 24 hours/day Type of Home:  House Home Access: Stairs to enter Entrance Stairs-Rails: None Entrance Stairs-Number of Steps: 2   Home Layout: One level Home Equipment: None      Prior Function Prior Level of Function : Independent/Modified Independent;Driving                     Extremity/Trunk Assessment   Upper Extremity Assessment Upper Extremity Assessment: Defer to OT evaluation    Lower Extremity Assessment Lower Extremity Assessment: Generalized weakness;LLE deficits/detail LLE Deficits / Details: B weakness, however L>R. Pt with decreased heel strike on L able to be corrected with cues.    Cervical / Trunk Assessment Cervical / Trunk Assessment: Back Surgery  Communication   Communication Communication: No apparent difficulties  Cognition Arousal: Alert Behavior During Therapy: WFL for tasks assessed/performed Overall Cognitive Status: Within Functional Limits for tasks assessed                                          General Comments      Exercises     Assessment/Plan    PT Assessment Patient needs continued PT services  PT Problem List Decreased strength;Decreased activity tolerance;Decreased balance;Decreased mobility;Decreased knowledge of use of DME;Decreased safety awareness;Decreased knowledge of precautions;Pain       PT Treatment Interventions DME instruction;Gait training;Stair training;Functional mobility training;Therapeutic activities;Therapeutic exercise;Balance training;Patient/family education    PT Goals (Current goals can be found in the Care Plan section)  Acute Rehab PT Goals Patient Stated Goal: Home today PT Goal Formulation: With patient Time For Goal Achievement: 04/20/23 Potential to Achieve Goals: Good    Frequency Min 5X/week     Co-evaluation               AM-PAC PT "6 Clicks" Mobility  Outcome Measure Help needed turning from your back to your side while in a flat bed without using bedrails?: None Help needed  moving from lying on your back to sitting on the side of a flat bed without using bedrails?: None Help needed moving to and from a bed to a chair (including a wheelchair)?: A Little Help needed standing up from a chair using your arms (e.g., wheelchair or bedside chair)?: A Little Help needed to walk in hospital room?: A Little Help needed climbing 3-5 steps with a railing? : A Little 6 Click Score: 20    End of Session Equipment Utilized During Treatment: Gait belt;Back brace Activity Tolerance: Patient tolerated treatment well Patient left: in bed;with call bell/phone within reach;with family/visitor present Nurse Communication: Mobility status PT Visit Diagnosis: Unsteadiness on feet (R26.81);Pain Pain - part of body:  (back)    Time: 1610-9604 PT Time Calculation (min) (ACUTE ONLY): 28 min   Charges:   PT Evaluation $PT Eval Low Complexity: 1 Low PT Treatments $Gait Training: 8-22 mins PT General Charges $$ ACUTE PT VISIT: 1 Visit         Conni Slipper, PT, DPT Acute Rehabilitation Services Secure Chat Preferred Office: [phone]   Marylynn Pearson 04/13/2023, 10:53 AM

## 2023-04-25 ENCOUNTER — Other Ambulatory Visit (HOSPITAL_BASED_OUTPATIENT_CLINIC_OR_DEPARTMENT_OTHER): Payer: Self-pay

## 2023-04-25 NOTE — Discharge Summary (Signed)
Patient ID: Steven [name] MRN: [id number] DOB/AGE: [birth year] 77 y.o.  Admit date: 04/12/2023 Discharge date: 04/13/2023  Admission Diagnoses:  Principal Problem:   Lumbar radiculopathy   Discharge Diagnoses:  Same  Past Medical History:  Diagnosis Date   Allergy    Arthritis    Right Hip   Atrial fibrillation ([name])    BCC (basal cell carcinoma of skin)    BPH (benign prostatic hyperplasia)    COPD (chronic obstructive pulmonary disease) ([name])    Coronary artery disease    Lexiscan Myoview 7/14:  Inf scar with minimal reversibility, inf HK, EF 45%;  Low risk.  Promus LAD stent placed in [place].  100% RCA   Diabetes 1.5, managed as type 1 ([name])    Diverticulosis    on CT   Dyslipidemia    GERD (gastroesophageal reflux disease)    has used Zantac in the past    Glaucoma    Hammer toes, bilateral    Hernia    Hyperlipidemia    Hypertension    Peripheral vascular disease ([name])    a. s/p R fem-pop [facility] 01/2013 (Dr. 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 male who was admitted 04/12/2023 for operative treatment of Lumbar radiculopathy. Patient has severe unremitting pain that affects sleep, daily activities, and work/hobbies. After pre-op clearance the patient was taken to the operating room on 04/12/2023 and underwent  Procedure(s): RIGHT-SIDED LUMBAR 4- LUMBAR 5 TRANSFORAMINAL LUMBAR INTERBODY FUSION AND DECOMPRESSION WITH INSTRUMENTATION AND ALLOGRAFT.    Patient was given perioperative antibiotics:  Anti-infectives (From admission, onward)    Start     Dose/Rate Route Frequency Ordered Stop   04/12/23 1600  ceFAZolin (ANCEF) IVPB 1 g/50 mL premix         1 g 100 mL/hr over 30 Minutes Intravenous Every 8 hours 04/12/23 1332 04/12/23 2245   04/12/23 0600  ceFAZolin (ANCEF) IVPB 2g/100 mL premix        2 g 200 mL/hr over 30 Minutes Intravenous On call to O.R. 04/12/23 0540 04/12/23 0801        Patient was given sequential compression devices, early ambulation to prevent DVT.  Patient benefited maximally from hospital stay and there were no complications.    Recent vital signs: BP (!) 141/62 (BP Location: Right Arm)   Pulse 73   Temp 98.1 F (36.7 C) (Oral)   Resp 20   Ht 5\' 5"  (1.651 m)   Wt 59 kg   SpO2 99%   BMI 21.63 kg/m     Discharge Medications:   Allergies as of 04/13/2023   No Known Allergies      Medication List     STOP taking these medications    aspirin EC 81 MG tablet       TAKE these medications    amLODipine 10 MG tablet Commonly known as: NORVASC Take 1 tablet (10 mg total) by mouth daily.   DULoxetine 30 MG capsule Commonly known as: Cymbalta Take 1 capsule (30 mg total) by mouth daily.   ezetimibe 10 MG tablet Commonly known as: ZETIA Take 1 tablet (10 mg total) by mouth daily.   Fluocinolone Acetonide Scalp 0.01 % Oil Commonly  known as: Derma-Smoothe/FS Scalp Apply 1 application topically to the scalp daily. What changed:  when to take this reasons to take this   fluticasone 50 MCG/ACT nasal spray Commonly known as: FLONASE Place 2 sprays into both nostrils daily.   fluticasone-salmeterol 100-50 MCG/ACT Aepb Commonly known as: ADVAIR Inhale 1 puff into the lungs 2 (two) times daily.   FreeStyle Libre 2 Sensor Misc INJECT 1 DEVICE INTO THE SKIN  EVERY 14 DAYS   ipratropium 0.06 % nasal spray Commonly known as: ATROVENT Place 1 spray into both nostrils 2 (two) times daily. Can also use as needed prior to meals.   Jardiance 25 MG Tabs tablet Generic drug: empagliflozin Take 1 tablet (25 mg total) by mouth daily before breakfast.   latanoprost 0.005 % ophthalmic  solution Commonly known as: XALATAN Place 1 drop into both eyes at bedtime.   losartan 100 MG tablet Commonly known as: COZAAR Take 1 tablet (100 mg total) by mouth daily.   methocarbamol 500 MG tablet Commonly known as: ROBAXIN Take 1 tablet (500 mg total) by mouth every 6 (six) hours as needed for muscle spasms.   metoprolol succinate 100 MG 24 hr tablet Commonly known as: TOPROL-XL Take 1.5 tablets (150 mg total) by mouth daily. Take with or immediately following a meal.   NASAL RELIEF NA Place 1 spray into the nose in the morning.   oxyCODONE-acetaminophen 5-325 MG tablet Commonly known as: PERCOCET/ROXICET Take 1-2 tablets by mouth every 4 (four) hours as needed for moderate pain or severe pain.   repaglinide 0.5 MG tablet Commonly known as: PRANDIN Take 1 tablet (0.5 mg total) by mouth daily before breakfast.   rosuvastatin 20 MG tablet Commonly known as: CRESTOR Take 1 tablet (20 mg total) by mouth daily.   Rybelsus 14 MG Tabs Generic drug: Semaglutide Take 1 tablet (14 mg total) by mouth daily.        Diagnostic Studies: DG Lumbar Spine 1 View  Result Date: 04/12/2023 CLINICAL DATA:  L4-5 fusion. EXAM: LUMBAR SPINE - 1 VIEW COMPARISON:  Abdomen and pelvis CT dated 06/09/2020 FINDINGS: The previous CT demonstrated 5 non-rib-bearing lumbar vertebrae with the last open disc space at the L5-S1 level. A single portable cross-table lateral view of the lumbosacral spine demonstrates a metallic localizer with its tip projected posterior to the L4 spinous process and a metallic localizer with its tip projected at the inferior aspect of the space between the L5 and S1 spinous processes. Normal vertebral alignment. Mild anterior spur formation at multiple levels. Dense arterial calcifications without visible aneurysm. IMPRESSION: Metallic localizers, as described above. Electronically Signed   By: Beckie Salts M.D.   On: 04/12/2023 14:44   DG Lumbar Spine 2-3 Views  Result  Date: 04/12/2023 CLINICAL DATA:  L4-5 fusion. EXAM: LUMBAR SPINE - 2-3 VIEW COMPARISON:  Earlier today. FINDINGS: PA and lateral C-arm images of the mid and lower lumbar spine demonstrate interval interbody and pedicle screw and rod fusion at the L4-5 level with normal alignment. IMPRESSION: Interval L4-5 fusion with normal alignment. Electronically Signed   By: Beckie Salts M.D.   On: 04/12/2023 14:42   DG C-Arm 1-60 Min-No Report  Result Date: 04/12/2023 Fluoroscopy was utilized by the requesting physician.  No radiographic interpretation.   DG C-Arm 1-60 Min-No Report  Result Date: 04/12/2023 Fluoroscopy was utilized by the requesting physician.  No radiographic interpretation.   DG C-Arm 1-60 Min-No Report  Result Date: 04/12/2023 Fluoroscopy was utilized by the requesting physician.  No radiographic interpretation.    Disposition: Discharge disposition: 01-Home or Self Care        POD #1 s/p L4/5 decompression and fusion   - up with PT/OT, encourage ambulation - Percocet for pain, Robaxin for muscle spasms -Scripts for pain sent to pharmacy electronically  -D/C instructions sheet printed and in chart -D/C today  -F/U in office 2 weeks   Signed: Eilene Ghazi Daishaun Ayre 04/25/2023, 3:00 PM

## 2023-04-26 ENCOUNTER — Other Ambulatory Visit (HOSPITAL_COMMUNITY): Payer: Self-pay

## 2023-04-26 ENCOUNTER — Encounter: Payer: Self-pay | Admitting: Internal Medicine

## 2023-04-26 ENCOUNTER — Other Ambulatory Visit (HOSPITAL_BASED_OUTPATIENT_CLINIC_OR_DEPARTMENT_OTHER): Payer: Self-pay

## 2023-04-26 NOTE — Telephone Encounter (Signed)
error 

## 2023-04-27 ENCOUNTER — Other Ambulatory Visit (HOSPITAL_BASED_OUTPATIENT_CLINIC_OR_DEPARTMENT_OTHER): Payer: Self-pay

## 2023-04-27 MED ORDER — METHOCARBAMOL 500 MG PO TABS
500.0000 mg | ORAL_TABLET | Freq: Four times a day (QID) | ORAL | 2 refills | Status: DC | PRN
  Filled 2023-04-27: qty 60, 8d supply, fill #0
  Filled 2023-06-04: qty 60, 8d supply, fill #1
  Filled 2023-08-06 (×2): qty 60, 8d supply, fill #2

## 2023-04-27 MED ORDER — HYDROCODONE-ACETAMINOPHEN 5-325 MG PO TABS
1.0000 | ORAL_TABLET | Freq: Four times a day (QID) | ORAL | 0 refills | Status: DC | PRN
  Filled 2023-04-27: qty 60, 15d supply, fill #0

## 2023-05-01 ENCOUNTER — Other Ambulatory Visit (HOSPITAL_BASED_OUTPATIENT_CLINIC_OR_DEPARTMENT_OTHER): Payer: Self-pay

## 2023-05-07 ENCOUNTER — Other Ambulatory Visit: Payer: Self-pay | Admitting: Internal Medicine

## 2023-05-07 ENCOUNTER — Other Ambulatory Visit (HOSPITAL_COMMUNITY): Payer: Self-pay

## 2023-05-07 ENCOUNTER — Other Ambulatory Visit (HOSPITAL_BASED_OUTPATIENT_CLINIC_OR_DEPARTMENT_OTHER): Payer: Self-pay

## 2023-05-07 MED ORDER — FREESTYLE LIBRE 2 SENSOR MISC
1 refills | Status: DC
  Filled 2023-05-07: qty 6, 84d supply, fill #0

## 2023-05-08 ENCOUNTER — Other Ambulatory Visit (HOSPITAL_BASED_OUTPATIENT_CLINIC_OR_DEPARTMENT_OTHER): Payer: Self-pay

## 2023-05-09 ENCOUNTER — Other Ambulatory Visit (HOSPITAL_BASED_OUTPATIENT_CLINIC_OR_DEPARTMENT_OTHER): Payer: Self-pay

## 2023-05-10 ENCOUNTER — Encounter: Payer: Self-pay | Admitting: Emergency Medicine

## 2023-05-10 ENCOUNTER — Ambulatory Visit (INDEPENDENT_AMBULATORY_CARE_PROVIDER_SITE_OTHER): Payer: Medicare Other | Admitting: Emergency Medicine

## 2023-05-10 VITALS — BP 146/67 | HR 57 | Ht 65.0 in | Wt 129.8 lb

## 2023-05-10 DIAGNOSIS — R911 Solitary pulmonary nodule: Secondary | ICD-10-CM

## 2023-05-10 DIAGNOSIS — J449 Chronic obstructive pulmonary disease, unspecified: Secondary | ICD-10-CM

## 2023-05-10 NOTE — Patient Instructions (Signed)
VISIT SUMMARY:  Dear Steven [name], during your recent visit, we discussed the new pulmonary nodule found on your chest CT scan. Given your history of tobacco use, we are concerned this could be early lung cancer. We also discussed your chronic obstructive pulmonary disease (COPD), which seems to be stable with your current medication, Symbicort. Lastly, we touched on your recent spinal surgery and the ongoing recovery process.  YOUR PLAN:  -RIGHT UPPER LOBE PULMONARY NODULE: This is a new growth in your lung that was not present on previous scans. Given your history of smoking, we need to investigate further to rule out lung cancer. We will order a PET scan to get more information about this nodule.  We will check blood work today to see if you have been exposed to tuberculosis.  Depending on the results, we may need to perform a bronchoscopy, which is a procedure to take a small sample of the nodule for testing.  -COPD: Your COPD, a lung disease that makes it hard to breathe, seems to be stable with your current medication, Symbicort. We will continue with this treatment.  -RECENT SPINAL SURGERY: You are in the recovery phase following your recent spinal surgery. We will continue with the current post-operative care plan.  INSTRUCTIONS:  Please schedule a follow-up appointment to review the results of the PET scan and discuss the potential need for a bronchoscopy. Continue taking your Symbicort medication as prescribed, and follow your current post-operative care plan for your recent spinal surgery.

## 2023-05-10 NOTE — Assessment & Plan Note (Signed)
COPD Stable on Symbicort 2 puffs BID. History of bronchiectasis noted on prior imaging. -Continue Symbicort 2 puffs BID. -Consider adding LAMA at some point in the future. -He would likely need PFT going forward

## 2023-05-10 NOTE — Progress Notes (Signed)
Subjective:    Patient ID: Steven [name], male    DOB: [birth year], 77 y.o.   MRN: [id number]  HPI Mr. Gallia, a 77 year old with a history of tobacco use (45 pack years), COPD, atrial fibrillation, coronary disease, GERD, hypertension, and sickle cell trait, presents for evaluation of a pulmonary nodule. This nodule was identified on a recent CT scan of the chest, performed as part of routine follow-up for his chronic cough and history of bronchiectasis. The patient reports no new respiratory symptoms and states that his breathing has improved with the recent addition of Symbicort to his medication regimen.  The patient has a significant smoking history, having smoked approximately 17-18 cigarettes per day for around 50 years, although he quit approximately seven years ago. He has no known history of cancer or tuberculosis, but acknowledges potential exposure to tuberculosis during his time in [place].  He expresses concern about undergoing additional procedures under anesthesia in the near future.   RADIOLOGY CT chest: Coronary disease, no mediastinal or axillary lymphadenopathy, bi-basilar scarring, right lower lobe cylindrical bronchiectasis stable in appearance, increase in size of an irregular 1.7 x 1.3 cm right upper lobe pulmonary nodule (02/22/2023)  Review of Systems As per HPI  Past Medical History:  Diagnosis Date   Allergy    Arthritis    Right Hip   Atrial fibrillation ([name])    BCC (basal cell carcinoma of skin)    BPH (benign prostatic hyperplasia)    COPD (chronic obstructive pulmonary disease) ([name])    Coronary artery disease    Lexiscan Myoview 7/14:  Inf scar with minimal reversibility, inf HK, EF 45%;  Low risk.  Promus LAD stent placed in [place].  100% RCA   Diabetes 1.5, managed as type 1 ([name])    Diverticulosis    on CT   Dyslipidemia    GERD (gastroesophageal reflux disease)    has used Zantac in the past    Glaucoma    Hammer toes, bilateral     Hernia    Hyperlipidemia    Hypertension    Peripheral vascular disease ([name])    a. s/p R fem-pop BPG 01/2013 (Dr. 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     Family History  Problem Relation Age of Onset   Diabetes Mother    Hypertension Mother    Heart disease Mother    Hyperlipidemia Mother    Diabetes Father    Heart disease Father    Hyperlipidemia Father    Hypertension Father    Diabetes Sister    Heart disease Sister    Hyperlipidemia Sister    Hypertension Sister    Diabetes Brother    Heart disease Brother    Hyperlipidemia Brother    Hypertension Brother    Diabetes Son    Heart disease Son    Hyperlipidemia Son    Hypertension Son    Pancreatic cancer Son    Alcohol abuse Neg Hx    Cancer Neg Hx    Depression Neg Hx    Early death Neg Hx    Kidney disease Neg Hx    Stroke Neg Hx    Colon cancer Neg Hx    Esophageal cancer Neg Hx    Rectal cancer Neg Hx    Stomach cancer Neg Hx    Liver cancer Neg Hx      Social History   Socioeconomic History   Marital  status: Married    Spouse name: Not on file   Number of children: 5   Years of education: Not on file   Highest education level: Not on file  Occupational History   Occupation: retired, works part time, Hospital doctor   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     Social Determinants of Health   Financial Resource  Strain: Low Risk  (06/20/2021)   Overall Financial Resource Strain (CARDIA)    Difficulty of Paying Living Expenses: Not hard at all  Food Insecurity: No Food Insecurity (06/27/2022)   Hunger Vital Sign    Worried About Running Out of Food in the Last Year: Never true    Ran Out of Food in the Last Year: Never true  Transportation Needs: No Transportation Needs (06/27/2022)   PRAPARE - Administrator, Civil Service (Medical): No    Lack of Transportation (Non-Medical): No  Physical Activity: Inactive (06/20/2021)   Exercise Vital Sign    Days of Exercise per Week: 0 days    Minutes of Exercise per Session: 0 min  Stress: No Stress Concern Present (06/20/2021)   [employer] of Occupational Health - Occupational Stress Questionnaire    Feeling of Stress : Not at all  Social Connections: Unknown (11/29/2021)   Received from [facility], [facility]   Social Network    Social Network: Not on file  Intimate Partner Violence: Not At Risk (06/27/2022)   Humiliation, Afraid, Rape, and Kick questionnaire    Fear of Current or Ex-Partner: No    Emotionally Abused: No    Physically Abused: No    Sexually Abused: No    - Patient used to smoke about 17 to 18 cigarettes a day for about 50 years, quit about 7 years ago - Originally from [place] - Worked in Dentist and [employer]  No Known Allergies   Outpatient Medications Prior to Visit  Medication Sig Dispense Refill   amLODipine (NORVASC) 10 MG tablet Take 1 tablet (10 mg total) by mouth daily. 90 tablet 1   Continuous Glucose Sensor (FREESTYLE LIBRE 2 SENSOR) MISC Use as directed. Change every 14 days. 7 each 1   DULoxetine (CYMBALTA) 30 MG capsule Take 1 capsule (30 mg total) by mouth daily. 30 capsule 1   empagliflozin (JARDIANCE) 25 MG TABS tablet Take 1 tablet (25 mg total) by mouth daily before breakfast. 90 tablet 3   ezetimibe (ZETIA) 10 MG tablet Take 1 tablet (10 mg total) by  mouth daily. 90 tablet 1   Fluocinolone Acetonide Scalp (DERMA-SMOOTHE/FS SCALP) 0.01 % OIL Apply 1 application topically to the scalp daily. (Patient taking differently: Apply 1 application  topically daily as needed (scalp irritation).) 118.28 mL 11   fluticasone (FLONASE) 50 MCG/ACT nasal spray Place 2  sprays into both nostrils daily. 16 g 3   fluticasone-salmeterol (ADVAIR) 100-50 MCG/ACT AEPB Inhale 1 puff into the lungs 2 (two) times daily. 60 each 3   HYDROcodone-acetaminophen (NORCO/VICODIN) 5-325 MG tablet Take 1 tablet by mouth every 6 (six) to 8 (eight) hours as needed for pain 60 tablet 0   ipratropium (ATROVENT) 0.06 % nasal spray Place 1 spray into both nostrils 2 (two) times daily. Can also use as needed prior to meals. 15 mL 1   latanoprost (XALATAN) 0.005 % ophthalmic solution Place 1 drop into both eyes at bedtime. 2.5 mL 11   losartan (COZAAR) 100 MG tablet Take 1 tablet (100 mg total) by mouth daily. 90 tablet 1   methocarbamol (ROBAXIN) 500 MG tablet Take 1 tablet (500 mg total) by mouth every 6 (six) hours as needed for muscle spasms. 30 tablet 2   methocarbamol (ROBAXIN) 500 MG tablet Take 1-2 tablets (500-1,000 mg total) by mouth every 6 (six) to 8 (eight) hours as needed for spasms/muscle tension 60 tablet 2   metoprolol succinate (TOPROL-XL) 100 MG 24 hr tablet Take 1.5 tablets (150 mg total) by mouth daily. Take with or immediately following a meal. 135 tablet 1   oxyCODONE-acetaminophen (PERCOCET/ROXICET) 5-325 MG tablet Take 1-2 tablets by mouth every 4 (four) hours as needed for moderate pain or severe pain. 30 tablet 0   Oxymetazoline HCl (NASAL RELIEF NA) Place 1 spray into the nose in the morning.     repaglinide (PRANDIN) 0.5 MG tablet Take 1 tablet (0.5 mg total) by mouth daily before breakfast. 90 tablet 3   rosuvastatin (CRESTOR) 20 MG tablet Take 1 tablet (20 mg total) by mouth daily. 90 tablet 3   Semaglutide (RYBELSUS) 14 MG TABS Take 1 tablet (14 mg total) by  mouth daily. 90 tablet 3   No facility-administered medications prior to visit.        Objective:   Physical Exam Vitals:   05/10/23 1037  BP: (!) 146/67  Pulse: (!) 57  SpO2: 97%  Weight: 129 lb 12.8 oz (58.9 kg)  Height: 5\' 5"  (1.651 m)    Gen: Pleasant, well-nourished, in no distress,  normal affect  ENT: No lesions,  mouth clear,  oropharynx clear, no postnasal drip  Neck: No JVD, no stridor  Lungs: No use of accessory muscles, no crackles or wheezing on normal respiration, no wheeze on forced expiration  Cardiovascular: RRR, heart sounds normal, no murmur or gallops, no peripheral edema  Musculoskeletal: No deformities, no cyanosis or clubbing  Neuro: alert, awake, non focal  Skin: Warm, no lesions or rash       Assessment & Plan:  Pulmonary nodule 1 cm or greater in diameter Right Upper Lobe Pulmonary Nodule New 1.7 x 1.3 cm irregular nodule identified on CT chest from 02/22/2023. Not present on prior CT chest from 2016. Concern for possible early lung cancer given patient's history of tobacco use. -Order PET scan to further characterize the nodule. -Discussed potential for bronchoscopy for biopsy depending on PET scan results. Plan to review results with patient and make a decision regarding bronchoscopy.    Chronic obstructive pulmonary disease ([name]) COPD Stable on Symbicort 2 puffs BID. History of bronchiectasis noted on prior imaging. -Continue Symbicort 2 puffs BID. -Consider adding LAMA at some point in the future. -He would likely need PFT going forward   Levy Pupa, MD, PhD 05/10/2023, 12:58 PM [facility] [phone] or if no answer before 7:00PM call [phone] For  any issues after 7:00PM please call eLink [phone]

## 2023-05-10 NOTE — Assessment & Plan Note (Signed)
Right Upper Lobe Pulmonary Nodule New 1.7 x 1.3 cm irregular nodule identified on CT chest from 02/22/2023. Not present on prior CT chest from 2016. Concern for possible early lung cancer given patient's history of tobacco use. -Order PET scan to further characterize the nodule. -Discussed potential for bronchoscopy for biopsy depending on PET scan results. Plan to review results with patient and make a decision regarding bronchoscopy.

## 2023-05-12 LAB — QUANTIFERON-TB GOLD PLUS
Mitogen-NIL: 7.82 [IU]/mL
NIL: 0.04 [IU]/mL
QuantiFERON-TB Gold Plus: NEGATIVE
TB1-NIL: 0.14 [IU]/mL
TB2-NIL: 0.18 [IU]/mL

## 2023-05-15 ENCOUNTER — Other Ambulatory Visit (HOSPITAL_BASED_OUTPATIENT_CLINIC_OR_DEPARTMENT_OTHER): Payer: Self-pay

## 2023-05-15 MED ORDER — IBUPROFEN 600 MG PO TABS
600.0000 mg | ORAL_TABLET | Freq: Three times a day (TID) | ORAL | 0 refills | Status: DC | PRN
  Filled 2023-05-15: qty 30, 10d supply, fill #0

## 2023-05-16 ENCOUNTER — Encounter: Payer: Self-pay | Admitting: Internal Medicine

## 2023-05-16 ENCOUNTER — Ambulatory Visit: Payer: Medicare Other | Admitting: Internal Medicine

## 2023-05-16 VITALS — BP 126/60 | HR 55 | Temp 97.7°F | Resp 16 | Ht 65.0 in | Wt 127.4 lb

## 2023-05-16 DIAGNOSIS — I10 Essential (primary) hypertension: Secondary | ICD-10-CM

## 2023-05-16 DIAGNOSIS — N1831 Chronic kidney disease, stage 3a: Secondary | ICD-10-CM

## 2023-05-16 DIAGNOSIS — Z0001 Encounter for general adult medical examination with abnormal findings: Secondary | ICD-10-CM

## 2023-05-16 DIAGNOSIS — N1832 Chronic kidney disease, stage 3b: Secondary | ICD-10-CM

## 2023-05-16 DIAGNOSIS — Z23 Encounter for immunization: Secondary | ICD-10-CM | POA: Diagnosis not present

## 2023-05-16 DIAGNOSIS — E1122 Type 2 diabetes mellitus with diabetic chronic kidney disease: Secondary | ICD-10-CM | POA: Diagnosis not present

## 2023-05-16 DIAGNOSIS — J479 Bronchiectasis, uncomplicated: Secondary | ICD-10-CM

## 2023-05-16 DIAGNOSIS — Z0000 Encounter for general adult medical examination without abnormal findings: Secondary | ICD-10-CM

## 2023-05-16 DIAGNOSIS — Z794 Long term (current) use of insulin: Secondary | ICD-10-CM | POA: Diagnosis not present

## 2023-05-16 DIAGNOSIS — E785 Hyperlipidemia, unspecified: Secondary | ICD-10-CM | POA: Diagnosis not present

## 2023-05-16 LAB — CBC WITH DIFFERENTIAL/PLATELET
Basophils Absolute: 0 10*3/uL (ref 0.0–0.1)
Basophils Relative: 0.4 % (ref 0.0–3.0)
Eosinophils Absolute: 0.2 10*3/uL (ref 0.0–0.7)
Eosinophils Relative: 3.3 % (ref 0.0–5.0)
HCT: 46.2 % (ref 39.0–52.0)
Hemoglobin: 14.6 g/dL (ref 13.0–17.0)
Lymphocytes Relative: 25.3 % (ref 12.0–46.0)
Lymphs Abs: 1.8 10*3/uL (ref 0.7–4.0)
MCHC: 31.6 g/dL (ref 30.0–36.0)
MCV: 83.4 fL (ref 78.0–100.0)
Monocytes Absolute: 0.5 10*3/uL (ref 0.1–1.0)
Monocytes Relative: 7.7 % (ref 3.0–12.0)
Neutro Abs: 4.4 10*3/uL (ref 1.4–7.7)
Neutrophils Relative %: 63.3 % (ref 43.0–77.0)
Platelets: 147 10*3/uL — ABNORMAL LOW (ref 150.0–400.0)
RBC: 5.54 Mil/uL (ref 4.22–5.81)
RDW: 15.8 % — ABNORMAL HIGH (ref 11.5–15.5)
WBC: 7 10*3/uL (ref 4.0–10.5)

## 2023-05-16 LAB — BASIC METABOLIC PANEL
BUN: 20 mg/dL (ref 6–23)
CO2: 29 meq/L (ref 19–32)
Calcium: 9.8 mg/dL (ref 8.4–10.5)
Chloride: 104 meq/L (ref 96–112)
Creatinine, Ser: 1.55 mg/dL — ABNORMAL HIGH (ref 0.40–1.50)
GFR: 43.64 mL/min — ABNORMAL LOW (ref >60.00)
Glucose, Bld: 119 mg/dL — ABNORMAL HIGH (ref 70–99)
Potassium: 4.6 meq/L (ref 3.5–5.1)
Sodium: 142 meq/L (ref 135–145)

## 2023-05-16 LAB — MICROALBUMIN / CREATININE URINE RATIO
Creatinine,U: 53 mg/dL
Microalb Creat Ratio: 37.7 mg/g — ABNORMAL HIGH (ref 0.0–30.0)
Microalb, Ur: 20 mg/dL — ABNORMAL HIGH (ref 0.0–1.9)

## 2023-05-16 NOTE — Assessment & Plan Note (Signed)
Here for CPX   DM: Per Endo. HTN: BP looks good today, continue losartan, metoprolol. High cholesterol: Controlled on Crestor. CKD 3B: Saw nephrology 02/28/2023, significant proteinuria noted, creatinine was 1.5, renal ultrasound 03/14/2023 with no acute abnormalities. Had a dental extraction yesterday, Rx ibuprofen.  Recommend not to take ibuprofen.  Tylenol is okay. Check a BMP and CBC. Bronchiectasis : I ordered CT done on 02/28/2023   had a irregular R upper lobe nodule, saw pulmonary last week, PET scan pending. Pulmonary nodule: See above Lumbar surgery: S/p L4/5 surgery last month RTC 3-4 months.

## 2023-05-16 NOTE — Progress Notes (Signed)
Subjective:    Patient ID: Steven [name], male    DOB: 04-Sep-1945, 77 y.o.   MRN: [id number]  DOS:  05/16/2023 Type of visit - description: cpx  Here for CPX. Chart is reviewed. Chronic medical problems addressed.  Recently had back surgery.  No major pain. Denies fever or chills. No chest pain. No cough or hemoptysis.   Review of Systems  Other than above, a 14 point review of systems is negative     Past Medical History:  Diagnosis Date   Allergy    Arthritis    Right Hip   Atrial fibrillation ([name])    BCC (basal cell carcinoma of skin)    BPH (benign prostatic hyperplasia)    COPD (chronic obstructive pulmonary disease) ([name])    Coronary artery disease    Lexiscan Myoview 7/14:  Inf scar with minimal reversibility, inf HK, EF 45%;  Low risk.  Promus LAD stent placed in [place].  100% RCA   Diabetes 1.5, managed as type 1 ([name])    Diverticulosis    on CT   Dyslipidemia    GERD (gastroesophageal reflux disease)    has used Zantac in the past    Glaucoma    Hammer toes, bilateral    Hernia    Hyperlipidemia    Hypertension    Peripheral vascular disease ([name])    a. s/p R fem-pop BPG 01/2013 (Dr. 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 CATH LAB;  Service: Cardiovascular;  Laterality: N/A;   CARDIAC CATHETERIZATION  12/05/2010   with stent placement   FEMORAL-POPLITEAL BYPASS GRAFT Right 02/07/2013   Procedure: BYPASS GRAFT FEMORAL-POPLITEAL ARTERY- RIGHT;  Surgeon: Steven Ochoa, MD;  Location: [facility] OR;  Service: Vascular;  Laterality: Right;  Right femoral to below knee popliteal bypass using right non reversed great saphenous vein   INGUINAL HERNIA REPAIR  2010   Right   INGUINAL HERNIA REPAIR  09/11/2018   INTRAOPERATIVE ARTERIOGRAM Right  02/07/2013   Procedure: Attempted INTRA OPERATIVE ARTERIOGRAM;  Surgeon: Steven Ochoa, MD;  Location: [facility] OR;  Service: Vascular;  Laterality: Right;   IR RETROGRADE PYELOGRAM  12/10/2019   TRANSFORAMINAL LUMBAR INTERBODY FUSION (TLIF) WITH PEDICLE SCREW FIXATION 1 LEVEL Right 04/12/2023   Procedure: RIGHT-SIDED LUMBAR 4- LUMBAR 5 TRANSFORAMINAL LUMBAR INTERBODY FUSION AND DECOMPRESSION WITH INSTRUMENTATION AND ALLOGRAFT;  Surgeon: Steven Bamberg, MD;  Location: [facility] OR;  Service: Orthopedics;  Laterality: Right;   TRANSURETHRAL RESECTION OF PROSTATE  12/10/2019   Social History   Socioeconomic History   Marital status: Married    Spouse name: Not on file   Number of children: 5   Years of education: Not on file   Highest education level: Not on file  Occupational History   Occupation: retired, works part time, Hospital doctor   Occupation: Research scientist (life sciences) industry [place]  Tobacco Use   Smoking status: Former    Current packs/day: 0.00    Average packs/day: 0.3 packs/day for 50.0 years (15.0 ttl pk-yrs)    Types: Cigarettes    Start date: 02/03/1961    Quit date: 02/04/2011    Years since quitting: 12.2    Passive exposure: Never   Smokeless tobacco: Never  Vaping Use   Vaping status: Never Used  Substance and  Sexual Activity   Alcohol use: Not Currently   Drug use: No   Sexual activity: Not on file  Other Topics Concern   Not on file  Social History Narrative   5 children , lost a son    Lives with wife.     Social Determinants of Health   Financial Resource Strain: Low Risk  (06/20/2021)   Overall Financial Resource Strain (CARDIA)    Difficulty of Paying Living Expenses: Not hard at all  Food Insecurity: No Food Insecurity (06/27/2022)   Hunger Vital Sign    Worried About Running Out of Food in the Last Year: Never true    Ran Out of Food in the Last Year: Never true  Transportation Needs: No Transportation Needs (06/27/2022)   PRAPARE - Administrator, Civil Service  (Medical): No    Lack of Transportation (Non-Medical): No  Physical Activity: Inactive (06/20/2021)   Exercise Vital Sign    Days of Exercise per Week: 0 days    Minutes of Exercise per Session: 0 min  Stress: No Stress Concern Present (06/20/2021)   Steven [name] of Occupational Health - Occupational Stress Questionnaire    Feeling of Stress : Not at all  Social Connections: Unknown (11/29/2021)   Received from [facility], [facility]   Social Network    Social Network: Not on file  Intimate Partner Violence: Not At Risk (06/27/2022)   Humiliation, Afraid, Rape, and Kick questionnaire    Fear of Current or Ex-Partner: No    Emotionally Abused: No    Physically Abused: No    Sexually Abused: No     Current Outpatient Medications  Medication Instructions   amLODipine (NORVASC) 10 mg, Oral, Daily   Continuous Glucose Sensor (FREESTYLE LIBRE 2 SENSOR) MISC Use as directed. Change every 14 days.   DULoxetine (CYMBALTA) 30 mg, Oral, Daily   ezetimibe (ZETIA) 10 mg, Oral, Daily   Fluocinolone Acetonide Scalp (DERMA-SMOOTHE/FS SCALP) 0.01 % OIL Apply 1 application topically to the scalp daily.   fluticasone (FLONASE) 50 MCG/ACT nasal spray 2 sprays, Each Nare, Daily   fluticasone-salmeterol (ADVAIR) 100-50 MCG/ACT AEPB 1 puff, Inhalation, 2 times daily   HYDROcodone-acetaminophen (NORCO/VICODIN) 5-325 MG tablet Take 1 tablet by mouth every 6 (six) to 8 (eight) hours as needed for pain   ipratropium (ATROVENT) 0.06 % nasal spray 1 spray, Each Nare, 2 times daily, Can also use as needed prior to meals.   Jardiance 25 mg, Oral, Daily before breakfast   latanoprost (XALATAN) 0.005 % ophthalmic solution 1 drop, Both Eyes, Daily at bedtime   losartan (COZAAR) 100 mg, Oral, Daily   methocarbamol (ROBAXIN) 500 MG tablet Take 1-2 tablets (500-1,000 mg total) by mouth every 6 (six) to 8 (eight) hours as needed for spasms/muscle tension   metoprolol succinate (TOPROL-XL) 150 mg, Oral, Daily,  Take with or immediately following a meal.   oxyCODONE-acetaminophen (PERCOCET/ROXICET) 5-325 MG tablet 1-2 tablets, Oral, Every 4 hours PRN   Oxymetazoline HCl (NASAL RELIEF NA) 1 spray, Nasal, Every morning   repaglinide (PRANDIN) 0.5 mg, Oral, Daily before breakfast   rosuvastatin (CRESTOR) 20 mg, Oral, Daily   Rybelsus 14 mg, Oral, Daily       Objective:   Physical Exam BP 126/60   Pulse (!) 55   Temp 97.7 F (36.5 C) (Oral)   Resp 16   Ht 5\' 5"  (1.651 m)   Wt 127 lb 6 oz (57.8 kg)   SpO2 99%   BMI 21.20  kg/m  General:   Well developed, NAD, BMI noted. HEENT:  Normocephalic . Face symmetric, atraumatic Neck: No thyromegaly or LAD's Lungs:  Decreased breath sounds Normal respiratory effort, no intercostal retractions, no accessory muscle use. Heart: RRR,  no murmur.  Lower extremities: no pretibial edema bilaterally  Skin: Not pale. Not jaundice Neurologic:  alert & oriented X3.  Speech normal, gait appropriate for age and unassisted. Psych--  Cognition and judgment appear intact.  Cooperative with normal attention span and concentration.  Behavior appropriate. No anxious or depressed appearing.      Assessment     ASSESSMENT (new pt to me 12/2019) DM, first visit with Endo 09/07/2020 HTN Hyperlipidemia CKD: --Creatinine fluctuates 1.70- 1.40 --CT November 2021: Normal kidneys and bladder CV: --CAD, Dr. Antoine Poche --PAD, fem-pop  2014 Sickle cell anemia COPD - CT chest  08-2014: Bronchiectasis, centrilobular emphysema - Former smoker BPH, TURP and bilateral retrograde pyelograms 12/10/2019, Dr Jeralyn Bennett Vasomotor rhinitis: Saw ENT 01/2022  PLAN Here for CPX Tdap 2014 PNM 13 -- 2016;  PNM 23 -- 2014;  PNM 20: 2023 s/p RSV S/p zostavax, s/p shingrix   Flu shot today Recommend a COVID booster, Tdap CCS: Colonoscopy 2019, next 5 years, due for colonoscopy, will hold off, undergoing a workup for possible lung malignancy. Prostate cancer screening: Saw  urology recently per patient. Labs:  Available labs reviewed. Check BMP CBC POA d/w pt  DM: Per Endo. HTN: BP looks good today, continue losartan, metoprolol. High cholesterol: Controlled on Crestor. CKD 3B: Saw nephrology 02/28/2023, significant proteinuria noted, creatinine was 1.5, renal ultrasound 03/14/2023 with no acute abnormalities. Had a dental extraction yesterday, Rx ibuprofen.  Recommend not to take ibuprofen.  Tylenol is okay. Check a BMP and CBC. Bronchiectasis : I ordered CT done on 02/28/2023   had a irregular R upper lobe nodule, saw pulmonary last week, PET scan pending. Pulmonary nodule: See above Lumbar surgery: S/p L4/5 surgery last month RTC 3-4 months.

## 2023-05-16 NOTE — Patient Instructions (Addendum)
Vaccines I recommend: Covid booster Tdap (tetanus)   Check the  blood pressure regularly Blood pressure goal:  between 110/65 and  135/85. If it is consistently higher or lower, let me know     GO TO THE LAB : Get the blood work     Next visit with me in 3 to 4 months for a checkup please schedule it at the front desk       "Health Care Power of attorney" ,  "Living will" (Advance care planning documents)  If you already have a living will or healthcare power of attorney, is recommended you bring the copy to be scanned in your chart.   The document will be available to all the doctors you see in the system.  Advance care planning is a process that supports adults in  understanding and sharing their preferences regarding future medical care.  The patient's preferences are recorded in documents called Advance Directives and the can be modified at any time while the patient is in full mental capacity.   If you don't have one, please consider create one.      More information at: [email or web address]

## 2023-05-16 NOTE — Assessment & Plan Note (Signed)
Here for CPX Tdap 2014 PNM 13 -- 2016;  PNM 23 -- 2014;  PNM 20: 2023 s/p RSV S/p zostavax, s/p shingrix   Flu shot today Recommend a COVID booster, Tdap CCS: Colonoscopy 2019, next 5 years, due for colonoscopy, will hold off, undergoing a workup for possible lung malignancy. Prostate cancer screening: Saw urology recently per patient. Labs:  Available labs reviewed. Check BMP CBC POA d/w pt

## 2023-05-22 ENCOUNTER — Other Ambulatory Visit (HOSPITAL_BASED_OUTPATIENT_CLINIC_OR_DEPARTMENT_OTHER): Payer: Self-pay

## 2023-05-22 ENCOUNTER — Other Ambulatory Visit (HOSPITAL_COMMUNITY): Payer: Medicare Other

## 2023-05-22 ENCOUNTER — Other Ambulatory Visit: Payer: Self-pay

## 2023-05-23 ENCOUNTER — Other Ambulatory Visit (HOSPITAL_BASED_OUTPATIENT_CLINIC_OR_DEPARTMENT_OTHER): Payer: Self-pay

## 2023-05-23 ENCOUNTER — Other Ambulatory Visit: Payer: Self-pay

## 2023-06-01 ENCOUNTER — Encounter (HOSPITAL_COMMUNITY)
Admission: RE | Admit: 2023-06-01 | Discharge: 2023-06-01 | Disposition: A | Discharge status: Home / Self Care | Payer: Medicare Other | Source: Ambulatory Visit | Attending: Emergency Medicine | Admitting: Emergency Medicine

## 2023-06-01 DIAGNOSIS — R911 Solitary pulmonary nodule: Secondary | ICD-10-CM | POA: Insufficient documentation

## 2023-06-01 LAB — GLUCOSE, CAPILLARY: Glucose-Capillary: 159 mg/dL — ABNORMAL HIGH (ref 70–99)

## 2023-06-01 MED ORDER — FLUDEOXYGLUCOSE F - 18 (FDG) INJECTION
6.6000 | Freq: Once | INTRAVENOUS | Status: AC
  Administered 2023-06-01: 6.36 via INTRAVENOUS

## 2023-06-04 ENCOUNTER — Other Ambulatory Visit (HOSPITAL_BASED_OUTPATIENT_CLINIC_OR_DEPARTMENT_OTHER): Payer: Self-pay

## 2023-06-07 ENCOUNTER — Telehealth: Payer: Self-pay | Admitting: Internal Medicine

## 2023-06-07 ENCOUNTER — Encounter: Payer: Self-pay | Admitting: Emergency Medicine

## 2023-06-07 ENCOUNTER — Ambulatory Visit: Payer: Medicare Other | Admitting: Emergency Medicine

## 2023-06-07 VITALS — BP 149/85 | HR 61 | Temp 97.7°F | Ht 65.0 in | Wt 131.6 lb

## 2023-06-07 DIAGNOSIS — J449 Chronic obstructive pulmonary disease, unspecified: Secondary | ICD-10-CM

## 2023-06-07 DIAGNOSIS — R911 Solitary pulmonary nodule: Secondary | ICD-10-CM | POA: Diagnosis not present

## 2023-06-07 NOTE — Assessment & Plan Note (Signed)
Reviewed his PET scan today.  There is mild to moderate hypermetabolism associated with the slowly growing right upper lobe pulmonary nodule.  I did explain to him that I am very concerned that this is probably a non-small cell lung cancer.  I recommended bronchoscopy.  He understands but he wants to wait because he still recovering from his back surgery.  I asked him to please consider this and let me know if he changes his mind.  In the meantime I will arrange for a repeat CT chest in February to look for interval change.  Hopefully at that time he will be willing to discuss possible intervention.

## 2023-06-07 NOTE — Telephone Encounter (Signed)
Pt came in requesting to get lab work at 4 pm. Since the lab closes at 3:30 they were asked if they could see the pt. After looking at the pt's chart the lab informed  me that they could not find any orders in the pt's chart so the labs could not be done. When I informed the pt that no labs could be drawn since no orders could be found the patient immediately began yelling and hitting the glass at the front desk. The pt began stating that the I am a liar and couldn't be trusted because I am incompetent just like the ladies in the lab. I tried to ask where the orders were coming from but was constantly yelled over by the pt. The pt would not let me speak and repeatedly hit the glass and said "no" when I tried to talk. The pt then said that I had lied to him before when I informed him we couldn't do his cardiology labs and gave him a address for a lab that did. When I tried to explain why we did not do his cardio labs he once again hit the glass and said "No, you are a idiot, a liar, and incompetent". Followed shortly after by the pt asking to speak to my supervisor because" your incompetent". I informed the pt that I would go check and see if one was available but was still yelled over and told that the manager better not be incompetent like me.   Pt then demanded we open the door and let him in like everyone else.Pt then proceeded to stare through the glass window in the door and wait for someone to come out. I told the pt to sit down and then asked Fechter to watch and make sure he didn't come back when someone left.  After checking for a manager and realizing they were all in a meeting gone for the day, I informed the pt that my managers were in a meeting at the time but I could give him her business card so he could get in contact. The pt then began yelling again and demanded that I open the door to the lab because we were going to take his labs because we were taking other peoples labs. I told the pt no and  attempted to explain that we cannot take [facility] labs so we cannot take his blood today. Pt continued to yell and wave his hands for several minutes. I Informed the pt that we could not take his labs because they are [facility] from [facility]. Pt then brought up his cardio labs from months prior and I told him we don't do cardiologist labs either. I repeated this several time he said okay and then left.

## 2023-06-07 NOTE — Patient Instructions (Signed)
We reviewed your PET scan today. We discussed performing bronchoscopy to evaluate a right upper lobe pulmonary nodule. Please call our office if you decide you want to proceed with the bronchoscopy. We will plan to perform a repeat CT scan of your chest in February 2025 to compare with priors. Keep albuterol available to use 2 puffs if needed for shortness of breath, chest tightness, wheezing. We will hold off on restarting any scheduled inhaler medication at this time.  We may decide to do so in the future depending on how your breathing is doing Follow with Dr. Delton Coombes in February after your CT so we can review those results together

## 2023-06-07 NOTE — Assessment & Plan Note (Signed)
Not currently on bronchodilator therapy.  He was on Symbicort but stopped it and has not missed it.  Will keep albuterol available, hold off on scheduled BD for now

## 2023-06-07 NOTE — Progress Notes (Signed)
Subjective:    Patient ID: Steven [name], male    DOB: 1946-01-19, 77 y.o.   MRN: [id number]  HPI  ROV 06/07/2023 --77 year old male with a history of tobacco use and COPD with bronchiectasis, A-fib, CAD, GERD, hypertension and sickle cell trait.  He was under evaluation for chronic cough and a CT chest was done 02/22/2023 to evaluate given his bronchiectasis.  This showed bibasilar scar, right lower lobe cylindrical bronchiectasis and an increase in size 7 irregular 1.7 x 1.3 cm right upper lobe pulmonary nodule that was new from 2016.  He has been managed on Symbicort, but not for the last few months. He is not very active. He denies any dyspnea - more limited by back.   PET scan 06/01/2023 reviewed by me, shows  Review of Systems As per HPI  Past Medical History:  Diagnosis Date   Allergy    Arthritis    Right Hip   Atrial fibrillation ([name])    BCC (basal cell carcinoma of skin)    BPH (benign prostatic hyperplasia)    COPD (chronic obstructive pulmonary disease) ([name])    Coronary artery disease    Lexiscan Myoview 7/14:  Inf scar with minimal reversibility, inf HK, EF 45%;  Low risk.  Promus LAD stent placed in [place].  100% RCA   Diabetes 1.5, managed as type 1 ([name])    Diverticulosis    on CT   Dyslipidemia    GERD (gastroesophageal reflux disease)    has used Zantac in the past    Glaucoma    Hammer toes, bilateral    Hernia    Hyperlipidemia    Hypertension    Peripheral vascular disease ([name])    a. s/p R fem-pop BPG 01/2013 (Dr. 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     Family History  Problem Relation Age of Onset   Diabetes Mother    Hypertension Mother    Heart disease Mother    Hyperlipidemia Mother    Diabetes Father    Heart disease Father    Hyperlipidemia Father    Hypertension Father    Diabetes Sister    Heart disease Sister    Hyperlipidemia Sister    Hypertension Sister    Diabetes Brother     Heart disease Brother    Hyperlipidemia Brother    Hypertension Brother    Diabetes Son    Heart disease Son    Hyperlipidemia Son    Hypertension Son    Pancreatic cancer Son    Alcohol abuse Neg Hx    Cancer Neg Hx    Depression Neg Hx    Early death Neg Hx    Kidney disease Neg Hx    Stroke Neg Hx    Colon cancer Neg Hx    Esophageal cancer Neg Hx    Rectal cancer Neg Hx    Stomach cancer Neg Hx    Liver cancer Neg Hx      Social History   Socioeconomic History   Marital status: Married    Spouse name: Not on file   Number of children: 5   Years of education: Not on file   Highest education level: Not on file  Occupational History   Occupation: retired, works part time, Hospital doctor   Occupation: Research scientist (life sciences) industry [place]  Tobacco Use   Smoking status: Former    Current packs/day: 0.00    Average packs/day: 0.3  packs/day for 50.0 years (15.0 ttl pk-yrs)    Types: Cigarettes    Start date: 02/03/1961    Quit date: 02/04/2011    Years since quitting: 12.3    Passive exposure: Never   Smokeless tobacco: Never  Vaping Use   Vaping status: Never Used  Substance and Sexual Activity   Alcohol use: Not Currently   Drug use: No   Sexual activity: Not on file  Other Topics Concern   Not on file  Social History Narrative   5 children , lost a son    Lives with wife.     Social Determinants of Health   Financial Resource Strain: Low Risk  (06/20/2021)   Overall Financial Resource Strain (CARDIA)    Difficulty of Paying Living Expenses: Not hard at all  Food Insecurity: No Food Insecurity (06/27/2022)   Hunger Vital Sign    Worried About Running Out of Food in the Last Year: Never true    Ran Out of Food in the Last Year: Never true  Transportation Needs: No Transportation Needs (06/27/2022)   PRAPARE - Administrator, Civil Service (Medical): No    Lack of Transportation (Non-Medical): No  Physical Activity: Inactive (06/20/2021)   Exercise Vital Sign     Days of Exercise per Week: 0 days    Minutes of Exercise per Session: 0 min  Stress: No Stress Concern Present (06/20/2021)   [employer] of Occupational Health - Occupational Stress Questionnaire    Feeling of Stress : Not at all  Social Connections: Unknown (11/29/2021)   Received from [facility]   Social Network    Social Network: Not on file  Intimate Partner Violence: Not At Risk (06/27/2022)   Humiliation, Afraid, Rape, and Kick questionnaire    Fear of Current or Ex-Partner: No    Emotionally Abused: No    Physically Abused: No    Sexually Abused: No    - Patient used to smoke about 17 to 18 cigarettes a day for about 50 years, quit about 7 years ago - Originally from [place] - Worked in Dentist and [employer]  No Known Allergies   Outpatient Medications Prior to Visit  Medication Sig Dispense Refill   amLODipine (NORVASC) 10 MG tablet Take 1 tablet (10 mg total) by mouth daily. 90 tablet 1   Continuous Glucose Sensor (FREESTYLE LIBRE 2 SENSOR) MISC Use as directed. Change every 14 days. 7 each 1   DULoxetine (CYMBALTA) 30 MG capsule Take 1 capsule (30 mg total) by mouth daily. 30 capsule 1   empagliflozin (JARDIANCE) 25 MG TABS tablet Take 1 tablet (25 mg total) by mouth daily before breakfast. 90 tablet 3   ezetimibe (ZETIA) 10 MG tablet Take 1 tablet (10 mg total) by mouth daily. 90 tablet 1   Fluocinolone Acetonide Scalp (DERMA-SMOOTHE/FS SCALP) 0.01 % OIL Apply 1 application topically to the scalp daily. (Patient taking differently: Apply 1 application  topically daily as needed (scalp irritation).) 118.28 mL 11   fluticasone (FLONASE) 50 MCG/ACT nasal spray Place 2 sprays into both nostrils daily. 16 g 3   fluticasone-salmeterol (ADVAIR) 100-50 MCG/ACT AEPB Inhale 1 puff into the lungs 2 (two) times daily. 60 each 3   HYDROcodone-acetaminophen (NORCO/VICODIN) 5-325 MG tablet Take 1 tablet by mouth every 6 (six) to 8  (eight) hours as needed for pain 60 tablet 0   ipratropium (ATROVENT) 0.06 % nasal spray Place 1 spray into both nostrils 2 (  two) times daily. Can also use as needed prior to meals. 15 mL 1   latanoprost (XALATAN) 0.005 % ophthalmic solution Place 1 drop into both eyes at bedtime. 2.5 mL 11   losartan (COZAAR) 100 MG tablet Take 1 tablet (100 mg total) by mouth daily. 90 tablet 1   methocarbamol (ROBAXIN) 500 MG tablet Take 1-2 tablets (500-1,000 mg total) by mouth every 6 (six) to 8 (eight) hours as needed for spasms/muscle tension 60 tablet 2   metoprolol succinate (TOPROL-XL) 100 MG 24 hr tablet Take 1.5 tablets (150 mg total) by mouth daily. Take with or immediately following a meal. 135 tablet 1   oxyCODONE-acetaminophen (PERCOCET/ROXICET) 5-325 MG tablet Take 1-2 tablets by mouth every 4 (four) hours as needed for moderate pain or severe pain. 30 tablet 0   Oxymetazoline HCl (NASAL RELIEF NA) Place 1 spray into the nose in the morning.     repaglinide (PRANDIN) 0.5 MG tablet Take 1 tablet (0.5 mg total) by mouth daily before breakfast. 90 tablet 3   rosuvastatin (CRESTOR) 20 MG tablet Take 1 tablet (20 mg total) by mouth daily. 90 tablet 3   Semaglutide (RYBELSUS) 14 MG TABS Take 1 tablet (14 mg total) by mouth daily. 90 tablet 3   No facility-administered medications prior to visit.        Objective:   Physical Exam Vitals:   06/07/23 1556  BP: (!) 149/85  Pulse: 61  Temp: 97.7 F (36.5 C)  TempSrc: Oral  SpO2: 98%  Weight: 131 lb 9.6 oz (59.7 kg)  Height: 5\' 5"  (1.651 m)    Gen: Pleasant, well-nourished, in no distress,  normal affect  ENT: No lesions,  mouth clear,  oropharynx clear, no postnasal drip  Neck: No JVD, no stridor  Lungs: No use of accessory muscles, no crackles or wheezing on normal respiration, no wheeze on forced expiration  Cardiovascular: RRR, heart sounds normal, no murmur or gallops, no peripheral edema  Musculoskeletal: No deformities, no  cyanosis or clubbing  Neuro: alert, awake, non focal  Skin: Warm, no lesions or rash       Assessment & Plan:  Pulmonary nodule 1 cm or greater in diameter Reviewed his PET scan today.  There is mild to moderate hypermetabolism associated with the slowly growing right upper lobe pulmonary nodule.  I did explain to him that I am very concerned that this is probably a non-small cell lung cancer.  I recommended bronchoscopy.  He understands but he wants to wait because he still recovering from his back surgery.  I asked him to please consider this and let me know if he changes his mind.  In the meantime I will arrange for a repeat CT chest in February to look for interval change.  Hopefully at that time he will be willing to discuss possible intervention.  Chronic obstructive pulmonary disease ([name]) Not currently on bronchodilator therapy.  He was on Symbicort but stopped it and has not missed it.  Will keep albuterol available, hold off on scheduled BD for now    Levy Pupa, MD, PhD 06/07/2023, 4:27 PM [facility] [phone] or if no answer before 7:00PM call [phone] For any issues after 7:00PM please call eLink [phone]

## 2023-06-07 NOTE — Telephone Encounter (Signed)
Would you like to dismiss?
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
## 2023-06-12 NOTE — Progress Notes (Signed)
 [facility] Outpatient Return Visit      Assessment/Plan:   1. Stage 3b chronic kidney disease (CMD)  Renal Function Panel    2. Type 2 diabetes mellitus with stage 3b chronic kidney disease, with long-term current use of insulin  (CMD)      3. Essential hypertension, benign      4. Proteinuria, unspecified type        CKD stage III -likely secondary to longstanding uncontrolled diabetes.  Also has hypertension.  Last visit I restarted losartan  100 mg for proteinuria with UPCR of 814.  She had stopped it on his own -Creatinine is stable around 1.6 GFR 43.  Slight drop expected with restarting losartan  -Electrolytes are acceptable -Blood pressure well-controlled -Vitamin D and PTH were normal last visit -Kidney ultrasound was unremarkable with nonobstructing stone -Diabetes well-controlled last A1c of 6.9 in August.  He is currently not taking insulin .  He follows with endocrinologist.  Advised to let his endocrinologist know -Weight has gone up by 5 pounds.  Still at a healthy weight  Return in about 4 months (around 10/10/2023).  No orders of the defined types were placed in this encounter.   Steven Sadiq MD  [facility] 06/12/2023 11:06 AM    Interval History:    History of Present Illness:  Steven [name] returns for follow-up of CKD stage III   He is of African descent ([place]) with PMH of hypertension, diabetes, hyperlipidemia, CAD s/p stent 10 years ago, LVH, COPD, BPH status post TURP 2 years ago and CKD  He is accompanied by his wife today   Last visit he had stopped losartan  UPCR was 812 Creatinine was stable at 1.5 Prior creatinine variable between 1.5-1.7 over the last couple of years He did have a CT scan of the abdomen and pelvis in 2021 which showed normal kidneys  I restarted losartan  100 mg daily which he is now taking Creatinine is 1.6 GFR 43 which remains within his baseline Electrolytes are acceptable Blood pressure is  well-controlled Diabetes better controlled with last A1c of 6.9 in August.  He continues to follow with endocrinology.  He supposed to be on insulin  but says he is not taking it currently.  Also continues on Jardiance , repaglinide  and Rybelsus . He tells me that his endocrinologist stopped this.  I reviewed the notes and I did not see anything regarding this so advised him to call them  For BPH he is on Flomax .  Follows with Dr. Excell He follows with Cardiologist as well. Denies any urinary complaints  Ex-smoker - Quit 10 years   Review of Systems A complete ROS was performed with pertinent positives/negatives noted in the HPI. The remainder of the ROS are negative.   Past History:   Active Ambulatory Problems    Diagnosis Date Noted  . Type 2 diabetes mellitus with stage 3b chronic kidney disease, with long-term current use of insulin  (CMD) 04/17/2018  . OAG (open angle glaucoma) suspect, high risk, bilateral 04/17/2018  . Nuclear sclerotic cataract of both eyes 04/17/2018  . Choroidal nevus, left 04/17/2018  . Atherosclerotic PVD with intermittent claudication ([name]) 06/24/2013  . Enlarged prostate with urinary obstruction 12/29/2014  . Bronchiectasis ([name]) 07/30/2014  . CAD (coronary artery disease) 08/08/2012  . Chronic obstructive pulmonary disease ([name]) 12/01/2012  . Chronic renal insufficiency 12/24/2012  . Dyslipidemia 05/28/2017  . Essential hypertension, benign 08/08/2012  . Inguinal hernia of left side without obstruction or gangrene 09/05/2018  . Allergic rhinitis 11/29/2012  . GERD (gastroesophageal  reflux disease) 09/10/2012  . Hyperlipidemia with target LDL less than 70 08/25/2013  . PSA elevation 08/22/2016  . 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 Seborrheic dermatitis 12/24/2012  . Vasomotor rhinitis 02/01/2023  . Nasal turbinate hypertrophy 02/01/2023  . Allergy to pollen 02/01/2023  . LVH (left ventricular hypertrophy) 07/02/2020  . Stage 3b chronic  kidney disease (CMD) 02/28/2023  . Proteinuria 02/28/2023   Resolved Ambulatory Problems    Diagnosis Date Noted  . No Resolved Ambulatory Problems   Past Medical History:  Diagnosis Date  . Arthritis   . BPH (benign prostatic hyperplasia) 12/29/2014  . Diabetes mellitus (CMD)   . Hypertension   . Snores   . Uncontrolled diabetes mellitus type 2 without complications 04/17/2018  . Wears dentures   . Wears glasses    Family History  Problem Relation Name Age of Onset  . Diabetes Brother    . Glaucoma Brother    . Cataracts Mother    . Cataracts Father    . Cataracts Maternal Grandmother    . Cataracts Maternal Grandfather    . Cataracts Paternal Grandmother    . Cataracts Paternal Grandfather     Social History Social History   Tobacco Use  . Smoking status: Former    Current packs/day: 0.00    Types: Cigarettes    Quit date: 07/24/2013    Years since quitting: 9.8  . Smokeless tobacco: Never  Substance Use Topics  . Alcohol  use: Not Currently  . Drug use: Never   Social History   Social History Narrative  . Not on file     Medications:   Meds Ordered in [place]  Medication Sig Dispense Refill  . amLODIPine  (NORVASC ) 10 mg tablet Take 10 mg by mouth daily.    . aspirin  81 mg EC tablet Take 81 mg by mouth Once Daily.    . DULoxetine  (CYMBALTA ) 30 mg capsule Take 30 mg by mouth daily.    . empagliflozin  (JARDIANCE ) 25 mg tab Take 25 mg by mouth daily.    . ezetimibe  (ZETIA ) 10 mg tablet Take 10 mg by mouth Once Daily.    . fluticasone  propionate (FLONASE ) 50 mcg/spray nasal spray Administer 2 sprays into affected nostril(s).    . FreeStyle Libre 2 Sensor kit INJECT 1 DEVICE INTO THE SKIN EVERY 14 DAYS    . ipratropium (ATROVENT ) 42 mcg (0.06 %) spry nasal spray Administer 1 spray into each nostril 2 (two) times a day. The patient can also use this medication as needed prior to meals. 15 mL 1  . latanoprost  (XALATAN ) 0.005 % ophthalmic solution Administer 1 drop  into each eyes nightly. 2.5 mL 11  . losartan  (COZAAR ) 100 mg tablet Take 1 tablet (100 mg total) by mouth daily. 90 tablet 1  . metoprolol  succinate (TOPROL  XL) 100 mg 24 hr tablet Take 100 mg by mouth daily. TAKING 1.5 TABLETS DAILY    . repaglinide  (PRANDIN ) 0.5 mg tablet Take 0.5 mg by mouth daily before breakfast.    . rosuvastatin  (CRESTOR ) 20 mg tablet Take 20 mg by mouth daily.    . semaglutide  (Rybelsus ) 14 mg tab tablet Take 14 mg by mouth daily before breakfast.    . tamsulosin  (FLOMAX ) 0.4 mg cap Take 0.4 mg by mouth Once Daily.    . insulin  glargine U-300 (Toujeo  Max U-300 SoloStar) 300 unit/mL (3 mL) pen pen Inject 12 Units under the skin nightly. (Patient not taking: Reported on 06/12/2023)     No current Epic-ordered  facility-administered medications on file.      Physical Exam:   Vitals:   06/12/23 1011  BP: 113/64  BP Location: Right arm  Patient Position: Sitting  Pulse: 65  Weight: 59.9 kg (132 lb)  Height: 1.651 m (5' 5)    Wt Readings from Last 3 Encounters:  06/12/23 59.9 kg (132 lb)  05/07/23 58.1 kg (128 lb)  02/28/23 57.6 kg (127 lb)      General appearance: alert, appears stated age and cooperative,  Eyes: Sclera anicteric, no conjunctival pallor Nose, Mouth, Throat: Normal oral mucosa,  Neck: No neck masses palpable, No lymphadenopathy Lungs: Auscultation: Clear to auscultation bilaterally Heart: Carotid bruit absent, regular rate and rhythm and S1, S2 normal, edema none bilaterally Abdomen: soft, non tender, no abdominal bruit Musculoskeletal: Gait normal Skin: Skin color, texture, turgor normal. No rashes or lesions Psych: Mood pleasant, Alert and oriented x 3  Results for orders placed or performed in visit on 06/06/23  Renal Function Panel   Collection Time: 06/06/23  4:48 PM  Result Value Ref Range   Sodium 140 136 - 145 mmol/L   Potassium 4.0 3.5 - 5.1 mmol/L   Chloride 106 98 - 107 mmol/L   CO2 25 21 - 31 mmol/L   Anion Gap 9 6 -  14 mmol/L   Glucose, Random 163 (H) 70 - 99 mg/dL   Blood Urea Nitrogen (BUN) 19 7 - 25 mg/dL   Creatinine 8.35 (H) 9.29 - 1.30 mg/dL   eGFR 43 (L) >40 fO/fpw/8.26f7   Albumin 4.1 3.5 - 5.7 g/dL   Calcium  8.9 8.6 - 10.3 mg/dL   Phosphorus 3.4 2.5 - 5.0 mg/dL   BUN/Creatinine Ratio 11.6 10.0 - 20.0     Portions of this note were dictated using Animal nutritionist.  It has been reviewed for accuracy, but may contain grammatical and clerical errors.

## 2023-06-13 ENCOUNTER — Other Ambulatory Visit (HOSPITAL_BASED_OUTPATIENT_CLINIC_OR_DEPARTMENT_OTHER): Payer: Self-pay

## 2023-06-13 NOTE — Telephone Encounter (Signed)
 noted

## 2023-06-13 NOTE — Telephone Encounter (Signed)
 Patient calling about medicines that were discussed yesterday  Call back [phone]

## 2023-06-13 NOTE — Telephone Encounter (Signed)
 Spoke with Steven [name] and he states that he confirmed with his endocrinologist and he is only taking: Jardiance  25 mg Rybelsys 14 mg Repaglinide  0.5 mg for his diabetes. No longer on the insulin  due to low blood sugar. Insulin  replaced with the Repaglinide .  Chart updated.

## 2023-06-15 ENCOUNTER — Other Ambulatory Visit (HOSPITAL_BASED_OUTPATIENT_CLINIC_OR_DEPARTMENT_OTHER): Payer: Self-pay

## 2023-06-15 ENCOUNTER — Telehealth: Payer: Self-pay | Admitting: Internal Medicine

## 2023-06-15 NOTE — Telephone Encounter (Signed)
Pt came in to ask if he should continue taking his Fluticasone Propionate 50 mcg. Please advise pt.

## 2023-06-15 NOTE — Telephone Encounter (Signed)
Copied from CRM [id number]. Topic: Medicare AWV >> Jun 15, 2023  9:44 AM Payton Doughty wrote: Reason for CRM: Called LVM 06/15/2023 to schedule Annual Wellness Visit  Verlee Rossetti; Care Guide Ambulatory Clinical Support [place] [facility] Direct Dial: [phone]

## 2023-06-15 NOTE — Telephone Encounter (Signed)
Please advise- Pt dismissed as of today 06/15/23.

## 2023-06-15 NOTE — Telephone Encounter (Signed)
Patient has been dismissed, we can only provide emergency care. Recommend to discuss with his new provider

## 2023-06-18 ENCOUNTER — Other Ambulatory Visit (HOSPITAL_BASED_OUTPATIENT_CLINIC_OR_DEPARTMENT_OTHER): Payer: Self-pay

## 2023-06-18 ENCOUNTER — Other Ambulatory Visit: Payer: Self-pay | Admitting: Internal Medicine

## 2023-06-18 ENCOUNTER — Other Ambulatory Visit: Payer: Self-pay

## 2023-06-18 DIAGNOSIS — J309 Allergic rhinitis, unspecified: Secondary | ICD-10-CM

## 2023-06-18 MED ORDER — DULOXETINE HCL 30 MG PO CPEP
30.0000 mg | ORAL_CAPSULE | Freq: Every day | ORAL | 0 refills | Status: DC
  Filled 2023-06-18: qty 30, 30d supply, fill #0

## 2023-06-18 MED ORDER — FLUTICASONE PROPIONATE 50 MCG/ACT NA SUSP
2.0000 | Freq: Every day | NASAL | 0 refills | Status: DC
  Filled 2023-06-18: qty 16, 30d supply, fill #0

## 2023-06-18 MED ORDER — FREESTYLE LIBRE 3 PLUS SENSOR MISC
3 refills | Status: DC
  Filled 2023-06-18 – 2023-06-19 (×2): qty 6, 90d supply, fill #0
  Filled 2023-07-09: qty 2, 30d supply, fill #0
  Filled 2023-09-03: qty 6, 84d supply, fill #1
  Filled 2023-11-27: qty 6, 84d supply, fill #2

## 2023-06-19 ENCOUNTER — Other Ambulatory Visit: Payer: Self-pay

## 2023-06-19 ENCOUNTER — Other Ambulatory Visit (HOSPITAL_BASED_OUTPATIENT_CLINIC_OR_DEPARTMENT_OTHER): Payer: Self-pay

## 2023-06-27 ENCOUNTER — Other Ambulatory Visit (HOSPITAL_BASED_OUTPATIENT_CLINIC_OR_DEPARTMENT_OTHER): Payer: Self-pay

## 2023-07-03 ENCOUNTER — Other Ambulatory Visit (HOSPITAL_BASED_OUTPATIENT_CLINIC_OR_DEPARTMENT_OTHER): Payer: Self-pay

## 2023-07-03 MED ORDER — HYDROCODONE-ACETAMINOPHEN 5-325 MG PO TABS
1.0000 | ORAL_TABLET | Freq: Four times a day (QID) | ORAL | 0 refills | Status: DC | PRN
  Filled 2023-07-03: qty 60, 15d supply, fill #0

## 2023-07-09 ENCOUNTER — Other Ambulatory Visit (HOSPITAL_BASED_OUTPATIENT_CLINIC_OR_DEPARTMENT_OTHER): Payer: Self-pay

## 2023-07-23 ENCOUNTER — Encounter (HOSPITAL_BASED_OUTPATIENT_CLINIC_OR_DEPARTMENT_OTHER): Payer: Self-pay

## 2023-07-23 ENCOUNTER — Other Ambulatory Visit (HOSPITAL_COMMUNITY): Payer: Self-pay

## 2023-07-23 ENCOUNTER — Other Ambulatory Visit (HOSPITAL_BASED_OUTPATIENT_CLINIC_OR_DEPARTMENT_OTHER): Payer: Self-pay

## 2023-07-24 ENCOUNTER — Other Ambulatory Visit (HOSPITAL_BASED_OUTPATIENT_CLINIC_OR_DEPARTMENT_OTHER): Payer: Self-pay

## 2023-07-24 ENCOUNTER — Other Ambulatory Visit: Payer: Self-pay | Admitting: Internal Medicine

## 2023-07-25 ENCOUNTER — Other Ambulatory Visit: Payer: Self-pay | Admitting: Medical Genetics

## 2023-08-03 ENCOUNTER — Other Ambulatory Visit: Payer: Self-pay

## 2023-08-03 ENCOUNTER — Other Ambulatory Visit: Payer: Self-pay | Admitting: Internal Medicine

## 2023-08-03 ENCOUNTER — Other Ambulatory Visit (HOSPITAL_BASED_OUTPATIENT_CLINIC_OR_DEPARTMENT_OTHER): Payer: Self-pay

## 2023-08-06 ENCOUNTER — Other Ambulatory Visit (HOSPITAL_BASED_OUTPATIENT_CLINIC_OR_DEPARTMENT_OTHER): Payer: Self-pay

## 2023-08-10 ENCOUNTER — Ambulatory Visit: Payer: Medicare Other | Admitting: Internal Medicine

## 2023-08-10 ENCOUNTER — Other Ambulatory Visit (HOSPITAL_BASED_OUTPATIENT_CLINIC_OR_DEPARTMENT_OTHER): Payer: Self-pay

## 2023-08-10 MED ORDER — LATANOPROST 0.005 % OP SOLN
1.0000 [drp] | Freq: Every day | OPHTHALMIC | 3 refills | Status: DC
  Filled 2023-08-10: qty 5, 100d supply, fill #0

## 2023-08-21 ENCOUNTER — Other Ambulatory Visit (HOSPITAL_BASED_OUTPATIENT_CLINIC_OR_DEPARTMENT_OTHER): Payer: Self-pay

## 2023-08-22 ENCOUNTER — Other Ambulatory Visit (HOSPITAL_BASED_OUTPATIENT_CLINIC_OR_DEPARTMENT_OTHER): Payer: Self-pay

## 2023-08-27 ENCOUNTER — Other Ambulatory Visit: Payer: Self-pay | Admitting: Internal Medicine

## 2023-08-27 ENCOUNTER — Other Ambulatory Visit (HOSPITAL_BASED_OUTPATIENT_CLINIC_OR_DEPARTMENT_OTHER): Payer: Self-pay

## 2023-08-27 ENCOUNTER — Other Ambulatory Visit: Payer: Self-pay

## 2023-08-27 MED ORDER — LOSARTAN POTASSIUM 100 MG PO TABS
100.0000 mg | ORAL_TABLET | Freq: Every day | ORAL | 1 refills | Status: DC
  Filled 2023-08-27: qty 90, 90d supply, fill #0
  Filled 2023-11-26: qty 90, 90d supply, fill #1

## 2023-08-29 ENCOUNTER — Other Ambulatory Visit (HOSPITAL_BASED_OUTPATIENT_CLINIC_OR_DEPARTMENT_OTHER): Payer: Self-pay

## 2023-09-03 ENCOUNTER — Other Ambulatory Visit: Payer: Self-pay

## 2023-09-03 ENCOUNTER — Other Ambulatory Visit (HOSPITAL_BASED_OUTPATIENT_CLINIC_OR_DEPARTMENT_OTHER): Payer: Self-pay

## 2023-09-10 ENCOUNTER — Ambulatory Visit
Admission: RE | Admit: 2023-09-10 | Discharge: 2023-09-10 | Disposition: A | Discharge status: Home / Self Care | Payer: Medicare Other | Source: Ambulatory Visit | Attending: Emergency Medicine | Admitting: Emergency Medicine

## 2023-09-10 DIAGNOSIS — R911 Solitary pulmonary nodule: Secondary | ICD-10-CM

## 2023-09-17 ENCOUNTER — Other Ambulatory Visit (HOSPITAL_BASED_OUTPATIENT_CLINIC_OR_DEPARTMENT_OTHER): Payer: Self-pay

## 2023-09-19 ENCOUNTER — Other Ambulatory Visit: Payer: Self-pay | Admitting: Internal Medicine

## 2023-09-19 ENCOUNTER — Other Ambulatory Visit (HOSPITAL_BASED_OUTPATIENT_CLINIC_OR_DEPARTMENT_OTHER): Payer: Self-pay

## 2023-09-19 MED ORDER — IPRATROPIUM BROMIDE 0.06 % NA SOLN
1.0000 | Freq: Two times a day (BID) | NASAL | 1 refills | Status: DC
  Filled 2023-09-19: qty 15, 30d supply, fill #0

## 2023-09-19 NOTE — Telephone Encounter (Signed)
 Copied from CRM [id number]. Topic: Clinical - Medication Refill >> Sep 19, 2023  2:09 PM Chantha C wrote: Patient wants to speak with Dr. Minette Headland, patient was dismissed from the practice and to find a new provider. Patient has an upcoming appointment in May and needs diabetic and blood pressure medication refills, patient is out of medications. Patient is asking for refills until he can see the new pcp. Please advise [phone]  Medications: ezetimibe (ZETIA) 10 MG tablet, fluticasone (FLONASE) 50 MCG/ACT nasal spray, amLODipine (NORVASC) 10 MG tablet, and metoprolol succinate (TOPROL-XL) 100 MG 24 hr tablet   [facility] [address] [place] [address] [phone]  Per CAL, the office can only provide care 30 days after being dismissed. Patient was dismissed in 05/2023, the office cannot provide care for patient. Patient verified understanding and denies any other concerns.

## 2023-09-19 NOTE — Telephone Encounter (Signed)
 Copied from CRM [id number]. Topic: Clinical - Medication Refill >> Sep 19, 2023  1:38 PM Isabell A wrote: Most Recent Primary Care Visit:  Provider: Willow Ora E  Department: [facility]  Visit Type: PHYSICAL  Date: 05/16/2023  Medication: metoprolol succinate (TOPROL-XL) 100 MG 24 hr tablet amLODipine (NORVASC) 10 MG tablet ezetimibe (ZETIA) 10 MG tablet  Has the patient contacted their pharmacy? Yes (Agent: If no, request that the patient contact the pharmacy for the refill. If patient does not wish to contact the pharmacy document the reason why and proceed with request.) (Agent: If yes, when and what did the pharmacy advise?)  Is this the correct pharmacy for this prescription? Yes If no, delete pharmacy and type the correct one.  This is the patient's preferred pharmacy:  [facility] [place] [facility] [address] [place] [address] Phone: [phone] Fax: [phone]  [facility] - [place], [place] - [address] [place] [address] Phone: [phone] Fax: [phone]   Has the prescription been filled recently? Yes  Is the patient out of the medication? Yes  Has the patient been seen for an appointment in the last year OR does the patient have an upcoming appointment? Yes  Can we respond through MyChart? No  Agent: Please be advised that Rx refills may take up to 3 business days. We ask that you follow-up with your pharmacy.

## 2023-09-20 ENCOUNTER — Other Ambulatory Visit (HOSPITAL_BASED_OUTPATIENT_CLINIC_OR_DEPARTMENT_OTHER): Payer: Self-pay

## 2023-09-21 ENCOUNTER — Other Ambulatory Visit (HOSPITAL_BASED_OUTPATIENT_CLINIC_OR_DEPARTMENT_OTHER): Payer: Self-pay

## 2023-09-25 ENCOUNTER — Ambulatory Visit: Payer: Medicare Other | Admitting: Internal Medicine

## 2023-09-28 ENCOUNTER — Other Ambulatory Visit (HOSPITAL_BASED_OUTPATIENT_CLINIC_OR_DEPARTMENT_OTHER): Payer: Self-pay

## 2023-10-12 ENCOUNTER — Other Ambulatory Visit: Payer: Self-pay

## 2023-10-12 DIAGNOSIS — I739 Peripheral vascular disease, unspecified: Secondary | ICD-10-CM

## 2023-10-23 ENCOUNTER — Other Ambulatory Visit (HOSPITAL_BASED_OUTPATIENT_CLINIC_OR_DEPARTMENT_OTHER): Payer: Self-pay

## 2023-10-23 MED ORDER — METOPROLOL SUCCINATE ER 100 MG PO TB24
150.0000 mg | ORAL_TABLET | Freq: Every day | ORAL | 0 refills | Status: DC
Start: 2023-10-23 — End: 2023-11-28
  Filled 2023-10-23: qty 135, 90d supply, fill #0

## 2023-10-23 MED ORDER — AMLODIPINE BESYLATE 10 MG PO TABS
10.0000 mg | ORAL_TABLET | Freq: Every day | ORAL | 0 refills | Status: DC
Start: 2023-10-23 — End: 2023-11-28
  Filled 2023-10-23: qty 90, 90d supply, fill #0

## 2023-10-24 ENCOUNTER — Ambulatory Visit (HOSPITAL_COMMUNITY): Payer: Self-pay

## 2023-10-24 ENCOUNTER — Ambulatory Visit: Payer: Medicare Other

## 2023-10-24 ENCOUNTER — Ambulatory Visit (HOSPITAL_COMMUNITY): Payer: Self-pay | Attending: Vascular Surgery

## 2023-10-24 NOTE — Progress Notes (Deleted)
 Office Note   History of Present Illness   Steven [name] is a 78 y.o. (Dec 27, 1945) male who presents for surveillance of PAD.     Current Outpatient Medications  Medication Sig Dispense Refill   amLODipine (NORVASC) 10 MG tablet Take 1 tablet (10 mg total) by mouth daily. 90 tablet 1   amLODipine (NORVASC) 10 MG tablet Take 1 tablet (10 mg total) by mouth daily. 90 tablet 0   Continuous Glucose Sensor (FREESTYLE LIBRE 2 SENSOR) MISC Use as directed. Change every 14 days. 7 each 1   Continuous Glucose Sensor (FREESTYLE LIBRE 3 PLUS SENSOR) MISC Change sensor every 15 days. 6 each 3   DULoxetine (CYMBALTA) 30 MG capsule Take 1 capsule (30 mg total) by mouth daily. 30 capsule 0   empagliflozin (JARDIANCE) 25 MG TABS tablet Take 1 tablet (25 mg total) by mouth daily before breakfast. 90 tablet 3   ezetimibe (ZETIA) 10 MG tablet Take 1 tablet (10 mg total) by mouth daily. 90 tablet 1   Fluocinolone Acetonide Scalp (DERMA-SMOOTHE/FS SCALP) 0.01 % OIL Apply 1 application topically to the scalp daily. (Patient taking differently: Apply 1 application  topically daily as needed (scalp irritation).) 118.28 mL 11   fluticasone (FLONASE) 50 MCG/ACT nasal spray Place 2 sprays into both nostrils daily. 16 g 0   fluticasone-salmeterol (ADVAIR) 100-50 MCG/ACT AEPB Inhale 1 puff into the lungs 2 (two) times daily. 60 each 3   HYDROcodone-acetaminophen (NORCO/VICODIN) 5-325 MG tablet Take 1 tablet by mouth every 6 (six) to 8 (eight) hours as needed for pain. 60 tablet 0   ipratropium (ATROVENT) 0.06 % nasal spray Place 1 spray into both nostrils 2 (two) times daily Can also use as needed prior to meals 15 mL 1   latanoprost (XALATAN) 0.005 % ophthalmic solution Place 1 drop into both eyes at bedtime. 2.5 mL 11   latanoprost (XALATAN) 0.005 % ophthalmic solution Place 1 drop into both eyes at bedtime. 7.5 mL 3   losartan (COZAAR) 100 MG tablet Take 1 tablet (100 mg total) by mouth daily. 90 tablet 1    methocarbamol (ROBAXIN) 500 MG tablet Take 1-2 tablets (500-1,000 mg total) by mouth every 6 (six) to 8 (eight) hours as needed for spasms/muscle tension 60 tablet 2   metoprolol succinate (TOPROL-XL) 100 MG 24 hr tablet Take 1.5 tablets (150 mg total) by mouth daily. Take with or immediately following a meal. 135 tablet 1   metoprolol succinate (TOPROL-XL) 100 MG 24 hr tablet Take 1.5 tablets (150 mg total) by mouth daily. 135 tablet 0   oxyCODONE-acetaminophen (PERCOCET/ROXICET) 5-325 MG tablet Take 1-2 tablets by mouth every 4 (four) hours as needed for moderate pain or severe pain. 30 tablet 0   Oxymetazoline HCl (NASAL RELIEF NA) Place 1 spray into the nose in the morning.     repaglinide (PRANDIN) 0.5 MG tablet Take 1 tablet (0.5 mg total) by mouth daily before breakfast. 90 tablet 3   rosuvastatin (CRESTOR) 20 MG tablet Take 1 tablet (20 mg total) by mouth daily. 90 tablet 3   Semaglutide (RYBELSUS) 14 MG TABS Take 1 tablet (14 mg total) by mouth daily. 90 tablet 3   No current facility-administered medications for this visit.    ***REVIEW OF SYSTEMS (negative unless checked):   Cardiac:  []  Chest pain or chest pressure? []  Shortness of breath upon activity? []  Shortness of breath when lying flat? []  Irregular heart rhythm?  Vascular:  []  Pain in calf, thigh, or  hip brought on by walking? []  Pain in feet at night that wakes you up from your sleep? []  Blood clot in your veins? []  Leg swelling?  Pulmonary:  []  Oxygen at home? []  Productive cough? []  Wheezing?  Neurologic:  []  Sudden weakness in arms or legs? []  Sudden numbness in arms or legs? []  Sudden onset of difficult speaking or slurred speech? []  Temporary loss of vision in one eye? []  Problems with dizziness?  Gastrointestinal:  []  Blood in stool? []  Vomited blood?  Genitourinary:  []  Burning when urinating? []  Blood in urine?  Psychiatric:  []  Major depression  Hematologic:  []  Bleeding problems? []   Problems with blood clotting?  Dermatologic:  []  Rashes or ulcers?  Constitutional:  []  Fever or chills?  Ear/Nose/Throat:  []  Change in hearing? []  Nose bleeds? []  Sore throat?  Musculoskeletal:  []  Back pain? []  Joint pain? []  Muscle pain?   Physical Examination  ***There were no vitals filed for this visit. ***There is no height or weight on file to calculate BMI.  General:  WDWN in NAD; vital signs documented above Gait: Not observed HENT: WNL, normocephalic Pulmonary: normal non-labored breathing , without rales, rhonchi,  wheezing Cardiac: {Desc; regular/irreg:14544} HR, without murmurs {With/Without:20273} carotid bruit*** Abdomen: soft, NT, no masses Skin: {With/Without:20273} rashes Vascular Exam/Pulses: Palpable/nonpalpable femoral pulses, palpable/nonpalpable popliteal pulses, palpable/nonpalpable pedal pulses. Right DP/PT/Peroneal doppler signals. Left DP/PT/Peroneal doppler signals Extremities: {With/Without:20273} ischemic changes, {With/Without:20273} gangrene , {With/Without:20273} cellulitis; {With/Without:20273} open wounds;  Musculoskeletal: no muscle wasting or atrophy  Neurologic: A&O X 3;  No focal weakness or paresthesias are detected Psychiatric:  The pt has {Desc; normal/abnormal:11317::"Normal"} affect.  Non-Invasive Vascular Imaging ABI (***) R:  ABI: *** (***),  PT: {Signals:19197::"none","mono","bi","tri"} DP: {Signals:19197::"none","mono","bi","tri"} TBI:  *** L:  ABI: *** (***),  PT: {Signals:19197::"none","mono","bi","tri"} DP: {Signals:19197::"none","mono","bi","tri"} TBI: ***  Aortoiliac Duplex (***)   Bypass Duplex (***)    Medical Decision Making   KEVION FATHEREE is a 78 y.o. male who presents for surveillance of PAD  Based on the patient's vascular studies and examination, their ABIs are *** since the last visit *** bypass graft duplex demonstrates a *** The patient will continue to take *** and follow up with our  office in *** months/year with ABIs and *** BPG duplex   Loel Dubonnet PA-C [facility] Office: [phone]  Clinic MD: ***

## 2023-10-25 NOTE — Progress Notes (Unsigned)
 Cardiology Office Note:   Date:  10/26/2023  ID:  Steven [name], DOB 08/16/1945, MRN [id number] PCP: Aviva Kluver  [facility] Providers Cardiologist:  Rollene Rotunda, MD {  History of Present Illness:   Steven [name] is a 79 y.o. male who presents for follow up of CAD and coronary stent.  Since I last saw him he was fired by his primary care doctors and he was out of his blood pressure medicines.  He sees a nephrologist and they gave him these back a couple of days ago.  He says his blood pressure and sugar has not been well-controlled.  He has been getting some chest discomfort he knows when he walks behind a self-propelled mower.  If he walks quickly he will get mid discomfort similar to his previous angina.  It might last for half hour after he stops.  This is new this summer.  It is about 5 out of 10 in intensity.  He does not have associated nausea vomiting or diaphoresis.  He has not had associated shortness of breath, neck or arm discomfort.  He has not had any resting complaints.  He denies any new shortness of breath, PND or orthopnea.  Has had no new weight gain or edema.  He does have some pain in his left leg and he missed an appointment with [facility] but this has been rescheduled..    ROS: As stated in the HPI and negative for all other systems.  Studies Reviewed:    EKG:   EKG Interpretation Date/Time:  Friday October 26 2023 10:46:57 EDT Ventricular Rate:  59 PR Interval:  230 QRS Duration:  114 QT Interval:  418 QTC Calculation: 413 R Axis:   71  Text Interpretation: Sinus bradycardia with 1st degree A-V block Incomplete left bundle branch block Left ventricular hypertrophy with repolarization abnormality ( Sokolow-Lyon , Cornell product ) When compared with ECG of 09-Jun-2020 12:59, No significant change since last tracing Confirmed by Rollene Rotunda ([id number]) on 10/26/2023 11:01:48 AM    Risk Assessment/Calculations:       Physical Exam:   VS:  BP (!) 148/72 (BP  Location: Left Arm, Patient Position: Sitting, Cuff Size: Normal)   Pulse (!) 51   Ht 5\' 5"  (1.651 m)   Wt 135 lb (61.2 kg)   SpO2 99%   BMI 22.47 kg/m    Wt Readings from Last 3 Encounters:  10/26/23 135 lb (61.2 kg)  06/07/23 131 lb 9.6 oz (59.7 kg)  05/16/23 127 lb 6 oz (57.8 kg)     GEN: Well nourished, well developed in no acute distress NECK: No JVD; bilateral  carotid bruits CARDIAC: RRR, no murmurs, rubs, gallops RESPIRATORY:  Clear to auscultation without rales, wheezing or rhonchi  ABDOMEN: Soft, non-tender, non-distended EXTREMITIES:  No edema; No deformity , decreased dorsalis pedis and posttibial's bilaterally  ASSESSMENT AND PLAN:   CAD  The patient has no new sypmtoms.  No further cardiovascular testing is indicated.  We will continue with aggressive risk reduction and meds as listed.  Given prescription for sublingual nitroglycerin.  He knows that the stress test is normal but his symptoms progress I still want to see him.  For any unstable resting symptoms he should call 911.  Dyslipidemia His LDL was 68 previously but I do not have a recent 1.  I will have him come back for fasting lipid profile and LP(a) with a goal LDL of 55.    HTN The blood pressure is  elevated but he just started his meds back 2 days ago.  Him to have him keep a blood pressure diary at home.    CKD Creatinine was up to 1.96 and this is followed by nephrology.  DM His hemoglobin A1c was apparently per his report but he is now back on medicines and having this followed by his primary provider.   LVH He has moderate LVH on echo in 2022.  I will likely order an echo after I see him next.  PVD He has some leg pain and he is going to have this followed by rescheduled vascular surgery appointment.      Follow up with me in 4 months.   Signed, Rollene Rotunda, MD

## 2023-10-26 ENCOUNTER — Other Ambulatory Visit (HOSPITAL_BASED_OUTPATIENT_CLINIC_OR_DEPARTMENT_OTHER): Payer: Self-pay

## 2023-10-26 ENCOUNTER — Encounter: Payer: Self-pay | Admitting: Cardiology

## 2023-10-26 ENCOUNTER — Ambulatory Visit: Payer: Self-pay | Attending: Cardiology | Admitting: Cardiology

## 2023-10-26 VITALS — BP 148/72 | HR 51 | Ht 65.0 in | Wt 135.0 lb

## 2023-10-26 DIAGNOSIS — E785 Hyperlipidemia, unspecified: Secondary | ICD-10-CM

## 2023-10-26 DIAGNOSIS — I2510 Atherosclerotic heart disease of native coronary artery without angina pectoris: Secondary | ICD-10-CM

## 2023-10-26 DIAGNOSIS — I517 Cardiomegaly: Secondary | ICD-10-CM | POA: Diagnosis not present

## 2023-10-26 DIAGNOSIS — E118 Type 2 diabetes mellitus with unspecified complications: Secondary | ICD-10-CM

## 2023-10-26 DIAGNOSIS — R0989 Other specified symptoms and signs involving the circulatory and respiratory systems: Secondary | ICD-10-CM

## 2023-10-26 DIAGNOSIS — I10 Essential (primary) hypertension: Secondary | ICD-10-CM

## 2023-10-26 MED ORDER — NITROGLYCERIN 0.4 MG SL SUBL
0.4000 mg | SUBLINGUAL_TABLET | SUBLINGUAL | 11 refills | Status: DC | PRN
  Filled 2023-10-26: qty 25, 8d supply, fill #0

## 2023-10-26 NOTE — Patient Instructions (Signed)
 Medication Instructions:  Nitroglycerin 0.4 SL script sent. *If you need a refill on your cardiac medications before your next appointment, please call your pharmacy*  Lab Work: Fasting Lipid Lpa no appointment needed.  If you have labs (blood work) drawn today and your tests are completely normal, you will receive your results only by: MyChart Message (if you have MyChart) OR A paper copy in the mail If you have any lab test that is abnormal or we need to change your treatment, we will call you to review the results.  Testing/Procedures: Dr Antoine Poche has ordered a Myocardial Perfusion Imaging Study.   The test will take approximately 3 to 4 hours to complete; you may bring reading material.  If someone comes with you to your appointment, they will need to remain in the main lobby due to limited space in the testing area. **If you are pregnant or breastfeeding, please notify the nuclear lab prior to your appointment**  You will need to hold the following medications prior to your stress test: beta-blockers (24 hours prior to test)   How to prepare for your Myocardial Perfusion Test: Do not eat or drink 3 hours prior to your test, except you may have water. Do not consume products containing caffeine (regular or decaffeinated) 12 hours prior to your test. (ex: coffee, chocolate, sodas, tea). Do wear comfortable clothes (no dresses or overalls) and walking shoes, tennis shoes preferred (No heels or open toe shoes are allowed). Do NOT wear cologne, perfume, aftershave, or lotions (deodorant is allowed). If these instructions are not followed, your test will have to be rescheduled.   Follow-Up: At [facility], you and your health needs are our priority.  As part of our continuing mission to provide you with exceptional heart care, our providers are all part of one team.  This team includes your primary Cardiologist (physician) and Advanced Practice Providers or APPs (Physician  Assistants and Nurse Practitioners) who all work together to provide you with the care you need, when you need it.  Your next appointment:   4 month(s)  Provider:   Rollene Rotunda, MD     We recommend signing up for the patient portal called "MyChart".  Sign up information is provided on this After Visit Summary.  MyChart is used to connect with patients for Virtual Visits (Telemedicine).  Patients are able to view lab/test results, encounter notes, upcoming appointments, etc.  Non-urgent messages can be sent to your provider as well.   To learn more about what you can do with MyChart, go to [email or web address].   Other Instructions       1st Floor: - Lobby - Registration  - Pharmacy  - Lab - Cafe  2nd Floor: - PV Lab - Diagnostic Testing (echo, CT, nuclear med)  3rd Floor: - Vacant  4th Floor: - TCTS (cardiothoracic surgery) - AFib Clinic - Structural Heart Clinic - Vascular Surgery  - Vascular Ultrasound  5th Floor: - HeartCare Cardiology (general and EP) - Clinical Pharmacy for coumadin, hypertension, lipid, weight-loss medications, and med management appointments    Valet parking services will be available as well.

## 2023-11-03 LAB — LIPID PANEL
Chol/HDL Ratio: 2.6 ratio (ref 0.0–5.0)
Cholesterol, Total: 160 mg/dL (ref 100–199)
HDL: 62 mg/dL (ref >39)
LDL Chol Calc (NIH): 85 mg/dL (ref 0–99)
Triglycerides: 63 mg/dL (ref 0–149)
VLDL Cholesterol Cal: 13 mg/dL (ref 5–40)

## 2023-11-03 LAB — LIPOPROTEIN A (LPA): Lipoprotein (a): 193.5 nmol/L — ABNORMAL HIGH (ref <75.0)

## 2023-11-05 ENCOUNTER — Encounter: Payer: Self-pay | Admitting: *Deleted

## 2023-11-05 ENCOUNTER — Other Ambulatory Visit: Payer: Self-pay | Admitting: Cardiology

## 2023-11-05 ENCOUNTER — Other Ambulatory Visit (HOSPITAL_BASED_OUTPATIENT_CLINIC_OR_DEPARTMENT_OTHER): Payer: Self-pay

## 2023-11-05 MED ORDER — AZELASTINE HCL 137 MCG/SPRAY NA SOLN
2.0000 | Freq: Two times a day (BID) | NASAL | 2 refills | Status: DC
  Filled 2023-11-05: qty 30, 30d supply, fill #0
  Filled 2023-12-17: qty 30, 30d supply, fill #1

## 2023-11-05 MED ORDER — IPRATROPIUM BROMIDE 0.06 % NA SOLN
1.0000 | Freq: Two times a day (BID) | NASAL | 1 refills | Status: DC
Start: 2023-11-05 — End: 2024-02-08
  Filled 2023-11-05: qty 15, 30d supply, fill #0
  Filled 2023-12-17: qty 15, 30d supply, fill #1

## 2023-11-06 ENCOUNTER — Ambulatory Visit (HOSPITAL_COMMUNITY)
Admission: RE | Admit: 2023-11-06 | Discharge: 2023-11-06 | Disposition: A | Discharge status: Home / Self Care | Source: Ambulatory Visit | Attending: Vascular Surgery | Admitting: Vascular Surgery

## 2023-11-06 ENCOUNTER — Ambulatory Visit (INDEPENDENT_AMBULATORY_CARE_PROVIDER_SITE_OTHER)
Admission: RE | Admit: 2023-11-06 | Discharge: 2023-11-06 | Disposition: A | Discharge status: Home / Self Care | Source: Ambulatory Visit | Attending: Vascular Surgery | Admitting: Vascular Surgery

## 2023-11-06 ENCOUNTER — Other Ambulatory Visit (HOSPITAL_BASED_OUTPATIENT_CLINIC_OR_DEPARTMENT_OTHER): Payer: Self-pay

## 2023-11-06 DIAGNOSIS — I739 Peripheral vascular disease, unspecified: Secondary | ICD-10-CM | POA: Insufficient documentation

## 2023-11-06 LAB — VAS US ABI WITH/WO TBI
Left ABI: 0.38
Right ABI: 0.87

## 2023-11-06 MED ORDER — CLINDAMYCIN PHOSPHATE 1 % EX LOTN
TOPICAL_LOTION | CUTANEOUS | 5 refills | Status: DC
Start: 2023-11-06 — End: 2024-02-08
  Filled 2023-11-06: qty 60, 30d supply, fill #0
  Filled 2024-01-13: qty 60, 30d supply, fill #1

## 2023-11-06 MED ORDER — ROSUVASTATIN CALCIUM 20 MG PO TABS
20.0000 mg | ORAL_TABLET | Freq: Every day | ORAL | 3 refills | Status: DC
  Filled 2023-11-06: qty 90, 90d supply, fill #0

## 2023-11-06 MED ORDER — DOXYCYCLINE HYCLATE 100 MG PO CAPS
100.0000 mg | ORAL_CAPSULE | Freq: Two times a day (BID) | ORAL | 0 refills | Status: AC
Start: 2023-11-06 — End: ?
  Filled 2023-11-06: qty 60, 30d supply, fill #0

## 2023-11-06 MED ORDER — ROSUVASTATIN CALCIUM 20 MG PO TABS
20.0000 mg | ORAL_TABLET | Freq: Every day | ORAL | 0 refills | Status: DC
  Filled 2023-11-06: qty 30, 30d supply, fill #0

## 2023-11-07 ENCOUNTER — Other Ambulatory Visit (HOSPITAL_BASED_OUTPATIENT_CLINIC_OR_DEPARTMENT_OTHER): Payer: Self-pay

## 2023-11-07 ENCOUNTER — Ambulatory Visit (INDEPENDENT_AMBULATORY_CARE_PROVIDER_SITE_OTHER): Admitting: Physician Assistant

## 2023-11-07 VITALS — BP 164/69 | HR 50 | Temp 98.6°F | Ht 65.0 in | Wt 137.3 lb

## 2023-11-07 DIAGNOSIS — Z95828 Presence of other vascular implants and grafts: Secondary | ICD-10-CM

## 2023-11-07 DIAGNOSIS — I70213 Atherosclerosis of native arteries of extremities with intermittent claudication, bilateral legs: Secondary | ICD-10-CM | POA: Diagnosis not present

## 2023-11-07 DIAGNOSIS — I739 Peripheral vascular disease, unspecified: Secondary | ICD-10-CM

## 2023-11-07 NOTE — Progress Notes (Signed)
 Office Note     CC:  follow up Requesting Provider:  No ref. provider found  HPI: Steven [name] is a 78 y.o. (06-19-1946) male who presents for surveillance of PAD.  He has a history of a right femoral to popliteal bypass with vein by Dr. Hart Rochester in 2014.  This was performed due to lifestyle limiting claudication of the right lower extremity.  He has a known left SFA occlusion.  He has claudication symptoms of the left calf when walking fast or uphill but otherwise symptoms are tolerable.  He does not have rest pain or tissue loss of bilateral lower extremities.  He also has had lumbar spine surgery in 2024 and has known radiculopathy down bilateral lower extremities.  He takes a statin and aspirin daily.  He is a former smoker.   Past Medical History:  Diagnosis Date   Allergy    Arthritis    Right Hip   Atrial fibrillation ([name])    BCC (basal cell carcinoma of skin)    BPH (benign prostatic hyperplasia)    COPD (chronic obstructive pulmonary disease) ([name])    Coronary artery disease    Lexiscan Myoview 7/14:  Inf scar with minimal reversibility, inf HK, EF 45%;  Low risk.  Promus LAD stent placed in [place].  100% RCA   Diabetes 1.5, managed as type 1 ([name])    Diverticulosis    on CT   Dyslipidemia    GERD (gastroesophageal reflux disease)    has used Zantac in the past    Glaucoma    Hammer toes, bilateral    Hernia    Hyperlipidemia    Hypertension    Peripheral vascular disease ([name])    a. s/p R fem-pop BPG 01/2013 (Dr. 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    Past Surgical History:  Procedure Laterality Date   ABDOMINAL AORTAGRAM N/A 12/10/2012   Procedure: ABDOMINAL Ronny Flurry;  Surgeon: Nada Libman, MD;  Location: [facility] CATH LAB;  Service: Cardiovascular;  Laterality: N/A;   CARDIAC CATHETERIZATION  12/05/2010   with stent placement   FEMORAL-POPLITEAL BYPASS GRAFT Right 02/07/2013   Procedure: BYPASS GRAFT  FEMORAL-POPLITEAL ARTERY- RIGHT;  Surgeon: Pryor Ochoa, MD;  Location: [facility] OR;  Service: Vascular;  Laterality: Right;  Right femoral to below knee popliteal bypass using right non reversed great saphenous vein   INGUINAL HERNIA REPAIR  2010   Right   INGUINAL HERNIA REPAIR  09/11/2018   INTRAOPERATIVE ARTERIOGRAM Right 02/07/2013   Procedure: Attempted INTRA OPERATIVE ARTERIOGRAM;  Surgeon: Pryor Ochoa, MD;  Location: [facility] OR;  Service: Vascular;  Laterality: Right;   IR RETROGRADE PYELOGRAM  12/10/2019   TRANSFORAMINAL LUMBAR INTERBODY FUSION (TLIF) WITH PEDICLE SCREW FIXATION 1 LEVEL Right 04/12/2023   Procedure: RIGHT-SIDED LUMBAR 4- LUMBAR 5 TRANSFORAMINAL LUMBAR INTERBODY FUSION AND DECOMPRESSION WITH INSTRUMENTATION AND ALLOGRAFT;  Surgeon: Estill Bamberg, MD;  Location: [facility] OR;  Service: Orthopedics;  Laterality: Right;   TRANSURETHRAL RESECTION OF PROSTATE  12/10/2019    Social History   Socioeconomic History   Marital status: Married    Spouse name: Not on file   Number of children: 5   Years of education: Not on file   Highest education level: Not on file  Occupational History   Occupation: retired, works part time, Hospital doctor   Occupation: Research scientist (life sciences) industry [place]  Tobacco Use   Smoking status: Former    Current packs/day: 0.00  Average packs/day: 0.3 packs/day for 50.0 years (15.0 ttl pk-yrs)    Types: Cigarettes    Start date: 02/03/1961    Quit date: 02/04/2011    Years since quitting: 12.7    Passive exposure: Never   Smokeless tobacco: Never  Vaping Use   Vaping status: Never Used  Substance and Sexual Activity   Alcohol use: Not Currently   Drug use: No   Sexual activity: Not on file  Other Topics Concern   Not on file  Social History Narrative   5 children , lost a son    Lives with wife.     Social Drivers of Corporate investment banker Strain: Low Risk  (06/20/2021)   Overall Financial Resource Strain (CARDIA)    Difficulty of Paying Living Expenses:  Not hard at all  Food Insecurity: No Food Insecurity (06/27/2022)   Hunger Vital Sign    Worried About Running Out of Food in the Last Year: Never true    Ran Out of Food in the Last Year: Never true  Transportation Needs: No Transportation Needs (06/27/2022)   PRAPARE - Administrator, Civil Service (Medical): No    Lack of Transportation (Non-Medical): No  Physical Activity: Inactive (06/20/2021)   Exercise Vital Sign    Days of Exercise per Week: 0 days    Minutes of Exercise per Session: 0 min  Stress: No Stress Concern Present (06/20/2021)   [employer] of Occupational Health - Occupational Stress Questionnaire    Feeling of Stress : Not at all  Social Connections: Unknown (11/29/2021)   [facility]   Social Network    Social Network: Not on file  Intimate Partner Violence: Not At Risk (06/27/2022)   Humiliation, Afraid, Rape, and Kick questionnaire    Fear of Current or Ex-Partner: No    Emotionally Abused: No    Physically Abused: No    Sexually Abused: No    Family History  Problem Relation Age of Onset   Diabetes Mother    Hypertension Mother    Heart disease Mother    Hyperlipidemia Mother    Diabetes Father    Heart disease Father    Hyperlipidemia Father    Hypertension Father    Diabetes Sister    Heart disease Sister    Hyperlipidemia Sister    Hypertension Sister    Diabetes Brother    Heart disease Brother    Hyperlipidemia Brother    Hypertension Brother    Diabetes Son    Heart disease Son    Hyperlipidemia Son    Hypertension Son    Pancreatic cancer Son    Alcohol abuse Neg Hx    Cancer Neg Hx    Depression Neg Hx    Early death Neg Hx    Kidney disease Neg Hx    Stroke Neg Hx    Colon cancer Neg Hx    Esophageal cancer Neg Hx    Rectal cancer Neg Hx    Stomach cancer Neg Hx    Liver cancer Neg Hx     Current Outpatient Medications  Medication Sig Dispense Refill   amLODipine (NORVASC) 10  MG tablet Take 1 tablet (10 mg total) by mouth daily. 90 tablet 0   aspirin EC 81 MG tablet Take 81 mg by mouth daily. Swallow whole.     Azelastine HCl 137 MCG/SPRAY SOLN Place 2 sprays into both nostrils 2 (two) times daily. 30 mL 2  clindamycin (CLEOCIN T) 1 % lotion Apply to affected area of face twice daily. 60 mL 5   Continuous Glucose Sensor (FREESTYLE LIBRE 3 PLUS SENSOR) MISC Change sensor every 15 days. 6 each 3   doxycycline (VIBRAMYCIN) 100 MG capsule Take 1 capsule (100 mg total) by mouth 2 (two) times daily. Take with food and water to avoid stomach upset. 60 capsule 0   DULoxetine (CYMBALTA) 30 MG capsule Take 1 capsule (30 mg total) by mouth daily. 30 capsule 0   empagliflozin (JARDIANCE) 25 MG TABS tablet Take 1 tablet (25 mg total) by mouth daily before breakfast. 90 tablet 3   ezetimibe (ZETIA) 10 MG tablet Take 1 tablet (10 mg total) by mouth daily. 90 tablet 1   Fluocinolone Acetonide Scalp (DERMA-SMOOTHE/FS SCALP) 0.01 % OIL Apply 1 application topically to the scalp daily. (Patient taking differently: Apply 1 application  topically daily as needed (scalp irritation).) 118.28 mL 11   fluticasone (FLONASE) 50 MCG/ACT nasal spray Place 2 sprays into both nostrils daily. 16 g 0   fluticasone-salmeterol (ADVAIR) 100-50 MCG/ACT AEPB Inhale 1 puff into the lungs 2 (two) times daily. 60 each 3   ipratropium (ATROVENT) 0.06 % nasal spray Place 1 spray into both nostrils 2 (two) times daily. Can also use medication as needed before activities that cause the nose to run, like eating. 15 mL 1   latanoprost (XALATAN) 0.005 % ophthalmic solution Place 1 drop into both eyes at bedtime. 2.5 mL 11   latanoprost (XALATAN) 0.005 % ophthalmic solution Place 1 drop into both eyes at bedtime. 7.5 mL 3   losartan (COZAAR) 100 MG tablet Take 1 tablet (100 mg total) by mouth daily. 90 tablet 1   methocarbamol (ROBAXIN) 500 MG tablet Take 1-2 tablets (500-1,000 mg total) by mouth every 6 (six) to 8  (eight) hours as needed for spasms/muscle tension 60 tablet 2   metoprolol succinate (TOPROL-XL) 100 MG 24 hr tablet Take 1.5 tablets (150 mg total) by mouth daily. 135 tablet 0   nitroGLYCERIN (NITROSTAT) 0.4 MG SL tablet Place 1 tablet (0.4 mg total) under the tongue every 5 (five) minutes as needed for chest pain. 25 tablet 11   Oxymetazoline HCl (NASAL RELIEF NA) Place 1 spray into the nose in the morning.     repaglinide (PRANDIN) 0.5 MG tablet Take 1 tablet (0.5 mg total) by mouth daily before breakfast. 90 tablet 3   rosuvastatin (CRESTOR) 20 MG tablet Take 1 tablet (20 mg total) by mouth daily. 30 tablet 0   Semaglutide (RYBELSUS) 14 MG TABS Take 1 tablet (14 mg total) by mouth daily. 90 tablet 3   No current facility-administered medications for this visit.    No Known Allergies   REVIEW OF SYSTEMS:   [X]  denotes positive finding, [ ]  denotes negative finding Cardiac  Comments:  Chest pain or chest pressure:    Shortness of breath upon exertion:    Short of breath when lying flat:    Irregular heart rhythm:        Vascular    Pain in calf, thigh, or hip brought on by ambulation:    Pain in feet at night that wakes you up from your sleep:     Blood clot in your veins:    Leg swelling:         Pulmonary    Oxygen at home:    Productive cough:     Wheezing:         Neurologic  Sudden weakness in arms or legs:     Sudden numbness in arms or legs:     Sudden onset of difficulty speaking or slurred speech:    Temporary loss of vision in one eye:     Problems with dizziness:         Gastrointestinal    Blood in stool:     Vomited blood:         Genitourinary    Burning when urinating:     Blood in urine:        Psychiatric    Major depression:         Hematologic    Bleeding problems:    Problems with blood clotting too easily:        Skin    Rashes or ulcers:        Constitutional    Fever or chills:      PHYSICAL EXAMINATION:  Vitals:   11/07/23  1248  BP: (!) 164/69  Pulse: (!) 50  Temp: 98.6 F (37 C)  TempSrc: Temporal  SpO2: 98%  Weight: 137 lb 4.8 oz (62.3 kg)  Height: 5\' 5"  (1.651 m)    General:  WDWN in NAD; vital signs documented above Gait: Not observed HENT: WNL, normocephalic Pulmonary: normal non-labored breathing , without Rales, rhonchi,  wheezing Cardiac: regular HR Abdomen: soft, NT, no masses Skin: without rashes Vascular Exam/Pulses: palpable R PT pulse; absent L pedal pulses Extremities: without ischemic changes, without Gangrene , without cellulitis; without open wounds;  Musculoskeletal: no muscle wasting or atrophy  Neurologic: A&O X 3 Psychiatric:  The pt has Normal affect.   Non-Invasive Vascular Imaging:   Patent right femoropopliteal bypass.  Elevated inflow velocity however this is stable compared to even studies from 5 years ago  CTA aorta with runoff demonstrates a widely patent right leg bypass from January 2024  ABI/TBIToday's ABIToday's TBIPrevious ABIPrevious TBI  +-------+-----------+-----------+------------+------------+  Right 0.87       0.36       0.89        0.35          +-------+-----------+-----------+------------+------------+  Left  0.38       0.0        0.48        0.0           +-------+-----------+-----------+------------+------------+    ASSESSMENT/PLAN:: 77 y.o. male here for follow up for surveillance of right leg bypass and PAD  Right leg well-perfused with widely patent bypass.  He has slightly elevated inflow velocities seen on duplex however these are stable as far back as 5 years ago.  He also has a CTA aorta with runoff in January 2024 demonstrating widely patent inflow.  No indication for intervention at this time.  He has claudication symptoms of the left calf when walking fast or uphill.  He has a known left SFA occlusion.  He does not have rest pain or tissue loss.  Currently claudication symptoms are tolerable.  Encouraged him to walk is  much as possible to promote collateral flow.  Continue aspirin and statin daily.  Repeat right lower extremity bypass duplex and ABI in 1 year.   Emilie Rutter, PA-C [facility] [phone]  Clinic MD:   Karin Lieu on call

## 2023-11-12 ENCOUNTER — Other Ambulatory Visit (HOSPITAL_BASED_OUTPATIENT_CLINIC_OR_DEPARTMENT_OTHER): Payer: Self-pay

## 2023-11-26 ENCOUNTER — Other Ambulatory Visit (HOSPITAL_BASED_OUTPATIENT_CLINIC_OR_DEPARTMENT_OTHER): Payer: Self-pay

## 2023-11-27 ENCOUNTER — Other Ambulatory Visit (HOSPITAL_BASED_OUTPATIENT_CLINIC_OR_DEPARTMENT_OTHER): Payer: Self-pay

## 2023-11-28 ENCOUNTER — Other Ambulatory Visit (HOSPITAL_BASED_OUTPATIENT_CLINIC_OR_DEPARTMENT_OTHER): Payer: Self-pay

## 2023-11-28 MED ORDER — METOPROLOL SUCCINATE ER 100 MG PO TB24
150.0000 mg | ORAL_TABLET | Freq: Every day | ORAL | 0 refills | Status: DC
  Filled 2023-11-28 – 2023-12-31 (×4): qty 135, 90d supply, fill #0

## 2023-11-28 MED ORDER — LOSARTAN POTASSIUM 100 MG PO TABS
100.0000 mg | ORAL_TABLET | Freq: Every day | ORAL | 1 refills | Status: DC
  Filled 2023-11-28: qty 90, 90d supply, fill #0

## 2023-11-28 MED ORDER — RYBELSUS 14 MG PO TABS
14.0000 mg | ORAL_TABLET | ORAL | 3 refills | Status: DC
  Filled 2023-11-28 – 2023-12-28 (×2): qty 90, 90d supply, fill #0

## 2023-11-28 MED ORDER — HYDRALAZINE HCL 25 MG PO TABS
25.0000 mg | ORAL_TABLET | Freq: Three times a day (TID) | ORAL | 3 refills | Status: DC
  Filled 2023-11-28: qty 270, 90d supply, fill #0

## 2023-11-28 MED ORDER — JARDIANCE 25 MG PO TABS
25.0000 mg | ORAL_TABLET | Freq: Every day | ORAL | 3 refills | Status: DC
  Filled 2023-11-28: qty 90, 90d supply, fill #0

## 2023-11-28 MED ORDER — AMLODIPINE BESYLATE 10 MG PO TABS
10.0000 mg | ORAL_TABLET | Freq: Every day | ORAL | 0 refills | Status: DC
  Filled 2023-11-28 – 2024-01-13 (×2): qty 90, 90d supply, fill #0

## 2023-11-30 ENCOUNTER — Ambulatory Visit (HOSPITAL_COMMUNITY)
Admission: RE | Admit: 2023-11-30 | Discharge: 2023-11-30 | Disposition: A | Discharge status: Home / Self Care | Payer: Self-pay | Source: Ambulatory Visit | Attending: Cardiology | Admitting: Cardiology

## 2023-11-30 DIAGNOSIS — R0989 Other specified symptoms and signs involving the circulatory and respiratory systems: Secondary | ICD-10-CM | POA: Insufficient documentation

## 2023-12-02 ENCOUNTER — Other Ambulatory Visit: Payer: Self-pay | Admitting: Internal Medicine

## 2023-12-03 ENCOUNTER — Encounter: Payer: Self-pay | Admitting: *Deleted

## 2023-12-03 ENCOUNTER — Other Ambulatory Visit (HOSPITAL_BASED_OUTPATIENT_CLINIC_OR_DEPARTMENT_OTHER): Payer: Self-pay

## 2023-12-04 ENCOUNTER — Ambulatory Visit: Payer: Self-pay | Admitting: *Deleted

## 2023-12-04 ENCOUNTER — Other Ambulatory Visit (HOSPITAL_BASED_OUTPATIENT_CLINIC_OR_DEPARTMENT_OTHER): Payer: Self-pay

## 2023-12-04 MED ORDER — GLIPIZIDE ER 5 MG PO TB24
5.0000 mg | ORAL_TABLET | Freq: Every day | ORAL | 3 refills | Status: DC
  Filled 2023-12-04: qty 90, 90d supply, fill #0

## 2023-12-05 ENCOUNTER — Other Ambulatory Visit (HOSPITAL_BASED_OUTPATIENT_CLINIC_OR_DEPARTMENT_OTHER): Payer: Self-pay

## 2023-12-06 ENCOUNTER — Other Ambulatory Visit: Payer: Self-pay

## 2023-12-06 ENCOUNTER — Other Ambulatory Visit (HOSPITAL_BASED_OUTPATIENT_CLINIC_OR_DEPARTMENT_OTHER): Payer: Self-pay

## 2023-12-07 ENCOUNTER — Other Ambulatory Visit (HOSPITAL_BASED_OUTPATIENT_CLINIC_OR_DEPARTMENT_OTHER): Payer: Self-pay

## 2023-12-10 ENCOUNTER — Other Ambulatory Visit (HOSPITAL_BASED_OUTPATIENT_CLINIC_OR_DEPARTMENT_OTHER): Payer: Self-pay

## 2023-12-10 MED ORDER — METOPROLOL SUCCINATE ER 100 MG PO TB24
150.0000 mg | ORAL_TABLET | Freq: Every day | ORAL | 0 refills | Status: DC
  Filled 2023-12-10: qty 135, 90d supply, fill #0

## 2023-12-14 ENCOUNTER — Ambulatory Visit: Payer: Self-pay | Admitting: Emergency Medicine

## 2023-12-14 ENCOUNTER — Other Ambulatory Visit (HOSPITAL_BASED_OUTPATIENT_CLINIC_OR_DEPARTMENT_OTHER): Payer: Self-pay

## 2023-12-14 DIAGNOSIS — R911 Solitary pulmonary nodule: Secondary | ICD-10-CM

## 2023-12-18 ENCOUNTER — Other Ambulatory Visit (HOSPITAL_BASED_OUTPATIENT_CLINIC_OR_DEPARTMENT_OTHER): Payer: Self-pay

## 2023-12-20 ENCOUNTER — Other Ambulatory Visit (HOSPITAL_BASED_OUTPATIENT_CLINIC_OR_DEPARTMENT_OTHER): Payer: Self-pay

## 2023-12-21 ENCOUNTER — Other Ambulatory Visit (HOSPITAL_BASED_OUTPATIENT_CLINIC_OR_DEPARTMENT_OTHER): Payer: Self-pay

## 2023-12-21 ENCOUNTER — Telehealth: Payer: Self-pay | Admitting: Cardiology

## 2023-12-21 DIAGNOSIS — E785 Hyperlipidemia, unspecified: Secondary | ICD-10-CM

## 2023-12-21 DIAGNOSIS — I2510 Atherosclerotic heart disease of native coronary artery without angina pectoris: Secondary | ICD-10-CM

## 2023-12-21 MED ORDER — ROSUVASTATIN CALCIUM 40 MG PO TABS
40.0000 mg | ORAL_TABLET | Freq: Every day | ORAL | 3 refills | Status: DC
  Filled 2023-12-21: qty 90, 90d supply, fill #0

## 2023-12-21 MED ORDER — ROSUVASTATIN CALCIUM 40 MG PO TABS
20.0000 mg | ORAL_TABLET | Freq: Every day | ORAL | 3 refills | Status: DC
  Filled 2023-12-21: qty 90, 180d supply, fill #0

## 2023-12-21 NOTE — Telephone Encounter (Signed)
 Pt c/o medication issue:  1. Name of Medication:   rosuvastatin  (CRESTOR ) 20 MG tablet    2. How are you currently taking this medication (dosage and times per day)? As written  3. Are you having a reaction (difficulty breathing--STAT)? No   4. What is your medication issue? Pt wants his crestor  increased to 40 MG due to his lab results

## 2023-12-21 NOTE — Telephone Encounter (Signed)
 Returned patient's call regarding the increase in Crestor  to 40 mg. Per chart review, Dr. Lavonne Prairie has reviewed labs who recommends no change in current therapy regarding the elevated LPA, but upon further research, Dr. Lavonne Prairie would actually like to increase the Crestor  to 40 mg.  Called patient again to clarify and ensure he was correct. He needs a RX for Crestor  40 mg send to [facility]. [facility]acy. Will enter Lipid panel order due 03/24/2024. Pt aware he needs to be fasting and have labs drawn three months after increasing his Crestor  dose.

## 2023-12-24 ENCOUNTER — Other Ambulatory Visit (HOSPITAL_BASED_OUTPATIENT_CLINIC_OR_DEPARTMENT_OTHER): Payer: Self-pay

## 2023-12-25 ENCOUNTER — Other Ambulatory Visit (HOSPITAL_BASED_OUTPATIENT_CLINIC_OR_DEPARTMENT_OTHER): Payer: Self-pay

## 2023-12-28 ENCOUNTER — Other Ambulatory Visit (HOSPITAL_BASED_OUTPATIENT_CLINIC_OR_DEPARTMENT_OTHER): Payer: Self-pay

## 2023-12-31 ENCOUNTER — Other Ambulatory Visit (HOSPITAL_BASED_OUTPATIENT_CLINIC_OR_DEPARTMENT_OTHER): Payer: Self-pay

## 2024-01-09 ENCOUNTER — Other Ambulatory Visit (HOSPITAL_BASED_OUTPATIENT_CLINIC_OR_DEPARTMENT_OTHER): Payer: Self-pay

## 2024-01-09 MED ORDER — DOXYCYCLINE HYCLATE 100 MG PO CAPS
100.0000 mg | ORAL_CAPSULE | Freq: Two times a day (BID) | ORAL | 0 refills | Status: DC
  Filled 2024-01-09: qty 60, 30d supply, fill #0

## 2024-01-11 ENCOUNTER — Other Ambulatory Visit (HOSPITAL_BASED_OUTPATIENT_CLINIC_OR_DEPARTMENT_OTHER): Payer: Self-pay

## 2024-01-11 MED ORDER — CYCLOBENZAPRINE HCL 5 MG PO TABS
5.0000 mg | ORAL_TABLET | Freq: Every evening | ORAL | 0 refills | Status: DC | PRN
  Filled 2024-01-11: qty 60, 30d supply, fill #0

## 2024-01-11 MED ORDER — HYDROCODONE-ACETAMINOPHEN 5-325 MG PO TABS
1.0000 | ORAL_TABLET | Freq: Three times a day (TID) | ORAL | 0 refills | Status: DC
  Filled 2024-01-11: qty 21, 7d supply, fill #0
  Filled 2024-01-17: qty 21, 7d supply, fill #1
  Filled 2024-01-18: qty 39, 13d supply, fill #1

## 2024-01-14 ENCOUNTER — Other Ambulatory Visit (HOSPITAL_BASED_OUTPATIENT_CLINIC_OR_DEPARTMENT_OTHER): Payer: Self-pay

## 2024-01-14 ENCOUNTER — Other Ambulatory Visit: Payer: Self-pay

## 2024-01-17 ENCOUNTER — Other Ambulatory Visit (HOSPITAL_BASED_OUTPATIENT_CLINIC_OR_DEPARTMENT_OTHER): Payer: Self-pay

## 2024-01-18 ENCOUNTER — Other Ambulatory Visit: Payer: Self-pay

## 2024-01-18 ENCOUNTER — Other Ambulatory Visit (HOSPITAL_BASED_OUTPATIENT_CLINIC_OR_DEPARTMENT_OTHER): Payer: Self-pay

## 2024-01-18 NOTE — Telephone Encounter (Signed)
 Copied from CRM #[id number]. Topic: Referral - Referrals/Orders [place] >> Jan 18, 2024  3:20 PM Rosalene DEL wrote: CARRELL, RAHMANI is calling for clinical concerns (Ask: What symptoms are you calling about today, AND how long have you had these symptoms? Must Review HPKW list for symptoms) Document Name of Triage Nurse/BH Rep taking the call when applicable) and other request    Include all details related to the request(s) below: Patient totally disabled and requesting referral for home health aid /med management    Confirm and type the Best Contact Number below:  Patient/caller contact number:  patient /[phone]       or [phone]   [] Home  [x] Mobile  [] Work  []  Other   [x]  Okay to leave a voicemail   Medication List:  Current Outpatient Medications:  .  amLODIPine  (NORVASC ) 10 mg tablet, Take 1 tablet (10 mg total) by mouth daily., Disp: 90 tablet, Rfl: 0 .  aspirin  81 mg EC tablet, Take 81 mg by mouth Once Daily., Disp: , Rfl:  .  azelastine  (ASTELIN ) 137 mcg (0.1 %) nasal spray, Administer 2 sprays into each nostril 2 (two) times a day., Disp: 30 mL, Rfl: 2 .  empagliflozin  (JARDIANCE ) 25 mg tab, Take 1 tablet (25 mg total) by mouth daily., Disp: 90 tablet, Rfl: 3 .  ezetimibe  (ZETIA ) 10 mg tablet, Take 10 mg by mouth Once Daily., Disp: , Rfl:  .  fluticasone  propionate (FLONASE ) 50 mcg/spray nasal spray, Administer 2 sprays into affected nostril(s)., Disp: , Rfl:  .  FreeStyle Libre 2 Sensor kit, INJECT 1 DEVICE INTO THE SKIN EVERY 14 DAYS, Disp: , Rfl:  .  FreeStyle Libre 3 Plus Sensor, , Disp: , Rfl:  .  glipiZIDE  (GLUCOTROL  XL) 5 mg 24 hr tablet, Take one tablet (5 mg total) by mouth daily., Disp: 90 tablet, Rfl: 3 .  ipratropium (ATROVENT ) 42 mcg (0.06 %) spry nasal spray, Administer 1 spray into each nostril 2 (two) times a day. The patient can also use medication as needed before activities that cause the nose to run, like eating, Disp: 15 mL, Rfl: 1 .  latanoprost   (XALATAN ) 0.005 % ophthalmic solution, Administer 1 drop into each eyes nightly., Disp: 2.5 mL, Rfl: 11 .  losartan  (COZAAR ) 100 mg tablet, Take 1 tablet (100 mg total) by mouth daily., Disp: 90 tablet, Rfl: 1 .  metoprolol  succinate (TOPROL  XL) 100 mg 24 hr tablet, Take 1.5 tablets (150 mg total) by mouth daily., Disp: 135 tablet, Rfl: 0 .  rosuvastatin  (CRESTOR ) 20 mg tablet, Take 1 tablet (20 mg total) by mouth daily., Disp: 30 tablet, Rfl: 0 .  semaglutide  (Rybelsus ) 14 mg tab tablet, Take 1 tablet (14 mg total) by mouth daily before breakfast., Disp: 90 tablet, Rfl: 3 .  sildenafiL (VIAGRA) 100 mg tablet, TAKE 1 TABLET BY MOUTH EVERY 3 DAYS AS NEEDED APPROXIMATELY ONE HOUR BEFORE SEXUAL ACTIVITY, Disp: , Rfl:  .  tamsulosin  (FLOMAX ) 0.4 mg cap, Take 0.4 mg by mouth Once Daily., Disp: , Rfl:      Medication Request/Refills: Pharmacy Information (if applicable)   [x]  Not Applicable       []  Pharmacy listed  Send Medication Request to:                                                 []  Pharmacy not  listed (added to pharmacy list in Epic) Send Medication Request to:      Listed Pharmacies: [facility] - [facility] - [place], [place] - [address] AT [address] - PHONE: [phone] - FAX: [phone]

## 2024-01-21 ENCOUNTER — Other Ambulatory Visit (HOSPITAL_BASED_OUTPATIENT_CLINIC_OR_DEPARTMENT_OTHER): Payer: Self-pay

## 2024-01-21 NOTE — Telephone Encounter (Signed)
 Copied from CRM #[id number]. Topic: Clinical Concerns - Medical Question >> Jan 21, 2024  4:43 PM Drake BROCKS wrote: Dreshaun, Stene Jusu is calling other request    Include all details related to the request(s) below:  Patient is calling in requesting a wheelchair, I am having a very hard time understanding him. He states he had an mri on Saturday and is asking for a wheelchair and would like to get started with home health aide. Would like a call back    Confirm and type the Best Contact Number below:  Patient/caller contact number:          [phone]   [] Home  [x] Mobile  [] Work [] Other   [] Okay to leave a voicemail   Medication List:  Current Outpatient Medications:  .  amLODIPine  (NORVASC ) 10 mg tablet, Take 1 tablet (10 mg total) by mouth daily., Disp: 90 tablet, Rfl: 0 .  aspirin  81 mg EC tablet, Take 81 mg by mouth Once Daily., Disp: , Rfl:  .  azelastine  (ASTELIN ) 137 mcg (0.1 %) nasal spray, Administer 2 sprays into each nostril 2 (two) times a day., Disp: 30 mL, Rfl: 2 .  empagliflozin  (JARDIANCE ) 25 mg tab, Take 1 tablet (25 mg total) by mouth daily., Disp: 90 tablet, Rfl: 3 .  ezetimibe  (ZETIA ) 10 mg tablet, Take 10 mg by mouth Once Daily., Disp: , Rfl:  .  fluticasone  propionate (FLONASE ) 50 mcg/spray nasal spray, Administer 2 sprays into affected nostril(s)., Disp: , Rfl:  .  FreeStyle Libre 2 Sensor kit, INJECT 1 DEVICE INTO THE SKIN EVERY 14 DAYS, Disp: , Rfl:  .  FreeStyle Libre 3 Plus Sensor, , Disp: , Rfl:  .  glipiZIDE  (GLUCOTROL  XL) 5 mg 24 hr tablet, Take one tablet (5 mg total) by mouth daily., Disp: 90 tablet, Rfl: 3 .  ipratropium (ATROVENT ) 42 mcg (0.06 %) spry nasal spray, Administer 1 spray into each nostril 2 (two) times a day. The patient can also use medication as needed before activities that cause the nose to run, like eating, Disp: 15 mL, Rfl: 1 .  latanoprost  (XALATAN ) 0.005 % ophthalmic solution, Administer 1 drop into each eyes nightly., Disp: 2.5 mL,  Rfl: 11 .  losartan  (COZAAR ) 100 mg tablet, Take 1 tablet (100 mg total) by mouth daily., Disp: 90 tablet, Rfl: 1 .  metoprolol  succinate (TOPROL  XL) 100 mg 24 hr tablet, Take 1.5 tablets (150 mg total) by mouth daily., Disp: 135 tablet, Rfl: 0 .  rosuvastatin  (CRESTOR ) 20 mg tablet, Take 1 tablet (20 mg total) by mouth daily., Disp: 30 tablet, Rfl: 0 .  semaglutide  (Rybelsus ) 14 mg tab tablet, Take 1 tablet (14 mg total) by mouth daily before breakfast., Disp: 90 tablet, Rfl: 3 .  sildenafiL (VIAGRA) 100 mg tablet, TAKE 1 TABLET BY MOUTH EVERY 3 DAYS AS NEEDED APPROXIMATELY ONE HOUR BEFORE SEXUAL ACTIVITY, Disp: , Rfl:  .  tamsulosin  (FLOMAX ) 0.4 mg cap, Take 0.4 mg by mouth Once Daily., Disp: , Rfl:      Medication Request/Refills: Pharmacy Information (if applicable)   [] Not Applicable       []  Pharmacy listed  Send Medication Request to:                                                 [] Pharmacy not listed (added to pharmacy list in  Epic) Send Medication Request to:      Listed Pharmacies: [facility] - [facility] - [place], [place] - [address], [address] AT [address] - PHONE: [phone] - FAX: [phone]

## 2024-01-22 ENCOUNTER — Emergency Department (HOSPITAL_COMMUNITY): Admitting: Certified Registered Nurse Anesthetist

## 2024-01-22 ENCOUNTER — Other Ambulatory Visit: Payer: Self-pay

## 2024-01-22 ENCOUNTER — Encounter (HOSPITAL_BASED_OUTPATIENT_CLINIC_OR_DEPARTMENT_OTHER): Payer: Self-pay | Admitting: Emergency Medicine

## 2024-01-22 ENCOUNTER — Inpatient Hospital Stay (HOSPITAL_BASED_OUTPATIENT_CLINIC_OR_DEPARTMENT_OTHER)
Admission: EM | Admit: 2024-01-22 | Discharge: 2024-02-22 | DRG: 270 | Disposition: E | Attending: Internal Medicine | Admitting: Internal Medicine

## 2024-01-22 ENCOUNTER — Encounter (HOSPITAL_COMMUNITY): Admission: EM | Disposition: E | Payer: Self-pay | Source: Home / Self Care | Attending: Internal Medicine

## 2024-01-22 ENCOUNTER — Inpatient Hospital Stay (HOSPITAL_COMMUNITY)

## 2024-01-22 DIAGNOSIS — Z79899 Other long term (current) drug therapy: Secondary | ICD-10-CM

## 2024-01-22 DIAGNOSIS — I462 Cardiac arrest due to underlying cardiac condition: Secondary | ICD-10-CM | POA: Diagnosis not present

## 2024-01-22 DIAGNOSIS — I2510 Atherosclerotic heart disease of native coronary artery without angina pectoris: Secondary | ICD-10-CM

## 2024-01-22 DIAGNOSIS — K7200 Acute and subacute hepatic failure without coma: Secondary | ICD-10-CM | POA: Diagnosis not present

## 2024-01-22 DIAGNOSIS — Z833 Family history of diabetes mellitus: Secondary | ICD-10-CM

## 2024-01-22 DIAGNOSIS — D72829 Elevated white blood cell count, unspecified: Secondary | ICD-10-CM | POA: Diagnosis not present

## 2024-01-22 DIAGNOSIS — M79605 Pain in left leg: Secondary | ICD-10-CM | POA: Diagnosis present

## 2024-01-22 DIAGNOSIS — I4729 Other ventricular tachycardia: Secondary | ICD-10-CM | POA: Diagnosis not present

## 2024-01-22 DIAGNOSIS — I9788 Other intraoperative complications of the circulatory system, not elsewhere classified: Secondary | ICD-10-CM | POA: Diagnosis not present

## 2024-01-22 DIAGNOSIS — Z9079 Acquired absence of other genital organ(s): Secondary | ICD-10-CM

## 2024-01-22 DIAGNOSIS — E1122 Type 2 diabetes mellitus with diabetic chronic kidney disease: Secondary | ICD-10-CM | POA: Diagnosis present

## 2024-01-22 DIAGNOSIS — R001 Bradycardia, unspecified: Secondary | ICD-10-CM | POA: Diagnosis not present

## 2024-01-22 DIAGNOSIS — I959 Hypotension, unspecified: Secondary | ICD-10-CM

## 2024-01-22 DIAGNOSIS — Z85828 Personal history of other malignant neoplasm of skin: Secondary | ICD-10-CM

## 2024-01-22 DIAGNOSIS — I10 Essential (primary) hypertension: Secondary | ICD-10-CM | POA: Diagnosis not present

## 2024-01-22 DIAGNOSIS — I82412 Acute embolism and thrombosis of left femoral vein: Secondary | ICD-10-CM | POA: Diagnosis present

## 2024-01-22 DIAGNOSIS — M6282 Rhabdomyolysis: Secondary | ICD-10-CM | POA: Diagnosis not present

## 2024-01-22 DIAGNOSIS — E785 Hyperlipidemia, unspecified: Secondary | ICD-10-CM | POA: Diagnosis present

## 2024-01-22 DIAGNOSIS — Z800 Family history of malignant neoplasm of digestive organs: Secondary | ICD-10-CM

## 2024-01-22 DIAGNOSIS — I129 Hypertensive chronic kidney disease with stage 1 through stage 4 chronic kidney disease, or unspecified chronic kidney disease: Secondary | ICD-10-CM | POA: Diagnosis present

## 2024-01-22 DIAGNOSIS — E875 Hyperkalemia: Secondary | ICD-10-CM | POA: Diagnosis not present

## 2024-01-22 DIAGNOSIS — E1165 Type 2 diabetes mellitus with hyperglycemia: Secondary | ICD-10-CM | POA: Diagnosis present

## 2024-01-22 DIAGNOSIS — Y713 Surgical instruments, materials and cardiovascular devices (including sutures) associated with adverse incidents: Secondary | ICD-10-CM | POA: Diagnosis not present

## 2024-01-22 DIAGNOSIS — Z7982 Long term (current) use of aspirin: Secondary | ICD-10-CM

## 2024-01-22 DIAGNOSIS — E871 Hypo-osmolality and hyponatremia: Secondary | ICD-10-CM | POA: Diagnosis present

## 2024-01-22 DIAGNOSIS — I70222 Atherosclerosis of native arteries of extremities with rest pain, left leg: Secondary | ICD-10-CM | POA: Diagnosis present

## 2024-01-22 DIAGNOSIS — I998 Other disorder of circulatory system: Principal | ICD-10-CM

## 2024-01-22 DIAGNOSIS — G8911 Acute pain due to trauma: Secondary | ICD-10-CM | POA: Diagnosis not present

## 2024-01-22 DIAGNOSIS — Z87891 Personal history of nicotine dependence: Secondary | ICD-10-CM

## 2024-01-22 DIAGNOSIS — Z981 Arthrodesis status: Secondary | ICD-10-CM

## 2024-01-22 DIAGNOSIS — K219 Gastro-esophageal reflux disease without esophagitis: Secondary | ICD-10-CM | POA: Diagnosis present

## 2024-01-22 DIAGNOSIS — J449 Chronic obstructive pulmonary disease, unspecified: Secondary | ICD-10-CM

## 2024-01-22 DIAGNOSIS — Z7401 Bed confinement status: Secondary | ICD-10-CM | POA: Diagnosis not present

## 2024-01-22 DIAGNOSIS — N1832 Chronic kidney disease, stage 3b: Secondary | ICD-10-CM | POA: Diagnosis present

## 2024-01-22 DIAGNOSIS — H409 Unspecified glaucoma: Secondary | ICD-10-CM | POA: Diagnosis present

## 2024-01-22 DIAGNOSIS — I480 Paroxysmal atrial fibrillation: Secondary | ICD-10-CM | POA: Diagnosis present

## 2024-01-22 DIAGNOSIS — Y839 Surgical procedure, unspecified as the cause of abnormal reaction of the patient, or of later complication, without mention of misadventure at the time of the procedure: Secondary | ICD-10-CM | POA: Diagnosis not present

## 2024-01-22 DIAGNOSIS — E8721 Acute metabolic acidosis: Secondary | ICD-10-CM | POA: Diagnosis not present

## 2024-01-22 DIAGNOSIS — D573 Sickle-cell trait: Secondary | ICD-10-CM | POA: Diagnosis present

## 2024-01-22 DIAGNOSIS — E1152 Type 2 diabetes mellitus with diabetic peripheral angiopathy with gangrene: Principal | ICD-10-CM | POA: Diagnosis present

## 2024-01-22 DIAGNOSIS — N179 Acute kidney failure, unspecified: Secondary | ICD-10-CM | POA: Diagnosis present

## 2024-01-22 DIAGNOSIS — Z955 Presence of coronary angioplasty implant and graft: Secondary | ICD-10-CM

## 2024-01-22 DIAGNOSIS — N189 Chronic kidney disease, unspecified: Secondary | ICD-10-CM

## 2024-01-22 DIAGNOSIS — R579 Shock, unspecified: Secondary | ICD-10-CM | POA: Diagnosis not present

## 2024-01-22 DIAGNOSIS — Z8249 Family history of ischemic heart disease and other diseases of the circulatory system: Secondary | ICD-10-CM

## 2024-01-22 DIAGNOSIS — R6521 Severe sepsis with septic shock: Secondary | ICD-10-CM | POA: Diagnosis not present

## 2024-01-22 DIAGNOSIS — R911 Solitary pulmonary nodule: Secondary | ICD-10-CM | POA: Diagnosis present

## 2024-01-22 DIAGNOSIS — I97638 Postprocedural hematoma of a circulatory system organ or structure following other circulatory system procedure: Secondary | ICD-10-CM | POA: Diagnosis not present

## 2024-01-22 DIAGNOSIS — I472 Ventricular tachycardia, unspecified: Secondary | ICD-10-CM | POA: Diagnosis not present

## 2024-01-22 DIAGNOSIS — I4901 Ventricular fibrillation: Secondary | ICD-10-CM | POA: Diagnosis not present

## 2024-01-22 DIAGNOSIS — I743 Embolism and thrombosis of arteries of the lower extremities: Secondary | ICD-10-CM | POA: Diagnosis not present

## 2024-01-22 DIAGNOSIS — I4891 Unspecified atrial fibrillation: Secondary | ICD-10-CM

## 2024-01-22 DIAGNOSIS — A419 Sepsis, unspecified organism: Secondary | ICD-10-CM | POA: Diagnosis not present

## 2024-01-22 DIAGNOSIS — N400 Enlarged prostate without lower urinary tract symptoms: Secondary | ICD-10-CM | POA: Diagnosis present

## 2024-01-22 DIAGNOSIS — Z7951 Long term (current) use of inhaled steroids: Secondary | ICD-10-CM

## 2024-01-22 DIAGNOSIS — Z7984 Long term (current) use of oral hypoglycemic drugs: Secondary | ICD-10-CM

## 2024-01-22 DIAGNOSIS — Z8349 Family history of other endocrine, nutritional and metabolic diseases: Secondary | ICD-10-CM

## 2024-01-22 DIAGNOSIS — I70262 Atherosclerosis of native arteries of extremities with gangrene, left leg: Secondary | ICD-10-CM | POA: Diagnosis present

## 2024-01-22 DIAGNOSIS — M1611 Unilateral primary osteoarthritis, right hip: Secondary | ICD-10-CM | POA: Diagnosis present

## 2024-01-22 DIAGNOSIS — Z95828 Presence of other vascular implants and grafts: Secondary | ICD-10-CM

## 2024-01-22 DIAGNOSIS — R531 Weakness: Secondary | ICD-10-CM | POA: Diagnosis not present

## 2024-01-22 DIAGNOSIS — N35912 Unspecified bulbous urethral stricture, male: Secondary | ICD-10-CM | POA: Diagnosis present

## 2024-01-22 HISTORY — PX: INSERTION OF ILIAC STENT: SHX6256

## 2024-01-22 HISTORY — PX: FASCIECTOMY, LOWER EXTREMITY: SHX7334

## 2024-01-22 HISTORY — PX: THROMBECTOMY FEMORAL ARTERY: SHX6406

## 2024-01-22 HISTORY — PX: CYSTOSCOPY: SHX5120

## 2024-01-22 HISTORY — PX: INTRAOPERATIVE ARTERIOGRAM: SHX5157

## 2024-01-22 HISTORY — PX: ENDARTERECTOMY FEMORAL: SHX5804

## 2024-01-22 HISTORY — PX: PATCH ANGIOPLASTY: SHX6230

## 2024-01-22 LAB — CBC WITH DIFFERENTIAL/PLATELET
Abs Immature Granulocytes: 0.06 10*3/uL (ref 0.00–0.07)
Basophils Absolute: 0 10*3/uL (ref 0.0–0.1)
Basophils Relative: 0 %
Eosinophils Absolute: 0 10*3/uL (ref 0.0–0.5)
Eosinophils Relative: 0 %
HCT: 46.3 % (ref 39.0–52.0)
Hemoglobin: 16.1 g/dL (ref 13.0–17.0)
Immature Granulocytes: 0 %
Lymphocytes Relative: 7 %
Lymphs Abs: 1 10*3/uL (ref 0.7–4.0)
MCH: 26.7 pg (ref 26.0–34.0)
MCHC: 34.8 g/dL (ref 30.0–36.0)
MCV: 76.8 fL — ABNORMAL LOW (ref 80.0–100.0)
Monocytes Absolute: 0.8 10*3/uL (ref 0.1–1.0)
Monocytes Relative: 6 %
Neutro Abs: 13.1 10*3/uL — ABNORMAL HIGH (ref 1.7–7.7)
Neutrophils Relative %: 87 %
Platelets: 200 10*3/uL (ref 150–400)
RBC: 6.03 MIL/uL — ABNORMAL HIGH (ref 4.22–5.81)
RDW: 15.3 % (ref 11.5–15.5)
WBC: 15 10*3/uL — ABNORMAL HIGH (ref 4.0–10.5)
nRBC: 0 % (ref 0.0–0.2)

## 2024-01-22 LAB — COMPREHENSIVE METABOLIC PANEL WITH GFR
ALT: 67 U/L — ABNORMAL HIGH (ref 0–44)
AST: 171 U/L (ref 15–41)
Albumin: 3.8 g/dL (ref 3.5–5.0)
Alkaline Phosphatase: 91 U/L (ref 38–126)
Anion gap: 15 (ref 5–15)
BUN: 69 mg/dL — ABNORMAL HIGH (ref 8–23)
CO2: 19 mmol/L — ABNORMAL LOW (ref 22–32)
Calcium: 9.9 mg/dL (ref 8.9–10.3)
Chloride: 100 mmol/L (ref 98–111)
Creatinine, Ser: 3 mg/dL — ABNORMAL HIGH (ref 0.61–1.24)
GFR, Estimated: 21 mL/min — ABNORMAL LOW (ref >60)
Glucose, Bld: 324 mg/dL — ABNORMAL HIGH (ref 70–99)
Potassium: 4.6 mmol/L (ref 3.5–5.1)
Sodium: 134 mmol/L — ABNORMAL LOW (ref 135–145)
Total Bilirubin: 0.6 mg/dL (ref 0.0–1.2)
Total Protein: 7.5 g/dL (ref 6.5–8.1)

## 2024-01-22 LAB — SURGICAL PCR SCREEN
MRSA, PCR: NEGATIVE
Staphylococcus aureus: NEGATIVE

## 2024-01-22 LAB — HEMOGLOBIN A1C
Hgb A1c MFr Bld: 7.9 % — ABNORMAL HIGH (ref 4.8–5.6)
Mean Plasma Glucose: 180.03 mg/dL

## 2024-01-22 LAB — GLUCOSE, CAPILLARY
Glucose-Capillary: 138 mg/dL — ABNORMAL HIGH (ref 70–99)
Glucose-Capillary: 222 mg/dL — ABNORMAL HIGH (ref 70–99)
Glucose-Capillary: 66 mg/dL — ABNORMAL LOW (ref 70–99)
Glucose-Capillary: 88 mg/dL (ref 70–99)

## 2024-01-22 LAB — LACTIC ACID, PLASMA
Lactic Acid, Venous: 2.9 mmol/L (ref 0.5–1.9)
Lactic Acid, Venous: 1.4 mmol/L (ref 0.5–1.9)

## 2024-01-22 SURGERY — THROMBECTOMY, ARTERY, FEMORAL
Anesthesia: General | Site: Urethra

## 2024-01-22 MED ORDER — POLYETHYLENE GLYCOL 3350 17 G PO PACK: 17.0000 g | PACK | Freq: Every day | ORAL | Status: DC | PRN

## 2024-01-22 MED ORDER — PROPOFOL 10 MG/ML IV BOLUS
INTRAVENOUS | Status: DC | PRN
  Administered 2024-01-22: 100 mg via INTRAVENOUS

## 2024-01-22 MED ORDER — CEFAZOLIN SODIUM 1 G IJ SOLR
INTRAMUSCULAR | Status: AC
  Filled 2024-01-22: qty 20

## 2024-01-22 MED ORDER — INSULIN ASPART 100 UNIT/ML IJ SOLN: 0.0000 [IU] | Freq: Three times a day (TID) | INTRAMUSCULAR | Status: DC

## 2024-01-22 MED ORDER — NOREPINEPHRINE 4 MG/250ML-% IV SOLN
INTRAVENOUS | Status: DC | PRN
  Administered 2024-01-22: 4 ug/min via INTRAVENOUS

## 2024-01-22 MED ORDER — PHENYLEPHRINE HCL-NACL 20-0.9 MG/250ML-% IV SOLN
INTRAVENOUS | Status: DC | PRN
  Administered 2024-01-22: 50 ug/min via INTRAVENOUS

## 2024-01-22 MED ORDER — POTASSIUM CHLORIDE CRYS ER 20 MEQ PO TBCR: 40.0000 meq | EXTENDED_RELEASE_TABLET | Freq: Every day | ORAL | Status: DC | PRN

## 2024-01-22 MED ORDER — FENTANYL CITRATE (PF) 250 MCG/5ML IJ SOLN
INTRAMUSCULAR | Status: DC | PRN
  Administered 2024-01-22: 100 ug via INTRAVENOUS
  Administered 2024-01-22: 50 ug via INTRAVENOUS

## 2024-01-22 MED ORDER — PROPOFOL 10 MG/ML IV BOLUS
INTRAVENOUS | Status: AC
Start: 2024-01-22 — End: 2024-01-22
  Filled 2024-01-22: qty 20

## 2024-01-22 MED ORDER — CEFAZOLIN SODIUM-DEXTROSE 2-4 GM/100ML-% IV SOLN
2.0000 g | Freq: Three times a day (TID) | INTRAVENOUS | Status: AC
  Administered 2024-01-22 – 2024-01-23 (×2): 2 g via INTRAVENOUS
  Filled 2024-01-22 (×2): qty 100

## 2024-01-22 MED ORDER — INSULIN ASPART 100 UNIT/ML IJ SOLN
INTRAMUSCULAR | Status: DC | PRN
  Administered 2024-01-22: 6 [IU] via SUBCUTANEOUS

## 2024-01-22 MED ORDER — SUCCINYLCHOLINE CHLORIDE 200 MG/10ML IV SOSY
PREFILLED_SYRINGE | INTRAVENOUS | Status: DC | PRN
  Administered 2024-01-22: 100 mg via INTRAVENOUS

## 2024-01-22 MED ORDER — MORPHINE SULFATE (PF) 4 MG/ML IV SOLN
4.0000 mg | Freq: Once | INTRAVENOUS | Status: AC
  Administered 2024-01-22: 4 mg via INTRAVENOUS
  Filled 2024-01-22: qty 1

## 2024-01-22 MED ORDER — CHLORHEXIDINE GLUCONATE 0.12 % MT SOLN
15.0000 mL | Freq: Once | OROMUCOSAL | Status: AC
  Administered 2024-01-22: 15 mL via OROMUCOSAL
  Filled 2024-01-22 (×2): qty 15

## 2024-01-22 MED ORDER — FENTANYL CITRATE (PF) 100 MCG/2ML IJ SOLN: 25.0000 ug | INTRAMUSCULAR | Status: DC | PRN

## 2024-01-22 MED ORDER — AMLODIPINE BESYLATE 10 MG PO TABS: 10.0000 mg | ORAL_TABLET | Freq: Every day | ORAL | Status: DC

## 2024-01-22 MED ORDER — SODIUM CHLORIDE 0.9 % IV SOLN: 500.0000 mL | Freq: Once | INTRAVENOUS | Status: DC | PRN

## 2024-01-22 MED ORDER — ALBUMIN HUMAN 5 % IV SOLN: INTRAVENOUS | Status: DC | PRN

## 2024-01-22 MED ORDER — PHENYLEPHRINE 80 MCG/ML (10ML) SYRINGE FOR IV PUSH (FOR BLOOD PRESSURE SUPPORT)
PREFILLED_SYRINGE | INTRAVENOUS | Status: DC | PRN
  Administered 2024-01-22: 320 ug via INTRAVENOUS
  Administered 2024-01-22: 240 ug via INTRAVENOUS

## 2024-01-22 MED ORDER — BUDESONIDE 0.5 MG/2ML IN SUSP
0.5000 mg | Freq: Two times a day (BID) | RESPIRATORY_TRACT | Status: DC
  Administered 2024-01-23 – 2024-01-24 (×2): 0.5 mg via RESPIRATORY_TRACT
  Filled 2024-01-22 (×2): qty 2

## 2024-01-22 MED ORDER — ROSUVASTATIN CALCIUM 20 MG PO TABS
40.0000 mg | ORAL_TABLET | Freq: Every day | ORAL | Status: DC
  Administered 2024-01-23: 40 mg via ORAL
  Filled 2024-01-22: qty 2

## 2024-01-22 MED ORDER — PHENOL 1.4 % MT LIQD: 1.0000 | OROMUCOSAL | Status: DC | PRN

## 2024-01-22 MED ORDER — ARFORMOTEROL TARTRATE 15 MCG/2ML IN NEBU
15.0000 ug | INHALATION_SOLUTION | Freq: Two times a day (BID) | RESPIRATORY_TRACT | Status: DC
  Administered 2024-01-23 – 2024-01-24 (×2): 15 ug via RESPIRATORY_TRACT
  Filled 2024-01-22 (×2): qty 2

## 2024-01-22 MED ORDER — HEPARIN BOLUS VIA INFUSION
3700.0000 [IU] | Freq: Once | INTRAVENOUS | Status: AC
  Administered 2024-01-22: 3700 [IU] via INTRAVENOUS

## 2024-01-22 MED ORDER — OXYCODONE HCL 5 MG/5ML PO SOLN: 5.0000 mg | Freq: Once | ORAL | Status: DC | PRN

## 2024-01-22 MED ORDER — DULOXETINE HCL 30 MG PO CPEP
30.0000 mg | ORAL_CAPSULE | Freq: Every day | ORAL | Status: DC
  Administered 2024-01-23 – 2024-01-24 (×2): 30 mg via ORAL
  Filled 2024-01-22 (×2): qty 1

## 2024-01-22 MED ORDER — LACTATED RINGERS IV SOLN: INTRAVENOUS | Status: DC

## 2024-01-22 MED ORDER — SUGAMMADEX SODIUM 200 MG/2ML IV SOLN
INTRAVENOUS | Status: DC | PRN
  Administered 2024-01-22: 123.4 mg via INTRAVENOUS

## 2024-01-22 MED ORDER — HEPARIN SODIUM (PORCINE) 1000 UNIT/ML IJ SOLN
INTRAMUSCULAR | Status: DC | PRN
  Administered 2024-01-22: 3500 [IU] via INTRAVENOUS
  Administered 2024-01-22: 5000 [IU] via INTRAVENOUS

## 2024-01-22 MED ORDER — DOCUSATE SODIUM 100 MG PO CAPS
100.0000 mg | ORAL_CAPSULE | Freq: Every day | ORAL | Status: DC
  Administered 2024-01-23 – 2024-01-24 (×2): 100 mg via ORAL
  Filled 2024-01-22 (×2): qty 1

## 2024-01-22 MED ORDER — HEPARIN 6000 UNIT IRRIGATION SOLUTION
Status: DC | PRN
  Administered 2024-01-22: 1

## 2024-01-22 MED ORDER — DEXTROSE 5 % IV SOLN: INTRAVENOUS | Status: DC

## 2024-01-22 MED ORDER — LACTATED RINGERS IV SOLN: INTRAVENOUS | Status: DC | PRN

## 2024-01-22 MED ORDER — ONDANSETRON HCL 4 MG/2ML IJ SOLN
4.0000 mg | Freq: Four times a day (QID) | INTRAMUSCULAR | Status: DC | PRN
Start: 2024-01-22 — End: 2024-02-11

## 2024-01-22 MED ORDER — LABETALOL HCL 5 MG/ML IV SOLN: 10.0000 mg | INTRAVENOUS | Status: DC | PRN

## 2024-01-22 MED ORDER — ACETAMINOPHEN 10 MG/ML IV SOLN: 1000.0000 mg | Freq: Once | INTRAVENOUS | Status: DC | PRN

## 2024-01-22 MED ORDER — LOSARTAN POTASSIUM 50 MG PO TABS: 100.0000 mg | ORAL_TABLET | Freq: Every day | ORAL | Status: DC

## 2024-01-22 MED ORDER — IPRATROPIUM BROMIDE 0.02 % IN SOLN
0.5000 mg | Freq: Four times a day (QID) | RESPIRATORY_TRACT | Status: DC
  Administered 2024-01-23 – 2024-01-24 (×3): 0.5 mg via RESPIRATORY_TRACT
  Filled 2024-01-22 (×4): qty 2.5

## 2024-01-22 MED ORDER — ACETAMINOPHEN 325 MG PO TABS: 325.0000 mg | ORAL_TABLET | ORAL | Status: DC | PRN

## 2024-01-22 MED ORDER — 0.9 % SODIUM CHLORIDE (POUR BTL) OPTIME
TOPICAL | Status: DC | PRN
  Administered 2024-01-22: 2000 mL

## 2024-01-22 MED ORDER — ONDANSETRON HCL 4 MG/2ML IJ SOLN
INTRAMUSCULAR | Status: DC | PRN
  Administered 2024-01-22: 4 mg via INTRAVENOUS

## 2024-01-22 MED ORDER — INSULIN ASPART 100 UNIT/ML IJ SOLN
0.0000 [IU] | INTRAMUSCULAR | Status: DC
  Administered 2024-01-23: 3 [IU] via SUBCUTANEOUS
  Administered 2024-01-23: 2 [IU] via SUBCUTANEOUS
  Administered 2024-01-23: 3 [IU] via SUBCUTANEOUS
  Administered 2024-01-23 (×2): 2 [IU] via SUBCUTANEOUS
  Administered 2024-01-24 (×2): 3 [IU] via SUBCUTANEOUS

## 2024-01-22 MED ORDER — CHLORHEXIDINE GLUCONATE CLOTH 2 % EX PADS
6.0000 | MEDICATED_PAD | Freq: Every day | CUTANEOUS | Status: DC
  Administered 2024-01-22: 6 via TOPICAL

## 2024-01-22 MED ORDER — ROCURONIUM BROMIDE 10 MG/ML (PF) SYRINGE
PREFILLED_SYRINGE | INTRAVENOUS | Status: DC | PRN
  Administered 2024-01-22: 10 mg via INTRAVENOUS
  Administered 2024-01-22: 90 mg via INTRAVENOUS

## 2024-01-22 MED ORDER — EPHEDRINE SULFATE-NACL 50-0.9 MG/10ML-% IV SOSY
PREFILLED_SYRINGE | INTRAVENOUS | Status: DC | PRN
  Administered 2024-01-22 (×5): 10 mg via INTRAVENOUS
  Administered 2024-01-22 (×3): 15 mg via INTRAVENOUS

## 2024-01-22 MED ORDER — ACETAMINOPHEN 325 MG RE SUPP
325.0000 mg | RECTAL | Status: DC | PRN
  Filled 2024-01-22: qty 2

## 2024-01-22 MED ORDER — HEPARIN 6000 UNIT IRRIGATION SOLUTION
Status: AC
  Filled 2024-01-22: qty 500

## 2024-01-22 MED ORDER — DEXTROSE 50 % IV SOLN
INTRAVENOUS | Status: AC
  Filled 2024-01-22: qty 50

## 2024-01-22 MED ORDER — HEPARIN (PORCINE) 25000 UT/250ML-% IV SOLN
1000.0000 [IU]/h | INTRAVENOUS | Status: DC
  Administered 2024-01-22: 1000 [IU]/h via INTRAVENOUS
  Filled 2024-01-22: qty 250

## 2024-01-22 MED ORDER — EZETIMIBE 10 MG PO TABS
10.0000 mg | ORAL_TABLET | Freq: Every day | ORAL | Status: DC
  Administered 2024-01-23 – 2024-01-24 (×2): 10 mg via ORAL
  Filled 2024-01-22 (×2): qty 1

## 2024-01-22 MED ORDER — METOPROLOL TARTRATE 5 MG/5ML IV SOLN: 2.5000 mg | INTRAVENOUS | Status: DC | PRN

## 2024-01-22 MED ORDER — ONDANSETRON HCL 4 MG/2ML IJ SOLN
4.0000 mg | Freq: Once | INTRAMUSCULAR | Status: AC
  Administered 2024-01-22: 4 mg via INTRAVENOUS
  Filled 2024-01-22: qty 2

## 2024-01-22 MED ORDER — ORAL CARE MOUTH RINSE: 15.0000 mL | OROMUCOSAL | Status: DC | PRN

## 2024-01-22 MED ORDER — METOPROLOL SUCCINATE ER 50 MG PO TB24: 150.0000 mg | ORAL_TABLET | Freq: Every day | ORAL | Status: DC

## 2024-01-22 MED ORDER — HYDRALAZINE HCL 20 MG/ML IJ SOLN: 5.0000 mg | INTRAMUSCULAR | Status: DC | PRN

## 2024-01-22 MED ORDER — OXYCODONE-ACETAMINOPHEN 5-325 MG PO TABS
1.0000 | ORAL_TABLET | ORAL | Status: DC | PRN
  Administered 2024-01-23 (×2): 1 via ORAL
  Administered 2024-01-24: 2 via ORAL
  Filled 2024-01-22: qty 1
  Filled 2024-01-22: qty 2
  Filled 2024-01-22: qty 1

## 2024-01-22 MED ORDER — VASOPRESSIN 20 UNIT/ML IV SOLN
INTRAVENOUS | Status: AC
  Filled 2024-01-22: qty 1

## 2024-01-22 MED ORDER — MORPHINE SULFATE (PF) 2 MG/ML IV SOLN
2.0000 mg | INTRAVENOUS | Status: DC | PRN
  Administered 2024-01-23: 4 mg via INTRAVENOUS
  Filled 2024-01-22: qty 2

## 2024-01-22 MED ORDER — ORAL CARE MOUTH RINSE: 15.0000 mL | Freq: Once | OROMUCOSAL | Status: AC

## 2024-01-22 MED ORDER — FENTANYL CITRATE (PF) 250 MCG/5ML IJ SOLN
INTRAMUSCULAR | Status: AC
  Filled 2024-01-22: qty 5

## 2024-01-22 MED ORDER — DEXTROSE 50 % IV SOLN
12.5000 g | INTRAVENOUS | Status: AC
  Administered 2024-01-22: 12.5 g via INTRAVENOUS

## 2024-01-22 MED ORDER — OXYCODONE HCL 5 MG PO TABS: 5.0000 mg | ORAL_TABLET | Freq: Once | ORAL | Status: DC | PRN

## 2024-01-22 MED ORDER — DROPERIDOL 2.5 MG/ML IJ SOLN: 0.6250 mg | Freq: Once | INTRAMUSCULAR | Status: DC | PRN

## 2024-01-22 MED ORDER — CEFAZOLIN SODIUM-DEXTROSE 2-3 GM-%(50ML) IV SOLR
INTRAVENOUS | Status: DC | PRN
Start: 2024-01-22 — End: 2024-01-22
  Administered 2024-01-22: 2 g via INTRAVENOUS

## 2024-01-22 MED ORDER — LIDOCAINE 2% (20 MG/ML) 5 ML SYRINGE
INTRAMUSCULAR | Status: DC | PRN
  Administered 2024-01-22: 80 mg via INTRAVENOUS

## 2024-01-22 MED ORDER — ASPIRIN 81 MG PO TBEC
81.0000 mg | DELAYED_RELEASE_TABLET | Freq: Every day | ORAL | Status: DC
  Administered 2024-01-23 – 2024-01-24 (×2): 81 mg via ORAL
  Filled 2024-01-22 (×2): qty 1

## 2024-01-22 SURGICAL SUPPLY — 64 items
BAG COUNTER SPONGE SURGICOUNT (BAG) ×4 IMPLANT
BALLOON MUSTANG 7X80X75 (BALLOONS) IMPLANT
BANDAGE ESMARK 6X9 LF (GAUZE/BANDAGES/DRESSINGS) IMPLANT
BNDG GAUZE DERMACEA FLUFF 4 (GAUZE/BANDAGES/DRESSINGS) IMPLANT
CANISTER SUCTION 3000ML PPV (SUCTIONS) ×4 IMPLANT
CANNULA VESSEL 3MM 2 BLNT TIP (CANNULA) ×4 IMPLANT
CATH ANGIO 5F BER2 65CM (CATHETERS) IMPLANT
CATH BALLOON NEPH ULTRXX 8X125 (BALLOONS) IMPLANT
CATH COUDE FOLEY 5CC 14FR (CATHETERS) IMPLANT
CATH EMB 4FR 40 (CATHETERS) IMPLANT
CATH EMB 4FR 80 (CATHETERS) IMPLANT
CATH EMB 5FR 80CM (CATHETERS) IMPLANT
CATH FOLEY 2W COUNCIL 5CC 16FR (CATHETERS) IMPLANT
CATH FOLEY 2WAY SLVR 5CC 12FR (CATHETERS) IMPLANT
CLIP TI MEDIUM 24 (CLIP) ×4 IMPLANT
CLIP TI WIDE RED SMALL 24 (CLIP) ×4 IMPLANT
CUFF TOURN SGL QUICK 42 (TOURNIQUET CUFF) IMPLANT
CUFF TRNQT CYL 24X4X16.5-23 (TOURNIQUET CUFF) IMPLANT
CUFF TRNQT CYL 34X4.125X (TOURNIQUET CUFF) IMPLANT
DERMABOND ADVANCED .7 DNX12 (GAUZE/BANDAGES/DRESSINGS) ×4 IMPLANT
DEVICE INFLATION SPHERE 20ML (MISCELLANEOUS) IMPLANT
DRAPE HALF SHEET 40X57 (DRAPES) IMPLANT
DRAPE X-RAY CASS 24X20 (DRAPES) IMPLANT
ELECTRODE REM PT RTRN 9FT ADLT (ELECTROSURGICAL) ×4 IMPLANT
EVACUATOR SILICONE 100CC (DRAIN) IMPLANT
GAUZE PAD ABD 8X10 STRL (GAUZE/BANDAGES/DRESSINGS) IMPLANT
GLIDEWIRE ADV .035X260CM (WIRE) IMPLANT
GLOVE SURG SS PI 7.5 STRL IVOR (GLOVE) ×12 IMPLANT
GOWN STRL REUS W/ TWL LRG LVL3 (GOWN DISPOSABLE) ×8 IMPLANT
GOWN STRL REUS W/ TWL XL LVL3 (GOWN DISPOSABLE) ×4 IMPLANT
GRAFT VASC PATCH XENOSURE 1X14 (Vascular Products) IMPLANT
GUIDEWIRE STR DUAL SENSOR (WIRE) IMPLANT
HEMOSTAT SNOW SURGICEL 2X4 (HEMOSTASIS) IMPLANT
INSERT FOGARTY SM (MISCELLANEOUS) IMPLANT
KIT BASIN OR (CUSTOM PROCEDURE TRAY) ×4 IMPLANT
KIT ENCORE 26 ADVANTAGE (KITS) IMPLANT
KIT TURNOVER KIT B (KITS) ×4 IMPLANT
MARKER GRAFT CORONARY BYPASS (MISCELLANEOUS) IMPLANT
NS IRRIG 1000ML POUR BTL (IV SOLUTION) ×8 IMPLANT
PACK PERIPHERAL VASCULAR (CUSTOM PROCEDURE TRAY) ×4 IMPLANT
PAD ARMBOARD POSITIONER FOAM (MISCELLANEOUS) ×8 IMPLANT
SET COLLECT BLD 21X3/4 12 (NEEDLE) IMPLANT
SET IRRIG Y TYPE TUR BLADDER L (SET/KITS/TRAYS/PACK) IMPLANT
SHEATH BRITE TIP 8FR 35CM (SHEATH) IMPLANT
STENT INNOVA 8X100X130 (Permanent Stent) IMPLANT
STENT VIABAHN 8X79 8FR 135 (Permanent Stent) IMPLANT
STOPCOCK 4 WAY LG BORE MALE ST (IV SETS) IMPLANT
SURGIFLO W/THROMBIN 8M KIT (HEMOSTASIS) IMPLANT
SUT ETHILON 3 0 PS 1 (SUTURE) IMPLANT
SUT GORETEX 6.0 TT9 (SUTURE) IMPLANT
SUT PROLENE 5 0 C 1 24 (SUTURE) ×4 IMPLANT
SUT PROLENE 5 0 C 1 36 (SUTURE) IMPLANT
SUT PROLENE 6 0 BV (SUTURE) ×4 IMPLANT
SUT PROLENE 7 0 BV 1 (SUTURE) IMPLANT
SUT SILK 2 0 SH (SUTURE) ×4 IMPLANT
SUT SILK 3-0 18XBRD TIE 12 (SUTURE) IMPLANT
SUT VIC AB 2-0 CT1 TAPERPNT 27 (SUTURE) ×8 IMPLANT
SUT VIC AB 3-0 SH 27X BRD (SUTURE) ×8 IMPLANT
SUT VIC AB 4-0 PS2 18 (SUTURE) ×8 IMPLANT
TOWEL GREEN STERILE (TOWEL DISPOSABLE) ×4 IMPLANT
TRAY FOLEY MTR SLVR 16FR STAT (SET/KITS/TRAYS/PACK) ×4 IMPLANT
TUBING EXTENTION W/L.L. (IV SETS) IMPLANT
UNDERPAD 30X36 HEAVY ABSORB (UNDERPADS AND DIAPERS) ×4 IMPLANT
WATER STERILE IRR 1000ML POUR (IV SOLUTION) ×4 IMPLANT

## 2024-01-22 NOTE — Anesthesia Preprocedure Evaluation (Addendum)
 Anesthesia Evaluation  Patient identified by MRN, date of birth, ID band Patient awake    Reviewed: Allergy & Precautions, NPO status , Patient's Chart, lab work & pertinent test results  History of Anesthesia Complications Negative for: history of anesthetic complications  Airway Mallampati: II  TM Distance: >3 FB Neck ROM: Full    Dental  (+) Missing, Edentulous Lower,    Pulmonary COPD,  COPD inhaler, former smoker   Pulmonary exam normal        Cardiovascular hypertension, Pt. on medications and Pt. on home beta blockers + CAD and + Peripheral Vascular Disease  Normal cardiovascular exam  Echo:  1. Left ventricular ejection fraction, by estimation, is 55 to 60%. Left  ventricular ejection fraction by 3D volume is 54 %. The left ventricle has  normal function. The left ventricle has no regional wall motion  abnormalities. There is moderate  concentric left ventricular hypertrophy. Left ventricular diastolic  parameters are indeterminate.   2. Right ventricular systolic function is normal. The right ventricular  size is normal. There is normal pulmonary artery systolic pressure. The  estimated right ventricular systolic pressure is 26.6 mmHg.   3. The mitral valve is grossly normal. Mild mitral valve regurgitation.  No evidence of mitral stenosis.   4. The aortic valve is tricuspid. Aortic valve regurgitation is trivial.  No aortic stenosis is present.   5. The inferior vena cava is normal in size with greater than 50%  respiratory variability, suggesting right atrial pressure of 3 mmHg.     Neuro/Psych  Neuromuscular disease  negative psych ROS   GI/Hepatic Neg liver ROS,GERD  Controlled,,  Endo/Other  diabetes, Type 2, Oral Hypoglycemic Agents    Renal/GU Renal disease     Musculoskeletal  (+) Arthritis ,    Abdominal   Peds  Hematology  (+) Blood dyscrasia, Sickle cell anemia   Anesthesia Other  Findings   Reproductive/Obstetrics                             Anesthesia Physical Anesthesia Plan  ASA: 3 and emergent  Anesthesia Plan: General   Post-op Pain Management: Tylenol  PO (pre-op)*   Induction: Intravenous  PONV Risk Score and Plan: 3 and Ondansetron , Dexamethasone  and Midazolam   Airway Management Planned: Oral ETT  Additional Equipment: Arterial line, CVP and Ultrasound Guidance Line Placement  Intra-op Plan:   Post-operative Plan: Possible Post-op intubation/ventilation  Informed Consent: I have reviewed the patients History and Physical, chart, labs and discussed the procedure including the risks, benefits and alternatives for the proposed anesthesia with the patient or authorized representative who has indicated his/her understanding and acceptance.     Dental advisory given  Plan Discussed with: CRNA  Anesthesia Plan Comments: (Lab Results      Component                Value               Date                      WBC                      15.0 (H)            01/22/2024                HGB  16.1                01/22/2024                HCT                      46.3                01/22/2024                MCV                      76.8 (L)            01/22/2024                PLT                      200                 01/22/2024           )       Anesthesia Quick Evaluation

## 2024-01-22 NOTE — Telephone Encounter (Signed)
 Pt wife has been informed of this. Montie states she is on her way to take him to the hospital I advised she contact us  back so that we can get him scheduled for Hospital f/u and DME/HH orders

## 2024-01-22 NOTE — Progress Notes (Signed)
 Vascular surgery update.  Patient with slight hematoma in the left groin, soft, non-expanding. Will plan to hold off on any heparin  tonight Heparin  drip discontinued

## 2024-01-22 NOTE — ED Notes (Signed)
 Called [facility] for transport for emergency traffic Hot per Dr. Emil.  Spoke with Zachary

## 2024-01-22 NOTE — Anesthesia Procedure Notes (Signed)
 Procedure Name: Intubation Date/Time: 01/22/2024 4:05 PM  Performed by: Zelphia Norleen HERO, CRNAPre-anesthesia Checklist: Patient identified, Emergency Drugs available, Suction available and Patient being monitored Patient Re-evaluated:Patient Re-evaluated prior to induction Oxygen Delivery Method: Circle system utilized Preoxygenation: Pre-oxygenation with 100% oxygen Induction Type: IV induction Ventilation: Mask ventilation without difficulty Laryngoscope Size: Mac and 3 Grade View: Grade I Tube type: Oral Tube size: 7.0 mm Number of attempts: 1 Airway Equipment and Method: Stylet and Oral airway Placement Confirmation: ETT inserted through vocal cords under direct vision, positive ETCO2 and breath sounds checked- equal and bilateral Secured at: 23 cm Tube secured with: Tape Dental Injury: Teeth and Oropharynx as per pre-operative assessment

## 2024-01-22 NOTE — ED Notes (Signed)
 Pt bib [facility] from [facility]. Carelink states unable to feel femoral pulse/swelling and discoloration to left lower extremity. Pt denies chest pain/shortness of breath. GCS 15. EDP at bedside.

## 2024-01-22 NOTE — ED Provider Notes (Signed)
  Physical Exam  BP 139/74   Pulse 62   Temp 97.9 F (36.6 C) (Oral)   Resp (!) 24   Ht 5' 5 (1.651 m)   Wt 61.7 kg   SpO2 100%   BMI 22.63 kg/m   Physical Exam  Procedures  Procedures  ED Course / MDM    Medical Decision Making Amount and/or Complexity of Data Reviewed Labs: ordered.  Risk Prescription drug management.   Patient coming from med [facility].  Concerns for ischemic leg.  Patient arrived in the ED, I felt leg, felt leg for pulse and was unable to find a pulse.  Leg is cold.  This is ischemic leg.  Paged vascular surgery, Dr. Pearline, who called back immediately.  He is coming and evaluate the patient.  Bedside ultrasound shows what appears to be a thrombus in the patient's femoral vein.  Unclear how far it extends.  Patient unable to move lower extremity.    Mannie Pac T, DO 01/22/24 1430

## 2024-01-22 NOTE — ED Notes (Signed)
 Report given to [facility]link.

## 2024-01-22 NOTE — ED Provider Notes (Addendum)
 I have personally seen and examined the patient.  I have discussed the plan of care with the resident.  I have reviewed the documentation on PMH/FH/Soc. History.  I have reviewed the documentation of the resident and agree.  Patient here with about a week of left leg pain and swelling.  Patient was seen in orthopedics and had a recent MRI.  He had had worsening pain and now has weakness to his leg as well and had to come to the ED to be evaluated.  On exam he has a cold and pulseless left lower extremity.  He has significant pain to the posterior aspect of the left knee.  His skin is more warm about mid thigh.  I do not appreciate an obvious left femoral pulse.  His right femoral pulse is bounding.  He does have some signs of blistering which I think are due to the prolonged course.  I discussed the case with Dr. Pearline, vascular surgery.  With the patient having new decreased function recommended starting heparin  and sending the patient to the [facility] ED to be acutely evaluated.  I discussed case with Dr. Melvenia who accept the patient in ED transfer.   EKG Interpretation Date/Time:    Ventricular Rate:    PR Interval:    QRS Duration:    QT Interval:    QTC Calculation:   R Axis:      Text Interpretation:         CRITICAL CARE Performed by: Toribio Belvie Quale   Total critical care time: 35 minutes  Critical care time was exclusive of separately billable procedures and treating other patients.  Critical care was necessary to treat or prevent imminent or life-threatening deterioration.  Critical care was time spent personally by me on the following activities: development of treatment plan with patient and/or surrogate as well as nursing, discussions with consultants, evaluation of patient's response to treatment, examination of patient, obtaining history from patient or surrogate, ordering and performing treatments and interventions, ordering and review of laboratory studies,  ordering and review of radiographic studies, pulse oximetry and re-evaluation of patient's condition.    Quale Share, DO 01/22/24 1404    Quale Share, DO 01/22/24 1407

## 2024-01-22 NOTE — ED Notes (Signed)
 Report called to Ozell QUAIL at [facility] ED .

## 2024-01-22 NOTE — Progress Notes (Signed)
 PHARMACY - ANTICOAGULATION CONSULT NOTE  Pharmacy Consult for heparin   Indication: limb ischemia  No Known Allergies  Patient Measurements: Height: 5' 5 (165.1 cm) Weight: 61.7 kg (136 lb) IBW/kg (Calculated) : 61.5 HEPARIN  DW (KG): 61.7  Vital Signs: Temp: 97.9 F (36.6 C) (07/01 1202) Temp Source: Oral (07/01 1202) BP: 120/82 (07/01 1202) Pulse Rate: 106 (07/01 1202)  Labs: Recent Labs    01/22/24 1331  HGB 16.1  HCT 46.3  PLT 200    CrCl cannot be calculated (Patient's most recent lab result is older than the maximum 21 days allowed.).   Medical History: Past Medical History:  Diagnosis Date   Allergy    Arthritis    Right Hip   Atrial fibrillation ([name])    BCC (basal cell carcinoma of skin)    BPH (benign prostatic hyperplasia)    COPD (chronic obstructive pulmonary disease) ([name])    Coronary artery disease    Lexiscan  Myoview 7/14:  Inf scar with minimal reversibility, inf HK, EF 45%;  Low risk.  Promus LAD stent placed in [place].  100% RCA   Diabetes 1.5, managed as type 1 ([name])    Diverticulosis    on CT   Dyslipidemia    GERD (gastroesophageal reflux disease)    has used Zantac in the past    Glaucoma    Hammer toes, bilateral    Hernia    Hyperlipidemia    Hypertension    Peripheral vascular disease ([name])    a. s/p R fem-pop BPG 01/2013 (Dr. 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 Concern for an ischemic limb so starting IV heparin . Baseline Hgb and plts are WNL. He is not on anticoagulation PTA.   Goal of Therapy:  Heparin  level 0.3-0.7 units/ml Monitor platelets by anticoagulation protocol: Yes   Plan:  Heparin  bolus 3700 units IV x 1 Heparin  gtt 1000 units/hr Check an 8 hr heparin  level Daily heparin  level and CBC  Winfield Caba, Vernell Helling 01/22/2024,1:55  PM

## 2024-01-22 NOTE — Anesthesia Procedure Notes (Signed)
 Central Venous Catheter Insertion Performed by: Paul Lamarr BRAVO, MD, anesthesiologist Start/End7/07/2023 4:08 PM, 01/22/2024 4:16 PM Patient location: OR. Preanesthetic checklist: patient identified, IV checked, risks and benefits discussed, surgical consent, monitors and equipment checked, pre-op evaluation, timeout performed and anesthesia consent Position: Trendelenburg Patient sedated Hand hygiene performed , maximum sterile barriers used  and Seldinger technique used Catheter size: 8 Fr Total catheter length 16. Central line was placed.Double lumen Procedure performed using ultrasound guided technique. Ultrasound Notes:anatomy identified, needle tip was noted to be adjacent to the nerve/plexus identified, no ultrasound evidence of intravascular and/or intraneural injection and image(s) printed for medical record Attempts: 1 Following insertion, dressing applied, line sutured and Biopatch. Post procedure assessment: blood return through all ports  Patient tolerated the procedure well with no immediate complications.

## 2024-01-22 NOTE — Consult Note (Addendum)
 Hospital Consult    Reason for Consult: Acute limb ischemia Requesting Physician: ED MRN #:  [id number]  History of Present Illness: This is a 78 y.o. male with a PMH of a-fib, CAD, COPD, HLD, HTN, and PAD s/p right femoropopliteal bypass in 2014 who presents to the ED this afternoon with concerns for an ischemic left lower extremity.  Upon examination with the patient, he states he started having significant rest pain in his left lower extremity about 2 weeks ago.  His pain has significantly worsened over time.  This has progressed from waking him up at night to constant pain throughout the day.  He has also noticed that his left leg has gotten more cold over time.  He says that he stopped being able to feel his left foot about a week ago.  He stopped being able to move his left foot a couple days ago.  Since then he has mostly been bedbound.  Past Medical History:  Diagnosis Date   Allergy    Arthritis    Right Hip   Atrial fibrillation ([name])    BCC (basal cell carcinoma of skin)    BPH (benign prostatic hyperplasia)    COPD (chronic obstructive pulmonary disease) ([name])    Coronary artery disease    Lexiscan  Myoview 7/14:  Inf scar with minimal reversibility, inf HK, EF 45%;  Low risk.  Promus LAD stent placed in [place].  100% RCA   Diabetes 1.5, managed as type 1 ([name])    Diverticulosis    on CT   Dyslipidemia    GERD (gastroesophageal reflux disease)    has used Zantac in the past    Glaucoma    Hammer toes, bilateral    Hernia    Hyperlipidemia    Hypertension    Peripheral vascular disease ([name])    a. s/p R fem-pop BPG 01/2013 (Dr. 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    Past Surgical History:  Procedure Laterality Date   ABDOMINAL AORTAGRAM N/A 12/10/2012   Procedure: ABDOMINAL EZELLA;  Surgeon: Gaile LELON New, MD;  Location[facility] CATH LAB;  Service: Cardiovascular;  Laterality: N/A;   CARDIAC CATHETERIZATION   12/05/2010   with stent placement   FEMORAL-POPLITEAL BYPASS GRAFT Right 02/07/2013   Procedure: BYPASS GRAFT FEMORAL-POPLITEAL ARTERY- RIGHT;  Surgeon: Lynwood JONETTA Collum, MD;  Location: [facility] OR;  Service: Vascular;  Laterality: Right;  Right femoral to below knee popliteal bypass using right non reversed great saphenous vein   INGUINAL HERNIA REPAIR  2010   Right   INGUINAL HERNIA REPAIR  09/11/2018   INTRAOPERATIVE ARTERIOGRAM Right 02/07/2013   Procedure: Attempted INTRA OPERATIVE ARTERIOGRAM;  Surgeon: Lynwood JONETTA Collum, MD;  Location: [facility] OR;  Service: Vascular;  Laterality: Right;   IR RETROGRADE PYELOGRAM  12/10/2019   TRANSFORAMINAL LUMBAR INTERBODY FUSION (TLIF) WITH PEDICLE SCREW FIXATION 1 LEVEL Right 04/12/2023   Procedure: RIGHT-SIDED LUMBAR 4- LUMBAR 5 TRANSFORAMINAL LUMBAR INTERBODY FUSION AND DECOMPRESSION WITH INSTRUMENTATION AND ALLOGRAFT;  Surgeon: Beuford Anes, MD;  Location: [facility] OR;  Service: Orthopedics;  Laterality: Right;   TRANSURETHRAL RESECTION OF PROSTATE  12/10/2019    No Known Allergies  Prior to Admission medications   Medication Sig Start Date End Date Taking? Authorizing Provider  amLODipine  (NORVASC ) 10 MG tablet Take 1 tablet (10 mg total) by mouth daily. 11/28/23     aspirin  EC 81 MG tablet Take 81 mg by mouth daily. Swallow whole.  [provider]  Azelastine  HCl 137 MCG/SPRAY SOLN Place 2 sprays into both nostrils 2 (two) times daily. 11/05/23     clindamycin  (CLEOCIN  T) 1 % lotion Apply to affected area of face twice daily. 11/06/23     Continuous Glucose Sensor (FREESTYLE LIBRE 3 PLUS SENSOR) MISC Change sensor every 15 days. 06/18/23   Shamleffer, Ibtehal Jaralla, MD  cyclobenzaprine  (FLEXERIL ) 5 MG tablet TAKE 1-2 TABLETS BY MOUTH AT BEDTIME AS NEEDED 01/11/24     doxycycline  (VIBRAMYCIN ) 100 MG capsule Take 1 capsule (100 mg total) by mouth 2 (two) times daily with food and water to avoid stomach upset. 01/09/24     DULoxetine  (CYMBALTA ) 30 MG capsule  Take 1 capsule (30 mg total) by mouth daily. 06/18/23   Amon Aloysius BRAVO, MD  empagliflozin  (JARDIANCE ) 25 MG TABS tablet Take 1 tablet (25 mg total) by mouth daily. 11/28/23     ezetimibe  (ZETIA ) 10 MG tablet Take 1 tablet (10 mg total) by mouth daily. 12/27/22   Paz, Jose E, MD  Fluocinolone  Acetonide Scalp (DERMA-SMOOTHE /FS SCALP) 0.01 % OIL Apply 1 application topically to the scalp daily. Patient taking differently: Apply 1 application  topically daily as needed (scalp irritation). 02/26/23     fluticasone  (FLONASE ) 50 MCG/ACT nasal spray Place 2 sprays into both nostrils daily. 06/18/23   Paz, Jose E, MD  fluticasone -salmeterol (ADVAIR ) 100-50 MCG/ACT AEPB Inhale 1 puff into the lungs 2 (two) times daily. 02/15/23   Amon Aloysius BRAVO, MD  glipiZIDE  (GLUCOTROL  XL) 5 MG 24 hr tablet Take 1 tablet (5 mg total) by mouth daily. 12/04/23     HYDROcodone -acetaminophen  (NORCO/VICODIN) 5-325 MG tablet Take 1 tablet by mouth every six to eight hours as needed for pain. 01/11/24     ipratropium (ATROVENT ) 0.06 % nasal spray Place 1 spray into both nostrils 2 (two) times daily. Can also use medication as needed before activities that cause the nose to run, like eating. 11/05/23     latanoprost  (XALATAN ) 0.005 % ophthalmic solution Place 1 drop into both eyes at bedtime. 12/28/22     latanoprost  (XALATAN ) 0.005 % ophthalmic solution Place 1 drop into both eyes at bedtime. 08/10/23     losartan  (COZAAR ) 100 MG tablet Take 1 tablet (100 mg total) by mouth daily. 08/27/23     losartan  (COZAAR ) 100 MG tablet Take 1 tablet (100 mg total) by mouth daily. 11/28/23     methocarbamol  (ROBAXIN ) 500 MG tablet Take 1-2 tablets (500-1,000 mg total) by mouth every 6 (six) to 8 (eight) hours as needed for spasms/muscle tension 04/27/23     metoprolol  succinate (TOPROL -XL) 100 MG 24 hr tablet Take 1.5 tablets (150 mg total) by mouth daily. 11/28/23     metoprolol  succinate (TOPROL -XL) 100 MG 24 hr tablet Take 1.5 tablets (150 mg total) by mouth daily.  12/10/23     nitroGLYCERIN  (NITROSTAT ) 0.4 MG SL tablet Place 1 tablet (0.4 mg total) under the tongue every 5 (five) minutes as needed for chest pain. 10/26/23   Lavona Agent, MD  Oxymetazoline  HCl (NASAL RELIEF NA) Place 1 spray into the nose in the morning.    [provider]  repaglinide  (PRANDIN ) 0.5 MG tablet Take 1 tablet (0.5 mg total) by mouth daily before breakfast. 12/01/22   Shamleffer, Ibtehal Jaralla, MD  rosuvastatin  (CRESTOR ) 40 MG tablet Take 1 tablet (40 mg total) by mouth daily. 12/21/23   Lavona Agent, MD  Semaglutide  (RYBELSUS ) 14 MG TABS Take 1 tablet (14 mg total) by  mouth daily before breakfast. 11/28/23       Social History   Socioeconomic History   Marital status: Married    Spouse name: Not on file   Number of children: 5   Years of education: Not on file   Highest education level: Not on file  Occupational History   Occupation: retired, works part time, Hospital doctor   Occupation: Research scientist (life sciences) industry [place]   Tobacco Use   Smoking status: Former    Current packs/day: 0.00    Average packs/day: 0.3 packs/day for 50.0 years (15.0 ttl pk-yrs)    Types: Cigarettes    Start date: 02/03/1961    Quit date: 02/04/2011    Years since quitting: 12.9    Passive exposure: Never   Smokeless tobacco: Never  Vaping Use   Vaping status: Never Used  Substance and Sexual Activity   Alcohol  use: Not Currently   Drug use: No   Sexual activity: Not on file  Other Topics Concern   Not on file  Social History Narrative   5 children , lost a son    Lives with wife.     Social Drivers of Corporate investment banker Strain: Low Risk  (06/20/2021)   Overall Financial Resource Strain (CARDIA)    Difficulty of Paying Living Expenses: Not hard at all  Food Insecurity: Low Risk  (11/28/2023)   Received from [facility]   Hunger Vital Sign    Within the past 12 months, you worried that your food would run out before you got money to buy more: Never true    Within the past 12  months, the food you bought just didn't last and you didn't have money to get more. : Never true  Transportation Needs: No Transportation Needs (11/28/2023)   Received from [facility]    In the past 12 months, has lack of reliable transportation kept you from medical appointments, meetings, work or from getting things needed for daily living? : No  Physical Activity: Inactive (06/20/2021)   Exercise Vital Sign    Days of Exercise per Week: 0 days    Minutes of Exercise per Session: 0 min  Stress: No Stress Concern Present (06/20/2021)   [employer] of Occupational Health - Occupational Stress Questionnaire    Feeling of Stress : Not at all  Social Connections: Unknown (11/29/2021)   Received from [facility]   Social Network    Social Network: Not on file  Intimate Partner Violence: Not At Risk (06/27/2022)   Humiliation, Afraid, Rape, and Kick questionnaire    Fear of Current or Ex-Partner: No    Emotionally Abused: No    Physically Abused: No    Sexually Abused: No     Family History  Problem Relation Age of Onset   Diabetes Mother    Hypertension Mother    Heart disease Mother    Hyperlipidemia Mother    Diabetes Father    Heart disease Father    Hyperlipidemia Father    Hypertension Father    Diabetes Sister    Heart disease Sister    Hyperlipidemia Sister    Hypertension Sister    Diabetes Brother    Heart disease Brother    Hyperlipidemia Brother    Hypertension Brother    Diabetes Son    Heart disease Son    Hyperlipidemia Son    Hypertension Son    Pancreatic cancer Son    Alcohol  abuse Neg Hx    Cancer Neg  Hx    Depression Neg Hx    Early death Neg Hx    Kidney disease Neg Hx    Stroke Neg Hx    Colon cancer Neg Hx    Esophageal cancer Neg Hx    Rectal cancer Neg Hx    Stomach cancer Neg Hx    Liver cancer Neg Hx     ROS: Otherwise negative unless mentioned in HPI  Physical Examination  Vitals:   01/22/24 1202 01/22/24  1420  BP: 120/82 139/74  Pulse: (!) 106 62  Resp: 16 (!) 24  Temp: 97.9 F (36.6 C)   SpO2: 96% 100%   Body mass index is 22.63 kg/m.  General:  no acute distress Gait: Not observed HENT: WNL, normocephalic Pulmonary: normal non-labored breathing Cardiac: regular Abdomen:  soft, NT/ND, no masses Vascular Exam/Pulses: Nonpalpable left femoral, popliteal, or pedal pulses. Extremities: Extensive mottling of the left lower extremity from the toes to the mid thigh.  Left lower extremity is very cold to touch.  Edematous left foot and calf.  No sensation of the left foot or left lower leg.  Unable to wiggle toes on the left, dorsiflex, plantarflex, or flex at the knee.  Minimally able to engage quad muscles on the left. Musculoskeletal: no muscle wasting or atrophy  Neurologic: A&O X 3 Psychiatric:  The pt has Normal affect. Lymph:  Unremarkable  CBC    Component Value Date/Time   WBC 15.0 (H) 01/22/2024 1331   RBC 6.03 (H) 01/22/2024 1331   HGB 16.1 01/22/2024 1331   HCT 46.3 01/22/2024 1331   PLT 200 01/22/2024 1331   MCV 76.8 (L) 01/22/2024 1331   MCH 26.7 01/22/2024 1331   MCHC 34.8 01/22/2024 1331   RDW 15.3 01/22/2024 1331   LYMPHSABS 1.0 01/22/2024 1331   MONOABS 0.8 01/22/2024 1331   EOSABS 0.0 01/22/2024 1331   BASOSABS 0.0 01/22/2024 1331    BMET    Component Value Date/Time   NA 134 (L) 01/22/2024 1331   NA 139 11/25/2021 0000   K 4.6 01/22/2024 1331   CL 100 01/22/2024 1331   CO2 19 (L) 01/22/2024 1331   GLUCOSE 324 (H) 01/22/2024 1331   BUN 69 (H) 01/22/2024 1331   BUN 29 (A) 11/25/2021 0000   CREATININE 3.00 (H) 01/22/2024 1331   CALCIUM  9.9 01/22/2024 1331   GFRNONAA 21 (L) 01/22/2024 1331   GFRAA 60 06/07/2018 0915    COAGS: Lab Results  Component Value Date   INR 1.07 02/03/2013     Non-Invasive Vascular Imaging:   Arterial duplex performed at bedside demonstrates significant thrombus within the left femoral artery and minimal trickle of  blood flow   ASSESSMENT/PLAN: This is a 78 y.o. male who presents to the ED with concerns for left lower extremity acute limb ischemia   - The patient presents to the ED today with a 2-week history of worsening left lower extremity pain, weakness, and numbness - He has a known history of PAD and prior history of right femoropopliteal bypass in 2014.  He was last seen in our office in April with stable claudication symptoms on the left.  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 - He states 2 weeks ago he started having rest pain in the left lower extremity, which has gotten worse and progressed up the leg.  His left leg has also gotten very cold to touch.  For the past 2 weeks he has continued to walk on the  left leg for the most part.  He reports loss of sensation of his left foot and lower leg about a week ago.  He says he stopped being able to move his left foot about 3 days ago - On exam he has nonpalpable femoral, popliteal, or pedal pulses.  His left leg is very cool to touch.  His left foot and calf are edematous.  He has extensive mottling of the left lower extremity from the foot to the mid thigh.  He has no motor or sensation of the left foot or ankle.  He is unable to bend his left leg at the knee. - Based on history and exam, the patient has acute limb ischemia of the left lower extremity.  The patient was also evaluated by Dr. Pearline.  We have explained to the patient that he will require immediate revascularization of the left lower extremity.  This will likely include left femoral artery thrombectomy and left lower extremity 4 compartment fasciotomies.  We have also made the patient aware that he is at very high risk for limb loss given that his ischemia has extended to the left thigh. - He has been made n.p.o..  He is agreeable to surgery and consent order has been placed.  Pharmacy has been consulted for initiation of heparin  drip.  We will bring the patient to the OR as soon as  possible for left lower extremity revascularization   Ahmed Holster PA-C [facility] [phone]    I agree with the above.  I have seen and evaluated the patient.  He has been followed in our office for a history of right femoral-popliteal bypass graft by Dr. Gerlean in 2014 with vein.  He has a known left superficial femoral artery occlusion which has been managed nonoperatively.  He reports a 2 to 3-week history of left leg issues.  He stopped being able to move his leg and foot several days ago.  He presented to the emergency department with family.  I had a very frank discussion with them stating that he is at extremely high risk for limb loss.  On examination his left leg is ice cold up to the mid thigh.  The skin is mottled.  He does not have any motor or sensory function up to the knee.  He does not have palpable femoral pulses on the left and no popliteal or pedal pulses that are palpable.  I discussed going the operating room for exploration and potentially attempt at revascularization.  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  We also discussed fasciotomies.  If he is a candidate for revascularization we talked about reperfusion injury and leading to renal insult and possibly death.  All these were discussed with the patient and his wife who agreed to proceed.  Malvina New

## 2024-01-22 NOTE — ED Triage Notes (Signed)
 Reports shooting pain x 2 weeks from foot to lower leg to thigh to left hip , no injury . Hx arthritis .  Was seen ata PCP , treated yet pain got worse .  Adds swelling fro foot to knee x 1 week . Had MRI on Saturday .

## 2024-01-22 NOTE — Transfer of Care (Signed)
 Immediate Anesthesia Transfer of Care Note  Patient: Steven [name]  Procedure(s) Performed: LEFT LEG THROMBECTOMY, ARTERY, FEMORAL (Left: Leg Upper) LEFT  LOWER EXTREMITY FASCIOTOMIES - FOUR COMPARTMENT (Left: Leg Lower) ANGIOPLASTY, USING XENOSURE 14CM BIOLOGIC PATCH GRAFT (Left: Groin) INTRA OPERATIVE ANGIOGRAM (Left: Groin) INSERTION, STENT LEFT EXTERNAL AND COMMON ILIAC ARTERIES USING VBX X AND INOVA X (Left: Groin) ENDARTERECTOMY, FEMORAL LEFT (Left: Groin) CYSTOSCOPY WITH BALLOON DILATION DEVICE AND COMPLEX FOLEY INSERTION (Urethra)  Patient Location: PACU  Anesthesia Type:General  Level of Consciousness: awake and alert   Airway & Oxygen Therapy: Patient Spontanous Breathing  Post-op Assessment: Report given to RN and Post -op Vital signs reviewed and stable  Post vital signs: Reviewed and stable  Last Vitals:  Vitals Value Taken Time  BP 150/63 01/22/24 19:48  Temp 98   Pulse 86 01/22/24 19:59  Resp 14 01/22/24 19:59  SpO2 100 % 01/22/24 19:59  Vitals shown include unfiled device data.  Last Pain:  Vitals:   01/22/24 1513  TempSrc:   PainSc: 8          Complications: No notable events documented.

## 2024-01-22 NOTE — Consult Note (Signed)
 NAME:  Steven [name], MRN:  [id number], DOB:  02-29-1946, LOS: 0 ADMISSION DATE:  01/22/2024, CONSULTATION DATE:  01/22/24 REFERRING MD:  Serene CHIEF COMPLAINT:  Post op management   History of Present Illness:  Steven [name] is a 78 y.o. male who has a PMH as outlined below including but not limited to A-fib, CAD, HLD, HTN, PAD s/p right femoral-popliteal bypass in 2014, COPD.  He presented to [facility] ED on 01/22/2024 with concerns for an ischemic left lower extremity.  He had been having significant pain for about 2 weeks that had worsened since onset and changed from intermittent to constant.  Pain was associated with coolness to the touch of the extremity as well as loss of sensation about 1 week ago followed by inability to move the foot a few days prior to admit. He was evaluated by vascular surgery who performed a arterial duplex which demonstrated significant thrombus within the left femoral artery and minimal trickle of blood flow.  He apparently has a known left SFA occlusion. Concern for acute left leg ischemia, he was taken to the OR for left common femoral and iliac thrombectomy, endarterectomy and profundoplasty, left common iliac stenting, aortogram and pelvic angiogram, 4 compartment fasciotomy of the left lower extremity.  While in the OR there was also difficulty facing a Foley catheter therefore urology was consulted for cystoscopy with urethral balloon dilation and complex catheter placement over a wire.  Postoperatively, he was transferred to the cardiovascular ICU a PCCM was called in consultation for assistance with medical management.  Pertinent  Medical History:  has Annual physical exam; CAD (coronary artery disease); Benign essential hypertension; GERD (gastroesophageal reflux disease); Allergic rhinitis; Chronic obstructive pulmonary disease ([name]); Chronic renal insufficiency; Seborrheic dermatitis; Atherosclerotic PVD with intermittent claudication ([name]); Hyperlipidemia with  target LDL less than 70; Bronchiectasis ([name]); BPH (benign prostatic hyperplasia); PSA elevation; Pterygium eye, bilateral; Dyslipidemia; Choroidal nevus, left; Inguinal hernia of left side without obstruction or gangrene; Nuclear sclerotic cataract of both eyes; OAG (open angle glaucoma) suspect, high risk, bilateral; PCP NOTES >>>>>>>>>>>>>>; CKD stage 3b, GFR 30-44 ml/min ([name]); LVH (left ventricular hypertrophy); Type 2 diabetes mellitus with diabetic polyneuropathy, with long-term current use of insulin  ([name]); Type 2 diabetes mellitus with hyperglycemia, with long-term current use of insulin  ([name]); Type 2 diabetes mellitus with stage 3a chronic kidney disease, with long-term current use of insulin  ([name]); Hav (hallux abducto valgus), unspecified laterality; BCC (basal cell carcinoma of skin); Lumbar radiculopathy; Pulmonary nodule 1 cm or greater in diameter; Critical limb ischemia of left lower extremity ([name]); and Ischemic leg on their problem list.  Significant Hospital Events: Including procedures, antibiotic start and stop dates in addition to other pertinent events   7/1 admit, to OR for left common femoral and iliac thrombectomy, endarterectomy and profundoplasty, left common iliac stenting, aortogram and pelvic angiogram, 4 compartment fasciotomy of the left lower extremity. Also had urology consult for cystoscopy with urethral balloon dilation and complex catheter placement over a wire.  Interim History / Subjective:  Stable post op. On 1mcg/min NE. Goal SBP 140 - 160.  Objective:  Blood pressure (!) 126/56, pulse 86, temperature 98.2 F (36.8 C), resp. rate (!) 24, height 5' 5 (1.651 m), weight 61.7 kg, SpO2 94%.        Intake/Output Summary (Last 24 hours) at 01/22/2024 2128 Last data filed at 01/22/2024 1935 Gross per 24 hour  Intake 3000 ml  Output 1500 ml  Net 1500 ml   [employer]  01/22/24 1202  Weight: 61.7 kg     Physical Exam: General: Adult male, resting in bed,  in NAD. Neuro: A&O x 3, LLE weakness post op. HEENT: [place]/AT. Sclerae anicteric. EOMI. Cardiovascular: RRR, no M/R/G.  Lungs: Respirations even and unlabored.  CTA bilaterally, No W/R/R. Abdomen: BS x 4, soft, NT/ND.  Musculoskeletal: LLE with dressings intact, no bleeding. L groin small hematoma that is soft. No edema.    Assessment & Plan:   Acute LLE limb ischemia - s/p OR for left common femoral and iliac thrombectomy, endarterectomy and profundoplasty, left common iliac stenting, aortogram and pelvic angiogram, 4 compartment fasciotomy. - Post op care per VVS. - Heparin  on hold for tonight per VVS given slight hematoma in left groin. - At risk for limb amputation. - Oxycodone  for pain control. - PRN Morphine .  Permissive HTN - goal SBP 140 - 160 per VVS. - Low dose NE for above goal. - Hold PTA antihypertensives as below.  AoCKD - at risk for worsening after contrast. Lactic acidosis. - Fluids. - Repeat lactate. - Follow BMP.  Hx PAF (does not appear to be on [facility], no mention of A.fib from recent cards notes from April 2025) , CAD, HTN, HLD. - Continue PTA, ASA, Zetia , Rosuvastatin . - Hold PTA Losartan , Amlodipine , Toprol -XL.  Hx COPD. - Budesonide [place] in lieu of PTA Advair . - Atrovent  scheduled.  Hx DM. - SSI. - Hold PTA Empagliflozin , Glipizide , Semaglutide .  Difficult foley placement - 2/2 bulbar urethral stricture, s/p OR cystoscopy with urethral balloon dilation and complex catheter placement over wire. - Maintain foley.  RUL pulmonary nodule - last imaged Feb 2025 and deemed stable from prior Aug 2024 with borderline hypermetabolism Nov 2024. - F/u CT as outpatient within the next month or two.   Best practice (evaluated daily):  Diet/type: Regular consistency (see orders) DVT prophylaxis: systemic heparin  - currently on hold overnight 7/1 given slight groin hematoma post op Pressure ulcer(s): pressure ulcer assessment deferred  GI prophylaxis:  N/A Lines: Central line and Arterial Line Foley:  Yes, and it is still needed Code Status:  full code Last date of multidisciplinary goals of care discussion: None yet.  Labs   CBC: Recent Labs  Lab 01/22/24 1331  WBC 15.0*  NEUTROABS 13.1*  HGB 16.1  HCT 46.3  MCV 76.8*  PLT 200    Basic Metabolic Panel: Recent Labs  Lab 01/22/24 1331  NA 134*  K 4.6  CL 100  CO2 19*  GLUCOSE 324*  BUN 69*  CREATININE 3.00*  CALCIUM  9.9   GFR: Estimated Creatinine Clearance: 18.5 mL/min (A) (by C-G formula based on SCr of 3 mg/dL (H)). Recent Labs  Lab 01/22/24 1331  WBC 15.0*  LATICACIDVEN 2.9*    Liver Function Tests: Recent Labs  Lab 01/22/24 1331  AST 171*  ALT 67*  ALKPHOS 91  BILITOT 0.6  PROT 7.5  ALBUMIN 3.8   No results for input(s): LIPASE, AMYLASE in the last 168 hours. No results for input(s): AMMONIA in the last 168 hours.  ABG    Component Value Date/Time   TCO2 30 12/10/2012 0857     Coagulation Profile: No results for input(s): INR, PROTIME in the last 168 hours.  Cardiac Enzymes: No results for input(s): CKTOTAL, CKMB, CKMBINDEX, TROPONINI in the last 168 hours.  HbA1C: Hemoglobin A1C  Date/Time Value Ref Range Status  03/08/2023 12:28 PM 6.9 (A) 4.0 - 5.6 % Final   Hgb A1c MFr Bld  Date/Time Value Ref Range Status  11/28/2022 02:13 PM 8.0 (H) 4.6 - 6.5 % Final    Comment:    Glycemic Control Guidelines for People with Diabetes:Non Diabetic:  <6%Goal of Therapy: <7%Additional Action Suggested:  >8%   07/27/2022 09:10 AM 7.6 (H) 4.6 - 6.5 % Final    Comment:    Glycemic Control Guidelines for People with Diabetes:Non Diabetic:  <6%Goal of Therapy: <7%Additional Action Suggested:  >8%     CBG: Recent Labs  Lab 01/22/24 1548 01/22/24 2005  GLUCAP 222* 88    Review of Systems:   All negative; except for those that are bolded, which indicate positives.  Constitutional: weight loss, weight gain, night sweats,  fevers, chills, fatigue, weakness.  HEENT: headaches, sore throat, sneezing, nasal congestion, post nasal drip, difficulty swallowing, tooth/dental problems, visual complaints, visual changes, ear aches. Neuro: difficulty with speech, weakness, numbness, ataxia. CV:  chest pain, orthopnea, PND, swelling in lower extremities, dizziness, palpitations, syncope.  Resp: cough, hemoptysis, dyspnea, wheezing. GI: heartburn, indigestion, abdominal pain, nausea, vomiting, diarrhea, constipation, change in bowel habits, loss of appetite, hematemesis, melena, hematochezia.  GU: dysuria, change in color of urine, urgency or frequency, flank pain, hematuria. MSK: joint pain or swelling, decreased range of motion, LLE pain. Psych: change in mood or affect, depression, anxiety, suicidal ideations, homicidal ideations. Skin: rash, itching, bruising.   Past Medical History:  He,  has a past medical history of Allergy, Arthritis, Atrial fibrillation ([name]), BCC (basal cell carcinoma of skin), BPH (benign prostatic hyperplasia), COPD (chronic obstructive pulmonary disease) ([name]), Coronary artery disease, Diabetes 1.5, managed as type 1 ([name]), Diverticulosis, Dyslipidemia, GERD (gastroesophageal reflux disease), Glaucoma, Hammer toes, bilateral, Hernia, Hyperlipidemia, Hypertension, Peripheral vascular disease ([name]), Pterygium eye, bilateral (11/09/2016), and Sickle cell anemia ([name]).   Surgical History:   Past Surgical History:  Procedure Laterality Date   ABDOMINAL AORTAGRAM N/A 12/10/2012   Procedure: ABDOMINAL AORTAGRAM;  Surgeon: Gaile LELON New, MD;  Location: [facility] CATH LAB;  Service: Cardiovascular;  Laterality: N/A;   CARDIAC CATHETERIZATION  12/05/2010   with stent placement   FEMORAL-POPLITEAL BYPASS GRAFT Right 02/07/2013   Procedure: BYPASS GRAFT FEMORAL-POPLITEAL ARTERY- RIGHT;  Surgeon: Lynwood JONETTA Collum, MD;  Location: [facility] OR;  Service: Vascular;  Laterality: Right;  Right femoral to below knee popliteal  bypass using right non reversed great saphenous vein   INGUINAL HERNIA REPAIR  2010   Right   INGUINAL HERNIA REPAIR  09/11/2018   INTRAOPERATIVE ARTERIOGRAM Right 02/07/2013   Procedure: Attempted INTRA OPERATIVE ARTERIOGRAM;  Surgeon: Lynwood JONETTA Collum, MD;  Location: [facility] OR;  Service: Vascular;  Laterality: Right;   IR RETROGRADE PYELOGRAM  12/10/2019   TRANSFORAMINAL LUMBAR INTERBODY FUSION (TLIF) WITH PEDICLE SCREW FIXATION 1 LEVEL Right 04/12/2023   Procedure: RIGHT-SIDED LUMBAR 4- LUMBAR 5 TRANSFORAMINAL LUMBAR INTERBODY FUSION AND DECOMPRESSION WITH INSTRUMENTATION AND ALLOGRAFT;  Surgeon: Beuford Anes, MD;  Location: [facility] OR;  Service: Orthopedics;  Laterality: Right;   TRANSURETHRAL RESECTION OF PROSTATE  12/10/2019     Social History:   reports that he quit smoking about 12 years ago. His smoking use included cigarettes. He started smoking about 63 years ago. He has a 15 pack-year smoking history. He has never been exposed to tobacco smoke. He has never used smokeless tobacco. He reports that he does not currently use alcohol . He reports that he does not use drugs.   Family History:  His family history includes Diabetes in his brother, father, mother, sister, and son; Heart disease in his brother, father, mother, sister, and son;  Hyperlipidemia in his brother, father, mother, sister, and son; Hypertension in his brother, father, mother, sister, and son; Pancreatic cancer in his son. There is no history of Alcohol  abuse, Cancer, Depression, Early death, Kidney disease, Stroke, Colon cancer, Esophageal cancer, Rectal cancer, Stomach cancer, or Liver cancer.   Allergies No Known Allergies   Home Medications  Prior to Admission medications   Medication Sig Start Date End Date Taking? Authorizing Provider  amLODipine  (NORVASC ) 10 MG tablet Take 1 tablet (10 mg total) by mouth daily. 11/28/23  Yes   aspirin  EC 81 MG tablet Take 81 mg by mouth daily. Swallow whole.   Yes [provider]  Azelastine  HCl 137 MCG/SPRAY SOLN Place 2 sprays into both nostrils 2 (two) times daily. 11/05/23  Yes   clindamycin  (CLEOCIN  T) 1 % lotion Apply to affected area of face twice daily. 11/06/23  Yes   cyclobenzaprine  (FLEXERIL ) 5 MG tablet TAKE 1-2 TABLETS BY MOUTH AT BEDTIME AS NEEDED 01/11/24  Yes   doxycycline  (VIBRAMYCIN ) 100 MG capsule Take 1 capsule (100 mg total) by mouth 2 (two) times daily with food and water to avoid stomach upset. 01/09/24  Yes   DULoxetine  (CYMBALTA ) 30 MG capsule Take 1 capsule (30 mg total) by mouth daily. 06/18/23  Yes Paz, Aloysius BRAVO, MD  empagliflozin  (JARDIANCE ) 25 MG TABS tablet Take 1 tablet (25 mg total) by mouth daily. 11/28/23  Yes   ezetimibe  (ZETIA ) 10 MG tablet Take 1 tablet (10 mg total) by mouth daily. 12/27/22  Yes Paz, Jose E, MD  fluticasone  (FLONASE ) 50 MCG/ACT nasal spray Place 2 sprays into both nostrils daily. 06/18/23  Yes Paz, Aloysius BRAVO, MD  fluticasone -salmeterol (ADVAIR ) 100-50 MCG/ACT AEPB Inhale 1 puff into the lungs 2 (two) times daily. 02/15/23  Yes Paz, Aloysius BRAVO, MD  glipiZIDE  (GLUCOTROL  XL) 5 MG 24 hr tablet Take 1 tablet (5 mg total) by mouth daily. 12/04/23  Yes   HYDROcodone -acetaminophen  (NORCO/VICODIN) 5-325 MG tablet Take 1 tablet by mouth every six to eight hours as needed for pain. 01/11/24  Yes   latanoprost  (XALATAN ) 0.005 % ophthalmic solution Place 1 drop into both eyes at bedtime. 12/28/22  Yes   latanoprost  (XALATAN ) 0.005 % ophthalmic solution Place 1 drop into both eyes at bedtime. 08/10/23  Yes   losartan  (COZAAR ) 100 MG tablet Take 1 tablet (100 mg total) by mouth daily. 08/27/23  Yes   metoprolol  succinate (TOPROL -XL) 100 MG 24 hr tablet Take 1.5 tablets (150 mg total) by mouth daily. 11/28/23  Yes   rosuvastatin  (CRESTOR ) 40 MG tablet Take 1 tablet (40 mg total) by mouth daily. 12/21/23  Yes Lavona Agent, MD  Semaglutide  (RYBELSUS ) 14 MG TABS Take 1 tablet (14 mg total) by mouth daily before breakfast. 11/28/23  Yes   Continuous Glucose  Sensor (FREESTYLE LIBRE 3 PLUS SENSOR) MISC Change sensor every 15 days. 06/18/23   Shamleffer, Ibtehal Jaralla, MD  Fluocinolone  Acetonide Scalp (DERMA-SMOOTHE /FS SCALP) 0.01 % OIL Apply 1 application topically to the scalp daily. Patient taking differently: Apply 1 application  topically daily as needed (scalp irritation). 02/26/23     ipratropium (ATROVENT ) 0.06 % nasal spray Place 1 spray into both nostrils 2 (two) times daily. Can also use medication as needed before activities that cause the nose to run, like eating. 11/05/23     losartan  (COZAAR ) 100 MG tablet Take 1 tablet (100 mg total) by mouth daily. 11/28/23     methocarbamol  (ROBAXIN ) 500 MG tablet Take 1-2 tablets (500-1,000  mg total) by mouth every 6 (six) to 8 (eight) hours as needed for spasms/muscle tension 04/27/23     metoprolol  succinate (TOPROL -XL) 100 MG 24 hr tablet Take 1.5 tablets (150 mg total) by mouth daily. 12/10/23     nitroGLYCERIN  (NITROSTAT ) 0.4 MG SL tablet Place 1 tablet (0.4 mg total) under the tongue every 5 (five) minutes as needed for chest pain. 10/26/23   Lavona Agent, MD  Oxymetazoline  HCl (NASAL RELIEF NA) Place 1 spray into the nose in the morning.    [provider]  repaglinide  (PRANDIN ) 0.5 MG tablet Take 1 tablet (0.5 mg total) by mouth daily before breakfast. 12/01/22   Shamleffer, Donell Cardinal, MD     Critical care time: 35 min.   Sammi Gore, PA - [facility] For pager details, please see AMION or use Epic chat  After 1900, please call [facility] for cross coverage needs 01/22/2024, 9:28 PM

## 2024-01-22 NOTE — Op Note (Signed)
 Operative Note  Preoperative diagnosis:  1.  Difficult Foley catheter placement  Postoperative diagnosis: 1.  Bulbar urethral stricture  Procedure(s): 1.  Cystoscopy with urethral balloon dilation and complex catheter placement over wire  Surgeon: Sherwood Edison, MD  Assistants: None  Anesthesia: General  Complications: None immediate  EBL: Minimal  Specimens: 1.  None  Drains/Catheters: 1.  16 French council tip catheter  Intraoperative findings: 1.  Into urethra on cystoscopy showed a pinpoint bulbar urethral stricture.  Indication: 78 year old male undergoing surgery with vascular surgery on his left lower extremity for vascular compromise had difficult Foley catheter placement.  Assistance was requested.  Description of procedure:  Upon arrival, the patient was in supine position and under general anesthesia.  His penis was prepped and draped.  I attempted to advance a 54 French coud tip catheter and this met resistance at the mid urethra.  I advanced a 17 French flexible cystoscope into the urethra up to the stricture that was encountered and passed a sensor wire through it and into the bladder.  A balloon dilator was advanced over the wire and transversed the stricture.  I inflated the balloon to a pressure of 18.  This remained inflated for approximately 5 minutes.  I deflated the balloon and withdrew it and advanced a 16 [place] council tip catheter over the wire.  This concluded my portion of the operation.    Plan: Continue catheter for at least 1 week.  After that can be removed as clinically indicated.  Likely to be hospitalized for an extended time.

## 2024-01-22 NOTE — ED Notes (Signed)
 He is going to [facility] lab now to see Dr. Pearline (vascular surgeon). IV and heparin  bolus and infusion given/begun prior to tx. He leaves via [facility] now.

## 2024-01-22 NOTE — ED Notes (Signed)
 Vascular surgery team at bedside.

## 2024-01-22 NOTE — Op Note (Signed)
 OPERATIVE NOTE  PROCEDURE:   Left common femoral and iliac thrombectomy Left common femoral endarterectomy and profundoplasty Left common iliac stenting, 8 mm x 79 mm VBX Left external iliac stenting, 8 mm x 100 mm Eluvia, postdilated with a 7 mm balloon Aortogram and pelvic angiogram 4 compartment fasciotomy of left lower extremity  PRE-OPERATIVE DIAGNOSIS: [place] 2B acute limb ischemia  POST-OPERATIVE DIAGNOSIS: same as above   SURGEON: Norman GORMAN Serve MD  ASSISTANT(S): Malvina New, MD; Adina Sender, PA   Given the complexity of the case,  the assistant was necessary in order to expedient the procedure and safely perform the technical aspects of the operation.  The assistant provided traction and countertraction to assist with exposure of the artery proximally and distally.  They assisted with suture ligature of multiple branches.  Their assistance was critical in the performance of the patch angioplasty.These skills, especially following the Prolene suture for the anastomosis, could not have been adequately performed by a scrub tech assistant.   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  The Fogarty balloon was able to be passed proximally with return of fresh thrombus although I was able to feel significant chronic calcific disease while pulling the balloon back.  We did have return of fresh thrombus on multiple passes although did not restore pulsatile inflow.  There was adequate backbleeding from the main profunda branch after endarterectomy.  There was also a smaller posterior profunda branch that had excellent backbleeding.  There was a palpable pulse in the common femoral artery after recanalization of the left common external iliac with stenting.  All 4 compartments of the lower leg had nonviable muscle that was nonreactive to electrocautery.  SPECIMEN(S):  None  INDICATIONS:   Steven [name] is a 78 y.o. male with PAD who previously underwent a right femoral-popliteal bypass in 2014.  He recently presented to clinic for follow-up and was noted to only have claudication although significantly diminished ABIs.  He presented to the emergency department today due to a 2-week history of significant left foot and lower leg pain.  He reported that he had sensory loss about a week ago and motor loss approximately 2 to 3 days ago.  He has had discoloration as well as coldness from his foot up to his mid thigh.  I explained that given the length of time and his significant regular on exam as well as mottling up to his mid thigh that he would absolutely undergo an amputation in the future although at this point we would attempt revascularization to restore flow into his common femoral artery in order to be able to heal what would likely be above-knee amputation in the future.  My partner Dr. New agreed and spoke with his wife to also explain the situation.  They expressed understanding and elected to proceed with left common femoral exploration with thrombectomy, endarterectomy and 4 compartment fasciotomy.  DESCRIPTION: The patient was brought to the operating room and positioned supine on the operating room table.  General anesthesia was induced and the left lower extremity was prepped in the usual sterile fashion.  Preoperative antibiotics in the form of Ancef  were administered and timeout was performed.  My partner Dr. New started with 4 compartment fasciotomy and 2 longitudinal incisions were made on either side of the lower leg.  This was carried down to the fascia with electrocautery, on the lateral aspect of  the fascial ridge was identified and the fascia was incised on either side of this ridge exposing the anterior and lateral compartments which were noted to be nonviable and nonreactive to electrocautery.  On the medial side the fascia was incised and  the calf muscle was noted to be nonviable, a segment of the soleus was taken off the tibia to expose the deep posterior compartment and this was also noted to be nonreactive to electrocautery. In the left left groin I started with a transverse incision.  This was carried deeper with electrocautery to expose the femoral sheath.  The femoral sheath was incised exposing the femoral artery and vein.  The common femoral artery was dissected free circumferentially from the inguinal ligament down through the bifurcation.  The crossing veins were ligated with silk ties.  The chronically occluded SFA was dissected free circumferentially and the profunda was controlled circumferentially with a vessel loop.  The patient was then bolused with 5000 units of heparin  and we started with a transverse arteriotomy near the femoral bifurcation.  We did note that there was profunda backbleeding with chronic calcific disease and therefore elected to not pass a Fogarty.  There was no pulsatile inflow and I passed a 4 Fogarty proximally from multiple passes with return of acute thrombus although there is noted to be significant calcific disease and despite removing all the acute thrombus.  When flow was not restored.  Given the chronic calcific disease throughout the entire femoral artery we converted the transverse arteriotomy to a longitudinal arteriotomy to perform an endarterectomy.  A plane was developed with a [place]field and a endarterectomy of the common femoral artery was performed, this was extended down onto the profunda and an adequate endpoint was obtained.  The endarterectomy site was copiously irrigated and a small defect on the back wall was repaired with a 6-0 Prolene.  Given the significant external iliac disease we knew that we would have to this perform an endovascular intervention of the iliacs in order to restore pulsatile inflow and since we are able to visualize the true lumen we passed a wire into the iliac prior to  performing the patch angioplasty.  A glide advantage wire was passed up into the common iliac artery which is where we are meeting resistance and at this time we left the wire in place and turned attention to perform the patch angioplasty.  A bovine pericardial patch was fashioned to the transverse arteriotomy with a running 5-0 Prolene.  Prior to completion of the patch the sheath was passed over the wire and into the external iliac artery.  Attention was then turned to the endovascular portion of the case.  Using a KMP catheter and the glide advantage wire I was able to navigate through the true lumen and into the aorta proximally.  An aortogram was obtained which demonstrated a flow channel although severe stenosis throughout the entire common iliac and external iliac arteries.  The aortic bifurcation was marked on the screen and an 8 mm x 79 mm VBX stent was deployed with the proximal edge right at the aortic bifurcation with care not to overlap the right common iliac artery.  An 8 mm x 100 mm Eluvia stent was then used to extend this distally into the common femoral endarterectomy site and this was postdilated with a 7 mm balloon.  A completion angiogram demonstrated an adequate result with less than 30% residual stenosis and brisk flow through the stents into the endarterectomy site.  The wire  and sheath were removed and the patch angioplasty was completed.  The previously repaired injury on the back wall needed to be reinforced with a pledgeted 5-0 Prolene suture.  Wound was copiously irrigated and hemostatic agent was applied.  Once I was pleased with hemostasis the wound was closed in layers with 2-0 Vicryl, 3-0 Vicryl and 4-0 Vicryl for the skin.  Dermabond was applied to the skin.  At this point I turned the case over to the urologist who was there to place a Foley as we were unable to placement preoperatively despite multiple attempts by the nursing staff and Dr. Serene.     COMPLICATIONS: None  apparent  CONDITION: Poor will likely require above-knee amputation  Norman GORMAN Serve MD [facility] Phone Number: [phone] 01/22/2024 7:01 PM

## 2024-01-22 NOTE — Anesthesia Procedure Notes (Signed)
 Arterial Line Insertion Start/End7/07/2023 4:19 PM, 01/22/2024 4:22 PM Performed by: Paul Lamarr BRAVO, MD, Zelphia Norleen HERO, CRNA, anesthesiologist  Patient location: OR. Preanesthetic checklist: patient identified, IV checked, risks and benefits discussed, surgical consent, monitors and equipment checked, pre-op evaluation, timeout performed and anesthesia consent Patient sedated Left, radial was placed Catheter size: 20 G Hand hygiene performed  and maximum sterile barriers used   Attempts: 2 Procedure performed using ultrasound guided technique. Ultrasound Notes:anatomy identified, needle tip was noted to be adjacent to the nerve/plexus identified and no ultrasound evidence of intravascular and/or intraneural injection Following insertion, dressing applied and Biopatch. Post procedure assessment: normal and unchanged  Post procedure complications: second provider assisted and unsuccessful attempts. Patient tolerated the procedure well with no immediate complications. Additional procedure comments: Attempt by CRNA unsuccessful. Ultrasound used with one attempt by myself. Significantly stenosed/calcified radial artery noted on ultrasound. No image saved. Lawence, MD.

## 2024-01-22 NOTE — Progress Notes (Signed)
 eLink Physician-Brief Progress Note Patient Name: Steven [name] DOB: 1945/08/16 MRN: [id number]   Date of Service  01/22/2024  HPI/Events of Note  78 year old male with A-fib, coronary artery disease, metabolic syndrome, and PAD status post right femoropopliteal in 2014 presented on 7/1 with critical limb ischemia of the left lower extremity and underwent left femoral and iliac thrombectomy with endarterectomy with stenting.  In the interim, patient required urology guided cystoscopy and balloon dilation with Foley placement.  Admitted to ICU for postoperative care.  Vital signs are within normal limits on examination, saturating 97% on room air.  Patient is off of phenylephrine  and norepinephrine infusions.  Laboratory studies show hyperglycemia and elevated creatinine with some transaminitis.  Leukocytosis present.  eICU Interventions  Frequent neurovascular checks of the left lower extremity  Maintain Foley  DVT prophylaxis patient with heparin  drip GI prophylaxis not indicated   0227 -has acid reflux, Tums x 1  Intervention Category Evaluation Type: New Patient Evaluation  [name] 01/22/2024, 9:47 PM

## 2024-01-22 NOTE — ED Provider Notes (Signed)
 [facility] Provider Note   CSN: [id number] Arrival date & time: 01/22/24  1151     Patient presents with: Claudication (left)   Steven [name] is a 78 y.o. male.   Patient presents to the ER with severe left leg pain. The pain has been ongoing for the last two weeks although the swelling seemed to start within the last week. A week ago, before the swelling started, the patient saw an orthopedic Doctor who placed an MRI. The swelling and pain has progressed over the last week causing him and his wife to come to the ER. Patient has history of vascular disease and diabetes.   The history is provided by the patient and the spouse.       Prior to Admission medications   Medication Sig Start Date End Date Taking? Authorizing Provider  amLODipine  (NORVASC ) 10 MG tablet Take 1 tablet (10 mg total) by mouth daily. 11/28/23     aspirin  EC 81 MG tablet Take 81 mg by mouth daily. Swallow whole.    [provider]  Azelastine  HCl 137 MCG/SPRAY SOLN Place 2 sprays into both nostrils 2 (two) times daily. 11/05/23     clindamycin  (CLEOCIN  T) 1 % lotion Apply to affected area of face twice daily. 11/06/23     Continuous Glucose Sensor (FREESTYLE LIBRE 3 PLUS SENSOR) MISC Change sensor every 15 days. 06/18/23   Shamleffer, Ibtehal Jaralla, MD  cyclobenzaprine  (FLEXERIL ) 5 MG tablet TAKE 1-2 TABLETS BY MOUTH AT BEDTIME AS NEEDED 01/11/24     doxycycline  (VIBRAMYCIN ) 100 MG capsule Take 1 capsule (100 mg total) by mouth 2 (two) times daily with food and water to avoid stomach upset. 01/09/24     DULoxetine  (CYMBALTA ) 30 MG capsule Take 1 capsule (30 mg total) by mouth daily. 06/18/23   Amon Aloysius BRAVO, MD  empagliflozin  (JARDIANCE ) 25 MG TABS tablet Take 1 tablet (25 mg total) by mouth daily. 11/28/23     ezetimibe  (ZETIA ) 10 MG tablet Take 1 tablet (10 mg total) by mouth daily. 12/27/22   Paz, Jose E, MD  Fluocinolone  Acetonide Scalp (DERMA-SMOOTHE /FS SCALP)  0.01 % OIL Apply 1 application topically to the scalp daily. Patient taking differently: Apply 1 application  topically daily as needed (scalp irritation). 02/26/23     fluticasone  (FLONASE ) 50 MCG/ACT nasal spray Place 2 sprays into both nostrils daily. 06/18/23   Paz, Jose E, MD  fluticasone -salmeterol (ADVAIR ) 100-50 MCG/ACT AEPB Inhale 1 puff into the lungs 2 (two) times daily. 02/15/23   Amon Aloysius BRAVO, MD  glipiZIDE  (GLUCOTROL  XL) 5 MG 24 hr tablet Take 1 tablet (5 mg total) by mouth daily. 12/04/23     HYDROcodone -acetaminophen  (NORCO/VICODIN) 5-325 MG tablet Take 1 tablet by mouth every six to eight hours as needed for pain. 01/11/24     ipratropium (ATROVENT ) 0.06 % nasal spray Place 1 spray into both nostrils 2 (two) times daily. Can also use medication as needed before activities that cause the nose to run, like eating. 11/05/23     latanoprost  (XALATAN ) 0.005 % ophthalmic solution Place 1 drop into both eyes at bedtime. 12/28/22     latanoprost  (XALATAN ) 0.005 % ophthalmic solution Place 1 drop into both eyes at bedtime. 08/10/23     losartan  (COZAAR ) 100 MG tablet Take 1 tablet (100 mg total) by mouth daily. 08/27/23     losartan  (COZAAR ) 100 MG tablet Take 1 tablet (100 mg total) by mouth daily. 11/28/23  methocarbamol  (ROBAXIN ) 500 MG tablet Take 1-2 tablets (500-1,000 mg total) by mouth every 6 (six) to 8 (eight) hours as needed for spasms/muscle tension 04/27/23     metoprolol  succinate (TOPROL -XL) 100 MG 24 hr tablet Take 1.5 tablets (150 mg total) by mouth daily. 11/28/23     metoprolol  succinate (TOPROL -XL) 100 MG 24 hr tablet Take 1.5 tablets (150 mg total) by mouth daily. 12/10/23     nitroGLYCERIN  (NITROSTAT ) 0.4 MG SL tablet Place 1 tablet (0.4 mg total) under the tongue every 5 (five) minutes as needed for chest pain. 10/26/23   Lavona Agent, MD  Oxymetazoline  HCl (NASAL RELIEF NA) Place 1 spray into the nose in the morning.    [provider]  repaglinide  (PRANDIN ) 0.5 MG tablet  Take 1 tablet (0.5 mg total) by mouth daily before breakfast. 12/01/22   Shamleffer, Ibtehal Jaralla, MD  rosuvastatin  (CRESTOR ) 40 MG tablet Take 1 tablet (40 mg total) by mouth daily. 12/21/23   Lavona Agent, MD  Semaglutide  (RYBELSUS ) 14 MG TABS Take 1 tablet (14 mg total) by mouth daily before breakfast. 11/28/23       Allergies: Patient has no known allergies.    Review of Systems  Updated Vital Signs BP 120/82 (BP Location: Right Arm)   Pulse (!) 106   Temp 97.9 F (36.6 C) (Oral)   Resp 16   Ht 5' 5 (1.651 m)   Wt 61.7 kg   SpO2 96%   BMI 22.63 kg/m   Physical Exam Abdominal:     General: There is distension.   Musculoskeletal:     Comments: Left extremity: -limited range of motion in flexion and extension at the hip, knee, and ankle  -femoral, popliteal, and dorsalis pedis absent  -severe edema in the foot and lower leg -vascular reticulations and darkening of skin -cold to touch   -significant pain on palpation of posterior distal thigh -delayed capillary reflex to pedal digits    Skin:    Comments: Left leg is mottled with dusky appearance    Neurological:     Mental Status: He is alert.     (all labs ordered are listed, but only abnormal results are displayed) Labs Reviewed  CBC WITH DIFFERENTIAL/PLATELET  COMPREHENSIVE METABOLIC PANEL WITH GFR  LACTIC ACID, PLASMA  LACTIC ACID, PLASMA    EKG: None  Radiology: No results found.   Procedures   Medications Ordered in the ED  morphine  (PF) 4 MG/ML injection 4 mg (has no administration in time range)  ondansetron  (ZOFRAN ) injection 4 mg (has no administration in time range)  heparin  bolus via infusion 3,700 Units (has no administration in time range)  heparin  ADULT infusion 100 units/mL (25000 units/250mL) (has no administration in time range)                                    Medical Decision Making This patient presents to the ED for concern of leg swelling, this involves an extensive  number of treatment options, and is a complaint that carries with it a high risk of complications and morbidity.  The differential diagnosis includes acute limb ischemia due to claudication, cellulitis, lymphedema, stasis dermatitis, venous insufficiency, soft tissue contusion.   The patient has an extensive history of vascular disease including peripheral claudication, cardiac stent procedures, and diabetes. The patient was walking and driving two weeks ago but has been unable to in the last two weeks.  The skin has a dark purple hue, appears dusky and mottled. The left leg is cold, pulseless, and localized to the left leg making lymphedema, stasis dermatitis, and venous insufficiency less likely. The patient denies history of trauma making contusion less likely. Given the patients past medical history and exam findings, there is concern for acute limb ischemia.   Co morbidities / Chronic conditions that complicate the patient evaluation  Diabetes, coronary and peripheral vascular disease, hypertension.    Additional history obtained:  Additional history obtained from EMR External records from outside source obtained and reviewed including the MRI ordered by Dr. Beuford on 6/28 which resulted in degenerative spondylosis and excluded disc herniation or impingement.    Lab Tests: None. Imaging Studies ordered: None.  Cardiac Monitoring: / EKG:   Problem List / ED Course / Critical interventions / Medication management Consultations Obtained:  Acute limb ischemia.   Discussed case with DR. Pearline who recommended we expedite the patient to [facility] for evaluation. He recommended we start the patient on Heparin  infusion.    Social Determinants of Health:  The patient is with his wife.   Test / Admission - Considered:  Given physical exam findings and Dr. Laurina recommendation, the patient is being transferred to [facility] for continuation of care.     Amount and/or  Complexity of Data Reviewed Labs: ordered.  Risk Prescription drug management.       Final diagnoses:  None    ED Discharge Orders     None          Charmayne Holmes, DO 01/22/24 1403

## 2024-01-23 ENCOUNTER — Other Ambulatory Visit: Payer: Self-pay

## 2024-01-23 DIAGNOSIS — D72829 Elevated white blood cell count, unspecified: Secondary | ICD-10-CM | POA: Diagnosis not present

## 2024-01-23 DIAGNOSIS — I70222 Atherosclerosis of native arteries of extremities with rest pain, left leg: Secondary | ICD-10-CM | POA: Diagnosis not present

## 2024-01-23 DIAGNOSIS — Z9889 Other specified postprocedural states: Secondary | ICD-10-CM

## 2024-01-23 DIAGNOSIS — N179 Acute kidney failure, unspecified: Secondary | ICD-10-CM | POA: Diagnosis not present

## 2024-01-23 DIAGNOSIS — N189 Chronic kidney disease, unspecified: Secondary | ICD-10-CM | POA: Diagnosis not present

## 2024-01-23 LAB — HEPARIN LEVEL (UNFRACTIONATED): Heparin Unfractionated: 0.53 [IU]/mL (ref 0.30–0.70)

## 2024-01-23 LAB — CBC
HCT: 30.6 % — ABNORMAL LOW (ref 39.0–52.0)
HCT: 31.4 % — ABNORMAL LOW (ref 39.0–52.0)
Hemoglobin: 10.6 g/dL — ABNORMAL LOW (ref 13.0–17.0)
Hemoglobin: 10.9 g/dL — ABNORMAL LOW (ref 13.0–17.0)
MCH: 26.9 pg (ref 26.0–34.0)
MCH: 26.9 pg (ref 26.0–34.0)
MCHC: 34.6 g/dL (ref 30.0–36.0)
MCHC: 34.7 g/dL (ref 30.0–36.0)
MCV: 77.7 fL — ABNORMAL LOW (ref 80.0–100.0)
MCV: 77.5 fL — ABNORMAL LOW (ref 80.0–100.0)
Platelets: 146 10*3/uL — ABNORMAL LOW (ref 150–400)
Platelets: 162 10*3/uL (ref 150–400)
RBC: 3.94 MIL/uL — ABNORMAL LOW (ref 4.22–5.81)
RBC: 4.05 MIL/uL — ABNORMAL LOW (ref 4.22–5.81)
RDW: 14.9 % (ref 11.5–15.5)
RDW: 14.9 % (ref 11.5–15.5)
WBC: 16.5 10*3/uL — ABNORMAL HIGH (ref 4.0–10.5)
WBC: 17.1 10*3/uL — ABNORMAL HIGH (ref 4.0–10.5)
nRBC: 0 % (ref 0.0–0.2)
nRBC: 0 % (ref 0.0–0.2)

## 2024-01-23 LAB — BASIC METABOLIC PANEL WITH GFR
Anion gap: 11 (ref 5–15)
BUN: 55 mg/dL — ABNORMAL HIGH (ref 8–23)
CO2: 18 mmol/L — ABNORMAL LOW (ref 22–32)
Calcium: 8.4 mg/dL — ABNORMAL LOW (ref 8.9–10.3)
Chloride: 110 mmol/L (ref 98–111)
Creatinine, Ser: 2.59 mg/dL — ABNORMAL HIGH (ref 0.61–1.24)
GFR, Estimated: 25 mL/min — ABNORMAL LOW (ref >60)
Glucose, Bld: 171 mg/dL — ABNORMAL HIGH (ref 70–99)
Potassium: 4.3 mmol/L (ref 3.5–5.1)
Sodium: 139 mmol/L (ref 135–145)

## 2024-01-23 LAB — GLUCOSE, CAPILLARY
Glucose-Capillary: 169 mg/dL — ABNORMAL HIGH (ref 70–99)
Glucose-Capillary: 114 mg/dL — ABNORMAL HIGH (ref 70–99)
Glucose-Capillary: 173 mg/dL — ABNORMAL HIGH (ref 70–99)
Glucose-Capillary: 179 mg/dL — ABNORMAL HIGH (ref 70–99)
Glucose-Capillary: 241 mg/dL — ABNORMAL HIGH (ref 70–99)
Glucose-Capillary: 209 mg/dL — ABNORMAL HIGH (ref 70–99)

## 2024-01-23 LAB — PHOSPHORUS: Phosphorus: 4.5 mg/dL (ref 2.5–4.6)

## 2024-01-23 LAB — MAGNESIUM: Magnesium: 2.7 mg/dL — ABNORMAL HIGH (ref 1.7–2.4)

## 2024-01-23 MED ORDER — HEPARIN (PORCINE) 25000 UT/250ML-% IV SOLN
1000.0000 [IU]/h | INTRAVENOUS | Status: DC
  Administered 2024-01-23: 1000 [IU]/h via INTRAVENOUS
  Filled 2024-01-23: qty 250

## 2024-01-23 MED ORDER — CALCIUM CARBONATE ANTACID 500 MG PO CHEW
2.0000 | CHEWABLE_TABLET | Freq: Once | ORAL | Status: AC
  Administered 2024-01-23: 400 mg via ORAL
  Filled 2024-01-23: qty 2

## 2024-01-23 MED ORDER — NOREPINEPHRINE 4 MG/250ML-% IV SOLN
0.0000 ug/min | INTRAVENOUS | Status: DC
  Administered 2024-01-23: 1 ug/min via INTRAVENOUS
  Administered 2024-01-24: 9 ug/min via INTRAVENOUS
  Filled 2024-01-23: qty 250

## 2024-01-23 MED ORDER — SODIUM CHLORIDE 0.9 % IV SOLN: 250.0000 mL | INTRAVENOUS | Status: AC

## 2024-01-23 NOTE — Anesthesia Postprocedure Evaluation (Signed)
 Anesthesia Post Note  Patient: Steven [name]  Procedure(s) Performed: LEFT LEG THROMBECTOMY, ARTERY, FEMORAL (Left: Leg Upper) LEFT  LOWER EXTREMITY FASCIOTOMIES - FOUR COMPARTMENT (Left: Leg Lower) ANGIOPLASTY, USING XENOSURE 14CM BIOLOGIC PATCH GRAFT (Left: Groin) INTRA OPERATIVE ANGIOGRAM (Left: Groin) INSERTION, STENT LEFT EXTERNAL AND COMMON ILIAC ARTERIES USING VBX X AND INOVA X (Left: Groin) ENDARTERECTOMY, FEMORAL LEFT (Left: Groin) CYSTOSCOPY WITH BALLOON DILATION DEVICE AND COMPLEX FOLEY INSERTION (Urethra)     Patient location during evaluation: PACU Anesthesia Type: General Level of consciousness: awake and alert Pain management: pain level controlled Vital Signs Assessment: post-procedure vital signs reviewed and stable Respiratory status: spontaneous breathing, nonlabored ventilation, respiratory function stable and patient connected to nasal cannula oxygen Cardiovascular status: blood pressure returned to baseline and stable Postop Assessment: no apparent nausea or vomiting Anesthetic complications: no   No notable events documented.  Last Vitals:  Vitals:   01/23/24 0230 01/23/24 0300  BP: (!) 143/64 132/61  Pulse: 97 97  Resp: (!) 22 (!) 24  Temp:    SpO2: 96% 96%    Last Pain:  Vitals:   01/23/24 0304  TempSrc:   PainSc: Asleep                 Epifanio Lamar BRAVO

## 2024-01-23 NOTE — Evaluation (Signed)
 Occupational Therapy Evaluation Patient Details Name: Steven [name] MRN: [id number] DOB: 1946/04/18 Today's Date: 01/23/2024   History of Present Illness   78 year old male admitted 7/1 who presented with left lower extremity pain, concerning for critical limb ischemia, went to OR underwent left common femoral and iliac thrombectomy, endarterectomy, profundoplasty, left common iliac stenting and 4 compartment fasciotomy.  Possible work up for left AKA as well.   PMH: paroxysmal A-fib, coronary artery disease, peripheral arterial disease status post right femoropopliteal bypass in 2014, diabetes type 2     Clinical Impressions Patient is s/p LLE with common femoeral iliac thrombectomy endartere tomy profundoplasty and fasciotomy surgery resulting in functional limitations due to the deficits listed below (see OT problem list). Pt at baseline indep and mowing the yard. Pt at this time educated on lateral transfers and bed mobility with (A) required. PT has not received tray so OT calling and now pending tray by 12:30 to eat food. Wife requesting that rehab be closer to [place].  Patient will benefit from skilled OT acutely to increase independence and safety with ADLS to allow discharge Patient will benefit from intensive inpatient follow-up therapy, >3 hours/day .      If plan is discharge home, recommend the following:   Two people to help with walking and/or transfers;Two people to help with bathing/dressing/bathroom     Functional Status Assessment   Patient has had a recent decline in their functional status and demonstrates the ability to make significant improvements in function in a reasonable and predictable amount of time.     Equipment Recommendations   Wheelchair (measurements OT);Wheelchair cushion (measurements OT);Hospital bed;BSC/3in1     Recommendations for Other Services   Rehab consult ([facility] or facility in [place])     Precautions/Restrictions    Precautions Precautions: Fall Recall of Precautions/Restrictions: Intact Precaution/Restrictions Comments: aline, foley Restrictions Weight Bearing Restrictions Per Provider Order: No     Mobility Bed Mobility Overal bed mobility: Needs Assistance Bed Mobility: Sit to Supine       Sit to supine: Mod assist   General bed mobility comments: assist for LEs and torso    Transfers Overall transfer level: Needs assistance Equipment used: None Transfers: Bed to chair/wheelchair/BSC            Lateral/Scoot Transfers: Max assist General transfer comment: lateral scoot toward the R side with x3 scoots to reach the eob.      Balance Overall balance assessment: Needs assistance Sitting-balance support: No upper extremity supported, Feet supported Sitting balance-Leahy Scale: Fair                                     ADL either performed or assessed with clinical judgement   ADL Overall ADL's : Needs assistance/impaired Eating/Feeding: Set up   Grooming: Set up   Upper Body Bathing: Minimal assistance;Sitting   Lower Body Bathing: Total assistance           Toilet Transfer: Total assistance             General ADL Comments: pt currently in chair on arrival and helped back to bed with slide pivot. pt somewhat limited by aline in L wrist     Vision Baseline Vision/History: 1 Wears glasses       Perception         Praxis         Pertinent Vitals/Pain Pain Assessment Pain Assessment:  Faces Faces Pain Scale: Hurts little more Pain Location: left LE Pain Descriptors / Indicators: Aching, Discomfort, Grimacing, Guarding Pain Intervention(s): Monitored during session, Premedicated before session, Repositioned     Extremity/Trunk Assessment Upper Extremity Assessment Upper Extremity Assessment: Overall WFL for tasks assessed (Aline L wrist limiting)   Lower Extremity Assessment Lower Extremity Assessment: Defer to PT evaluation RLE  Deficits / Details: reports numbness, pain in R LE at the quad. RN notified informed. no changes in temperature to touch, decreased hip flexion compared to LLE and says he can feel touch but its different LLE Deficits / Details: grossly 2-/5 LLE: Unable to fully assess due to pain   Cervical / Trunk Assessment Cervical / Trunk Assessment: Kyphotic   Communication Communication Communication: No apparent difficulties   Cognition Arousal: Alert Behavior During Therapy: WFL for tasks assessed/performed Cognition: No apparent impairments                               Following commands: Intact       Cueing  General Comments   Cueing Techniques: Verbal cues;Tactile cues;Visual cues  VSS on RA   Exercises     Shoulder Instructions      Home Living Family/patient expects to be discharged to:: Private residence Living Arrangements: Spouse/significant other Available Help at Discharge: Family;Available PRN/intermittently (wife cant physically assist and she leaves to get groceries and go to PT 3 x week for her own health issues) Type of Home: House Home Access: Stairs to enter [employer] of Steps: 4 Entrance Stairs-Rails: None Home Layout: One level     Bathroom Shower/Tub: Tub/shower unit;Walk-in shower   Bathroom Toilet: Standard Bathroom Accessibility: Yes   Home Equipment: Agricultural consultant (2 wheels)   Additional Comments: Wife states pt had work up for power wheelchair but she isnt sure where they are at in the process      Prior Functioning/Environment Prior Level of Function : Independent/Modified Independent;Driving             Mobility Comments: was using RW just prior to coming to hospital per pt ADLs Comments: Wife assisted prn, wife cooks and cleans, pt was driving PTA more than wife and wife doesnt drive at night    OT Problem List: Decreased strength;Impaired balance (sitting and/or standing);Decreased activity  tolerance;Decreased safety awareness;Decreased knowledge of use of DME or AE;Decreased knowledge of precautions;Cardiopulmonary status limiting activity   OT Treatment/Interventions: Self-care/ADL training;Therapeutic exercise;Energy conservation;DME and/or AE instruction;Patient/family education;Balance training      OT Goals(Current goals can be found in the care plan section)   Acute Rehab OT Goals Patient Stated Goal: to figure out what is happening with LLE and if they will amputate it OT Goal Formulation: With patient/family Time For Goal Achievement: 02/06/24 Potential to Achieve Goals: Good   OT Frequency:  Min 2X/week    Co-evaluation              AM-PAC OT 6 Clicks Daily Activity     Outcome Measure Help from another person eating meals?: A Little Help from another person taking care of personal grooming?: A Little Help from another person toileting, which includes using toliet, bedpan, or urinal?: A Lot Help from another person bathing (including washing, rinsing, drying)?: A Lot Help from another person to put on and taking off regular upper body clothing?: A Lot Help from another person to put on and taking off regular lower body clothing?: Total 6 Click  Score: 13   End of Session Equipment Utilized During Treatment: Oxygen Nurse Communication: Mobility status;Precautions  Activity Tolerance: Patient tolerated treatment well Patient left: in bed;with call bell/phone within reach;with bed alarm set;with family/visitor present  OT Visit Diagnosis: Unsteadiness on feet (R26.81);Muscle weakness (generalized) (M62.81)                Time: 8897-8866 OT Time Calculation (min): 31 min Charges:  OT General Charges $OT Visit: 1 Visit OT Evaluation $OT Eval Moderate Complexity: 1 Mod OT Treatments $Self Care/Home Management : 8-22 mins   Brynn, OTR/L  Acute Rehabilitation Services Office: [phone] .   Steven [name] 01/23/2024, 12:10 PM

## 2024-01-23 NOTE — Progress Notes (Addendum)
 NAME:  Steven [name], MRN:  [id number], DOB:  23-Jan-1946, LOS: 1 ADMISSION DATE:  01/22/2024, CONSULTATION DATE:  01/22/24 REFERRING MD:  Serene CHIEF COMPLAINT:  Post op management   History of Present Illness:  Steven [name] is a 78 y.o. male who has a PMH as outlined below including but not limited to A-fib, CAD, HLD, HTN, PAD s/p right femoral-popliteal bypass in 2014, COPD.  He presented to [facility] ED on 01/22/2024 with concerns for an ischemic left lower extremity.  He had been having significant pain for about 2 weeks that had worsened since onset and changed from intermittent to constant.  Pain was associated with coolness to the touch of the extremity as well as loss of sensation about 1 week ago followed by inability to move the foot a few days prior to admit. He was evaluated by vascular surgery who performed a arterial duplex which demonstrated significant thrombus within the left femoral artery and minimal trickle of blood flow.  He apparently has a known left SFA occlusion. Concern for acute left leg ischemia, he was taken to the OR for left common femoral and iliac thrombectomy, endarterectomy and profundoplasty, left common iliac stenting, aortogram and pelvic angiogram, 4 compartment fasciotomy of the left lower extremity.  While in the OR there was also difficulty facing a Foley catheter therefore urology was consulted for cystoscopy with urethral balloon dilation and complex catheter placement over a wire.  Postoperatively, he was transferred to the cardiovascular ICU a PCCM was called in consultation for assistance with medical management.  Pertinent  Medical History:  has Annual physical exam; CAD (coronary artery disease); Benign essential hypertension; GERD (gastroesophageal reflux disease); Allergic rhinitis; Chronic obstructive pulmonary disease ([name]); Chronic renal insufficiency; Seborrheic dermatitis; Atherosclerotic PVD with intermittent claudication ([name]); Hyperlipidemia with  target LDL less than 70; Bronchiectasis ([name]); BPH (benign prostatic hyperplasia); PSA elevation; Pterygium eye, bilateral; Dyslipidemia; Choroidal nevus, left; Inguinal hernia of left side without obstruction or gangrene; Nuclear sclerotic cataract of both eyes; OAG (open angle glaucoma) suspect, high risk, bilateral; PCP NOTES >>>>>>>>>>>>>>; Acute renal failure superimposed on chronic kidney disease ([name]); CKD stage 3b, GFR 30-44 ml/min ([name]); LVH (left ventricular hypertrophy); Type 2 diabetes mellitus with diabetic polyneuropathy, with long-term current use of insulin  ([name]); Type 2 diabetes mellitus with hyperglycemia, with long-term current use of insulin  ([name]); Type 2 diabetes mellitus with stage 3a chronic kidney disease, with long-term current use of insulin  ([name]); Hav (hallux abducto valgus), unspecified laterality; BCC (basal cell carcinoma of skin); Lumbar radiculopathy; Pulmonary nodule 1 cm or greater in diameter; Critical limb ischemia of left lower extremity ([name]); Ischemic leg; and Hypotension on their problem list.  Significant Hospital Events: Including procedures, antibiotic start and stop dates in addition to other pertinent events   7/1 admit, to OR for left common femoral and iliac thrombectomy, endarterectomy and profundoplasty, left common iliac stenting, aortogram and pelvic angiogram, 4 compartment fasciotomy of the left lower extremity. Also had urology consult for cystoscopy with urethral balloon dilation and complex catheter placement over a wire.  Interim History / Subjective:   Cr 2.59 WBC 17  LA normalized to 1.4  Objective:  Blood pressure 137/64, pulse 95, temperature 98.2 F (36.8 C), resp. rate (!) 23, height 5' 5 (1.651 m), weight 61.7 kg, SpO2 100%.        Intake/Output Summary (Last 24 hours) at 01/23/2024 0929 Last data filed at 01/23/2024 0600 Gross per 24 hour  Intake 3206.66 ml  Output 3400 ml  Net -193.34 ml   Filed Weights   01/22/24 1202  Weight:  61.7 kg     Physical Exam: General: chronically ill elderly M NAD Neuro: AAOx3 HEENT: NCAT, missing teeth, anicteric sclera  Cardiovascular: rr s1s2  Lungs: even unlabored on RA  Abdomen: thin soft  Musculoskeletal: LLE wrapped. Cold toes    Assessment & Plan:   LLE Limb ischemia  -s/p L common fem and iliac thrombectomy endarterectomy and profundoplasty+ L common iliac stenting + 4 compartment fasciotomy  P -post op per VVS: fq neurovasc checks, SBP 140-160 -NE to facilitate SBP goal above  -restarted heparin  7/2 AM -PRN analgesia -PT  -VVS will eval LLE musculature 7/3, anticipate need for AKA  AKI on CKD P -follow renal indices, UOP  Leukocytosis -suspect reactive 2/2 primary problem P -defer abx (other than surgical ppx as indicated) unless starts fevering  -follow fever curve and white count   HTN CAD  HLD pAF, improved P -holding home antiHTN meds w SBP goal above  -ASA, zetia , statin  Hx COPD. RUL pulm nodule  -last imaged Feb 2025 and deemed stable from prior Aug 2024 with borderline hypermetabolism Nov 2024. P - Budesonide /Brovana  - F/u CT as outpatient within the next month or two.  Hx DM. - SSI.  Difficult foley placement - 2/2 bulbar urethral stricture, s/p OR cystoscopy with urethral balloon dilation and complex catheter placement over wire. - Maintain foley    Best practice (evaluated daily):  Diet/type: Regular consistency (see orders) DVT prophylaxis: systemic heparin  - Pressure ulcer(s): pressure ulcer assessment deferred  GI prophylaxis: N/A Lines: Central line and Arterial Line Foley:  Yes, and it is still needed Code Status:  full code Last date of multidisciplinary goals of care discussion: 7/2  Labs   CBC: Recent Labs  Lab 01/22/24 1331 01/23/24 0114 01/23/24 0419  WBC 15.0* 16.5* 17.1*  NEUTROABS 13.1*  --   --   HGB 16.1 10.6* 10.9*  HCT 46.3 30.6* 31.4*  MCV 76.8* 77.7* 77.5*  PLT 200 146* 162    Basic  Metabolic Panel: Recent Labs  Lab 01/22/24 1331 01/23/24 0419  NA 134* 139  K 4.6 4.3  CL 100 110  CO2 19* 18*  GLUCOSE 324* 171*  BUN 69* 55*  CREATININE 3.00* 2.59*  CALCIUM  9.9 8.4*  MG  --  2.7*  PHOS  --  4.5   GFR: Estimated Creatinine Clearance: 21.4 mL/min (A) (by C-G formula based on SCr of 2.59 mg/dL (H)). Recent Labs  Lab 01/22/24 1331 01/22/24 2140 01/23/24 0114 01/23/24 0419  WBC 15.0*  --  16.5* 17.1*  LATICACIDVEN 2.9* 1.4  --   --     Liver Function Tests: Recent Labs  Lab 01/22/24 1331  AST 171*  ALT 67*  ALKPHOS 91  BILITOT 0.6  PROT 7.5  ALBUMIN 3.8   No results for input(s): LIPASE, AMYLASE in the last 168 hours. No results for input(s): AMMONIA in the last 168 hours.  ABG    Component Value Date/Time   TCO2 30 12/10/2012 0857     Coagulation Profile: No results for input(s): INR, PROTIME in the last 168 hours.  Cardiac Enzymes: No results for input(s): CKTOTAL, CKMB, CKMBINDEX, TROPONINI in the last 168 hours.  HbA1C: Hemoglobin A1C  Date/Time Value Ref Range Status  03/08/2023 12:28 PM 6.9 (A) 4.0 - 5.6 % Final   Hgb A1c MFr Bld  Date/Time Value Ref Range Status  01/22/2024 09:40 PM 7.9 (H) 4.8 -  5.6 % Final    Comment:    (NOTE) Diagnosis of Diabetes The following HbA1c ranges recommended by the American Diabetes Association (ADA) may be used as an aid in the diagnosis of diabetes mellitus.  Hemoglobin             Suggested A1C NGSP%              Diagnosis  <5.7                   Non Diabetic  5.7-6.4                Pre-Diabetic  >6.4                   Diabetic  <7.0                   Glycemic control for                       adults with diabetes.    11/28/2022 02:13 PM 8.0 (H) 4.6 - 6.5 % Final    Comment:    Glycemic Control Guidelines for People with Diabetes:Non Diabetic:  <6%Goal of Therapy: <7%Additional Action Suggested:  >8%     CBG: Recent Labs  Lab 01/22/24 2005 01/22/24 2316  01/22/24 2348 01/23/24 0416 01/23/24 0749  GLUCAP 88 66* 138* 169* 114*    CRITICAL CARE Performed by: Ronnald FORBES Gave   Total critical care time: 37 minutes  Critical care time was exclusive of separately billable procedures and treating other patients. Critical care was necessary to treat or prevent imminent or life-threatening deterioration.  Critical care was time spent personally by me on the following activities: development of treatment plan with patient and/or surrogate as well as nursing, discussions with consultants, evaluation of patient's response to treatment, examination of patient, obtaining history from patient or surrogate, ordering and performing treatments and interventions, ordering and review of laboratory studies, ordering and review of radiographic studies, pulse oximetry and re-evaluation of patient's condition.  Ronnald Gave MSN, AGACNP-BC [facility] Amion for pager 01/23/2024, 9:29 AM

## 2024-01-23 NOTE — Plan of Care (Signed)

## 2024-01-23 NOTE — Progress Notes (Addendum)
 Progress Note    01/23/2024 6:55 AM 1 Day Post-Op  Subjective:  wakes easily.  Had complaints of pain in the right leg/groin overnight.    Afebrile HR 80's-90's  120's-140's systolic 97% RA  Gtts:  Norepi  Vitals:   01/23/24 0630 01/23/24 0645  BP: (!) 141/63 133/67  Pulse: 97 96  Resp: (!) 24 19  Temp:    SpO2: 96% 97%    Physical Exam: General:  no distress Cardiac:  regular Lungs:  non labored on [place] Incisions:  left groin is clean and dry; lower extremity ace wrap is clean Extremities:  left foot is cold and leg is warm at the knee and thigh; motor is not intact and unable to wiggle toes; faint sensation on the dorsum of the foot just proximal to toes.  + right DP doppler signal and brisk multiphasic flow right leg bypass  CBC    Component Value Date/Time   WBC 17.1 (H) 01/23/2024 0419   RBC 4.05 (L) 01/23/2024 0419   HGB 10.9 (L) 01/23/2024 0419   HCT 31.4 (L) 01/23/2024 0419   PLT 162 01/23/2024 0419   MCV 77.5 (L) 01/23/2024 0419   MCH 26.9 01/23/2024 0419   MCHC 34.7 01/23/2024 0419   RDW 14.9 01/23/2024 0419   LYMPHSABS 1.0 01/22/2024 1331   MONOABS 0.8 01/22/2024 1331   EOSABS 0.0 01/22/2024 1331   BASOSABS 0.0 01/22/2024 1331    BMET    Component Value Date/Time   NA 139 01/23/2024 0419   NA 139 11/25/2021 0000   K 4.3 01/23/2024 0419   CL 110 01/23/2024 0419   CO2 18 (L) 01/23/2024 0419   GLUCOSE 171 (H) 01/23/2024 0419   BUN 55 (H) 01/23/2024 0419   BUN 29 (A) 11/25/2021 0000   CREATININE 2.59 (H) 01/23/2024 0419   CALCIUM  8.4 (L) 01/23/2024 0419   GFRNONAA 25 (L) 01/23/2024 0419   GFRAA 60 06/07/2018 0915    INR    Component Value Date/Time   INR 1.07 02/03/2013 1630     Intake/Output Summary (Last 24 hours) at 01/23/2024 0655 Last data filed at 01/23/2024 0600 Gross per 24 hour  Intake 3206.66 ml  Output 3400 ml  Net -193.34 ml      Assessment/Plan:  78 y.o. male is s/p:  Left common femoral and iliac thrombectomy Left  common femoral endarterectomy and profundoplasty Left common iliac stenting, 8 mm x 79 mm VBX Left external iliac stenting, 8 mm x 100 mm Eluvia, postdilated with a 7 mm balloon Aortogram and pelvic angiogram 4 compartment fasciotomy of left lower extremity  01/22/2024 for acute limb ischemia   -pt left foot cold and motor is not intact; muscle in all 4 compartments intra-op were non viable.  Will leave ace wrap on today and will remove tomorrow to evaluate muscle at that time.  He most likely will require a left above knee amputation.  Dr. Pearline has discussed this with pt and his wife.   -AKI improving with creatinine 2.59 down from 3.0 -DVT prophylaxis:  heparin  was held last evening for oozing in the groin.  Discussed with Dr. Pearline and will restart this am without bolus.    Lucie Apt, PA-C [facility] [phone] 01/23/2024 6:55 AM   VASCULAR STAFF ADDENDUM: I have independently interviewed and examined the patient. I agree with the above.  Postop day 1 from left iliofemoral thrombectomy, endarterectomy and iliac stenting with 4 compartment fasciotomies due to [place] 2B acute limb ischemia.SABRA  No Doppler signals in the foot although the leg is warm from the knee up. Given his late presentation and appearance of the limb preoperatively we know amputation was inevitable although revascularization was necessary in order to try to maximize amputation healing. His white count is stable at 17 this morning.  Hemoglobin stable.  Creatinine is mildly improved to 2.6 from 3.0 yesterday. The groin site is clean and dry without any evidence of hematoma.  Will plan to take down the fasciotomy dressings tomorrow to assess the lower leg musculature although I explained to him that he would likely require an above-knee amputation given the findings in the OR with his lower leg muscle nonviable and nonreactive to electrocautery. Given his significant medical comorbidities I  will discuss with CCM and ask if they will take over his primary.  Norman GORMAN Serve MD [facility] Phone Number: [phone] 01/23/2024 8:36 AM

## 2024-01-23 NOTE — Progress Notes (Signed)
 Arterial line dressing and antimicrobial disc changed at this time without complication. Aline resecured, line draws back and flushes. Aline re-zeroed at this time. Pt tolerated well.

## 2024-01-23 NOTE — Progress Notes (Addendum)
 PHARMACY - ANTICOAGULATION CONSULT NOTE  Pharmacy Consult for heparin   Indication: limb ischemia  No Known Allergies  Patient Measurements: Height: 5' 5 (165.1 cm) Weight: 61.7 kg (136 lb) IBW/kg (Calculated) : 61.5 HEPARIN  DW (KG): 61.7  Vital Signs: Temp: 98.2 F (36.8 C) (07/01 2045) BP: 137/64 (07/02 0700) Pulse Rate: 95 (07/02 0700)  Labs: Recent Labs    01/22/24 1331 01/23/24 0114 01/23/24 0419  HGB 16.1 10.6* 10.9*  HCT 46.3 30.6* 31.4*  PLT 200 146* 162  CREATININE 3.00*  --  2.59*    Estimated Creatinine Clearance: 21.4 mL/min (A) (by C-G formula based on SCr of 2.59 mg/dL (H)).   Medical History: Past Medical History:  Diagnosis Date   Allergy    Arthritis    Right Hip   Atrial fibrillation ([name])    BCC (basal cell carcinoma of skin)    BPH (benign prostatic hyperplasia)    COPD (chronic obstructive pulmonary disease) ([name])    Coronary artery disease    Lexiscan  Myoview 7/14:  Inf scar with minimal reversibility, inf HK, EF 45%;  Low risk.  Promus LAD stent placed in [place].  100% RCA   Diabetes 1.5, managed as type 1 ([name])    Diverticulosis    on CT   Dyslipidemia    GERD (gastroesophageal reflux disease)    has used Zantac in the past    Glaucoma    Hammer toes, bilateral    Hernia    Hyperlipidemia    Hypertension    Peripheral vascular disease ([name])    a. s/p R fem-pop BPG 01/2013 (Dr. 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 Concern for an ischemic limb so starting IV heparin . Baseline Hgb and plts are WNL. He is not on anticoagulation PTA.   Was started on heparin  infusion yesterday and underwent L common femoral/iliac thrombectomy plus femoral endarterectomy/profundoplasty with stent placement and 4 compartment  fasciotomy of LLE. Heparin  was held after procedure given hematoma L groin - okay per vascular to restart this morning without bolus. Hgb 16 > now down to 10.9, plt 162. No s/sx of bleeding.  Goal of Therapy:  Heparin  level 0.3-0.7 units/ml Monitor platelets by anticoagulation protocol: Yes   Plan:  Restart heparin  infusion at 1000 units/hr Check an 8 hr heparin  level Daily heparin  level and CBC F/u vascular plans   Thank you for allowing pharmacy to participate in this patient's care,  Suzen Sour, PharmD, BCCCP Clinical Pharmacist  Phone: [phone] 01/23/2024 8:24 AM  Please check AMION for all [facility] phone numbers After 10:00 PM, call Main Pharmacy [phone]

## 2024-01-23 NOTE — Evaluation (Addendum)
 Physical Therapy Evaluation Patient Details Name: Steven [name] MRN: [id number] DOB: [birth year] Today's Date: 01/23/2024  History of Present Illness  78 year old male admitted 7/1 who presented with left lower extremity pain, concerning for critical limb ischemia, went to OR underwent left common femoral and iliac thrombectomy, endarterectomy, profundoplasty, left common iliac stenting and 4 compartment fasciotomy.  Possible work up for left AKA as well.   PMH: paroxysmal A-fib, coronary artery disease, peripheral arterial disease status post right femoropopliteal bypass in 2014, diabetes type 2  Clinical Impression  Pt admitted with above diagnosis. Pt needing mod assist of 2 for transfers limited by pain and only able to laterally scoot to recliner. Will wait for recs for d/c when MD gives definitive surgery date/plan.  Wife cannot assist pt physically and he will need a lot of care intiially therefore recommend post acute rehab > 3 hours day. Wife requesting rehab closer to [facility] or [employer].  Pt is motivated.  Will follow acutely and progress pt as able.  Pt currently with functional limitations due to the deficits listed below (see PT Problem List). Pt will benefit from acute skilled PT to increase their independence and safety with mobility to allow discharge.           If plan is discharge home, recommend the following: A little help with walking and/or transfers;A little help with bathing/dressing/bathroom;Assistance with cooking/housework;Assist for transportation;Help with stairs or ramp for entrance   Can travel by private vehicle        Equipment Recommendations Other (comment) (TBA next venue)  Recommendations for Other Services  Rehab consult    Functional Status Assessment Patient has had a recent decline in their functional status and demonstrates the ability to make significant improvements in function in a reasonable and predictable amount of time.      Precautions / Restrictions Precautions Precautions: Fall Restrictions Weight Bearing Restrictions Per Provider Order: No      Mobility  Bed Mobility Overal bed mobility: Needs Assistance Bed Mobility: Supine to Sit     Supine to sit: Mod assist, +2 for safety/equipment     General bed mobility comments: assist for LEs and torso    Transfers Overall transfer level: Needs assistance Equipment used: None Transfers: Bed to chair/wheelchair/BSC            Lateral/Scoot Transfers: Mod assist, +2 physical assistance, From elevated surface General transfer comment: Pt scooted to drop arm recliner with mod assist of 2. Was able to weight bear on right LE for transfer and needs cues and assist for lateral scoot.    Ambulation/Gait                  Stairs            Wheelchair Mobility     Tilt Bed    Modified Rankin (Stroke Patients Only)       Balance Overall balance assessment: Needs assistance Sitting-balance support: No upper extremity supported, Feet supported Sitting balance-Leahy Scale: Fair                                       Pertinent Vitals/Pain Pain Assessment Pain Assessment: Faces Faces Pain Scale: Hurts little more Pain Location: left LE Pain Descriptors / Indicators: Aching, Discomfort, Grimacing, Guarding Pain Intervention(s): Limited activity within patient's tolerance, Monitored during session, Repositioned    Home Living Family/patient expects to be discharged  to:: Private residence Living Arrangements: Spouse/significant other Available Help at Discharge: Family;Available PRN/intermittently (wife cant physically assist and she leaves to get groceries and go to PT 3 x week for her own health issues) Type of Home: House Home Access: Stairs to enter Entrance Stairs-Rails: None Entrance Stairs-Number of Steps: 4   Home Layout: One level Home Equipment: Agricultural consultant (2 wheels) Additional Comments: Wife  states pt had work up for power wheelchair but she isnt sure where they are at in the process    Prior Function Prior Level of Function : Independent/Modified Independent;Driving             Mobility Comments: was using RW just prior to coming to hospital per pt ADLs Comments: Wife assisted prn, wife cooks and cleans, pt was driving PTA more than wife and wife doesnt drive at night     Extremity/Trunk Assessment   Upper Extremity Assessment Upper Extremity Assessment: Defer to OT evaluation    Lower Extremity Assessment Lower Extremity Assessment: RLE deficits/detail;LLE deficits/detail RLE Deficits / Details: grossly 3-5 LLE Deficits / Details: grossly 2-/5 LLE: Unable to fully assess due to pain    Cervical / Trunk Assessment Cervical / Trunk Assessment: Kyphotic  Communication   Communication Communication: No apparent difficulties    Cognition Arousal: Alert Behavior During Therapy: WFL for tasks assessed/performed   PT - Cognitive impairments: No apparent impairments                         Following commands: Intact       Cueing       General Comments General comments (skin integrity, edema, etc.): 100-110 bpm, 138/60.  91% RA    Exercises General Exercises - Lower Extremity Ankle Circles/Pumps: AAROM, Both, 5 reps, Supine Quad Sets: AROM, Both, 5 reps, Supine Long Arc Quad: AROM, Both, 5 reps, Seated Heel Slides: AAROM, Both, 5 reps, Supine Hip Flexion/Marching: AROM, Both, 5 reps, Seated   Assessment/Plan    PT Assessment Patient needs continued PT services  PT Problem List Decreased activity tolerance;Decreased strength;Decreased range of motion;Decreased balance;Decreased mobility;Decreased knowledge of use of DME;Decreased safety awareness;Decreased knowledge of precautions;Pain       PT Treatment Interventions DME instruction;Gait training;Functional mobility training;Therapeutic activities;Therapeutic exercise;Balance  training;Patient/family education;Wheelchair mobility training;Stair training    PT Goals (Current goals can be found in the Care Plan section)  Acute Rehab PT Goals Patient Stated Goal: to go home PT Goal Formulation: With patient Time For Goal Achievement: 02/06/24 Potential to Achieve Goals: Good    Frequency Min 3X/week     Co-evaluation               AM-PAC PT 6 Clicks Mobility  Outcome Measure Help needed turning from your back to your side while in a flat bed without using bedrails?: A Little Help needed moving from lying on your back to sitting on the side of a flat bed without using bedrails?: A Lot Help needed moving to and from a bed to a chair (including a wheelchair)?: A Lot Help needed standing up from a chair using your arms (e.g., wheelchair or bedside chair)?: Total Help needed to walk in hospital room?: Total Help needed climbing 3-5 steps with a railing? : Total 6 Click Score: 10    End of Session Equipment Utilized During Treatment: Gait belt Activity Tolerance: Patient limited by fatigue;Patient limited by pain Patient left: in chair;with call bell/phone within reach;with family/visitor present Nurse Communication: Mobility status  PT Visit Diagnosis: Unsteadiness on feet (R26.81);Muscle weakness (generalized) (M62.81);Pain Pain - Right/Left: Left Pain - part of body: Leg    Time: 9095-9064 PT Time Calculation (min) (ACUTE ONLY): 31 min   Charges:   PT Evaluation $PT Eval Moderate Complexity: 1 Mod PT Treatments $Therapeutic Activity: 8-22 mins PT General Charges $$ ACUTE PT VISIT: 1 Visit         Juliauna Stueve M,PT Acute Rehab Services [phone]   Stephane JULIANNA Bevel 01/23/2024, 11:34 AM

## 2024-01-23 NOTE — TOC Initial Note (Signed)
 Transition of Care [facility]) - Initial/Assessment Note    Patient Details  Name: Steven [name] MRN: [id number] Date of Birth: 12-29-1945  [facility]) CM/SW Contact:    Sudie Erminio Deems, RN Phone Number: 01/23/2024, 4:06 PM  Clinical Narrative:   Patient presented for concern for critical limb ischemia-post left common femoral and iliac thrombectomy, endarterectomy, profundoplasty, left common femoral and iliac stenting and 4 compartment fasciotomy of lower extremity. PTA patient was from home with spouse. Case Manager received updates from PT/OT that the patient and spouse wants Inpatient Rehab in the [facility]. Case Manager spoke with spouse and she has chosen [facility] and Rehab. Clinicals submitted via the HUB to see if insurance is in network. Awaiting to see if in network. Case Manager will continue to follow for additional transition of care needs.               Expected Discharge Plan: IP Rehab Facility Barriers to Discharge: Continued Medical Work up   Patient Goals and CMS Choice Patient states their goals for this hospitalization and ongoing recovery are:: patient and spouse wants CIR before transition to home.          Expected Discharge Plan and Services In-house Referral: NA Discharge Planning Services: CM Consult Post Acute Care Choice: IP Rehab Living arrangements for the past 2 months: Single Family Home                   DME Agency: NA                  Prior Living Arrangements/Services Living arrangements for the past 2 months: Single Family Home Lives with:: Spouse Patient language and need for interpreter reviewed:: Yes Do you feel safe going back to the place where you live?: Yes      Need for Family Participation in Patient Care: Yes (Comment) Care giver support system in place?: Yes (comment) Current home services: DME (rolling walker) Criminal Activity/Legal Involvement Pertinent to Current  Situation/Hospitalization: No - Comment as needed  Activities of Daily Living      Permission Sought/Granted Permission sought to share information with : Family Supports, Case Production designer, theatre/television/film, Oceanographer granted to share information with : Yes, Verbal Permission Granted     Permission granted to share info w AGENCY: [facility]        Emotional Assessment Appearance:: Appears stated age Attitude/Demeanor/Rapport: Unable to Assess Affect (typically observed): Unable to Assess Orientation: : Oriented to Self, Oriented to Place Alcohol  / Substance Use: Not Applicable Psych Involvement: No (comment)  Admission diagnosis:  Acute lower limb ischemia [I99.8] Critical limb ischemia of left lower extremity ([name]) [I70.222] Ischemic leg [I99.8] Patient Active Problem List   Diagnosis Date Noted   Critical limb ischemia of left lower extremity ([name]) 01/22/2024   Ischemic leg 01/22/2024   Hypotension 01/22/2024   Pulmonary nodule 1 cm or greater in diameter 05/10/2023   Lumbar radiculopathy 04/12/2023   BCC (basal cell carcinoma of skin) 09/26/2022   Hav (hallux abducto valgus), unspecified laterality 01/06/2022   Type 2 diabetes mellitus with diabetic polyneuropathy, with long-term current use of insulin  ([name]) 09/07/2020   Type 2 diabetes mellitus with hyperglycemia, with long-term current use of insulin  ([name]) 09/07/2020   Type 2 diabetes mellitus with stage 3a chronic kidney disease, with long-term current use of insulin  ([name]) 09/07/2020   LVH (left ventricular hypertrophy) 07/02/2020   CKD stage 3b, GFR 30-44 ml/min (  [name]) 07/01/2020   Acute renal failure superimposed on chronic kidney disease ([name]) 06/09/2020   PCP NOTES >>>>>>>>>>>>>> 02/11/2020   Inguinal hernia of left side without obstruction or gangrene 09/05/2018   Choroidal nevus, left 04/17/2018   Nuclear sclerotic cataract of both eyes 04/17/2018   OAG (open angle glaucoma)  suspect, high risk, bilateral 04/17/2018   Dyslipidemia 05/28/2017   Pterygium eye, bilateral 11/09/2016   PSA elevation 08/22/2016   BPH (benign prostatic hyperplasia) 12/29/2014   Bronchiectasis ([name]) 07/30/2014   Hyperlipidemia with target LDL less than 70 08/25/2013   Atherosclerotic PVD with intermittent claudication ([name]) 06/24/2013   Chronic renal insufficiency 12/24/2012   Seborrheic dermatitis 12/24/2012   Chronic obstructive pulmonary disease ([name]) 12/01/2012   Allergic rhinitis 11/29/2012   GERD (gastroesophageal reflux disease) 09/10/2012   Annual physical exam 08/08/2012   CAD (coronary artery disease) 08/08/2012   Benign essential hypertension 08/08/2012   PCP:  Latisha Ronal Crank, PA-C Pharmacy:   [facility] [place] [facility] [address], [address] [place] [address] Phone: [phone] Fax: [phone]  [facility] - [place], [place] - [address] [place] [address] Phone: [phone] Fax: [phone]     Social Drivers of Health (SDOH) Social History: SDOH Screenings   Food Insecurity: Low Risk  (11/28/2023)   Received from [facility]  Housing: Low Risk  (11/28/2023)   Received from [facility]  Transportation Needs: No Transportation Needs (11/28/2023)   Received from [facility]  Utilities: Low Risk  (11/28/2023)   Received from [facility]  Alcohol  Screen: Low Risk  (06/27/2022)  Depression (PHQ2-9): Low Risk  (05/16/2023)  Financial Resource Strain: Low Risk  (06/20/2021)  Physical Activity: Inactive (06/20/2021)  Social Connections: Unknown (11/29/2021)   Received from [facility]  Stress: No Stress Concern Present (06/20/2021)  Tobacco Use: Medium Risk (01/22/2024)   SDOH Interventions:     Readmission Risk Interventions     No data to display

## 2024-01-23 NOTE — Progress Notes (Signed)
 Inpatient Rehab Admissions Coordinator:   Per therapy recommendations pt was screened for CIR by Reche Lowers, PT,DPT.  Note workup tomorrow for possible AKA.  Will rescreen after POC is established and any surgical plans have been carried out.   Reche Lowers, PT, DPT Admissions Coordinator [phone] 01/23/24  12:47 PM

## 2024-01-23 NOTE — Progress Notes (Signed)
 PHARMACY - ANTICOAGULATION CONSULT NOTE  Pharmacy Consult for heparin   Indication: limb ischemia  No Known Allergies  Patient Measurements: Height: 5' 5 (165.1 cm) Weight: 61.7 kg (136 lb) IBW/kg (Calculated) : 61.5 HEPARIN  DW (KG): 61.7  Vital Signs: Temp: 98.5 F (36.9 C) (07/02 1621) Temp Source: Oral (07/02 1621) BP: 120/59 (07/02 1700) Pulse Rate: 104 (07/02 1700)  Labs: Recent Labs    01/22/24 1331 01/23/24 0114 01/23/24 0419 01/23/24 1700  HGB 16.1 10.6* 10.9*  --   HCT 46.3 30.6* 31.4*  --   PLT 200 146* 162  --   HEPARINUNFRC  --   --   --  0.53  CREATININE 3.00*  --  2.59*  --     Estimated Creatinine Clearance: 21.4 mL/min (A) (by C-G formula based on SCr of 2.59 mg/dL (H)).   Medical History: Past Medical History:  Diagnosis Date   Allergy    Arthritis    Right Hip   Atrial fibrillation ([name])    BCC (basal cell carcinoma of skin)    BPH (benign prostatic hyperplasia)    COPD (chronic obstructive pulmonary disease) ([name])    Coronary artery disease    Lexiscan  Myoview 7/14:  Inf scar with minimal reversibility, inf HK, EF 45%;  Low risk.  Promus LAD stent placed in [place].  100% RCA   Diabetes 1.5, managed as type 1 ([name])    Diverticulosis    on CT   Dyslipidemia    GERD (gastroesophageal reflux disease)    has used Zantac in the past    Glaucoma    Hammer toes, bilateral    Hernia    Hyperlipidemia    Hypertension    Peripheral vascular disease ([name])    a. s/p R fem-pop BPG 01/2013 (Dr. 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 Concern for an ischemic limb so starting IV heparin . Baseline Hgb and plts are WNL. He is not on anticoagulation PTA.   Was  started on heparin  infusion yesterday and underwent L common femoral/iliac thrombectomy plus femoral endarterectomy/profundoplasty with stent placement and 4 compartment fasciotomy of LLE. Heparin  was held after procedure given hematoma L groin - okay per vascular to restart this morning without bolus. Hgb 16 > now down to 10.9, plt 162. No s/sx of bleeding.  2nd: Heparin  level therapeutic at 0.53   Goal of Therapy:  Heparin  level 0.3-0.7 units/ml Monitor platelets by anticoagulation protocol: Yes   Plan:  Continue heparin  infusion at 1000 units/hr Check a confirmatory 8 hr heparin  level Daily heparin  level and CBC F/u vascular plans   Thank you for allowing pharmacy to participate in this patient's care,  Rankin Sams, PharmD, BCPS, BCCCP Clinical Pharmacist

## 2024-01-24 ENCOUNTER — Inpatient Hospital Stay (HOSPITAL_COMMUNITY): Admitting: Registered Nurse

## 2024-01-24 ENCOUNTER — Inpatient Hospital Stay (HOSPITAL_COMMUNITY)

## 2024-01-24 ENCOUNTER — Encounter (HOSPITAL_COMMUNITY): Admission: EM | Disposition: E | Payer: Self-pay | Source: Home / Self Care | Attending: Internal Medicine

## 2024-01-24 ENCOUNTER — Other Ambulatory Visit: Payer: Self-pay

## 2024-01-24 ENCOUNTER — Encounter (HOSPITAL_COMMUNITY): Payer: Self-pay | Admitting: Vascular Surgery

## 2024-01-24 DIAGNOSIS — I998 Other disorder of circulatory system: Secondary | ICD-10-CM

## 2024-01-24 DIAGNOSIS — R579 Shock, unspecified: Secondary | ICD-10-CM

## 2024-01-24 DIAGNOSIS — N1832 Chronic kidney disease, stage 3b: Secondary | ICD-10-CM

## 2024-01-24 DIAGNOSIS — D72829 Elevated white blood cell count, unspecified: Secondary | ICD-10-CM | POA: Diagnosis not present

## 2024-01-24 DIAGNOSIS — I129 Hypertensive chronic kidney disease with stage 1 through stage 4 chronic kidney disease, or unspecified chronic kidney disease: Secondary | ICD-10-CM

## 2024-01-24 DIAGNOSIS — R6521 Severe sepsis with septic shock: Secondary | ICD-10-CM

## 2024-01-24 DIAGNOSIS — N189 Chronic kidney disease, unspecified: Secondary | ICD-10-CM | POA: Diagnosis not present

## 2024-01-24 DIAGNOSIS — I4729 Other ventricular tachycardia: Secondary | ICD-10-CM

## 2024-01-24 DIAGNOSIS — E875 Hyperkalemia: Secondary | ICD-10-CM

## 2024-01-24 DIAGNOSIS — N179 Acute kidney failure, unspecified: Secondary | ICD-10-CM | POA: Diagnosis not present

## 2024-01-24 DIAGNOSIS — I2510 Atherosclerotic heart disease of native coronary artery without angina pectoris: Secondary | ICD-10-CM | POA: Diagnosis not present

## 2024-01-24 DIAGNOSIS — I70222 Atherosclerosis of native arteries of extremities with rest pain, left leg: Secondary | ICD-10-CM | POA: Diagnosis not present

## 2024-01-24 DIAGNOSIS — I9788 Other intraoperative complications of the circulatory system, not elsewhere classified: Secondary | ICD-10-CM

## 2024-01-24 DIAGNOSIS — Z95820 Peripheral vascular angioplasty status with implants and grafts: Secondary | ICD-10-CM

## 2024-01-24 DIAGNOSIS — A419 Sepsis, unspecified organism: Secondary | ICD-10-CM

## 2024-01-24 HISTORY — PX: INSERTION OF DIALYSIS CATHETER: SHX1324

## 2024-01-24 HISTORY — PX: AMPUTATION: SHX166

## 2024-01-24 LAB — POCT I-STAT 7, (LYTES, BLD GAS, ICA,H+H)
Acid-Base Excess: 9 mmol/L — ABNORMAL HIGH (ref 0.0–2.0)
Acid-base deficit: 7 mmol/L — ABNORMAL HIGH (ref 0.0–2.0)
Acid-base deficit: 8 mmol/L — ABNORMAL HIGH (ref 0.0–2.0)
Acid-base deficit: 16 mmol/L — ABNORMAL HIGH (ref 0.0–2.0)
Acid-base deficit: 16 mmol/L — ABNORMAL HIGH (ref 0.0–2.0)
Bicarbonate: 20.2 mmol/L (ref 20.0–28.0)
Bicarbonate: 19.1 mmol/L — ABNORMAL LOW (ref 20.0–28.0)
Bicarbonate: 10.5 mmol/L — ABNORMAL LOW (ref 20.0–28.0)
Bicarbonate: 10.4 mmol/L — ABNORMAL LOW (ref 20.0–28.0)
Bicarbonate: 33.2 mmol/L — ABNORMAL HIGH (ref 20.0–28.0)
Calcium, Ion: 1.26 mmol/L (ref 1.15–1.40)
Calcium, Ion: 1.23 mmol/L (ref 1.15–1.40)
Calcium, Ion: 1.22 mmol/L (ref 1.15–1.40)
Calcium, Ion: 1.26 mmol/L (ref 1.15–1.40)
Calcium, Ion: 0.91 mmol/L — ABNORMAL LOW (ref 1.15–1.40)
HCT: 39 % (ref 39.0–52.0)
HCT: 32 % — ABNORMAL LOW (ref 39.0–52.0)
HCT: 23 % — ABNORMAL LOW (ref 39.0–52.0)
HCT: 26 % — ABNORMAL LOW (ref 39.0–52.0)
HCT: 16 % — ABNORMAL LOW (ref 39.0–52.0)
Hemoglobin: 13.3 g/dL (ref 13.0–17.0)
Hemoglobin: 10.9 g/dL — ABNORMAL LOW (ref 13.0–17.0)
Hemoglobin: 7.8 g/dL — ABNORMAL LOW (ref 13.0–17.0)
Hemoglobin: 8.8 g/dL — ABNORMAL LOW (ref 13.0–17.0)
Hemoglobin: 5.4 g/dL — CL (ref 13.0–17.0)
O2 Saturation: 100 %
O2 Saturation: 100 %
O2 Saturation: 96 %
O2 Saturation: 95 %
O2 Saturation: 100 %
Patient temperature: 36.7
Patient temperature: 37.1
Patient temperature: 98.2
Patient temperature: 98
Potassium: 4.3 mmol/L (ref 3.5–5.1)
Potassium: 4.1 mmol/L (ref 3.5–5.1)
Potassium: 6.1 mmol/L — ABNORMAL HIGH (ref 3.5–5.1)
Potassium: 6.1 mmol/L — ABNORMAL HIGH (ref 3.5–5.1)
Potassium: 7.7 mmol/L (ref 3.5–5.1)
Sodium: 139 mmol/L (ref 135–145)
Sodium: 140 mmol/L (ref 135–145)
Sodium: 135 mmol/L (ref 135–145)
Sodium: 135 mmol/L (ref 135–145)
Sodium: 146 mmol/L — ABNORMAL HIGH (ref 135–145)
TCO2: 22 mmol/L (ref 22–32)
TCO2: 20 mmol/L — ABNORMAL LOW (ref 22–32)
TCO2: 11 mmol/L — ABNORMAL LOW (ref 22–32)
TCO2: 11 mmol/L — ABNORMAL LOW (ref 22–32)
TCO2: 35 mmol/L — ABNORMAL HIGH (ref 22–32)
pCO2 arterial: 45.8 mmHg (ref 32–48)
pCO2 arterial: 43.2 mmHg (ref 32–48)
pCO2 arterial: 25.4 mmHg — ABNORMAL LOW (ref 32–48)
pCO2 arterial: 24.9 mmHg — ABNORMAL LOW (ref 32–48)
pCO2 arterial: 47.8 mmHg (ref 32–48)
pH, Arterial: 7.251 — ABNORMAL LOW (ref 7.35–7.45)
pH, Arterial: 7.253 — ABNORMAL LOW (ref 7.35–7.45)
pH, Arterial: 7.223 — ABNORMAL LOW (ref 7.35–7.45)
pH, Arterial: 7.229 — ABNORMAL LOW (ref 7.35–7.45)
pH, Arterial: 7.45 (ref 7.35–7.45)
pO2, Arterial: 320 mmHg — ABNORMAL HIGH (ref 83–108)
pO2, Arterial: 245 mmHg — ABNORMAL HIGH (ref 83–108)
pO2, Arterial: 95 mmHg (ref 83–108)
pO2, Arterial: 85 mmHg (ref 83–108)
pO2, Arterial: 423 mmHg — ABNORMAL HIGH (ref 83–108)

## 2024-01-24 LAB — HEPATIC FUNCTION PANEL
ALT: 799 U/L — ABNORMAL HIGH (ref 0–44)
AST: 2273 U/L — ABNORMAL HIGH (ref 15–41)
Albumin: 2.7 g/dL — ABNORMAL LOW (ref 3.5–5.0)
Alkaline Phosphatase: 85 U/L (ref 38–126)
Bilirubin, Direct: 0.2 mg/dL (ref 0.0–0.2)
Indirect Bilirubin: 0.6 mg/dL (ref 0.3–0.9)
Total Bilirubin: 0.8 mg/dL (ref 0.0–1.2)
Total Protein: 5.8 g/dL — ABNORMAL LOW (ref 6.5–8.1)

## 2024-01-24 LAB — BASIC METABOLIC PANEL WITH GFR
Anion gap: 17 — ABNORMAL HIGH (ref 5–15)
BUN: 85 mg/dL — ABNORMAL HIGH (ref 8–23)
CO2: 13 mmol/L — ABNORMAL LOW (ref 22–32)
Calcium: 8.8 mg/dL — ABNORMAL LOW (ref 8.9–10.3)
Chloride: 109 mmol/L (ref 98–111)
Creatinine, Ser: 5.2 mg/dL — ABNORMAL HIGH (ref 0.61–1.24)
GFR, Estimated: 11 mL/min — ABNORMAL LOW (ref >60)
Glucose, Bld: 216 mg/dL — ABNORMAL HIGH (ref 70–99)
Potassium: 5.9 mmol/L — ABNORMAL HIGH (ref 3.5–5.1)
Sodium: 139 mmol/L (ref 135–145)

## 2024-01-24 LAB — PHOSPHORUS: Phosphorus: 7.9 mg/dL — ABNORMAL HIGH (ref 2.5–4.6)

## 2024-01-24 LAB — POCT ACTIVATED CLOTTING TIME
Activated Clotting Time: 285 s
Activated Clotting Time: 222 s

## 2024-01-24 LAB — CBC
HCT: 33.3 % — ABNORMAL LOW (ref 39.0–52.0)
Hemoglobin: 11.5 g/dL — ABNORMAL LOW (ref 13.0–17.0)
MCH: 27 pg (ref 26.0–34.0)
MCHC: 34.5 g/dL (ref 30.0–36.0)
MCV: 78.2 fL — ABNORMAL LOW (ref 80.0–100.0)
Platelets: 147 10*3/uL — ABNORMAL LOW (ref 150–400)
RBC: 4.26 MIL/uL (ref 4.22–5.81)
RDW: 15.2 % (ref 11.5–15.5)
WBC: 21.1 10*3/uL — ABNORMAL HIGH (ref 4.0–10.5)
nRBC: 0 % (ref 0.0–0.2)

## 2024-01-24 LAB — PROCALCITONIN: Procalcitonin: 44.98 ng/mL

## 2024-01-24 LAB — GLUCOSE, CAPILLARY
Glucose-Capillary: 201 mg/dL — ABNORMAL HIGH (ref 70–99)
Glucose-Capillary: 241 mg/dL — ABNORMAL HIGH (ref 70–99)
Glucose-Capillary: 153 mg/dL — ABNORMAL HIGH (ref 70–99)

## 2024-01-24 LAB — PREPARE RBC (CROSSMATCH)

## 2024-01-24 LAB — MAGNESIUM: Magnesium: 3.4 mg/dL — ABNORMAL HIGH (ref 1.7–2.4)

## 2024-01-24 LAB — BETA-HYDROXYBUTYRIC ACID: Beta-Hydroxybutyric Acid: 0.18 mmol/L (ref 0.05–0.27)

## 2024-01-24 LAB — LACTIC ACID, PLASMA: Lactic Acid, Venous: 4 mmol/L (ref 0.5–1.9)

## 2024-01-24 LAB — HEPARIN LEVEL (UNFRACTIONATED): Heparin Unfractionated: 1.04 [IU]/mL — ABNORMAL HIGH (ref 0.30–0.70)

## 2024-01-24 LAB — CK: Total CK: >20000 U/L — ABNORMAL HIGH (ref 49–397)

## 2024-01-24 SURGERY — AMPUTATION, ABOVE KNEE
Anesthesia: General | Site: Knee | Laterality: Right

## 2024-01-24 MED ORDER — VASOPRESSIN 20 UNIT/ML IV SOLN
INTRAVENOUS | Status: AC
  Filled 2024-01-24: qty 1

## 2024-01-24 MED ORDER — INSULIN ASPART 100 UNIT/ML IJ SOLN
INTRAMUSCULAR | Status: DC | PRN
  Administered 2024-01-24 (×2): 10 [IU] via SUBCUTANEOUS

## 2024-01-24 MED ORDER — NOREPINEPHRINE 4 MG/250ML-% IV SOLN
INTRAVENOUS | Status: AC
  Filled 2024-01-24: qty 250

## 2024-01-24 MED ORDER — EPINEPHRINE PF 1 MG/ML IJ SOLN
INTRAMUSCULAR | Status: DC | PRN
Start: 2024-01-24 — End: 2024-01-24
  Administered 2024-01-24 (×3): 1 mg via INTRAVENOUS

## 2024-01-24 MED ORDER — PRISMASOL BGK 2/3.5 32-2-3.5 MEQ/L EC SOLN: Status: DC

## 2024-01-24 MED ORDER — NOREPINEPHRINE 4 MG/250ML-% IV SOLN
0.0000 ug/min | INTRAVENOUS | Status: DC
  Administered 2024-01-24: 12 ug/min via INTRAVENOUS
  Administered 2024-01-24: 33 ug/min via INTRAVENOUS

## 2024-01-24 MED ORDER — ETOMIDATE 2 MG/ML IV SOLN
INTRAVENOUS | Status: DC | PRN
  Administered 2024-01-24: 6 mg via INTRAVENOUS

## 2024-01-24 MED ORDER — INSULIN ASPART 100 UNIT/ML IJ SOLN
10.0000 [IU] | Freq: Once | INTRAMUSCULAR | Status: AC
  Administered 2024-01-24: 10 [IU] via INTRAVENOUS

## 2024-01-24 MED ORDER — PROPOFOL 10 MG/ML IV BOLUS
INTRAVENOUS | Status: AC
  Filled 2024-01-24: qty 20

## 2024-01-24 MED ORDER — INSULIN ASPART 100 UNIT/ML IJ SOLN: 0.0000 [IU] | INTRAMUSCULAR | Status: DC | PRN

## 2024-01-24 MED ORDER — CHLORHEXIDINE GLUCONATE 0.12 % MT SOLN: 15.0000 mL | Freq: Once | OROMUCOSAL | Status: AC

## 2024-01-24 MED ORDER — PRISMASOL BGK 4/2.5 32-4-2.5 MEQ/L EC SOLN: Status: DC

## 2024-01-24 MED ORDER — EPINEPHRINE HCL 5 MG/250ML IV SOLN IN NS
INTRAVENOUS | Status: DC | PRN
  Administered 2024-01-24: 10 ug/min via INTRAVENOUS

## 2024-01-24 MED ORDER — HEPARIN SODIUM (PORCINE) 1000 UNIT/ML IJ SOLN
INTRAMUSCULAR | Status: AC
Start: 2024-01-24 — End: 2024-01-24
  Filled 2024-01-24: qty 10

## 2024-01-24 MED ORDER — SODIUM CHLORIDE 0.9 % IV SOLN: 10.0000 mL/h | Freq: Once | INTRAVENOUS | Status: DC

## 2024-01-24 MED ORDER — VANCOMYCIN VARIABLE DOSE PER UNSTABLE RENAL FUNCTION (PHARMACIST DOSING): Status: DC

## 2024-01-24 MED ORDER — SODIUM BICARBONATE 8.4 % IV SOLN
INTRAVENOUS | Status: AC
  Filled 2024-01-24: qty 100

## 2024-01-24 MED ORDER — DEXTROSE 50 % IV SOLN
50.0000 mL | Freq: Once | INTRAVENOUS | Status: AC
  Administered 2024-01-24: 50 mL via INTRAVENOUS
  Filled 2024-01-24: qty 50

## 2024-01-24 MED ORDER — CALCIUM GLUCONATE-NACL 1-0.675 GM/50ML-% IV SOLN
1.0000 g | Freq: Once | INTRAVENOUS | Status: AC
  Administered 2024-01-24: 1000 mg via INTRAVENOUS
  Filled 2024-01-24: qty 50

## 2024-01-24 MED ORDER — ORAL CARE MOUTH RINSE: 15.0000 mL | Freq: Once | OROMUCOSAL | Status: AC

## 2024-01-24 MED ORDER — OXYCODONE-ACETAMINOPHEN 5-325 MG PO TABS: 1.0000 | ORAL_TABLET | ORAL | Status: DC | PRN

## 2024-01-24 MED ORDER — SODIUM BICARBONATE 8.4 % IV SOLN: 100.0000 meq | Freq: Once | INTRAVENOUS | Status: DC

## 2024-01-24 MED ORDER — CHLORHEXIDINE GLUCONATE 0.12 % MT SOLN
OROMUCOSAL | Status: AC
  Administered 2024-01-24: 15 mL via OROMUCOSAL
  Filled 2024-01-24: qty 15

## 2024-01-24 MED ORDER — SODIUM CHLORIDE 0.9 % IV SOLN: INTRAVENOUS | Status: DC

## 2024-01-24 MED ORDER — OXYCODONE HCL 5 MG PO TABS
5.0000 mg | ORAL_TABLET | ORAL | Status: DC | PRN
  Administered 2024-01-24: 5 mg via ORAL
  Filled 2024-01-24: qty 1

## 2024-01-24 MED ORDER — CHLORHEXIDINE GLUCONATE 4 % EX SOLN
CUTANEOUS | Status: AC
  Filled 2024-01-24: qty 90

## 2024-01-24 MED ORDER — VASOPRESSIN 20 UNIT/ML IV SOLN
INTRAVENOUS | Status: AC
Start: 2024-01-24 — End: 2024-01-24
  Filled 2024-01-24: qty 1

## 2024-01-24 MED ORDER — FENTANYL CITRATE (PF) 250 MCG/5ML IJ SOLN
INTRAMUSCULAR | Status: AC
Start: 2024-01-24 — End: 2024-01-24
  Filled 2024-01-24: qty 5

## 2024-01-24 MED ORDER — LACTATED RINGERS IV BOLUS
500.0000 mL | Freq: Once | INTRAVENOUS | Status: AC
  Administered 2024-01-24: 500 mL via INTRAVENOUS

## 2024-01-24 MED ORDER — HEPARIN (PORCINE) 25000 UT/250ML-% IV SOLN
750.0000 [IU]/h | INTRAVENOUS | Status: DC
  Administered 2024-01-24: 750 [IU]/h via INTRAVENOUS

## 2024-01-24 MED ORDER — VANCOMYCIN HCL 1250 MG/250ML IV SOLN
1250.0000 mg | Freq: Once | INTRAVENOUS | Status: AC
  Administered 2024-01-24: 1250 mg via INTRAVENOUS
  Filled 2024-01-24: qty 250

## 2024-01-24 MED ORDER — LACTATED RINGERS IV BOLUS
1000.0000 mL | Freq: Once | INTRAVENOUS | Status: AC
  Administered 2024-01-24: 1000 mL via INTRAVENOUS

## 2024-01-24 MED ORDER — VASOPRESSIN 20 UNIT/ML IV SOLN
INTRAVENOUS | Status: DC | PRN
  Administered 2024-01-24 (×3): 2 [IU] via INTRAVENOUS

## 2024-01-24 MED ORDER — SODIUM BICARBONATE 8.4 % IV SOLN
INTRAVENOUS | Status: DC | PRN
  Administered 2024-01-24 (×3): 50 meq via INTRAVENOUS

## 2024-01-24 MED ORDER — SODIUM CHLORIDE 0.9 % IV SOLN
2.0000 g | INTRAVENOUS | Status: DC
  Administered 2024-01-24: 2 g via INTRAVENOUS
  Filled 2024-01-24: qty 20

## 2024-01-24 MED ORDER — STERILE WATER FOR INJECTION IV SOLN
INTRAVENOUS | Status: DC
  Filled 2024-01-24: qty 1000

## 2024-01-24 MED ORDER — FENTANYL CITRATE PF 50 MCG/ML IJ SOSY: 50.0000 ug | PREFILLED_SYRINGE | Freq: Once | INTRAMUSCULAR | Status: DC

## 2024-01-24 MED ORDER — CALCIUM CHLORIDE 10 % IV SOLN
INTRAVENOUS | Status: DC | PRN
  Administered 2024-01-24: 1 g via INTRAVENOUS

## 2024-01-24 MED ORDER — HEPARIN 6000 UNIT IRRIGATION SOLUTION
Status: AC
  Filled 2024-01-24: qty 500

## 2024-01-24 MED ORDER — HEPARIN SODIUM (PORCINE) 1000 UNIT/ML DIALYSIS: 1000.0000 [IU] | INTRAMUSCULAR | Status: DC | PRN

## 2024-01-24 MED ORDER — DEXTROSE 50 % IV SOLN
INTRAVENOUS | Status: DC | PRN
Start: 2024-01-24 — End: 2024-01-24
  Administered 2024-01-24 (×2): 25 g via INTRAVENOUS

## 2024-01-24 MED ORDER — ROCURONIUM BROMIDE 10 MG/ML (PF) SYRINGE
PREFILLED_SYRINGE | INTRAVENOUS | Status: DC | PRN
  Administered 2024-01-24: 50 mg via INTRAVENOUS

## 2024-01-24 SURGICAL SUPPLY — 49 items
BAG COUNTER SPONGE SURGICOUNT (BAG) ×2 IMPLANT
BANDAGE ESMARK 6X9 LF (GAUZE/BANDAGES/DRESSINGS) ×2 IMPLANT
BIOPATCH RED 1 DISK 7.0 (GAUZE/BANDAGES/DRESSINGS) IMPLANT
BLADE SAGITTAL 25.0X1.19X90 (BLADE) ×2 IMPLANT
BLADE SAW GIGLI 510 (BLADE) ×2 IMPLANT
BNDG COHESIVE 6X5 TAN NS LF (GAUZE/BANDAGES/DRESSINGS) IMPLANT
BNDG COHESIVE 6X5 TAN ST LF (GAUZE/BANDAGES/DRESSINGS) ×2 IMPLANT
BNDG ELASTIC 4X5.8 VLCR STR LF (GAUZE/BANDAGES/DRESSINGS) ×2 IMPLANT
BNDG ELASTIC 6INX 5YD STR LF (GAUZE/BANDAGES/DRESSINGS) ×2 IMPLANT
BNDG GAUZE DERMACEA FLUFF 4 (GAUZE/BANDAGES/DRESSINGS) ×2 IMPLANT
BUR DISC 0.8X25 (BURR) IMPLANT
CANISTER SUCTION 3000ML PPV (SUCTIONS) ×2 IMPLANT
CLIP TI MEDIUM 6 (CLIP) ×2 IMPLANT
CLIP TI WIDE RED SMALL 6 (CLIP) ×2 IMPLANT
COVER SURGICAL LIGHT HANDLE (MISCELLANEOUS) ×2 IMPLANT
CUFF TOURN SGL QUICK 18X4 (TOURNIQUET CUFF) IMPLANT
CUFF TRNQT CYL 24X4X16.5-23 (TOURNIQUET CUFF) IMPLANT
CUFF TRNQT CYL 34X4.125X (TOURNIQUET CUFF) IMPLANT
DRAPE DERMATAC (DRAPES) IMPLANT
DRAPE HALF SHEET 40X57 (DRAPES) ×2 IMPLANT
DRAPE INCISE IOBAN 66X45 STRL (DRAPES) IMPLANT
DRAPE SURG ORHT 6 SPLT 77X108 (DRAPES) ×4 IMPLANT
DRSG ADAPTIC 3X8 NADH LF (GAUZE/BANDAGES/DRESSINGS) ×2 IMPLANT
ELECT CAUTERY BLADE 6.4 (BLADE) ×2 IMPLANT
ELECTRODE REM PT RTRN 9FT ADLT (ELECTROSURGICAL) ×2 IMPLANT
EVACUATOR SILICONE 100CC (DRAIN) IMPLANT
GAUZE SPONGE 4X4 12PLY STRL (GAUZE/BANDAGES/DRESSINGS) ×2 IMPLANT
GLOVE BIOGEL PI IND STRL 7.0 (GLOVE) ×2 IMPLANT
GOWN STRL REUS W/ TWL LRG LVL3 (GOWN DISPOSABLE) ×4 IMPLANT
GOWN STRL REUS W/ TWL XL LVL3 (GOWN DISPOSABLE) ×2 IMPLANT
KIT BASIN OR (CUSTOM PROCEDURE TRAY) ×2 IMPLANT
KIT TURNOVER KIT B (KITS) ×2 IMPLANT
NS IRRIG 1000ML POUR BTL (IV SOLUTION) ×2 IMPLANT
PACK GENERAL/GYN (CUSTOM PROCEDURE TRAY) ×2 IMPLANT
PAD ARMBOARD POSITIONER FOAM (MISCELLANEOUS) ×4 IMPLANT
POWDER SURGICEL 3.0 GRAM (HEMOSTASIS) IMPLANT
RASP HELIOCORDIAL MED (MISCELLANEOUS) IMPLANT
STAPLER SKIN PROX 35W (STAPLE) ×2 IMPLANT
STOCKINETTE IMPERVIOUS LG (DRAPES) ×2 IMPLANT
SUT ETHILON 2 0 PSLX (SUTURE) IMPLANT
SUT SILK 0 TIES 10X30 (SUTURE) ×2 IMPLANT
SUT SILK 2 0 SH CR/8 (SUTURE) IMPLANT
SUT SILK 2-0 18XBRD TIE 12 (SUTURE) ×2 IMPLANT
SUT SILK 3-0 18XBRD TIE 12 (SUTURE) IMPLANT
SUT VIC AB 2-0 CT1 18 (SUTURE) ×4 IMPLANT
TAPE CLOTH SURG 4X10 WHT LF (GAUZE/BANDAGES/DRESSINGS) IMPLANT
TOWEL GREEN STERILE (TOWEL DISPOSABLE) ×4 IMPLANT
UNDERPAD 30X36 HEAVY ABSORB (UNDERPADS AND DIAPERS) ×2 IMPLANT
WATER STERILE IRR 1000ML POUR (IV SOLUTION) ×2 IMPLANT

## 2024-01-26 LAB — BPAM RBC
Blood Product Expiration Date: 202507292359
Blood Product Expiration Date: 202507292359
Unit Type and Rh: 7300
Unit Type and Rh: 7300

## 2024-01-26 LAB — TYPE AND SCREEN
ABO/RH(D): B POS
Antibody Screen: NEGATIVE
Unit division: 0
Unit division: 0

## 2024-01-26 NOTE — Addendum Note (Signed)
 Addendum  created 01/26/24 0854 by Leopoldo Bruckner, MD   Attestation recorded in Intraprocedure, Intraprocedure Attestations filed

## 2024-01-26 NOTE — Progress Notes (Signed)
 OT Cancellation Note  Patient Details Name: FRANCISZEK PLATTEN MRN: [id number] DOB: 1945/09/03   Cancelled Treatment:    Reason Eval/Treat Not Completed: Patient at procedure or test/ unavailable.   Elma JONETTA Lebron FREDERICK, OTR/L [facility] Acute Rehabilitation Office: [phone]   Elma JONETTA Lebron 01/26/2024, 6:19 AM

## 2024-01-28 ENCOUNTER — Encounter (HOSPITAL_COMMUNITY): Payer: Self-pay | Admitting: Vascular Surgery

## 2024-01-28 LAB — SURGICAL PATHOLOGY

## 2024-01-29 NOTE — Progress Notes (Signed)
 OT Cancellation Note  Patient Details Name: Steven [name] MRN: [id number] DOB: Aug 31, 1945   Cancelled Treatment:    Reason Eval/Treat Not Completed: Patient at procedure or test/ unavailable (Pt in surgery. Will follow up on a later date.)  [facility] 01/29/2024, 7:54 AM Kreg Sink, OT/L   Acute OT Clinical Specialist Acute Rehabilitation Services Pager [phone] Office [phone]

## 2024-01-31 NOTE — Progress Notes (Signed)
 Called patient and spouse answered and let us  know that he has passed.

## 2024-02-07 NOTE — Telephone Encounter (Addendum)
 Per previous note 7/10 from front staff patient is deceased. Please advise

## 2024-02-08 ENCOUNTER — Other Ambulatory Visit (HOSPITAL_BASED_OUTPATIENT_CLINIC_OR_DEPARTMENT_OTHER): Payer: Self-pay

## 2024-02-22 NOTE — Plan of Care (Signed)

## 2024-02-22 NOTE — Consult Note (Addendum)
 [facility] Nephrology Consultation Note  Requesting MD: Dr. Harold, Valinda Reason for consult: AKI, Hyperkalemia  HPI:  Steven [name] is a 78 y.o. male with past medical history significant for hypertension, HLD, BPH, CAD, CKD 3B, A-fib, PAD status post right femoral-popliteal bypass who was presented to ER on 7/1 with a concern of ischemic left lower extremity, seen as a consultation for the evaluation of AKI and hyperkalemia. The patient has underlying CKD 3B with baseline creatinine level around 1.7-2.0.  He presented with left lower extremity pain associated with the coldness and loss of sensation for about a week.  He was seen by vascular surgeon underwent arterial duplex demonstrated significant thrombus within the left femoral artery and minimal blood flow.  He was started on IV heparin  for acute left leg ischemia.  He was taken to the OR on 7/1 for a left common femoral and iliac thrombectomy, endarterectomy and profundoplasty.  Patient also had 4 compartment hysterectomy of the left lower extremities.  In the OR there was difficulty placing a Foley catheter therefore urologist was consulted.  Patient underwent cystoscopy with urethral balloon dilatation and complex catheter placement over the wire.  Postoperatively patient was transferred to ICU for further management. On admission, the creatinine was around 3.0 which was dropped to 2.59 on 7/12.  Today the creatinine level elevated to 5.2 associated with hyperkalemia.  Urine output is documented around 670 cc.  Started on Levophed for hypotension and currently on Levophed 12 mics.  Also receiving ceftriaxone and vancomycin for left leg infection possibly osteomyelitis. Patient is currently alert awake and he was accompanied by his wife at the bedside.  He reports fatigue tired and pain on his left leg.  The vascular surgeon is planning to take him to the OR for left AKA.   PMHx:   Past Medical History:  Diagnosis Date    Allergy    Arthritis    Right Hip   Atrial fibrillation ([name])    BCC (basal cell carcinoma of skin)    BPH (benign prostatic hyperplasia)    COPD (chronic obstructive pulmonary disease) ([name])    Coronary artery disease    Lexiscan  Myoview 7/14:  Inf scar with minimal reversibility, inf HK, EF 45%;  Low risk.  Promus LAD stent placed in [place].  100% RCA   Diabetes 1.5, managed as type 1 ([name])    Diverticulosis    on CT   Dyslipidemia    GERD (gastroesophageal reflux disease)    has used Zantac in the past    Glaucoma    Hammer toes, bilateral    Hernia    Hyperlipidemia    Hypertension    Peripheral vascular disease ([name])    a. s/p R fem-pop BPG 01/2013 (Dr. 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    Past Surgical History:  Procedure Laterality Date   ABDOMINAL AORTAGRAM N/A 12/10/2012   Procedure: ABDOMINAL EZELLA;  Surgeon: Gaile LELON New, MD;  Location: [facility] CATH LAB;  Service: Cardiovascular;  Laterality: N/A;   CARDIAC CATHETERIZATION  12/05/2010   with stent placement   CYSTOSCOPY N/A 01/22/2024   Procedure: CYSTOSCOPY WITH BALLOON DILATION DEVICE AND COMPLEX FOLEY INSERTION;  Surgeon: Carolee Sherwood JONETTA DOUGLAS, MD;  Location: [facility] OR;  Service: Urology;  Laterality: N/A;   ENDARTERECTOMY FEMORAL Left 01/22/2024   Procedure: ENDARTERECTOMY, FEMORAL LEFT;  Surgeon: Pearline Norman RAMAN, MD;  Location: [facility] OR;  Service: Vascular;  Laterality: Left;   FASCIECTOMY, LOWER EXTREMITY Left 01/22/2024   Procedure: LEFT  LOWER EXTREMITY FASCIOTOMIES - FOUR COMPARTMENT;  Surgeon: Pearline Norman RAMAN, MD;  Location: [facility] OR;  Service: Vascular;  Laterality: Left;   FEMORAL-POPLITEAL BYPASS GRAFT Right 02/07/2013   Procedure: BYPASS GRAFT FEMORAL-POPLITEAL ARTERY- RIGHT;  Surgeon: Lynwood JONETTA Collum, MD;  Location: [facility] OR;  Service: Vascular;  Laterality: Right;  Right femoral to below knee popliteal bypass using right non reversed great saphenous vein   INGUINAL  HERNIA REPAIR  2010   Right   INGUINAL HERNIA REPAIR  09/11/2018   INSERTION OF ILIAC STENT Left 01/22/2024   Procedure: INSERTION, STENT LEFT EXTERNAL AND COMMON ILIAC ARTERIES USING VBX X AND INOVA X ;  Surgeon: Pearline Norman RAMAN, MD;  Location: [facility] OR;  Service: Vascular;  Laterality: Left;   INTRAOPERATIVE ARTERIOGRAM Right 02/07/2013   Procedure: Attempted INTRA OPERATIVE ARTERIOGRAM;  Surgeon: Lynwood JONETTA Collum, MD;  Location: [facility] OR;  Service: Vascular;  Laterality: Right;   INTRAOPERATIVE ARTERIOGRAM Left 01/22/2024   Procedure: INTRA OPERATIVE ANGIOGRAM;  Surgeon: Pearline Norman RAMAN, MD;  Location: [facility] OR;  Service: Vascular;  Laterality: Left;   IR RETROGRADE PYELOGRAM  12/10/2019   PATCH ANGIOPLASTY Left 01/22/2024   Procedure: ANGIOPLASTY, USING XENOSURE 14CM BIOLOGIC PATCH GRAFT;  Surgeon: Pearline Norman RAMAN, MD;  Location: [facility] OR;  Service: Vascular;  Laterality: Left;   THROMBECTOMY FEMORAL ARTERY Left 01/22/2024   Procedure: LEFT LEG THROMBECTOMY, ARTERY, FEMORAL;  Surgeon: Pearline Norman RAMAN, MD;  Location: [facility] OR;  Service: Vascular;  Laterality: Left;   TRANSFORAMINAL LUMBAR INTERBODY FUSION (TLIF) WITH PEDICLE SCREW FIXATION 1 LEVEL Right 04/12/2023   Procedure: RIGHT-SIDED LUMBAR 4- LUMBAR 5 TRANSFORAMINAL LUMBAR INTERBODY FUSION AND DECOMPRESSION WITH INSTRUMENTATION AND ALLOGRAFT;  Surgeon: Beuford Anes, MD;  Location: [facility] OR;  Service: Orthopedics;  Laterality: Right;   TRANSURETHRAL RESECTION OF PROSTATE  12/10/2019    Family Hx:  Family History  Problem Relation Age of Onset   Diabetes Mother    Hypertension Mother    Heart disease Mother    Hyperlipidemia Mother    Diabetes Father    Heart disease Father    Hyperlipidemia Father    Hypertension Father    Diabetes Sister    Heart disease Sister    Hyperlipidemia Sister    Hypertension Sister    Diabetes Brother    Heart disease Brother    Hyperlipidemia Brother    Hypertension Brother    Diabetes Son     Heart disease Son    Hyperlipidemia Son    Hypertension Son    Pancreatic cancer Son    Alcohol  abuse Neg Hx    Cancer Neg Hx    Depression Neg Hx    Early death Neg Hx    Kidney disease Neg Hx    Stroke Neg Hx    Colon cancer Neg Hx    Esophageal cancer Neg Hx    Rectal cancer Neg Hx    Stomach cancer Neg Hx    Liver cancer Neg Hx     Social History:  reports that he quit smoking about 12 years ago. His smoking use included cigarettes. He started smoking about 63 years ago. He has a 15 pack-year smoking history. He has never been exposed to tobacco smoke. He has never used smokeless tobacco. He reports that he does not currently use alcohol . He reports that he does not use drugs.  Allergies: No Known Allergies  Medications:  Prior to Admission medications   Medication Sig Start Date End Date Taking? Authorizing Provider  amLODipine  (NORVASC ) 10 MG tablet Take 1 tablet (10 mg total) by mouth daily. 11/28/23  Yes   aspirin  EC 81 MG tablet Take 81 mg by mouth daily. Swallow whole.   Yes [provider]  Azelastine  HCl 137 MCG/SPRAY SOLN Place 2 sprays into both nostrils 2 (two) times daily. 11/05/23  Yes   clindamycin  (CLEOCIN  T) 1 % lotion Apply to affected area of face twice daily. 11/06/23  Yes   cyclobenzaprine  (FLEXERIL ) 5 MG tablet TAKE 1-2 TABLETS BY MOUTH AT BEDTIME AS NEEDED 01/11/24  Yes   doxycycline  (VIBRAMYCIN ) 100 MG capsule Take 1 capsule (100 mg total) by mouth 2 (two) times daily with food and water to avoid stomach upset. 01/09/24  Yes   DULoxetine  (CYMBALTA ) 30 MG capsule Take 1 capsule (30 mg total) by mouth daily. 06/18/23  Yes Paz, Aloysius BRAVO, MD  empagliflozin  (JARDIANCE ) 25 MG TABS tablet Take 1 tablet (25 mg total) by mouth daily. 11/28/23  Yes   ezetimibe  (ZETIA ) 10 MG tablet Take 1 tablet (10 mg total) by mouth daily. 12/27/22  Yes Paz, Jose E, MD  Fluocinolone  Acetonide Scalp (DERMA-SMOOTHE /FS SCALP) 0.01 % OIL Apply 1 application topically to the scalp daily.  02/26/23  Yes   fluticasone  (FLONASE ) 50 MCG/ACT nasal spray Place 2 sprays into both nostrils daily. 06/18/23  Yes Paz, Aloysius BRAVO, MD  fluticasone -salmeterol (ADVAIR ) 100-50 MCG/ACT AEPB Inhale 1 puff into the lungs 2 (two) times daily. 02/15/23  Yes Paz, Aloysius BRAVO, MD  glipiZIDE  (GLUCOTROL  XL) 5 MG 24 hr tablet Take 1 tablet (5 mg total) by mouth daily. 12/04/23  Yes   HYDROcodone -acetaminophen  (NORCO/VICODIN) 5-325 MG tablet Take 1 tablet by mouth every six to eight hours as needed for pain. 01/11/24  Yes   ipratropium (ATROVENT ) 0.06 % nasal spray Place 1 spray into both nostrils 2 (two) times daily. Can also use medication as needed before activities that cause the nose to run, like eating. 11/05/23  Yes   latanoprost  (XALATAN ) 0.005 % ophthalmic solution Place 1 drop into both eyes at bedtime. 12/28/22  Yes   latanoprost  (XALATAN ) 0.005 % ophthalmic solution Place 1 drop into both eyes at bedtime. 08/10/23  Yes   losartan  (COZAAR ) 100 MG tablet Take 1 tablet (100 mg total) by mouth daily. 11/28/23  Yes   metoprolol  succinate (TOPROL -XL) 100 MG 24 hr tablet Take 1.5 tablets (150 mg total) by mouth daily. 12/10/23  Yes   nitroGLYCERIN  (NITROSTAT ) 0.4 MG SL tablet Place 1 tablet (0.4 mg total) under the tongue every 5 (five) minutes as needed for chest pain. 10/26/23  Yes Lavona Agent, MD  rosuvastatin  (CRESTOR ) 40 MG tablet Take 1 tablet (40 mg total) by mouth daily. 12/21/23  Yes Lavona Agent, MD  Semaglutide  (RYBELSUS ) 14 MG TABS Take 1 tablet (14 mg total) by mouth daily before breakfast. 11/28/23  Yes   sildenafil (VIAGRA) 100 MG tablet Take 100 mg by mouth as directed. Every 3 days as needed approx. One hr before sexual activity 11/22/23  Yes [provider]  Continuous Glucose Sensor (FREESTYLE LIBRE 3 PLUS SENSOR) MISC Change sensor every 15 days. 06/18/23   Shamleffer, Ibtehal Jaralla, MD    I have reviewed the patient's current medications.  Labs: Renal Panel: Recent Labs  Lab 01/22/24 1331  01/22/24 1737 01/22/24 1839 01/23/24 0419 11-Feb-2024 0329 2024/02/11 0618 2024-02-11 0733  NA 134*   < > 140  139 139 135 135  K 4.6   < > 4.1 4.3 5.9* 6.1* 6.1*  CL 100  --   --  110 109  --   --   CO2 19*  --   --  18* 13*  --   --   GLUCOSE 324*  --   --  171* 216*  --   --   BUN 69*  --   --  55* 85*  --   --   CREATININE 3.00*  --   --  2.59* 5.20*  --   --   CALCIUM  9.9  --   --  8.4* 8.8*  --   --   MG  --   --   --  2.7* 3.4*  --   --   PHOS  --   --   --  4.5 7.9*  --   --    < > = values in this interval not displayed.     CBC:    Latest Ref Rng & Units 28-Jan-2024    7:33 AM Jan 28, 2024    6:18 AM 2024-01-28    3:29 AM  CBC  WBC 4.0 - 10.5 K/uL   21.1   Hemoglobin 13.0 - 17.0 g/dL 8.8  7.8  88.4   Hematocrit 39.0 - 52.0 % 26.0  23.0  33.3   Platelets 150 - 400 K/uL   147      Anemia Panel:  Recent Labs    01/23/24 0114 01/23/24 0419 January 28, 2024 0329 01/28/2024 0618 28-Jan-2024 0733  HGB 10.6* 10.9* 11.5* 7.8* 8.8*  MCV 77.7* 77.5* 78.2*  --   --     Recent Labs  Lab 01/22/24 1331 Jan 28, 2024 0329  AST 171* 2,273*  ALT 67* 799*  ALKPHOS 91 85  BILITOT 0.6 0.8  PROT 7.5 5.8*  ALBUMIN 3.8 2.7*    Lab Results  Component Value Date   HGBA1C 7.9 (H) 01/22/2024    ROS:  Pertinent items noted in HPI and remainder of comprehensive ROS otherwise negative.  Physical Exam: Vitals:   January 28, 2024 0830 2024-01-28 0845  BP: (!) 122/56 107/61  Pulse: (!) 115 (!) 116  Resp: (!) 36 (!) 44  Temp:    SpO2: 100% 100%     General exam: Appears calm and comfortable  Respiratory system: Clear to auscultation. Respiratory effort normal. No wheezing or crackle Cardiovascular system: S1 & S2 heard, RRR.  No pedal edema. Gastrointestinal system: Abdomen is nondistended, soft and nontender. Normal bowel sounds heard. Central nervous system: Alert and oriented.  Extremities: Left leg has dressing applied.. Skin: No rashes, lesions or ulcers Psychiatry: Judgement and insight appear  normal. Mood & affect appropriate.   Assessment/Plan:  # Acute kidney injury on CKD stage IIIb: b/l cr around 1.7-2.0, likely due to septic shock, rhabdomyolysis and may have bladder outlet obstruction.  Foley catheter was placed by urologist in OR.  Overnight the urine output is dropping with ongoing need of Levophed for hypotension.  On broad-spectrum antibiotics. We will initiate CRRT after placement of HD catheter.  Patient will likely go to the OR before then.  Hyperkalemia is being managed medically.  Discussed with the patient and his wife including ICU team. Strict ins and outs and daily lab.  # Hyperkalemia: Received insulin , dextrose , calcium  and sodium bicarbonate.  Currently NPO.  Starting CRRT.  # Ischemic left leg: Status post vascular surgery and now going for left AKA.  # Septic shock: Currently on broad-spectrum antibiotics vancomycin  and ceftriaxone and on Levophed.  # Anion gap metabolic acidosis: On sodium bicarbonate, treating septic shock and starting CRRT.  Thank you for the consult.  We will continue to follow.  [place] Amelie Romney Feb 16, 2024, 9:27 AM  [employer].

## 2024-02-22 NOTE — Anesthesia Postprocedure Evaluation (Addendum)
 Anesthesia Post Note  Patient: Steven [name]  Procedure(s) Performed: AMPUTATION, ABOVE KNEE (Left: Knee)     Patient location during evaluation: PACU Anesthesia Type: General Post-procedure mental status: Deceased. Anesthetic complications: no                Bertrum Helmstetter,W. EDMOND

## 2024-02-22 NOTE — Progress Notes (Addendum)
 eLink Physician-Brief Progress Note Patient Name: Steven [name] DOB: 1945-12-05 MRN: [id number]   Date of Service  2024-01-29  HPI/Events of Note  78 year old male with A-fib, coronary artery disease, metabolic syndrome, and PAD status post right femoropopliteal in 2014 presented on 7/1 with critical limb ischemia of the left lower extremity and underwent left femoral and iliac thrombectomy with endarterectomy with stenting. In the interim, patient required urology guided cystoscopy and balloon dilation with Foley placement. Admitted to ICU for postoperative care.   Progressively increased norepinephrine requirements up to 7 mcg nightly.  Persistent tachycardia and fever lactic acid with some elevated.  Increased confusion.    eICU Interventions  Vital signs relatively stable except tachypnea  Will attempt 500 cc LR bolus  Trend lactic acid tomorrow, leukocytosis noted  Received prophylactic cefazolin , initiate empiric vancomycin/ceftriaxone until more definitive operative plan.  Obtain cultures  Discontinue morphine  in the setting of kidney injury   0632 -add on CK, LFTs, and beta hydroxybutyrate.  Vascular surgery added hyperkalemia protocol and additional fluids.  Heparin  resumed.  Recheck lactic acid in the a.m.  Worsening acidemia could be multifactorial-worsening azotemia, lactic acidosis.  May need to consider RRT this morning.  Start bicarb infusion  Intervention Category Minor Interventions: Clinical assessment - ordering diagnostic tests  Wallice Granville 01-29-2024, 4:31 AM

## 2024-02-22 NOTE — Addendum Note (Signed)
 Addendum  created 27-Jan-2024 1237 by Epifanio Charleston, MD   Attestation recorded in Intraprocedure, Intraprocedure Attestations filed

## 2024-02-22 NOTE — Death Summary Note (Signed)
 DEATH SUMMARY   Patient Details  Name: Steven [name] MRN: [id number] DOB: 09-Feb-1946  Admission/Discharge Information   Admit Date:  01-25-24  Date of Death: Date of Death: Jan 27, 2024  Time of Death: Time of Death: 01-30-34  Length of Stay: 2  Referring Physician: Latisha Ronal Crank, PA-C   Reason(s) for Hospitalization  Severe peripheral arterial disease with critical left lower extremity limb ischemia status post common femoral, iliac thrombectomy, endarterectomy, profundoplasty, left common iliac artery stenting and 4 compartment fasciotomy Acute rhabdomyolysis due to compartment syndrome AKI on CKD stage IIIa, worsening Acute metabolic acidosis Hyperkalemia Poorly controlled diabetes with hyperglycemia Paroxysmal A-fib Coronary artery disease Shock in the setting of acidosis versus sepsis Elevated LFTs likely due to acute rhabdomyolysis  Diagnoses  Preliminary cause of death: Critical limb ischemia, Acute renal failure with severe hyperkalemia Secondary Diagnoses (including complications and co-morbidities):  Principal Problem:   Critical limb ischemia of left lower extremity ([name]) Active Problems:   Acute renal failure superimposed on chronic kidney disease ([name])   Ischemic leg   Hypotension   Brief Hospital Course (including significant findings, care, treatment, and services provided and events leading to death)  Steven [name] is a 78 y.o. year old male  who has a PMH as outlined below including but not limited to A-fib, CAD, HLD, HTN, PAD s/p right femoral-popliteal bypass in 2013-01-30, COPD.  He presented to [facility] ED on Jan 25, 2024 with concerns for an ischemic left lower extremity.  He had been having significant pain for about 2 weeks that had worsened since onset and changed from intermittent to constant.  Pain was associated with coolness to the touch of the extremity as well as loss of sensation about 1 week ago followed by inability to move the foot a few days prior to  admit. He was evaluated by vascular surgery who performed a arterial duplex which demonstrated significant thrombus within the left femoral artery and minimal trickle of blood flow.  He apparently has a known left SFA occlusion. Concern for acute left leg ischemia, he was taken to the OR for left common femoral and iliac thrombectomy, endarterectomy and profundoplasty, left common iliac stenting, aortogram and pelvic angiogram, 4 compartment fasciotomy of the left lower extremity.  While in the OR there was also difficulty facing a Foley catheter therefore urology was consulted for cystoscopy with urethral balloon dilation and complex catheter placement over a wire.   Postoperatively, he was transferred to the cardiovascular ICU a PCCM was called in consultation for assistance with medical management.  On Jan 27, 2024 patient's vasopressor requirement got worse, he was on 10 mics of Levophed, with worsening AKI and hyperkalemia, nephrology was consulted, plan was to start CRRT after patient comes from the OR after left AKA considering he was developing acute rhabdomyolysis in the setting of critical limb ischemia.  Patient went to the OR, his serum potassium was 7.5, after induction of anesthesia he went into cardiac arrest,, briefly ROSC achieved couple of times but then he went back to V. tach V-fib and ultimately PEA.  Patient was pronounced dead in the operating room around 12:35 PM   Pertinent Labs and Studies  Significant Diagnostic Studies DG Chest Port 1 View Result Date: 01-27-24 CLINICAL DATA:  Shortness of breath. EXAM: PORTABLE CHEST 1 VIEW COMPARISON:  Chest radiograph dated 07/29/2014. FINDINGS: Right IJ central venous line with tip at the cavoatrial junction. No focal consolidation, pleural effusion or pneumothorax. The cardiac silhouette is within normal limits. No acute osseous pathology. IMPRESSION:  No active disease. Electronically Signed   By: Vanetta Chou M.D.   On: 02/10/2024 10:21    DG C-Arm 1-60 Min Result Date: 01/22/2024 CLINICAL DATA:  Intraoperative angiogram EXAM: DG C-ARM 1-60 MIN FLUOROSCOPY: Fluoroscopy Time:  6 minutes Radiation Exposure Index (if provided by the fluoroscopic device): 42.6 mGy COMPARISON:  None Available. FINDINGS: Intraoperative angiogram see procedure report for details. IMPRESSION: Intraoperative angiogram see procedure report for details. Electronically Signed   By: Norman Gatlin M.D.   On: 01/22/2024 22:40    Microbiology Recent Results (from the past 240 hours)  Surgical pcr screen     Status: None   Collection Time: 01/22/24  3:47 PM   Specimen: Vein; Nasal Swab  Result Value Ref Range Status   MRSA, PCR NEGATIVE NEGATIVE Final   Staphylococcus aureus NEGATIVE NEGATIVE Final    Comment: (NOTE) The Xpert SA Assay (FDA approved for NASAL specimens in patients 18 years of age and older), is one component of a comprehensive surveillance program. It is not intended to diagnose infection nor to guide or monitor treatment. Performed at [facility], [address], [place], [place] [address]     Lab Basic Metabolic Panel: Recent Labs  Lab 01/22/24 1331 01/22/24 1737 01/23/24 0419 02/10/2024 0329 02/10/24 0618 2024-02-10 0733 2024-02-10 1236  NA 134*   < > 139 139 135 135 146*  K 4.6   < > 4.3 5.9* 6.1* 6.1* 7.7*  CL 100  --  110 109  --   --   --   CO2 19*  --  18* 13*  --   --   --   GLUCOSE 324*  --  171* 216*  --   --   --   BUN 69*  --  55* 85*  --   --   --   CREATININE 3.00*  --  2.59* 5.20*  --   --   --   CALCIUM  9.9  --  8.4* 8.8*  --   --   --   MG  --   --  2.7* 3.4*  --   --   --   PHOS  --   --  4.5 7.9*  --   --   --    < > = values in this interval not displayed.   Liver Function Tests: Recent Labs  Lab 01/22/24 1331 02-10-2024 0329  AST 171* 2,273*  ALT 67* 799*  ALKPHOS 91 85  BILITOT 0.6 0.8  PROT 7.5 5.8*  ALBUMIN 3.8 2.7*   No results for input(s): LIPASE, AMYLASE in the last 168  hours. No results for input(s): AMMONIA in the last 168 hours. CBC: Recent Labs  Lab 01/22/24 1331 01/22/24 1737 01/23/24 0114 01/23/24 0419 2024/02/10 0329 2024/02/10 0618 2024/02/10 0733 02-10-2024 1236  WBC 15.0*  --  16.5* 17.1* 21.1*  --   --   --   NEUTROABS 13.1*  --   --   --   --   --   --   --   HGB 16.1   < > 10.6* 10.9* 11.5* 7.8* 8.8* 5.4*  HCT 46.3   < > 30.6* 31.4* 33.3* 23.0* 26.0* 16.0*  MCV 76.8*  --  77.7* 77.5* 78.2*  --   --   --   PLT 200  --  146* 162 147*  --   --   --    < > = values in this interval not displayed.   Cardiac  Enzymes: Recent Labs  Lab January 31, 2024 0329  CKTOTAL >20,000*   Sepsis Labs: Recent Labs  Lab 01/22/24 1331 01/22/24 2140 01/23/24 0114 01/23/24 0419 01-31-2024 0329 2024/01/31 1009  PROCALCITON  --   --   --   --   --  44.98  WBC 15.0*  --  16.5* 17.1* 21.1*  --   LATICACIDVEN 2.9* 1.4  --   --  4.0*  --     Procedures/Operations     Umaima Scholten Jan 31, 2024, 4:23 PM

## 2024-02-22 NOTE — Progress Notes (Signed)
 PHARMACY - ANTICOAGULATION CONSULT NOTE  Pharmacy Consult for heparin  Indication: ischemic limb  Labs: Recent Labs    01/22/24 1331 01/23/24 0114 01/23/24 0419 01/23/24 1700 01/29/2024 0329  HGB 16.1 10.6* 10.9*  --  11.5*  HCT 46.3 30.6* 31.4*  --  33.3*  PLT 200 146* 162  --  147*  HEPARINUNFRC  --   --   --  0.53 1.04*  CREATININE 3.00*  --  2.59*  --  5.20*   Assessment: 77yo male supratherapeutic on heparin  after one level at goal; no infusion issues or signs of bleeding per RN.  Goal of Therapy:  Heparin  level 0.3-0.7 units/ml   Plan:  Hold heparin  infusion x1h then Decrease heparin  infusion by 4 units/kg/hr to 750 units/hr. Check level in 8 hours.   Marvetta Dauphin, PharmD, BCPS Jan 29, 2024 4:48 AM

## 2024-02-22 NOTE — Progress Notes (Signed)
 Pt had cardiac arrest shortly after induction of anesthesia. This was not surprising as the pre-op potassium is 7.7 and a pH of 6.7. He is unresponsive. He is not a candidate for dialysis as he is on extremely high dose of Norepinephrine and his BP would not support dialysis. His leg is necrotic and is the source of his sepsis. His only chance of survival is amputation. After cardiac arrest, multiple rounds of CPR were performed with ROSC on at least two occasions. Unfortunately, his rhythm would always revert back to VT/VF. A detailed account of our attempts in the OR can be found in the anesthesia record.Ultimately the decision was made to stop CPR and the patient was pronounced dead in the operating room.

## 2024-02-22 NOTE — Op Note (Signed)
 OPERATIVE NOTE  PROCEDURE:   Left guillotine through knee amputation Right femoral temporary dialysis catheter placement  PRE-OPERATIVE DIAGNOSIS: Rutherford 3 limb ischemia with hyperkalemia and shock  POST-OPERATIVE DIAGNOSIS: same as above   SURGEON: Norman GORMAN Serve MD  ASSISTANT(S): Malvina New, MD; Lucie Motto, PA  ANESTHESIA: general  ESTIMATED BLOOD LOSS: approximately 200 cc (within lower leg)  FINDING(S): A few minutes after induction of general anesthesia the patient's rhythm strip was switched into ventricular tachycardia and he became unstable.  A tourniquet was placed on the left thigh and CPR was started.  2 rounds of chest compressions were administered and a shock was delivered which stabilized his rhythm and regained an arterial waveform on the art line.  At that time we elected to proceed with the through knee guillotine amputation and right femoral tunneled dialysis catheter placement.  The amputation was completed and the wound was dressed with a sterile dressing and the right femoral tunnel dialysis catheter was sutured in place.  Unfortunately during this timeframe his rhythm became unstable multiple times and had undergone multiple rounds of chest compressions and defibrillations without spontaneous return of circulation or a stable rhythm.  Ultimately the decision was made to stop CPR and the patient was pronounced deceased in the operating room.  SPECIMEN(S): Right lower leg  INDICATIONS:   Steven [name] is a 78 y.o. male who presented on the evening of 01/22/2024 with Rutherford 2B acute limb ischemia.  He had approximately 2 to 3 weeks of symptoms with a motor loss over the last 2 to 3 days.  Myself and my partner Dr. New explained that his foot and lower leg are likely unsalvageable at this time although given his thigh mottling with occlusion of his left iliac system and common femoral thought would be appropriate to perform revascularization with a  staged left above-knee amputation.  On postop day 1 he was doing reasonably well with a decrease in his creatinine and a normal potassium.  Overnight last night he began to have hypotension and hyperkalemia.  This morning his potassium was 5.9 and a BMP, this was treated medically with insulin  and dextrose  and he was planned for above-knee amputation.  Throughout the morning prior to the operating room his clinical condition continued to deteriorate requiring larger doses of norepinephrine and began having mental status changes as well.  I discussed with the anesthesiologist, Dr. Epifanio that at this point there is no time for CRRT and he would need the leg removed in order to have the best chances of survival.  We explained to the patient and the wife that given his significant hyperkalemia which was now over 7 that he could die intraoperatively due to an arrhythmia.  She expressed understanding and elected to proceed with left leg amputation and dialysis catheter insertion.  DESCRIPTION: The patient was brought to the operating room and positioned supine on operating room table.  General anesthesia was induced and prior to prepping the left leg the rhythm became ventricular tachycardia and he was unstable.  2 rounds of chest compressions were performed and a shock was delivered, ROSC was obtained although he was on a significant amount of pressor support.  At this time we elected to proceed.  The left leg and bilateral groins were emergently prepped and draped.  A tourniquet was applied to the left thigh and inflated to 250 mmHg. A circumferential incision was made just proximal to the patella.  This was carried deeper with sharp dissection through  the patellar tendon and the joint space and the lower leg was removed sharply at the knee joint.  The popliteal artery and vein were then clamped with a Kelly clamp and ligated with a 2-0 silk suture ligature.  The tourniquet was let down and there was no bleeding  noted from the ligation.  Hemostasis was obtained of some small muscle bleeders with electrocautery and I then placed 2 nylon sutures through the skin and approximating the anterior and posterior edges in order to prevent skin around retracting which would lead to a higher level definitive amputation in the future.  The wound was then dressed with a sterile dressing and wrapped in Coban.  It should be noted that during this time he had flipped back into unstable rhythms and CPR was being performed.  During this time my partner Dr. Serene placed a right femoral dialysis catheter in order to obtain access should he survive and be transferred to the ICU.  The right femoral vein was accessed under ultrasound guidance and the 035 wire was placed into the vein.  This access was then serially dilated with the dilators and the HD catheter was easily tracked into the access over the wire.  This was sutured in place with nylon sutures.  Despite multiple rounds of CPR and shocks we were unable to obtain spontaneous circulation or stable revealed and therefore the decision was made to stop CPR and the patient was pronounced deceased in the operating room.    COMPLICATIONS: Patient died of fatal arrhythmia intraoperatively due to hyperkalemia which resulted from his severely ischemic and nonviable left lower leg.  Norman GORMAN Serve MD [facility] Phone Number: [phone] 02-22-2024 3:27 PM

## 2024-02-22 NOTE — Anesthesia Procedure Notes (Addendum)
 Procedure Name: Intubation Date/Time: February 23, 2024 12:15 PM  Performed by: Virgil Ee, CRNAPre-anesthesia Checklist: Patient identified, Emergency Drugs available, Suction available, Patient being monitored and Timeout performed Patient Re-evaluated:Patient Re-evaluated prior to induction Oxygen Delivery Method: Circle system utilized Preoxygenation: Pre-oxygenation with 100% oxygen Induction Type: IV induction and Rapid sequence Ventilation: Mask ventilation without difficulty Laryngoscope Size: Mac and 4 Grade View: Grade III Tube type: Oral Number of attempts: 1 Airway Equipment and Method: Stylet Placement Confirmation: ETT inserted through vocal cords under direct vision, positive ETCO2 and breath sounds checked- equal and bilateral Secured at: 22 cm Tube secured with: Tape Dental Injury: Teeth and Oropharynx as per pre-operative assessment

## 2024-02-22 NOTE — Op Note (Signed)
 Subjective  - POD #2  Denies chest pain or abdominal pain   Physical Exam:  Palpable femoral pulse Ischemic changes to left leg below the knee       Assessment/Plan:  POD #2  Overnight, the patient had an acute decline with increasing pressor requirement and decreased urine output.  He was given fluid challenges which temporarily helped.  He was found to be acidotic.  I suspect this is a delayed washout from his ischemic leg.  I discussed with the wife who is at the bedside that he will need to have amputation performed today.  He has elevated potassium which has been treated several times within the past couple of hours.  This will need to improve before taking him to the operating room.  Nephrology consult is recommended as he will likely need dialysis.  Plan to continue with resuscitation and take him to the operating room this morning for removal of the nonviable left leg.  Wells Kortnie Stovall 01/25/2024 9:33 AM --  Vitals:   2024-02-02 1045 02/02/24 1051  BP: (!) 86/62 (!) 134/54  Pulse:  87  Resp: (!) 21 (!) 21  Temp:    SpO2:  98%    Intake/Output Summary (Last 24 hours) at 01/25/2024 0933 Last data filed at 02/02/2024 1000 Gross per 24 hour  Intake 113.69 ml  Output --  Net 113.69 ml     Laboratory CBC    Component Value Date/Time   WBC 21.1 (H) 2024/02/02 0329   HGB 5.4 (LL) 02/02/24 1236   HCT 16.0 (L) 02-02-2024 1236   PLT 147 (L) 02-Feb-2024 0329    BMET    Component Value Date/Time   NA 146 (H) 02-02-24 1236   NA 139 11/25/2021 0000   K 7.7 (HH) 2024-02-02 1236   CL 109 02/02/2024 0329   CO2 13 (L) Feb 02, 2024 0329   GLUCOSE 216 (H) Feb 02, 2024 0329   BUN 85 (H) 2024/02/02 0329   BUN 29 (A) 11/25/2021 0000   CREATININE 5.20 (H) 2024-02-02 0329   CALCIUM  8.8 (L) February 02, 2024 0329   GFRNONAA 11 (L) 2024/02/02 0329   GFRAA 60 06/07/2018 0915    COAG Lab Results  Component Value Date   INR 1.07 02/03/2013   No results found for:  PTT  Antibiotics Anti-infectives (From admission, onward)    Start     Dose/Rate Route Frequency Ordered Stop   02-Feb-2024 0600  [MAR Hold]  cefTRIAXone  (ROCEPHIN ) 2 g in sodium chloride  0.9 % 100 mL IVPB        (MAR Hold since Thu 02-02-24 at 1048.Hold Reason: Transfer to a Procedural area)   2 g 200 mL/hr over 30 Minutes Intravenous Every 24 hours 2024-02-02 0436     2024/02/02 0545  vancomycin  (VANCOREADY) IVPB 1250 mg/250 mL        1,250 mg 166.7 mL/hr over 90 Minutes Intravenous  Once 02/02/2024 0449 2024/02/02 0649   02/02/2024 0449  [MAR Hold]  vancomycin  variable dose per unstable renal function (pharmacist dosing)        (MAR Hold since Thu 2024-02-02 at 1048.Hold Reason: Transfer to a Procedural area)    Does not apply See admin instructions 02-02-24 0449     01/23/24 0000  ceFAZolin  (ANCEF ) IVPB 2g/100 mL premix        2 g 200 mL/hr over 30 Minutes Intravenous Every 8 hours 01/22/24 2026 01/23/24 1020        V. Malvina Serene CLORE, M.D., [facility]  [facility] [place] Office: [phone] Pager:  [phone]

## 2024-02-22 NOTE — Anesthesia Preprocedure Evaluation (Addendum)
 Anesthesia Evaluation  Patient identified by MRN, date of birth, ID band Patient unresponsive    Reviewed: Allergy & Precautions, H&P , NPO status , Patient's Chart, lab work & pertinent test results, reviewed documented beta blocker date and time   Airway Mallampati: Unable to assess       Dental no notable dental hx. (+) Teeth Intact, Dental Advisory Given   Pulmonary COPD,  COPD inhaler, former smoker   Pulmonary exam normal breath sounds clear to auscultation       Cardiovascular hypertension, Pt. on medications and Pt. on home beta blockers + CAD, + Cardiac Stents and + Peripheral Vascular Disease   Rhythm:Regular Rate:Normal     Neuro/Psych negative neurological ROS  negative psych ROS   GI/Hepatic Neg liver ROS,GERD  Medicated,,  Endo/Other  diabetes, Type 2    Renal/GU Renal disease  negative genitourinary   Musculoskeletal  (+) Arthritis , Osteoarthritis,    Abdominal   Peds  Hematology negative hematology ROS (+)   Anesthesia Other Findings   Reproductive/Obstetrics negative OB ROS                              Anesthesia Physical Anesthesia Plan  ASA: 4 and emergent  Anesthesia Plan: General   Post-op Pain Management: Ofirmev  IV (intra-op)*   Induction:   PONV Risk Score and Plan: 3 and Ondansetron , Dexamethasone  and Treatment may vary due to age or medical condition  Airway Management Planned: Oral ETT  Additional Equipment: Arterial line  Intra-op Plan:   Post-operative Plan: Post-operative intubation/ventilation  Informed Consent: I have reviewed the patients History and Physical, chart, labs and discussed the procedure including the risks, benefits and alternatives for the proposed anesthesia with the patient or authorized representative who has indicated his/her understanding and acceptance.     Dental advisory given  Plan Discussed with: CRNA and  Surgeon  Anesthesia Plan Comments: (Pt unresponsive in holding. I placed a brachial arterial line and drew labs which revealed a K+ of 7.7 and a pH of 6.7. I told the patients wife that I did not think the patient had a very good chance of surviving the operating room but his only chance of survival was to remove the dead leg. I spoke with Dr. Pearline previously about dialysis for the patient. He said that he was on so much Norepinephrine that he wouldn't tolerate dialysis. I gave 10 units of insulin  in the holding area to try and drive the potassium down prior to surgery. )         Anesthesia Quick Evaluation

## 2024-02-22 NOTE — Progress Notes (Addendum)
 Progress Note    02/03/2024 6:27 AM 2 Days Post-Op  Subjective:  resting in bed-says he is having pain in his left leg.  Right leg feels ok.  Wife at bedside.    Afebrile HR 100's-110's  120's-150's systolic 100% RA  Gtts:  Norepi  Vitals:   2024/02/03 0445 Feb 03, 2024 0500  BP: (!) 159/68 (!) 164/87  Pulse: (!) 115 (!) 114  Resp: (!) 44 (!) 44  Temp:    SpO2: 96% 100%    Physical Exam: General:  no distress Cardiac:  tachy Lungs:  non labored on RA Incisions:  left groin clean and intact  Left lateral leg   Left medial leg   Extremities:  left leg is cold to touch from knee down to his foot.  Motor is not in tact left foot.  + right PT doppler signal   CBC    Component Value Date/Time   WBC 21.1 (H) 2024/02/03 0329   RBC 4.26 02/03/24 0329   HGB 7.8 (L) 2024/02/03 0618   HCT 23.0 (L) 2024-02-03 0618   PLT 147 (L) 02-03-24 0329   MCV 78.2 (L) 2024-02-03 0329   MCH 27.0 Feb 03, 2024 0329   MCHC 34.5 02/03/24 0329   RDW 15.2 02/03/2024 0329   LYMPHSABS 1.0 01/22/2024 1331   MONOABS 0.8 01/22/2024 1331   EOSABS 0.0 01/22/2024 1331   BASOSABS 0.0 01/22/2024 1331    BMET    Component Value Date/Time   NA 135 02/03/2024 0618   NA 139 11/25/2021 0000   K 6.1 (H) 02-03-24 0618   CL 109 02-03-2024 0329   CO2 13 (L) 02/03/24 0329   GLUCOSE 216 (H) 02/03/2024 0329   BUN 85 (H) 2024/02/03 0329   BUN 29 (A) 11/25/2021 0000   CREATININE 5.20 (H) 02/03/24 0329   CALCIUM  8.8 (L) Feb 03, 2024 0329   GFRNONAA 11 (L) 2024-02-03 0329   GFRAA 60 06/07/2018 0915    INR    Component Value Date/Time   INR 1.07 02/03/2013 1630     Intake/Output Summary (Last 24 hours) at 2024-02-03 9372 Last data filed at 02/03/2024 0400 Gross per 24 hour  Intake 281.25 ml  Output 670 ml  Net -388.75 ml      Assessment/Plan:  78 y.o. male is s/p:  Left common femoral and iliac thrombectomy Left common femoral endarterectomy and profundoplasty Left common iliac  stenting, 8 mm x 79 mm VBX Left external iliac stenting, 8 mm x 100 mm Eluvia, postdilated with a 7 mm balloon Aortogram and pelvic angiogram 4 compartment fasciotomy of left lower extremity  01/22/2024 for acute limb ischemia  2 Days Post-Op   -left groin incision looks good.  Left leg is cold to touch from knee down to his foot and motor is not in tact.  + right PT doppler signal.  Leukocytosis worsening and AKI also worsening with creatinine 5.2 and lactic acid increased to 4 this morning.  He is also requiring increased pressor support.  Fasciotomies as pictured above. Proximal lateral wound with dusky tissue.   Most likely needs to go to OR for left AKA.  Discussed with wife at bedside. Dr. Pearline will be in to see pt this morning.  Pt is npo.  -DVT prophylaxis:  heparin  gtt   Samantha Rhyne, PA-C [facility] [phone] 2024/02/03 6:27 AM   VASCULAR STAFF ADDENDUM: I have independently interviewed and examined the patient. I agree with the above.  Plan for left above-knee amputation, possible guillotine through knee  pending stability in OR. Risk and benefits thoroughly discussed with the patient and his wife.  I explained that his leg is causing renal dysfunction, electrolyte abnormalities as well as his hypotension requiring Levophed.  We discussed that the intact muscle in the lower leg is likely with making him sick and needs to be removed.  We also discussed that given his electrolyte derangements and hypotension that he could also die from general anesthesia although I do not believe there is time to perform CRRT prior to removing the necrotic limb. They expressed understanding and elected to proceed.  Norman GORMAN Serve MD [facility] Phone Number: [phone] 02/20/2024 11:36 AM

## 2024-02-22 NOTE — Progress Notes (Signed)
 Pharmacy Antibiotic Note  Steven [name] is a 78 y.o. male admitted on 01/22/2024 with ischemic limb, now w/ concern for osteomyelitis.  Pharmacy has been consulted for vancomycin dosing.  Pt w/ AKI; baseline SCr ~1.5, 3 on admission, now 5.2.  Plan: Vancomycin 1250mg  IV x1; monitor SCr +/- vanc levels prior to redosing. Also started on ceftriaxone per MD.  Height: 5' 5 (165.1 cm) Weight: 61.7 kg (136 lb) IBW/kg (Calculated) : 61.5  Temp (24hrs), Avg:98.4 F (36.9 C), Min:98.2 F (36.8 C), Max:98.7 F (37.1 C)  Recent Labs  Lab 01/22/24 1331 01/22/24 2140 01/23/24 0114 01/23/24 0419 02-18-24 0329  WBC 15.0*  --  16.5* 17.1* 21.1*  CREATININE 3.00*  --   --  2.59* 5.20*  LATICACIDVEN 2.9* 1.4  --   --  4.0*    Estimated Creatinine Clearance: 10.7 mL/min (A) (by C-G formula based on SCr of 5.2 mg/dL (H)).    No Known Allergies   Thank you for allowing pharmacy to be a part of this patient's care.  Marvetta Dauphin, PharmD, BCPS  02/18/2024 4:50 AM

## 2024-02-22 NOTE — Anesthesia Procedure Notes (Signed)
 Arterial Line Insertion Start/End07/01/2024 11:15 AM, 28-Jan-2024 11:25 AM Performed by: Epifanio Fallow, MD, anesthesiologist  Patient location: Pre-op. Preanesthetic checklist: patient identified, IV checked, site marked, risks and benefits discussed, surgical consent, monitors and equipment checked, pre-op evaluation, timeout performed and anesthesia consent Lidocaine  1% used for infiltration Left, brachial was placed Catheter size: 20 G Hand hygiene performed  and maximum sterile barriers used   Attempts: 1 Procedure performed using ultrasound guided technique. Ultrasound Notes:anatomy identified, needle tip was noted to be adjacent to the nerve/plexus identified, no ultrasound evidence of intravascular and/or intraneural injection and image(s) printed for medical record Following insertion, dressing applied, Biopatch and line sutured. Post procedure assessment: normal and unchanged  Patient tolerated the procedure well with no immediate complications.

## 2024-02-22 NOTE — TOC Progression Note (Signed)
 Transition of Care [facility]) - Progression Note    Patient Details  Name: Steven [name] MRN: [id number] Date of Birth: April 02, 1946  [facility] CM/SW Contact  Justina Delcia Czar, RN Phone Number: [phone] Jan 27, 2024, 1:52 PM  Clinical Narrative:     WONDA CM received call from [facility] IP rehab admission coordinator Orlean, #[phone], requested an EDD, explained will know more in a few days, pt having surgery today. States she will begin insurance auth prior to dc.     Expected Discharge Plan: IP Rehab Facility Barriers to Discharge: Continued Medical Work up  Expected Discharge Plan and Services In-house Referral: NA Discharge Planning Services: CM Consult Post Acute Care Choice: IP Rehab Living arrangements for the past 2 months: Single Family Home                   DME Agency: NA                   Social Determinants of Health (SDOH) Interventions SDOH Screenings   Food Insecurity: Low Risk  (11/28/2023)   Received from [facility]  Housing: Low Risk  (11/28/2023)   Received from [facility]  Transportation Needs: No Transportation Needs (11/28/2023)   Received from [facility]  Utilities: Low Risk  (11/28/2023)   Received from [facility]  Alcohol  Screen: Low Risk  (06/27/2022)  Depression (PHQ2-9): Low Risk  (05/16/2023)  Financial Resource Strain: Low Risk  (06/20/2021)  Physical Activity: Inactive (06/20/2021)  Social Connections: Unknown (11/29/2021)   Received from [facility]  Stress: No Stress Concern Present (06/20/2021)  Tobacco Use: Medium Risk (January 27, 2024)    Readmission Risk Interventions     No data to display

## 2024-02-22 NOTE — Progress Notes (Signed)
 PT Cancellation Note  Patient Details Name: Steven [name] MRN: [id number] DOB: 16-May-1946   Cancelled Treatment:    Reason Eval/Treat Not Completed: Patient not medically ready  Off unit for urgent surgery.  Will follow acutely.  Leontine Roads, PT, DPT [facility]  Rehabilitation Services Physical Therapist Office: [phone] Website: [email or web address]   [name] 02/11/2024, 11:12 AM

## 2024-02-22 NOTE — Transfer of Care (Signed)
 Immediate Anesthesia Transfer of Care Note  Patient: Steven [name]  Procedure(s) Performed: AMPUTATION, ABOVE KNEE (Left: Knee) INSERTION OF TEMPORARY DIALYSIS CATHETER (Right: Groin)  Patient Location:   Anesthesia Type:General  Level of Consciousness: deceased  Airway & Oxygen Therapy: deceased  Post-op Assessment: deceased  Post vital signs: deceased  Last Vitals:  Vitals Value Taken Time  BP    Temp    Pulse    Resp    SpO2      Last Pain:  Vitals:   January 27, 2024 1051  TempSrc:   PainSc: 0-No pain         Complications: No notable events documented.

## 2024-02-22 NOTE — Progress Notes (Addendum)
 NAME:  Steven [name], MRN:  [id number], DOB:  October 17, 1945, LOS: 2 ADMISSION DATE:  01/22/2024, CONSULTATION DATE:  01/22/24 REFERRING MD:  Serene CHIEF COMPLAINT:  Post op management   History of Present Illness:  Steven [name] is a 78 y.o. male who has a PMH as outlined below including but not limited to A-fib, CAD, HLD, HTN, PAD s/p right femoral-popliteal bypass in 2014, COPD.  He presented to [facility] ED on 01/22/2024 with concerns for an ischemic left lower extremity.  He had been having significant pain for about 2 weeks that had worsened since onset and changed from intermittent to constant.  Pain was associated with coolness to the touch of the extremity as well as loss of sensation about 1 week ago followed by inability to move the [name] a few days prior to admit. He was evaluated by vascular surgery who performed a arterial duplex which demonstrated significant thrombus within the left femoral artery and minimal trickle of blood flow.  He apparently has a known left SFA occlusion. Concern for acute left leg ischemia, he was taken to the OR for left common femoral and iliac thrombectomy, endarterectomy and profundoplasty, left common iliac stenting, aortogram and pelvic angiogram, 4 compartment fasciotomy of the left lower extremity.  While in the OR there was also difficulty facing a Foley catheter therefore urology was consulted for cystoscopy with urethral balloon dilation and complex catheter placement over a wire.  Postoperatively, he was transferred to the cardiovascular ICU a PCCM was called in consultation for assistance with medical management.  Pertinent  Medical History:  has Annual physical exam; CAD (coronary artery disease); Benign essential hypertension; GERD (gastroesophageal reflux disease); Allergic rhinitis; Chronic obstructive pulmonary disease ([name]); Chronic renal insufficiency; Seborrheic dermatitis; Atherosclerotic PVD with intermittent claudication ([name]); Hyperlipidemia with  target LDL less than 70; Bronchiectasis ([name]); BPH (benign prostatic hyperplasia); PSA elevation; Pterygium eye, bilateral; Dyslipidemia; Choroidal nevus, left; Inguinal hernia of left side without obstruction or gangrene; Nuclear sclerotic cataract of both eyes; OAG (open angle glaucoma) suspect, high risk, bilateral; PCP NOTES >>>>>>>>>>>>>>; Acute renal failure superimposed on chronic kidney disease ([name]); CKD stage 3b, GFR 30-44 ml/min ([name]); LVH (left ventricular hypertrophy); Type 2 diabetes mellitus with diabetic polyneuropathy, with long-term current use of insulin  ([name]); Type 2 diabetes mellitus with hyperglycemia, with long-term current use of insulin  ([name]); Type 2 diabetes mellitus with stage 3a chronic kidney disease, with long-term current use of insulin  ([name]); Hav (hallux abducto valgus), unspecified laterality; BCC (basal cell carcinoma of skin); Lumbar radiculopathy; Pulmonary nodule 1 cm or greater in diameter; Critical limb ischemia of left lower extremity ([name]); Ischemic leg; and Hypotension on their problem list.  Significant Hospital Events: Including procedures, antibiotic start and stop dates in addition to other pertinent events   7/1 admit, to OR for left common femoral and iliac thrombectomy, endarterectomy and profundoplasty, left common iliac stenting, aortogram and pelvic angiogram, 4 compartment fasciotomy of the left lower extremity. Also had urology consult for cystoscopy with urethral balloon dilation and complex catheter placement over a wire.  Interim History / Subjective:  Remains on 10 levo.  Cool L leg - tentative plan for OR for L AKA.  C/o L leg pain. Otherwise no c/o. Hungry.  Worsening AKI Scr 5.2 Lactate 4 Hyperkalemic - treated this am Objective:  Blood pressure 107/61, pulse (!) 116, temperature 98 F (36.7 C), temperature source Oral, resp. rate (!) 44, height 5' 5 (1.651 m), weight 61.7 kg, SpO2 100%.    FiO2 (%):  [  28 %] 28 %   Intake/Output Summary  (Last 24 hours) at 02-20-24 0900 Last data filed at Feb 20, 2024 0700 Gross per 24 hour  Intake 2173.86 ml  Output 670 ml  Net 1503.86 ml   Filed Weights   01/22/24 1202  Weight: 61.7 kg     Physical Exam: General: acute on chronically ill appearing male, NAD Neuro: awake, alert, appropriate  HEENT: mm moist, no JVD  Cardiovascular: rr s1s2  Lungs: resps even non labored on even unlabored on 2L [place] Abdomen: thin soft  Musculoskeletal: LLE wrapped. Cold LLE knee to [name]    Assessment & Plan:   LLE Limb ischemia  -s/p L common fem and iliac thrombectomy endarterectomy and profundoplasty+ L common iliac stenting + 4 compartment fasciotomy  CK>20K P -VVS managing - tentative plan to OR today for L AKA  -NE to facilitate SBP goal above  -heparin  gtt per pharmacy  -PRN analgesia -PT post op  -pain management   Septic shock  P -gentle volume  -titrate norepi  -trend lactate  -broad spectrum abx - vanc/ceftriaxone 7/3>>>  AKI on CKD - worsening. Baseline Scr ~1.5-1.7 Hyperkalemia  Lactic acidosis - worsening lactate  P -d/w renal they will see  -hyperkalemia treated, repeat chem -anticipate OR for AKA as above  -anticipate dialysis needs at some point - hope acidosis and hyperkalemia will improve with AKA   -HCO3 gtt  Elevated LFT's/ shock liver  P:  D/c statin, tylenol   BP management as above  Gentle volume  F/u LFT's   Leukocytosis Sepsis  P -broad spectrum abx started overnight  -trend lactate  -pct -follow fever curve and white count  Hx HTN CAD  HLD pAF, improved P -holding home antiHTN meds w SBP goal above  -ASA, zetia , statin  Hx COPD. RUL pulm nodule  Dyspnea  -last imaged Feb 2025 and deemed stable from prior Aug 2024 with borderline hypermetabolism Nov 2024. P - Budesonide [place]  - f/u CXR  - supplemental O2 as needed to keep sats >92% - F/u CT as outpatient within the next month or two.  Hx DM. - SSI.  Difficult foley placement  - 2/2 bulbar urethral stricture, s/p OR cystoscopy with urethral balloon dilation and complex catheter placement over wire. - Maintain foley   Best practice (evaluated daily):  Diet/type: NPO - for OR DVT prophylaxis: systemic heparin  - Pressure ulcer(s): pressure ulcer assessment deferred  GI prophylaxis: N/A Lines: Central line and Arterial Line Foley:  Yes, and it is still needed - urology placement  Code Status:  full code Last date of multidisciplinary goals of care discussion: 7/2  Labs   CBC: Recent Labs  Lab 01/22/24 1331 01/22/24 1737 01/23/24 0114 01/23/24 0419 02/20/24 0329 02/20/24 0618 February 20, 2024 0733  WBC 15.0*  --  16.5* 17.1* 21.1*  --   --   NEUTROABS 13.1*  --   --   --   --   --   --   HGB 16.1   < > 10.6* 10.9* 11.5* 7.8* 8.8*  HCT 46.3   < > 30.6* 31.4* 33.3* 23.0* 26.0*  MCV 76.8*  --  77.7* 77.5* 78.2*  --   --   PLT 200  --  146* 162 147*  --   --    < > = values in this interval not displayed.    Basic Metabolic Panel: Recent Labs  Lab 01/22/24 1331 01/22/24 1737 01/22/24 1839 01/23/24 0419 2024/02/20 0329 2024/02/20 0618 02-20-24 0733  NA 134*   < >  140 139 139 135 135  K 4.6   < > 4.1 4.3 5.9* 6.1* 6.1*  CL 100  --   --  110 109  --   --   CO2 19*  --   --  18* 13*  --   --   GLUCOSE 324*  --   --  171* 216*  --   --   BUN 69*  --   --  55* 85*  --   --   CREATININE 3.00*  --   --  2.59* 5.20*  --   --   CALCIUM  9.9  --   --  8.4* 8.8*  --   --   MG  --   --   --  2.7* 3.4*  --   --   PHOS  --   --   --  4.5 7.9*  --   --    < > = values in this interval not displayed.   GFR: Estimated Creatinine Clearance: 10.7 mL/min (A) (by C-G formula based on SCr of 5.2 mg/dL (H)). Recent Labs  Lab 01/22/24 1331 01/22/24 2140 01/23/24 0114 01/23/24 0419 January 27, 2024 0329  WBC 15.0*  --  16.5* 17.1* 21.1*  LATICACIDVEN 2.9* 1.4  --   --  4.0*    Liver Function Tests: Recent Labs  Lab 01/22/24 1331 01-27-2024 0329  AST 171* 2,273*  ALT 67*  799*  ALKPHOS 91 85  BILITOT 0.6 0.8  PROT 7.5 5.8*  ALBUMIN 3.8 2.7*   No results for input(s): LIPASE, AMYLASE in the last 168 hours. No results for input(s): AMMONIA in the last 168 hours.  ABG    Component Value Date/Time   PHART 7.229 (L) 2024/01/27 0733   PCO2ART 24.9 (L) Jan 27, 2024 0733   PO2ART 85 Jan 27, 2024 0733   HCO3 10.4 (L) 01-27-2024 0733   TCO2 11 (L) 01-27-24 0733   ACIDBASEDEF 16.0 (H) 01-27-24 0733   O2SAT 95 Jan 27, 2024 0733     Coagulation Profile: No results for input(s): INR, PROTIME in the last 168 hours.  Cardiac Enzymes: Recent Labs  Lab January 27, 2024 0329  CKTOTAL >20,000*    HbA1C: Hemoglobin A1C  Date/Time Value Ref Range Status  03/08/2023 12:28 PM 6.9 (A) 4.0 - 5.6 % Final   Hgb A1c MFr Bld  Date/Time Value Ref Range Status  01/22/2024 09:40 PM 7.9 (H) 4.8 - 5.6 % Final    Comment:    (NOTE) Diagnosis of Diabetes The following HbA1c ranges recommended by the American Diabetes Association (ADA) may be used as an aid in the diagnosis of diabetes mellitus.  Hemoglobin             Suggested A1C NGSP%              Diagnosis  <5.7                   Non Diabetic  5.7-6.4                Pre-Diabetic  >6.4                   Diabetic  <7.0                   Glycemic control for                       adults with diabetes.    11/28/2022 02:13 PM 8.0 (H) 4.6 - 6.5 % Final  Comment:    Glycemic Control Guidelines for People with Diabetes:Non Diabetic:  <6%Goal of Therapy: <7%Additional Action Suggested:  >8%     CBG: Recent Labs  Lab 01/23/24 1631 01/23/24 1941 01/23/24 2326 February 20, 2024 0338 2024-02-20 0653  GLUCAP 179* 241* 209* 201* 241*    CRITICAL CARE Performed by: Lamarr Myers   Total critical care time: 33 minutes  Critical care time was exclusive of separately billable procedures and treating other patients. Critical care was necessary to treat or prevent imminent or life-threatening  deterioration.  Critical care was time spent personally by me on the following activities: development of treatment plan with patient and/or surrogate as well as nursing, discussions with consultants, evaluation of patient's response to treatment, examination of patient, obtaining history from patient or surrogate, ordering and performing treatments and interventions, ordering and review of laboratory studies, ordering and review of radiographic studies, pulse oximetry and re-evaluation of patient's condition.  Rockie Myers, NP Pulmonary/Critical Care Medicine  02/20/2024  9:00 AM   See Amion for personal pager PCCM on call pager [phone] until 7pm. Please call Elink 7p-7a. [phone]

## 2024-02-22 NOTE — Progress Notes (Signed)
 Ask from anesthesia is for HD before OR to address hyperK  With his pressor req, this would have to be CRRT which would not be as quick to address K as iHD.  D/w nephro VVS -- with his ischemic limb, anticipate that K and acidosis may not correct quickly w CRRT. After further d/w VVS, plan is for urgent OR.  Will defer HD cath placement for now. VVS will place catheter during his case. Anticipate he will still need CRRT post op.       Ronnald Gave MSN, AGACNP-BC [facility] Pulmonary/Critical Care Medicine Jan 27, 2024, 10:02 AM

## 2024-02-22 DEATH — deceased

## 2024-02-25 ENCOUNTER — Ambulatory Visit: Admitting: Cardiology

## 2024-03-03 ENCOUNTER — Other Ambulatory Visit
